# Patient Record
Sex: Female | Born: 1945 | Race: Black or African American | Hispanic: No | Smoking: Never smoker
Health system: Southern US, Community
[De-identification: ages and names within clinical notes are randomized; demographics above are authoritative.]

## PROBLEM LIST (undated history)

## (undated) DIAGNOSIS — E785 Hyperlipidemia, unspecified: Secondary | ICD-10-CM

## (undated) DIAGNOSIS — E039 Hypothyroidism, unspecified: Secondary | ICD-10-CM

## (undated) DIAGNOSIS — M2550 Pain in unspecified joint: Secondary | ICD-10-CM

## (undated) DIAGNOSIS — M1990 Unspecified osteoarthritis, unspecified site: Secondary | ICD-10-CM

## (undated) DIAGNOSIS — K219 Gastro-esophageal reflux disease without esophagitis: Secondary | ICD-10-CM

## (undated) DIAGNOSIS — I10 Essential (primary) hypertension: Secondary | ICD-10-CM

## (undated) DIAGNOSIS — R51 Headache: Secondary | ICD-10-CM

## (undated) DIAGNOSIS — E079 Disorder of thyroid, unspecified: Secondary | ICD-10-CM

## (undated) HISTORY — PX: JOINT REPLACEMENT: SHX530

## (undated) HISTORY — DX: Disorder of thyroid, unspecified: E07.9

## (undated) HISTORY — PX: BREAST BIOPSY: SHX20

## (undated) HISTORY — PX: ABDOMINAL HYSTERECTOMY: SHX81

## (undated) HISTORY — PX: CATARACT EXTRACTION: SUR2

## (undated) HISTORY — PX: EYE SURGERY: SHX253

## (undated) HISTORY — PX: BREAST EXCISIONAL BIOPSY: SUR124

## (undated) NOTE — *Deleted (*Deleted)
[facility]  Name: Teresa Stanley  MRN: [id number] DOB: [birth year]  Chief Complaint/ HPI  Teresa Stanley,  7 y.o. , female presents for their Follow-Up CCM visit with the clinical pharmacist via telephone due to COVID-19 Pandemic.  PCP : Dorothyann Peng, MD  Their chronic conditions include: Hypertension, Diabetes mellitus with CKD  Office Visits: 09/30/19 Telephone call: Pt notified needles from PAP ready for pickup  08/23/19 Nurse visit: BP 148/86, pt states BP was 128/65 at home. Continue current regimen. Hydralazine with breakfast and dinner. Exercise  08/04/19 OV: Started hydralazine 25mg  twice daily for blood pressure. Increase gabapentin to 200mg  nightly. 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   07/18/19 Telephone call: Pt called with weekly BG readings (119,118, 112, 124, 134, 145). Pt's BG has improved and PCP told pt she does not need to call weekly anymore.   07/08/19 Telephone call: Pt called with weekly BG readings (149, 130, 134, 179, 140, 145, 143, 145) on 14 units of [place]. Pt instructed to increase to 16 units daily.   06/16/19 OV: Presented for full physical exam and DM and HTN follow up. Diabetic foot exam performed. Shingles vaccine declined at this time.   03/17/19 AWV and OV: HTN/DM follow up visit. Both conditions are chronic and fairly controlled on current medications. Labs ordered (CMP14+EGFR, lipid panel, HgbA1c). Pt will need to be started on insulin due to increase in A1c.   02/16/19 Televisit: Complains of symptoms concerning for COVID-19 (cough, sinus congestion). COVID test last Saturday was negative. Try loratadine 10mg  daily for postnasal drip.   Consult Visits:  CCM Encounters: 10/05/19 RN: reviewed BP and treatment plan  08/08/19 RN: Reviewed hypertension treatment regimen. Provided patient education  05/04/19 PharmD: Increasing Evaristo Bury to 10 units at bedtime.   04/25/19 RN: Provided patient education regarding chronic disease states  and treatment regimens  04/14/19 PharmD: Jaquita Folds Evaristo Bury to 8 units at bedtime  04/14/19 CPhT: Evaristo Bury approved through [employer] until 02/10/20  03/30/19 CPhT: completed application for Tresiba faxed to [employer]  03/28/19 PharmD: Adding Evaristo Bury 6 units at bedtime due to increase of A1c to 9.6. Add Evaristo Bury to pt assistance and counsel on proper use.   02/23/19 CPhT: Approved for Ozempic through [employer] until 01/10/20  Medications: Outpatient Encounter Medications as of 12/15/2019  Medication Sig  . amLODipine (NORVASC) 10 MG tablet TAKE 1 TABLET BY MOUTH EVERY DAY IN THE EVENING  . aspirin 81 MG tablet Take 81 mg by mouth daily.  [name] atorvastatin (LIPITOR) 20 MG tablet TAKE 1 TABLET BY MOUTH EVERY DAY  . Blood Glucose Monitoring Suppl (ONE TOUCH ULTRA 2) w/Device KIT Use as directed to check blood sugars 2 times per day dx:e11.22  . carboxymethylcellulose (REFRESH PLUS) 0.5 % SOLN Place 1 drop into both eyes daily.  . chlorthalidone (HYGROTON) 25 MG tablet Take 25 mg by mouth daily.  . cholecalciferol (VITAMIN D3) 25 MCG (1000 UNIT) tablet Take 1,000 Units by mouth daily.  . famotidine (PEPCID) 20 MG tablet Take 20 mg by mouth daily.  [name] gabapentin (NEURONTIN) 100 MG capsule TAKE 2 CAPSULES BY MOUTH AT BEDTIME  . hydrALAZINE (APRESOLINE) 25 MG tablet TAKE 1 TABLET BY MOUTH TWICE A DAY  . insulin degludec (TRESIBA FLEXTOUCH) 100 UNIT/ML SOPN FlexTouch Pen Inject 16 Units into the skin at bedtime.   . meclizine (ANTIVERT) 25 MG tablet TAKE 1 TABLET (25 MG TOTAL) BY MOUTH 3 (THREE) TIMES DAILY AS NEEDED FOR DIZZINESS.  [name]  methimazole (TAPAZOLE) 5 MG tablet TAKE 1 TABLET BY MOUTH EVERY DAY  . omeprazole (PRILOSEC) 20 MG capsule TAKE ONE CAPSULE BY MOUTH BEFORE A MEAL (Patient not taking: Reported on 10/12/2019)  . OneTouch Delica Lancets 33G MISC Use as directed to check blood sugars 2 times per day dx: e11.22  . ONETOUCH ULTRA test strip USE AS INSTRUCTED TO CHECK BLOOD SUGARS 2 TIMES PER  DAY DX: E11.22  . propranolol ER (INDERAL LA) 120 MG 24 hr capsule Take 1 capsule (120 mg total) by mouth daily.  . Semaglutide,0.25 or 0.5MG /DOS, (OZEMPIC, 0.25 OR 0.5 MG/DOSE,) 2 MG/1.5ML SOPN Inject 0.5 mg into the skin once a week. (Patient taking differently: Inject 0.5 mg into the skin once a week. Pt states she takes 0.25 on Wednesday and 0.5 on Sunday)  . triamcinolone cream (KENALOG) 0.1 % Apply 1 application topically 2 (two) times daily. Apply (Patient not taking: Reported on 08/29/2019)  . triamterene-hydrochlorothiazide (MAXZIDE-25) 37.5-25 MG tablet TAKE 1 TABLET BY MOUTH EVERY DAY IN THE MORNING (Patient not taking: Reported on 10/12/2019)   No facility-administered encounter medications on file as of 12/15/2019.    Current Diagnosis/Assessment:    Goals Addressed   None     Diabetes   Recent Relevant Labs: Lab Results  Component Value Date/Time   HGBA1C 8.9 (H) 06/16/2019 05:02 PM   HGBA1C 9.7 (H) 03/17/2019 10:41 AM   MICROALBUR 80 06/16/2019 02:39 PM   MICROALBUR 150 03/17/2019 11:53 AM    Checking BG: Daily  Recent FBG Readings:  Recent pre-meal BG readings:  Recent 2hr PP BG readings:   Recent HS BG readings:  Patient has failed these meds in past: Glipizide, Victoza, Metformin, pioglitazone Patient is currently uncontrolled on the following medications:   Ozempic 0.5mg  once weekly  0.25mg  weekly on Wednesdays, 0.5mg  weekly on Sundays  Tresiba 16 units daily  Aspirin 81mg  daily  Last diabetic Foot exam: 12/14/18 Last diabetic Eye exam:  Lab Results  Component Value Date/Time   HMDIABEYEEXA No Retinopathy 04/01/2019 12:00 AM    We discussed:  Discussed patient assistance application resent for Ozempic due to [employer] stating original application was cut off before prescriber signature  Pt states that she has 2 doses left  Advised pt to contact PharmD or office for samples before she runs out  Discussed that Ozempic coming from [employer]  will be for the 1mg  injection to be done once a week  Pt states she picked up [place] patient assistance shipment last week . Diet extensively o Pt states she has cut back on starches, bread, sweets, etc o Pt states she is going to start drinking 4 bottles of water daily (has been drinking water though) . Exercise extensively o Walking and using hands weights o Trying to exercise every morning for about 30 minutes o Recommend pt get 30 minutes of moderate intensity exercise daily 5 times a week (150 minutes total per week)  Plan Continue current medications  Follow up on status of Ozempic 1mg  refill with Novo Nordisk   Hypertension   Office blood pressures are  BP Readings from Last 3 Encounters:  08/23/19 (!) 148/86  08/04/19 (!) 160/92  06/16/19 (!) 172/96   Patient has failed these meds in the past: Chlorthalidone, Clonidine, Lisinopril, Metoprolol tartrate,  Patient is currently uncontrolled on the following medications:   Amlodipine 10mg  daily  Triamterene/ HCTZ 37.5mg /25mg  daily  Propranolol ER 120mg  daily  Hydralazine 25mg  twice daily  Patient checks BP at home daily  Patient home BP readings are ranging: 124/50  We discussed:   Pt states that triamterene/HCTZ was stopped and changed to something else yesterday by Nephrologist  After chart review appears that patient was changed to chlorthalidone 25mg   Pt is going to pick up new medication and start today  Will follow up with Nephrologist in 2 months . Pt states potassium was high (she was also prescribed a medication for that and will have follow up labwork done on Friday) . Discussed triamterene was most likely stopped because it can cause hyperkalemia  Plan Continue current medications   Hyperlipidemia   LDL goal < 70  Lipid Panel     Component Value Date/Time   CHOL 162 03/17/2019 1041   TRIG 124 03/17/2019 1041   HDL 60 03/17/2019 1041   LDLCALC 80 03/17/2019 1041    Hepatic Function Latest  Ref Rng & Units 06/16/2019 03/17/2019 06/02/2018  Total Protein 6.0 - 8.5 g/dL 7.4 7.1 6.7  Albumin 3.7 - 4.7 g/dL - 4.3 -  AST 0 - 40 IU/L - 15 -  ALT 0 - 32 IU/L - 10 -  Alk Phosphatase 39 - 117 IU/L - 175(H) -  Total Bilirubin 0.0 - 1.2 mg/dL - 0.4 -    The 09-FGHW ASCVD risk score Denman George DC Jr., et al., 2013) is: 32%   Values used to calculate the score:     Age: 6 years     Sex: Female     Is Non-Hispanic African American: Yes     Diabetic: Yes     Tobacco smoker: No     Systolic Blood Pressure: 148 mmHg     Is BP treated: Yes     HDL Cholesterol: 60 mg/dL     Total Cholesterol: 162 mg/dL   Patient has failed these meds in past: N/A Patient is currently uncontrolled on the following medications:  . Atorvastatin 20mg  daily  We discussed: Diet and exercise extensively  LDL above goal of 70 and HDL below goal of 50  Plan Continue current medications along with lifestyle modifications  Hyperthyroidism   Lab Results  Component Value Date/Time   TSH 0.005 (L) 08/23/2009 07:54 PM   TSH 0.022 (L) 11/14/2008 08:42 PM   FREET4 1.33 08/23/2009 07:54 PM   FREET4 1.32 11/14/2008 08:42 PM   Patient has failed these meds in past: N/A Patient is currently controlled on the following medications:  [name] Methimazole 5mg  daily  Plan Continue current medications   Diabetic Neuropathy   Patient has failed these meds in past: N/A Patient is currently controlled on the following medications:   Gabapentin 100mg  2 capsules nightly  We discussed:  Gabapentin has helped with nerve pain  Pt has skipped a dose or two  Advised her that if she skips an occasional dose she may not notice any effect, but if she misses several doses she will notice the effect  Plan Continue current medications  Vaccines   Reviewed and discussed patient's vaccination history.    Immunization History  Administered Date(s) Administered  . Influenza, High Dose Seasonal PF 11/12/2017, 11/09/2018,  11/22/2019  . PFIZER SARS-COV-2 Vaccination 04/04/2019, 04/25/2019, 12/07/2019  . Pneumococcal Polysaccharide-23 11/29/2015   Discussed the importance of vaccinations (specifically pneumonia and shingles)  Plan Recommended patient receive Prevnar13 and Shingrix vaccines in pharmacy.   Medication Management   Pt uses [facility] for all medications Uses pill box? Yes Pt endorses 100% compliance  We discussed:   Importance of daily medication adherence  Medication synchronization, adherence packaging, or delivery with [employer]  Not interested right now  Plan Continue current medication management strategy   Follow up:   Willa Frater, PharmD Clinical Pharmacist [facility] [phone]

---

## 1999-05-31 ENCOUNTER — Emergency Department (HOSPITAL_COMMUNITY): Admission: EM | Admit: 1999-05-31 | Discharge: 1999-05-31 | Payer: Self-pay | Admitting: Emergency Medicine

## 2000-03-18 ENCOUNTER — Emergency Department (HOSPITAL_COMMUNITY): Admission: EM | Admit: 2000-03-18 | Discharge: 2000-03-18 | Payer: Self-pay | Admitting: Otolaryngology

## 2001-05-11 ENCOUNTER — Emergency Department (HOSPITAL_COMMUNITY): Admission: EM | Admit: 2001-05-11 | Discharge: 2001-05-11 | Payer: Self-pay

## 2001-05-27 ENCOUNTER — Encounter: Payer: Self-pay | Admitting: Obstetrics & Gynecology

## 2001-05-27 ENCOUNTER — Encounter: Admission: RE | Admit: 2001-05-27 | Discharge: 2001-05-27 | Payer: Self-pay | Admitting: Obstetrics & Gynecology

## 2001-05-27 ENCOUNTER — Other Ambulatory Visit: Admission: RE | Admit: 2001-05-27 | Discharge: 2001-05-27 | Payer: Self-pay | Admitting: Obstetrics & Gynecology

## 2001-06-14 ENCOUNTER — Encounter: Payer: Self-pay | Admitting: General Surgery

## 2001-06-14 ENCOUNTER — Encounter: Admission: RE | Admit: 2001-06-14 | Discharge: 2001-06-14 | Payer: Self-pay | Admitting: General Surgery

## 2001-06-15 ENCOUNTER — Ambulatory Visit (HOSPITAL_BASED_OUTPATIENT_CLINIC_OR_DEPARTMENT_OTHER): Admission: RE | Admit: 2001-06-15 | Discharge: 2001-06-15 | Payer: Self-pay | Admitting: General Surgery

## 2001-07-05 ENCOUNTER — Emergency Department (HOSPITAL_COMMUNITY): Admission: EM | Admit: 2001-07-05 | Discharge: 2001-07-05 | Payer: Self-pay | Admitting: *Deleted

## 2002-05-30 ENCOUNTER — Encounter: Payer: Self-pay | Admitting: Obstetrics & Gynecology

## 2002-05-30 ENCOUNTER — Encounter: Admission: RE | Admit: 2002-05-30 | Discharge: 2002-05-30 | Payer: Self-pay | Admitting: Obstetrics & Gynecology

## 2002-06-07 ENCOUNTER — Encounter: Payer: Self-pay | Admitting: Obstetrics & Gynecology

## 2002-06-07 ENCOUNTER — Encounter: Admission: RE | Admit: 2002-06-07 | Discharge: 2002-06-07 | Payer: Self-pay | Admitting: Obstetrics & Gynecology

## 2003-03-14 ENCOUNTER — Encounter: Admission: RE | Admit: 2003-03-14 | Discharge: 2003-03-14 | Payer: Self-pay | Admitting: Obstetrics & Gynecology

## 2003-05-16 ENCOUNTER — Emergency Department (HOSPITAL_COMMUNITY): Admission: EM | Admit: 2003-05-16 | Discharge: 2003-05-17 | Payer: Self-pay | Admitting: Emergency Medicine

## 2003-05-23 ENCOUNTER — Other Ambulatory Visit: Admission: RE | Admit: 2003-05-23 | Discharge: 2003-05-23 | Payer: Self-pay | Admitting: Obstetrics & Gynecology

## 2003-06-14 ENCOUNTER — Encounter: Admission: RE | Admit: 2003-06-14 | Discharge: 2003-06-14 | Payer: Self-pay | Admitting: Obstetrics & Gynecology

## 2003-06-22 ENCOUNTER — Inpatient Hospital Stay (HOSPITAL_COMMUNITY): Admission: RE | Admit: 2003-06-22 | Discharge: 2003-06-25 | Payer: Self-pay | Admitting: Obstetrics & Gynecology

## 2003-07-25 ENCOUNTER — Ambulatory Visit: Admission: RE | Admit: 2003-07-25 | Discharge: 2003-09-18 | Payer: Self-pay | Admitting: *Deleted

## 2003-12-29 ENCOUNTER — Encounter: Admission: RE | Admit: 2003-12-29 | Discharge: 2003-12-29 | Payer: Self-pay | Admitting: Family Medicine

## 2004-01-09 ENCOUNTER — Encounter: Admission: RE | Admit: 2004-01-09 | Discharge: 2004-01-09 | Payer: Self-pay | Admitting: Family Medicine

## 2004-02-16 ENCOUNTER — Emergency Department (HOSPITAL_COMMUNITY): Admission: EM | Admit: 2004-02-16 | Discharge: 2004-02-16 | Payer: Self-pay | Admitting: Emergency Medicine

## 2004-08-20 ENCOUNTER — Emergency Department (HOSPITAL_COMMUNITY): Admission: EM | Admit: 2004-08-20 | Discharge: 2004-08-20 | Payer: Self-pay | Admitting: Emergency Medicine

## 2004-12-30 ENCOUNTER — Ambulatory Visit: Payer: Self-pay | Admitting: Internal Medicine

## 2005-01-07 ENCOUNTER — Ambulatory Visit: Payer: Self-pay | Admitting: *Deleted

## 2005-01-27 ENCOUNTER — Ambulatory Visit: Payer: Self-pay | Admitting: Internal Medicine

## 2005-01-29 ENCOUNTER — Encounter: Admission: RE | Admit: 2005-01-29 | Discharge: 2005-01-29 | Payer: Self-pay | Admitting: Family Medicine

## 2005-02-06 ENCOUNTER — Encounter: Admission: RE | Admit: 2005-02-06 | Discharge: 2005-02-06 | Payer: Self-pay | Admitting: Internal Medicine

## 2005-03-21 ENCOUNTER — Ambulatory Visit: Payer: Self-pay | Admitting: Internal Medicine

## 2005-04-07 ENCOUNTER — Ambulatory Visit: Payer: Self-pay | Admitting: Internal Medicine

## 2005-05-21 ENCOUNTER — Ambulatory Visit: Payer: Self-pay | Admitting: Internal Medicine

## 2005-07-31 ENCOUNTER — Ambulatory Visit: Payer: Self-pay | Admitting: Internal Medicine

## 2005-09-03 ENCOUNTER — Ambulatory Visit: Payer: Self-pay | Admitting: Internal Medicine

## 2005-09-04 ENCOUNTER — Ambulatory Visit: Payer: Self-pay | Admitting: Internal Medicine

## 2005-10-21 ENCOUNTER — Ambulatory Visit: Payer: Self-pay | Admitting: Internal Medicine

## 2005-11-19 ENCOUNTER — Ambulatory Visit: Payer: Self-pay | Admitting: Nurse Practitioner

## 2006-02-06 ENCOUNTER — Encounter: Admission: RE | Admit: 2006-02-06 | Discharge: 2006-02-06 | Payer: Self-pay | Admitting: Internal Medicine

## 2006-03-03 ENCOUNTER — Ambulatory Visit: Payer: Self-pay | Admitting: Internal Medicine

## 2006-03-31 ENCOUNTER — Ambulatory Visit: Payer: Self-pay | Admitting: Internal Medicine

## 2006-04-30 ENCOUNTER — Encounter: Admission: RE | Admit: 2006-04-30 | Discharge: 2006-04-30 | Payer: Self-pay | Admitting: Internal Medicine

## 2006-07-23 ENCOUNTER — Ambulatory Visit: Payer: Self-pay | Admitting: Internal Medicine

## 2006-10-28 ENCOUNTER — Encounter (INDEPENDENT_AMBULATORY_CARE_PROVIDER_SITE_OTHER): Payer: Self-pay | Admitting: *Deleted

## 2006-12-17 ENCOUNTER — Ambulatory Visit: Payer: Self-pay | Admitting: Internal Medicine

## 2006-12-17 LAB — CONVERTED CEMR LAB
Alkaline Phosphatase: 101 units/L (ref 39–117)
BUN: 19 mg/dL (ref 6–23)
Glucose, Bld: 76 mg/dL (ref 70–99)
HDL: 58 mg/dL (ref >39)
LDL Cholesterol: 69 mg/dL (ref 0–99)
Sodium: 144 meq/L (ref 135–145)
Total Bilirubin: 0.4 mg/dL (ref 0.3–1.2)
Triglycerides: 105 mg/dL (ref <150)
VLDL: 21 mg/dL (ref 0–40)

## 2007-02-08 ENCOUNTER — Encounter: Admission: RE | Admit: 2007-02-08 | Discharge: 2007-02-08 | Payer: Self-pay | Admitting: Family Medicine

## 2007-02-11 HISTORY — PX: CARDIAC CATHETERIZATION: SHX172

## 2007-03-24 ENCOUNTER — Ambulatory Visit: Payer: Self-pay | Admitting: Internal Medicine

## 2007-04-28 ENCOUNTER — Ambulatory Visit: Payer: Self-pay | Admitting: Internal Medicine

## 2007-05-25 ENCOUNTER — Ambulatory Visit: Payer: Self-pay | Admitting: Family Medicine

## 2007-07-05 ENCOUNTER — Emergency Department (HOSPITAL_COMMUNITY): Admission: EM | Admit: 2007-07-05 | Discharge: 2007-07-05 | Payer: Self-pay | Admitting: Emergency Medicine

## 2007-10-08 ENCOUNTER — Ambulatory Visit: Payer: Self-pay | Admitting: Internal Medicine

## 2007-10-14 ENCOUNTER — Ambulatory Visit: Payer: Self-pay | Admitting: Internal Medicine

## 2007-10-14 LAB — CONVERTED CEMR LAB
AST: 12 units/L (ref 0–37)
Albumin: 4.1 g/dL (ref 3.5–5.2)
Alkaline Phosphatase: 93 units/L (ref 39–117)
BUN: 21 mg/dL (ref 6–23)
Cholesterol: 143 mg/dL (ref 0–200)
Creatinine, Ser: 0.9 mg/dL (ref 0.40–1.20)
Total Protein: 7.2 g/dL (ref 6.0–8.3)
Triglycerides: 60 mg/dL (ref <150)
VLDL: 12 mg/dL (ref 0–40)

## 2007-10-27 ENCOUNTER — Ambulatory Visit: Payer: Self-pay | Admitting: Internal Medicine

## 2008-01-27 ENCOUNTER — Inpatient Hospital Stay (HOSPITAL_COMMUNITY): Admission: AD | Admit: 2008-01-27 | Discharge: 2008-01-29 | Payer: Self-pay | Admitting: Cardiovascular Disease

## 2008-01-27 ENCOUNTER — Encounter: Payer: Self-pay | Admitting: Emergency Medicine

## 2008-02-09 ENCOUNTER — Encounter: Admission: RE | Admit: 2008-02-09 | Discharge: 2008-02-09 | Payer: Self-pay | Admitting: Internal Medicine

## 2008-02-18 ENCOUNTER — Encounter: Admission: RE | Admit: 2008-02-18 | Discharge: 2008-02-18 | Payer: Self-pay | Admitting: Internal Medicine

## 2008-02-22 ENCOUNTER — Ambulatory Visit: Payer: Self-pay | Admitting: Internal Medicine

## 2008-03-28 ENCOUNTER — Ambulatory Visit: Payer: Self-pay | Admitting: Internal Medicine

## 2008-06-28 ENCOUNTER — Ambulatory Visit: Payer: Self-pay | Admitting: Internal Medicine

## 2008-08-30 ENCOUNTER — Ambulatory Visit: Payer: Self-pay | Admitting: Internal Medicine

## 2008-08-31 ENCOUNTER — Encounter (INDEPENDENT_AMBULATORY_CARE_PROVIDER_SITE_OTHER): Payer: Self-pay | Admitting: Internal Medicine

## 2008-08-31 LAB — CONVERTED CEMR LAB
ALT: 11 units/L (ref 0–35)
CO2: 23 meq/L (ref 19–32)
Calcium: 9.8 mg/dL (ref 8.4–10.5)
Chloride: 103 meq/L (ref 96–112)
Eosinophils Absolute: 0.1 10*3/uL (ref 0.0–0.7)
Free T4: 0.95 ng/dL (ref 0.80–1.80)
Glucose, Bld: 196 mg/dL — ABNORMAL HIGH (ref 70–99)
Hemoglobin: 12.4 g/dL (ref 12.0–15.0)
Lymphs Abs: 1.8 10*3/uL (ref 0.7–4.0)
MCHC: 31.5 g/dL (ref 30.0–36.0)
MCV: 90.2 fL (ref 78.0–100.0)
Monocytes Absolute: 0.5 10*3/uL (ref 0.1–1.0)
Monocytes Relative: 6 % (ref 3–12)
Neutro Abs: 6.5 10*3/uL (ref 1.7–7.7)
Neutrophils Relative %: 73 % (ref 43–77)
RBC: 4.37 M/uL (ref 3.87–5.11)
Sodium: 141 meq/L (ref 135–145)
TSH: 0.041 microintl units/mL — ABNORMAL LOW (ref 0.350–4.500)
Total Protein: 7.1 g/dL (ref 6.0–8.3)
WBC: 8.9 10*3/uL (ref 4.0–10.5)

## 2008-09-18 ENCOUNTER — Ambulatory Visit (HOSPITAL_COMMUNITY): Admission: RE | Admit: 2008-09-18 | Discharge: 2008-09-18 | Payer: Self-pay | Admitting: Internal Medicine

## 2008-10-10 ENCOUNTER — Encounter (INDEPENDENT_AMBULATORY_CARE_PROVIDER_SITE_OTHER): Payer: Self-pay | Admitting: Interventional Radiology

## 2008-10-10 ENCOUNTER — Encounter: Admission: RE | Admit: 2008-10-10 | Discharge: 2008-10-10 | Payer: Self-pay | Admitting: Internal Medicine

## 2008-10-10 ENCOUNTER — Other Ambulatory Visit: Admission: RE | Admit: 2008-10-10 | Discharge: 2008-10-10 | Payer: Self-pay | Admitting: Interventional Radiology

## 2008-11-14 ENCOUNTER — Ambulatory Visit: Payer: Self-pay | Admitting: Internal Medicine

## 2008-11-14 LAB — CONVERTED CEMR LAB
Alkaline Phosphatase: 99 units/L (ref 39–117)
BUN: 17 mg/dL (ref 6–23)
Creatinine, Ser: 0.75 mg/dL (ref 0.40–1.20)
Free T4: 1.32 ng/dL (ref 0.80–1.80)
Glucose, Bld: 109 mg/dL — ABNORMAL HIGH (ref 70–99)
T3, Total: 151.9 ng/dL (ref 80.0–204.0)
Total Bilirubin: 0.4 mg/dL (ref 0.3–1.2)

## 2009-01-09 ENCOUNTER — Ambulatory Visit: Payer: Self-pay | Admitting: Internal Medicine

## 2009-01-30 ENCOUNTER — Encounter (HOSPITAL_COMMUNITY): Admission: RE | Admit: 2009-01-30 | Discharge: 2009-02-09 | Payer: Self-pay | Admitting: Internal Medicine

## 2009-03-01 ENCOUNTER — Encounter: Admission: RE | Admit: 2009-03-01 | Discharge: 2009-03-01 | Payer: Self-pay | Admitting: Internal Medicine

## 2009-03-30 ENCOUNTER — Ambulatory Visit: Payer: Self-pay | Admitting: Internal Medicine

## 2009-04-20 ENCOUNTER — Encounter: Admission: RE | Admit: 2009-04-20 | Discharge: 2009-04-20 | Payer: Self-pay | Admitting: Surgery

## 2009-08-23 ENCOUNTER — Ambulatory Visit: Payer: Self-pay | Admitting: Internal Medicine

## 2009-08-23 LAB — CONVERTED CEMR LAB
Free T4: 1.33 ng/dL (ref 0.80–1.80)
Hgb A1c MFr Bld: 8.2 % — ABNORMAL HIGH (ref <5.7)
TSH: 0.005 microintl units/mL — ABNORMAL LOW (ref 0.350–4.500)

## 2009-12-01 IMAGING — CR DG CHEST 1V PORT
1 series · 1 of 1 positions shown · non-contrast
Comparison: 06/16/2003

CLINICAL DATA: Chest pain

PORTABLE CHEST - 1 VIEW

[view not recorded]
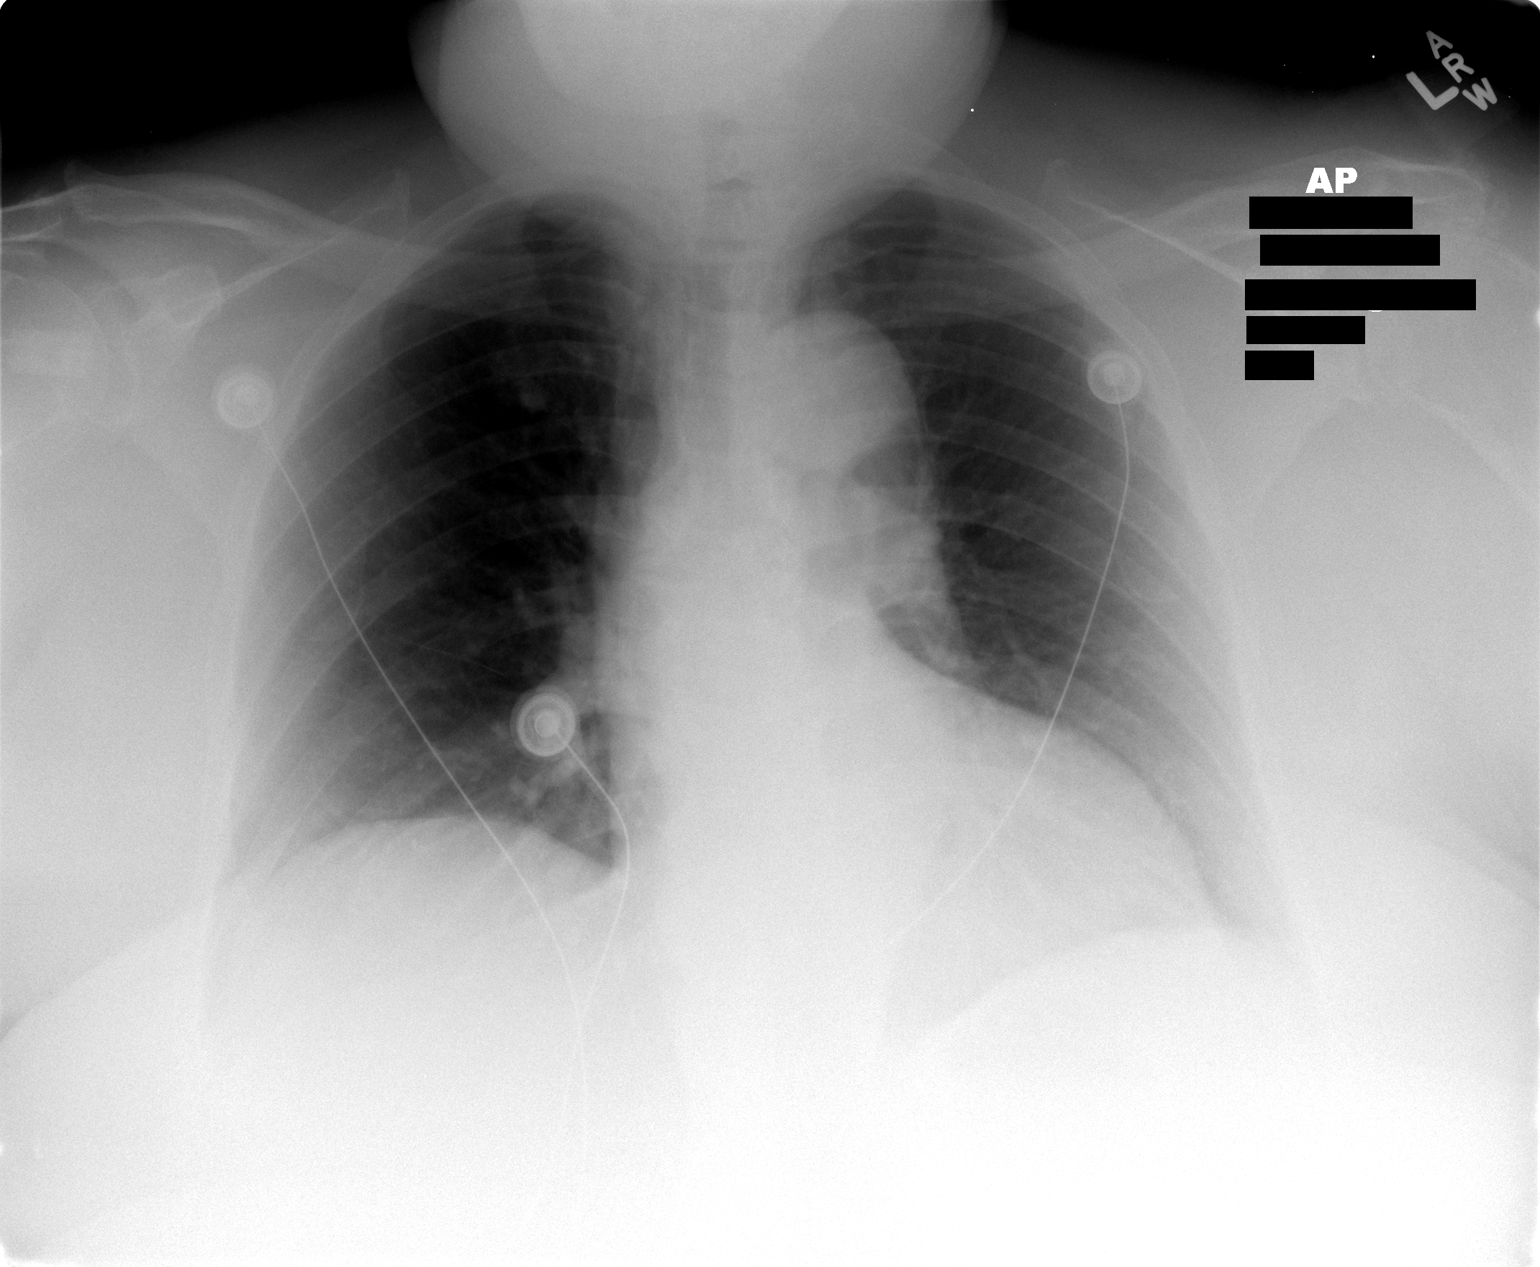

[1 of 1 positions shown; findings below may reference images not displayed]

FINDINGS: Heart size within normal limits.  Prominent left
ventricular contour and tortuous aorta.  No congestive heart
failure or active disease in one-view.
IMPRESSION: Prominent left ventricular contour and tortuous aorta - no active
disease.

## 2010-01-29 ENCOUNTER — Encounter (INDEPENDENT_AMBULATORY_CARE_PROVIDER_SITE_OTHER): Payer: Self-pay | Admitting: Family Medicine

## 2010-01-29 LAB — CONVERTED CEMR LAB
ALT: 16 units/L (ref 0–35)
AST: 18 units/L (ref 0–37)
Alkaline Phosphatase: 97 units/L (ref 39–117)
CO2: 23 meq/L (ref 19–32)
Cholesterol: 131 mg/dL (ref 0–200)
Creatinine, Ser: 0.93 mg/dL (ref 0.40–1.20)
Eosinophils Absolute: 0.1 10*3/uL (ref 0.0–0.7)
Eosinophils Relative: 2 % (ref 0–5)
HCT: 38.7 % (ref 36.0–46.0)
LDL Cholesterol: 62 mg/dL (ref 0–99)
Lymphs Abs: 2 10*3/uL (ref 0.7–4.0)
MCV: 89.6 fL (ref 78.0–100.0)
Monocytes Relative: 6 % (ref 3–12)
Platelets: 286 10*3/uL (ref 150–400)
Total Bilirubin: 0.4 mg/dL (ref 0.3–1.2)
VLDL: 15 mg/dL (ref 0–40)
WBC: 8.2 10*3/uL (ref 4.0–10.5)

## 2010-03-03 ENCOUNTER — Encounter: Payer: Self-pay | Admitting: Internal Medicine

## 2010-03-11 ENCOUNTER — Encounter
Admission: RE | Admit: 2010-03-11 | Discharge: 2010-03-11 | Payer: Self-pay | Source: Home / Self Care | Attending: Family Medicine | Admitting: Family Medicine

## 2010-03-12 ENCOUNTER — Other Ambulatory Visit: Payer: Self-pay | Admitting: Family Medicine

## 2010-03-12 DIAGNOSIS — R928 Other abnormal and inconclusive findings on diagnostic imaging of breast: Secondary | ICD-10-CM

## 2010-03-13 ENCOUNTER — Encounter: Payer: Self-pay | Admitting: Internal Medicine

## 2010-03-15 ENCOUNTER — Ambulatory Visit
Admission: RE | Admit: 2010-03-15 | Discharge: 2010-03-15 | Disposition: A | Discharge status: Home / Self Care | Payer: MEDICARE | Source: Ambulatory Visit | Attending: Diagnostic Radiology | Admitting: Diagnostic Radiology

## 2010-03-15 DIAGNOSIS — R928 Other abnormal and inconclusive findings on diagnostic imaging of breast: Secondary | ICD-10-CM

## 2010-06-25 NOTE — Discharge Summary (Signed)
NAMEMARLENY, FALLER                 ACCOUNT NO.:  [id number]   MEDICAL RECORD NO.:  [id number]          PATIENT TYPE:  INP   LOCATION:  6524                         FACILITY:  [facility]   PHYSICIAN:  Sheliah Mends, MD      DATE OF BIRTH:  Jun 12, 1945   DATE OF ADMISSION:  01/27/2008  DATE OF DISCHARGE:  01/29/2008                               DISCHARGE SUMMARY   ADDENDUM   DISCHARGE MEDICATIONS:  1. Aspirin 81 mg a day.  2. Actos 15 mg a day.  3. Lisinopril/HCTZ 20/25 every day.  4. Norvasc 10 mg every day.  5. Pravastatin 40 mg every day.  6. Metformin 1000 mg 2 times a day, but she will hold this medication      for 2 days because she is post cardiac catheterization.  7. Protonix 40 mg every day.  8. Glucotrol XL 100 mg every day.  9. Lantus insulin 25 units subcu at bedtime daily.  10.Cozaar 100 mg daily.      Lezlie Octave, N.P.      Sheliah Mends, MD  Electronically Signed    BB/MEDQ  D:  01/29/2008  T:  01/29/2008  Job:  [id number]

## 2010-06-25 NOTE — Cardiovascular Report (Signed)
[name], Teresa Stanley                 ACCOUNT NO.:  [id number]   MEDICAL RECORD NO.:  [id number]          PATIENT TYPE:  INP   LOCATION:  6524                         FACILITY:  [facility]   PHYSICIAN:  Sheliah Mends, MD      DATE OF BIRTH:  02/25/1945   DATE OF PROCEDURE:  01/28/2008  DATE OF DISCHARGE:                            CARDIAC CATHETERIZATION   CARDIOLOGISTS:  1. Madaline Savage, MD  2. Sheliah Mends, MD   INDICATIONS:  This is a 65 year old lady who presented with new-onset of  chest pain and multiple risk factors for coronary artery disease  including morbid obesity, type 2 diabetes mellitus, hypertension, and  dyslipidemia.  Because of the severity of her symptoms in context of her  multiple risk factors, a decision was made to schedule her for cardiac  catheterization.  The patient agreed to proceed with cardiac  catheterization.  She understands the nature of the procedure as well as  risk and benefits.   PROCEDURE:  After informed consent was obtained, the patient was started  on conscious sedation using Versed.  Right femoral artery access was  established with a 6-French sheath using the Seldinger technique.  The  left coronary symptom was visualized using a [employer] 6-  French catheter, the right coronary artery system was visualized with a  Judkins 6-French right catheter.   HEMODYNAMICS:  Aortic pressure 144/89 with a mean of 115, and LV  pressure of 147/6 with a mean of 17.   CORONARY ANGIOGRAPHY:  Revealed a large caliber short left main that  trifurcates into LAD, ramus intermedius, and left circumflex artery.  The left anterior descending artery is a large, medium-sized vessel that  wraps around the apex and gives off two-diagonal arteries.  There is no  angiographically significant coronary artery disease noted.  The ramus  intermedius is a large, medium caliber vessel without angiographically  significant coronary artery disease.  The left  circumflex artery gives  off 1 obtuse marginal branch and no angiographically significant disease  is noted.   The right coronary artery is a medium-sized vessel giving off PDA  branch.  There is no angiographically significant disease noted.   The procedure was completed without complications.   RECOMMENDATIONS:  [name] coronary angiogram revealed no evidence  of angiographically significant coronary artery disease.  She has normal  left ventricular function with an estimated ejection fraction of 65%.  Given her multiple risk factors including hypertension, type 2 diabetes  mellitus, and dyslipidemia as well as morbid  obesity, she will benefit from aggressive risk factor management.  In  addition, given her history of morbid obesity, she will benefit from  aggressive weight loss management and possibly a sleep study.   Dr. Chanda Busing was present throughout the procedure.      Sheliah Mends, MD  Electronically Signed     JE/MEDQ  D:  01/28/2008  T:  01/29/2008  Job:  [id number]

## 2010-06-25 NOTE — H&P (Signed)
Teresa Stanley, MARSTON NO.:  [id number]   MEDICAL RECORD NO.:  [id number]          PATIENT TYPE:  EMS   LOCATION:  ED                           FACILITY[facility]  [facility]   PHYSICIAN:  Elliot Cousin, M.D.    DATE OF BIRTH:  1945/02/24   DATE OF ADMISSION:  01/27/2008  DATE OF DISCHARGE:                              HISTORY & PHYSICAL   PRIMARY CARE PHYSICIAN:  Dr. Donia Guiles, [facility].   CHIEF COMPLAINT:  Chest pain.   HISTORY OF PRESENT ILLNESS:  The patient is a 65 year old woman with a  past medical history significant for type 2 diabetes mellitus,  hypertension, and hyperlipidemia.  She presents to the emergency  department with a chief complaint of chest pain.  The chest pain  initially started approximately 4 days ago while sitting down at church.  She describes the pain as a discomfort and throbbing.  She was chest  pain free for several days following the initial discomfort until this  morning.  While she was changing her grandchild, she developed left-  sided chest pain.  She rated the pain as an 8/10 in intensity.  It was  throbbing.  She had no associated radiation, nausea, lightheadedness,  shortness of breath, or diaphoresis.  When she was evaluated in the  emergency department, she was given 1 sublingual nitroglycerin which  decreased her pain to a 5/10 in intensity.  She is virtually chest pain  free currently.   PAST MEDICAL HISTORY:  1. Hypertension.  2. Type 2 diabetes mellitus.  3. Hyperlipidemia.  4. 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  5. Status post breast biopsy, non-malignant pathology.   MEDICATIONS:  1. Lantus 25 units nightly.  2. Aspirin 81 mg daily.  3. Actos 15 mg daily.  4. Lisinopril/hydrochlorothiazide 20/25 mg daily.  5. Pravastatin 40 mg take 2 tablets nightly.  6. Metformin 1000 mg b.i.d.  7. Protonix 40 once daily.  8.  Glucotrol XL 10 mg daily.  9. Norvasc 10 mg daily.  10.Cozaar 100 mg daily.   ALLERGIES:  No known drug allergies.   SOCIAL HISTORY:  The patient is married.  [place]  She has 4 children.  She says that she is disabled from  diabetes mellitus.  She denies tobacco, alcohol and illicit drug use.   FAMILY HISTORY:  Her mother is 50 years of age and has hypertension and  type 2 diabetes mellitus.  Her father died at the age of 33.  Etiology  of his death is unclear.  However, her father did have a history of  asthma and possibly did have a heart attack.  She has 9 siblings and 1  out of the 9 has a history of coronary artery disease.   REVIEW OF SYSTEMS:  The patient's review of systems is positive for  degenerative joint pain in her lower back and legs.  Otherwise review of  systems is negative.   EXAM:  VITAL SIGNS:  Temperature 97.9, blood pressure  155/83, pulse 106,  respiratory rate 18, oxygen saturation 99% on 2L of nasal cannula  oxygen.  GENERAL:  The patient is a pleasant alert obese 65 year old [place]  American woman who is currently lying in bed in no acute distress.  HEENT:  Head is normocephalic nontraumatic.  Pupils equal, round,  reactive to light.  Extraocular muscles are intact.  Conjunctivae are  clear.  Sclerae are white.  Nasal mucosa is mildly dry.  No sinus  tenderness.  Oropharynx reveals a full set of dentures.  Mucous  membranes are moist.  No posterior exudates or erythema.  NECK:  Supple.  No adenopathy, no thyromegaly, no bruit or JVD.  LUNGS:  Clear to auscultation bilaterally.  HEART:  S1 S2 with mild tachycardia.  ABDOMEN:  Obese positive bowel sounds, soft, nontender, nondistended.  No hepatosplenomegaly, no masses palpated.  GU AND RECTAL:  Deferred.  EXTREMITIES:  Pedal pulses are palpable bilaterally.  No pretibial  edema.  No pedal edema.  NEUROLOGIC:  The patient is alert and oriented x3.  Cranial nerves II-   XII are intact.  Strength is 5/5 throughout.  Sensation is intact.   ADMISSION LABORATORIES:  EKG reveals sinus tachycardia with evidence of  PVCs and a heart rate of 125 beats per minute.  No obvious ST or T-wave  abnormalities.  WBC 8.8, hemoglobin 12.9, hematocrit 40.2, platelets  279,000.  CK-MB 2.2, troponin I less than 0.05, myoglobin 71.4.  Sodium  138, potassium 3.7, chloride 101, CO2 27, glucose 251, BUN 19,  creatinine 0.9, calcium 9.5, D-dimer 0.49 (0-0.48 within normal limits).  Urinalysis greater than 1000 glucose, negative nitrites, negative  leukocytes.  Repeat cardiac markers reveal a CK-MB of 1.2, troponin I of  less than 0.05 and myoglobin of 57.9.  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   ASSESSMENT:  1. Chest pain.  The patient's chest pain was somewhat relieved by 1      sublingual nitroglycerin.  She had no associated symptoms and      therefore her chest pain appears to be somewhat atypical.  The      patient does have cardiac risk factors and warrants admission for      further evaluation and management.  2. Sinus tachycardia with premature ventricular contractions per EKG.  3. Marginally elevated D-dimer.  CT angiogram of the chest was      essentially negative for pulmonary embolism.  4. Small pulmonary nodule.  The patient will need a followup CT scan      of the chest in 6 to 12 months.  5. Uncontrolled type 2 diabetes mellitus.  The patient's glucose is      currently 251.  6. Hypertension.  The patient's blood pressure is mildly to moderately      elevated currently.  She is chronically treated with multiple      antihypertensive medications.   PLAN:  1. The patient will be  admitted for further evaluation and management.  2. Will add small dosing of nitropaste.  Additional treatment will be      given with oxygen, p.r.n. morphine, prophylactic Lovenox, and      prophylactic Protonix.  3. Will add beta blockade therapy with Lopressor 12.5 mg b.i.d. and  titrate accordingly.  4. Will hold metformin because of the intravenous dye given with the      CT of the chest.  Will add sliding scale      insulin and continue Actos and Glucotrol.  5. For further evaluation, will check cardiac enzymes, fasting lipid      panel, TSH, and hemoglobin A1c.  6. Will consult cardiology.      Elliot Cousin, M.D.  Electronically Signed     DF/MEDQ  D:  01/27/2008  T:  01/27/2008  Job:  [id number]   cc:   Dineen Kid. Reche Dixon, M.D.  Fax: [phone]

## 2010-06-25 NOTE — Discharge Summary (Signed)
[name], WAHLER                 ACCOUNT NO.:  [id number]   MEDICAL RECORD NO.:  [id number]          PATIENT TYPE:  INP   LOCATION:  6524                         FACILITY:  [facility]   PHYSICIAN:  Sheliah Mends, MD      DATE OF BIRTH:  1945-11-09   DATE OF ADMISSION:  01/27/2008  DATE OF DISCHARGE:                               DISCHARGE SUMMARY   ADMISSION DIAGNOSES:  1. Chest pain.  2. Hypertension.  3. Dyslipidemia.  4. Type 2 diabetes mellitus.   HOSPITAL COURSE:  Ms. Teresa Stanley is a 65 year old lady who presented with  complaints of chest pain and chest pressure for 2 days prior to  admission.  She subsequently presented to the emergency department at  [facility] and was transferred for cardiac care to [facility].  Given the patient's severe symptoms, and her multiple risk  factors for coronary artery disease, she was scheduled for  coronary  angiography and left heart catheterization.  Surprisingly, the coronary  angiogram did not show angiographically significant coronary artery  disease.   The patient had episodes of sinus tachycardia and elevated blood  pressure throughout her hospital stay.  She received intermittently,  beta-blockers with good response.  She was asymptomatic from a cardiac  standpoint on the day of discharge.   DISCHARGE DIAGNOSES:  1. No evidence of angiographically significant coronary artery      disease.  2. Hypertension.  3. Dyslipidemia.  4. Type 2 diabetes mellitus.  5. Morbid obesity.   RECOMMENDATIONS:  I recommend that patient will follow up with Dr.  Allyne Gee or Dr. Renae Gloss for aggressive risk factor management, including  weight loss and management of her diabetes mellitus.  Ms. Theys will be  discharged on her outpatient medications without addition of a beta-  blocker.  However, should she continue to have episodes of sinus  tachycardia or uncontrolled hypertension on an outpatient basis, I feel  it would be  appropriate to add Coreg or Bystolic to her regimen.   In addition, Ms. Killam will be scheduled with Dr. Nanetta Batty or  myself for post cardiac catheterization and care 1 week after her  discharge.   DISCHARGE MEDICATIONS:  1. Aspirin 81 mg p.o. t.i.d.  2. Actos 15 mg p.o. t.i.d.  3. Lisinopril 20 mg p.o. t.i.d.  4. Norvasc 10 mg p.o. t.i.d.  5. Pravastatin 40 mg p.o. t.i.d.  6. Metformin 1000 mg p.o. b.i.d. (on hold for 2 days post cath).  7. Protonix 40 mg nightly.  8. Glucotrol XL 10 mg p.o. daily.   I discussed the findings of our workup with [name] and her family.  They acknowledged understanding and appeared to be quite motivated to  make aggressive lifestyle modifications.      Sheliah Mends, MD  Electronically Signed     JE/MEDQ  D:  01/29/2008  T:  01/29/2008  Job:  [id number]

## 2010-11-15 LAB — CARDIAC PANEL(CRET KIN+CKTOT+MB+TROPI)
CK, MB: 2.5 ng/mL (ref 0.3–4.0)
Relative Index: 2 (ref 0.0–2.5)
Total CK: 126 U/L (ref 7–177)
Total CK: 166 U/L (ref 7–177)
Troponin I: <0.01 ng/mL (ref 0.00–0.06)
Troponin I: <0.01 ng/mL (ref 0.00–0.06)

## 2010-11-15 LAB — URINALYSIS, ROUTINE W REFLEX MICROSCOPIC
Bilirubin Urine: NEGATIVE
Hgb urine dipstick: NEGATIVE
Nitrite: NEGATIVE
Specific Gravity, Urine: 1.018 (ref 1.005–1.030)
pH: 6 (ref 5.0–8.0)

## 2010-11-15 LAB — CBC
HCT: 40.2 % (ref 36.0–46.0)
HCT: 39.1 % (ref 36.0–46.0)
HCT: 39.4 % (ref 36.0–46.0)
Hemoglobin: 12.9 g/dL (ref 12.0–15.0)
Hemoglobin: 12.7 g/dL (ref 12.0–15.0)
Platelets: 279 10*3/uL (ref 150–400)
RBC: 4.48 MIL/uL (ref 3.87–5.11)
WBC: 8.8 10*3/uL (ref 4.0–10.5)
WBC: 7.3 10*3/uL (ref 4.0–10.5)
WBC: 7.9 10*3/uL (ref 4.0–10.5)

## 2010-11-15 LAB — GLUCOSE, CAPILLARY: Glucose-Capillary: 138 mg/dL — ABNORMAL HIGH (ref 70–99)

## 2010-11-15 LAB — LIPID PANEL
Cholesterol: 153 mg/dL (ref 0–200)
LDL Cholesterol: 80 mg/dL (ref 0–99)
Total CHOL/HDL Ratio: 2.4 RATIO
Triglycerides: 52 mg/dL (ref <150)
VLDL: 10 mg/dL (ref 0–40)

## 2010-11-15 LAB — COMPREHENSIVE METABOLIC PANEL
ALT: 17 U/L (ref 0–35)
AST: 17 U/L (ref 0–37)
Albumin: 3.6 g/dL (ref 3.5–5.2)
Alkaline Phosphatase: 83 U/L (ref 39–117)
Chloride: 98 mEq/L (ref 96–112)
GFR calc Af Amer: >60 mL/min (ref >60)
Potassium: 3.4 mEq/L — ABNORMAL LOW (ref 3.5–5.1)
Sodium: 138 mEq/L (ref 135–145)
Total Bilirubin: 0.8 mg/dL (ref 0.3–1.2)
Total Protein: 6.8 g/dL (ref 6.0–8.3)

## 2010-11-15 LAB — POCT CARDIAC MARKERS
Myoglobin, poc: 71.4 ng/mL (ref 12–200)
Troponin i, poc: <0.05 ng/mL (ref 0.00–0.09)

## 2010-11-15 LAB — DIFFERENTIAL
Eosinophils Relative: 1 % (ref 0–5)
Lymphocytes Relative: 20 % (ref 12–46)
Lymphs Abs: 1.7 10*3/uL (ref 0.7–4.0)
Monocytes Absolute: 0.4 10*3/uL (ref 0.1–1.0)

## 2010-11-15 LAB — BASIC METABOLIC PANEL
GFR calc non Af Amer: >60 mL/min (ref >60)
GFR calc non Af Amer: >60 mL/min (ref >60)
Glucose, Bld: 130 mg/dL — ABNORMAL HIGH (ref 70–99)
Potassium: 3.7 mEq/L (ref 3.5–5.1)
Potassium: 3.7 mEq/L (ref 3.5–5.1)
Sodium: 138 mEq/L (ref 135–145)
Sodium: 140 mEq/L (ref 135–145)

## 2010-11-15 LAB — CK TOTAL AND CKMB (NOT AT ARMC)
Relative Index: 2.3 (ref 0.0–2.5)
Total CK: 115 U/L (ref 7–177)

## 2010-11-15 LAB — B-NATRIURETIC PEPTIDE (CONVERTED LAB): Pro B Natriuretic peptide (BNP): <30 pg/mL (ref 0.0–100.0)

## 2010-11-15 LAB — HEMOGLOBIN A1C
Hgb A1c MFr Bld: 8.2 % — ABNORMAL HIGH (ref 4.6–6.1)
Mean Plasma Glucose: 189 mg/dL

## 2011-02-13 ENCOUNTER — Other Ambulatory Visit: Payer: Self-pay | Admitting: Family Medicine

## 2011-02-13 DIAGNOSIS — Z1231 Encounter for screening mammogram for malignant neoplasm of breast: Secondary | ICD-10-CM

## 2011-03-18 ENCOUNTER — Ambulatory Visit
Admission: RE | Admit: 2011-03-18 | Discharge: 2011-03-18 | Disposition: A | Discharge status: Home / Self Care | Payer: Self-pay | Source: Ambulatory Visit | Attending: Family Medicine | Admitting: Family Medicine

## 2011-03-18 DIAGNOSIS — Z1231 Encounter for screening mammogram for malignant neoplasm of breast: Secondary | ICD-10-CM

## 2011-08-17 ENCOUNTER — Ambulatory Visit (INDEPENDENT_AMBULATORY_CARE_PROVIDER_SITE_OTHER): Payer: Medicare Other | Admitting: Emergency Medicine

## 2011-08-17 VITALS — BP 160/96 | HR 106 | Temp 98.2°F | Resp 18 | Ht 64.0 in | Wt 281.0 lb

## 2011-08-17 DIAGNOSIS — M25559 Pain in unspecified hip: Secondary | ICD-10-CM

## 2011-08-17 DIAGNOSIS — I10 Essential (primary) hypertension: Secondary | ICD-10-CM

## 2011-08-17 DIAGNOSIS — E119 Type 2 diabetes mellitus without complications: Secondary | ICD-10-CM

## 2011-08-17 NOTE — Progress Notes (Signed)
  Subjective:    Patient ID: Teresa Stanley, female    DOB: 1945/10/20, 66 y.o.   MRN: [id number]  Fall The accident occurred 3 to 5 days ago. The fall occurred while walking. She fell from a height of 1 to 2 ft. She landed on concrete. There was no blood loss. The point of impact was the left knee and right knee. The pain is present in the left knee and right knee. The pain is mild. Associated symptoms include headaches. Pertinent negatives include no abdominal pain, bowel incontinence, fever, hearing loss, hematuria, loss of consciousness, nausea, numbness, tingling, visual change or vomiting. She has tried nothing for the symptoms.      Review of Systems  Constitutional: Negative.  Negative for fever.  HENT: Negative for trouble swallowing, neck stiffness and voice change.   Eyes: Negative.   Respiratory: Negative.   Cardiovascular: Negative.   Gastrointestinal: Negative for nausea, vomiting, abdominal pain and bowel incontinence.  Genitourinary: Negative.  Negative for hematuria.  Musculoskeletal: Negative.   Neurological: Positive for light-headedness (this morning and it passed after an hour) and headaches. Negative for tingling, seizures, loss of consciousness, syncope, facial asymmetry, speech difficulty, weakness and numbness.       Objective:   Physical Exam  Nursing note and vitals reviewed. Constitutional: She is oriented to person, place, and time. She appears well-developed and well-nourished.  HENT:  Head: Normocephalic and atraumatic.  Right Ear: External ear normal.  Left Ear: External ear normal.  Nose: Nose normal.  Mouth/Throat: Oropharynx is clear and moist.  Eyes: Conjunctivae and EOM are normal. Pupils are equal, round, and reactive to light.  Neck: Normal range of motion. Neck supple. No JVD present. No tracheal deviation present.  Cardiovascular: Normal rate, regular rhythm and normal heart sounds.   Pulmonary/Chest: Effort normal and breath sounds normal.    Abdominal: Soft.  Musculoskeletal: Normal range of motion.  Neurological: She is alert and oriented to person, place, and time. No cranial nerve deficit. She exhibits normal muscle tone. Coordination normal.  Skin: Skin is warm and dry.          Assessment & Plan:  Continue medications. Follow up with FMD on Tuesday To ER for new or worsened symptoms

## 2011-08-28 ENCOUNTER — Encounter (HOSPITAL_COMMUNITY): Payer: Self-pay | Admitting: Emergency Medicine

## 2011-08-28 ENCOUNTER — Emergency Department (HOSPITAL_COMMUNITY)
Admission: EM | Admit: 2011-08-28 | Discharge: 2011-08-28 | Disposition: A | Discharge status: Home / Self Care | Payer: Medicare Other | Attending: Emergency Medicine | Admitting: Emergency Medicine

## 2011-08-28 ENCOUNTER — Other Ambulatory Visit: Payer: Self-pay

## 2011-08-28 DIAGNOSIS — Z7982 Long term (current) use of aspirin: Secondary | ICD-10-CM | POA: Insufficient documentation

## 2011-08-28 DIAGNOSIS — E785 Hyperlipidemia, unspecified: Secondary | ICD-10-CM | POA: Insufficient documentation

## 2011-08-28 DIAGNOSIS — E119 Type 2 diabetes mellitus without complications: Secondary | ICD-10-CM | POA: Insufficient documentation

## 2011-08-28 DIAGNOSIS — R209 Unspecified disturbances of skin sensation: Secondary | ICD-10-CM | POA: Insufficient documentation

## 2011-08-28 DIAGNOSIS — R202 Paresthesia of skin: Secondary | ICD-10-CM

## 2011-08-28 DIAGNOSIS — K219 Gastro-esophageal reflux disease without esophagitis: Secondary | ICD-10-CM | POA: Insufficient documentation

## 2011-08-28 DIAGNOSIS — Z79899 Other long term (current) drug therapy: Secondary | ICD-10-CM | POA: Insufficient documentation

## 2011-08-28 DIAGNOSIS — I10 Essential (primary) hypertension: Secondary | ICD-10-CM | POA: Insufficient documentation

## 2011-08-28 HISTORY — DX: Hyperlipidemia, unspecified: E78.5

## 2011-08-28 HISTORY — DX: Essential (primary) hypertension: I10

## 2011-08-28 NOTE — ED Notes (Signed)
MD at bedside. Dr. Hosmer at bedside.  

## 2011-08-28 NOTE — ED Notes (Signed)
Pt c/o pain from elbow to fingers on R arm. Pt states she has some tingling to finger tips on R hand. Pt states she has hx of bursitis in R arm. Pt states she fell a week ago and landed on her knees.

## 2011-08-28 NOTE — ED Provider Notes (Signed)
History     CSN: [id number]  Arrival date & time 08/28/11  1628   First MD Initiated Contact with Patient 08/28/11 1700      Chief Complaint  Patient presents with  . Arm Pain    (Consider location/radiation/quality/duration/timing/severity/associated sxs/prior treatment) HPI Comments: Patient presents with complaints of right arm numbness and tingling that begins at her elbow and radiates down to her hand.  This has been occurring intermittently since Sunday.  She notes no new injuries or falls.  She states that when the symptoms occur they last only a few minutes.  They occur approximately 3 times per day.  Patient has not noted a specific inciting factor but does note when she first stands up and holds her arm at her side that seems to cause some tingling.  She's noted no weakness.  She is right-hand dominant and is able to perform her normal activities such as eating and writing.  She's had no fevers, nausea, vomiting, chest pain, shortness of breath or neck pain.  No prior neck injuries or arm injuries.  Patient states she presented today because she just wanted to know what it was.  The history is provided by the patient.    Past Medical History  Diagnosis Date  . Hypertension   . Diabetes mellitus   . Hyperlipidemia   . Reflux     Past Surgical History  Procedure Date  . Abdominal hysterectomy     History reviewed. No pertinent family history.  History  Substance Use Topics  . Smoking status: Never Smoker   . Smokeless tobacco: Not on file  . Alcohol Use: No    OB History    Grav Para Term Preterm Abortions TAB SAB Ect Mult Living                  Review of Systems  Constitutional: Negative.  Negative for fever and chills.  HENT: Negative.   Eyes: Negative.  Negative for discharge and redness.  Respiratory: Negative.  Negative for cough and shortness of breath.   Cardiovascular: Negative.  Negative for chest pain.  Gastrointestinal: Negative.  Negative for  nausea, vomiting, abdominal pain and diarrhea.  Genitourinary: Negative.   Musculoskeletal: Negative.  Negative for back pain.  Skin: Negative.  Negative for color change and rash.  Neurological: Positive for numbness. Negative for syncope and headaches.  Hematological: Negative.  Negative for adenopathy.  Psychiatric/Behavioral: Negative.  Negative for confusion.  All other systems reviewed and are negative.    Allergies  Review of patient's allergies indicates no known allergies.  Home Medications   Current Outpatient Rx  Name Route Sig Dispense Refill  . ACETAMINOPHEN 500 MG PO TABS Oral Take 500 mg by mouth every 6 (six) hours as needed. For pain.    [name] AMLODIPINE BESYLATE 10 MG PO TABS Oral Take 10 mg by mouth daily.    . ASPIRIN EC 81 MG PO TBEC Oral Take 81 mg by mouth daily.    . ATORVASTATIN CALCIUM 20 MG PO TABS Oral Take 20 mg by mouth daily.    . CHLORTHALIDONE 25 MG PO TABS Oral Take 25 mg by mouth daily.    [name] CLONIDINE HCL 0.2 MG PO TABS Oral Take 0.2 mg by mouth 2 (two) times daily.    [name] GLIPIZIDE 10 MG PO TABS Oral Take 10 mg by mouth 2 (two) times daily before a meal.    . METFORMIN HCL 1000 MG PO TABS Oral Take 1,000 mg by  mouth 2 (two) times daily with a meal.    . METOPROLOL TARTRATE 100 MG PO TABS Oral Take 100 mg by mouth 2 (two) times daily.    [name] OMEPRAZOLE 20 MG PO CPDR Oral Take 20 mg by mouth daily.    [name] PIOGLITAZONE HCL 15 MG PO TABS Oral Take 15 mg by mouth daily.      BP 168/68  Pulse 101  Temp 99.3 F (37.4 C) (Oral)  Resp 20  SpO2 98%  Physical Exam  Vitals reviewed. Constitutional: She is oriented to person, place, and time. She appears well-developed and well-nourished.  Non-toxic appearance. She does not have a sickly appearance.  HENT:  Head: Normocephalic and atraumatic.  Eyes: Conjunctivae, EOM and lids are normal. Pupils are equal, round, and reactive to light. No scleral icterus.  Neck: Trachea normal and normal range of motion. Neck  supple.  Cardiovascular: Normal rate, regular rhythm and normal heart sounds.   Pulmonary/Chest: Effort normal and breath sounds normal.  Abdominal: Soft. Normal appearance. There is no tenderness. There is no rebound, no guarding and no CVA tenderness.  Musculoskeletal: Normal range of motion.       Patient has a palpable radial pulse on the right wrist and capillary refill less than 2 seconds in all of her fingertips.  There is no erythema, warmth or swelling in her right upper extremity.  Patient has no tenderness to palpation anywhere in her right upper extremity.  She has full passive and active range of motion at her shoulder elbow and wrist.  There is no change in symptomatology with raising her arm overhead.  No C-spine tenderness on examination  Neurological: She is alert and oriented to person, place, and time. She has normal strength.       Patient has full flexion and extension of her elbow with normal strength.  She complex extend her wrist without difficulty.  She can also abduct and adduct her thumb as well as oppose her thumb to each of her fingers.  Sensation is intact.  Skin: Skin is warm, dry and intact. No rash noted.  Psychiatric: She has a normal mood and affect. Her behavior is normal. Judgment and thought content normal.    ED Course  Procedures (including critical care time)  Labs Reviewed - No data to display No results found.   No diagnosis found.   Date: 08/28/2011  Rate: 99  Rhythm: normal sinus rhythm  QRS Axis: left  Intervals: normal  ST/T Wave abnormalities: normal  Conduction Disutrbances:none  Narrative Interpretation:   Old EKG Reviewed: none available    MDM  Patient with intermittent distal right arm paresthesias of unclear etiology.  She has no recent injury to suggest fracture or dislocation.  Patient has full mobility of her shoulder, elbow and wrist and fingers.  She denies any weakness with this at home.  She is right-hand dominant and is  able to perform all of her normal activities such as fine motor movements with eating or writing.  She has no signs of neurovascular deficit on exam she is a palpable radial pulse and capillary refill less than 2 seconds in her fingertips.  She has no swelling or redness to suggest infection or bursitis.  Patient could have some mild inflammation of her ulnar nerve but again her symptoms are intermittent and only last a few minutes at a time so it is unclear.  I have recommended that the patient followup with her primary care physician if her symptoms  continue until next week.        Nat Christen, MD 08/28/11 [phone]

## 2011-08-28 NOTE — ED Notes (Signed)
Right elbow pain, radiates to right hand, started Monday-- thought it was arthritis, but it "feels different"

## 2011-08-28 NOTE — ED Notes (Signed)
Pt presenting to ed with c/o right arm pain x 3 days. Pt states pain radiates down her arm and she is having numbness and tingling. Pt denies chest pain, nausea and vomiting at this time.

## 2011-12-15 ENCOUNTER — Ambulatory Visit (INDEPENDENT_AMBULATORY_CARE_PROVIDER_SITE_OTHER): Payer: Medicare Other | Admitting: Family Medicine

## 2011-12-15 VITALS — BP 152/84 | HR 109 | Temp 97.9°F | Resp 18 | Ht 64.0 in | Wt 280.0 lb

## 2011-12-15 DIAGNOSIS — I10 Essential (primary) hypertension: Secondary | ICD-10-CM | POA: Insufficient documentation

## 2011-12-15 DIAGNOSIS — E1122 Type 2 diabetes mellitus with diabetic chronic kidney disease: Secondary | ICD-10-CM | POA: Insufficient documentation

## 2011-12-15 DIAGNOSIS — Z794 Long term (current) use of insulin: Secondary | ICD-10-CM | POA: Insufficient documentation

## 2011-12-15 DIAGNOSIS — N1832 Chronic kidney disease, stage 3b: Secondary | ICD-10-CM | POA: Insufficient documentation

## 2011-12-15 DIAGNOSIS — E119 Type 2 diabetes mellitus without complications: Secondary | ICD-10-CM

## 2011-12-15 LAB — POCT GLYCOSYLATED HEMOGLOBIN (HGB A1C): Hemoglobin A1C: 8.6

## 2011-12-15 MED ORDER — PIOGLITAZONE HCL 30 MG PO TABS: 30.0000 mg | ORAL_TABLET | Freq: Every day | ORAL | Status: DC

## 2011-12-15 MED ORDER — METOPROLOL TARTRATE 100 MG PO TABS: 100.0000 mg | ORAL_TABLET | Freq: Two times a day (BID) | ORAL | Status: DC

## 2011-12-15 MED ORDER — GLIPIZIDE 10 MG PO TABS: 10.0000 mg | ORAL_TABLET | Freq: Two times a day (BID) | ORAL | Status: DC

## 2011-12-15 MED ORDER — LISINOPRIL 20 MG PO TABS: 20.0000 mg | ORAL_TABLET | Freq: Two times a day (BID) | ORAL | Status: DC

## 2011-12-15 NOTE — Patient Instructions (Signed)
Your blood sugar in the morning should be <120 - around 100 would be perfect.  During the day, make sure all your sugars stay <200 but around 150 would be better.  Your blood pressure should ideally be around 120/80 but 140/85 is to high.  Restart your clonidine 0.2mg  but take 1/2 tab by mouth twice a day so you are taking 0.1mg  twice a day.  Your pulse should be <100.  With your blood pressure medications, it should more likely run 70-80.  If it is high try to drink more water and less caffeine.

## 2011-12-15 NOTE — Progress Notes (Signed)
  Subjective:    Patient ID: Teresa Stanley, female    DOB: 1945/06/11, 66 y.o.   MRN: [id number]  HPI  Was going to [facility]SE and so ran out of bp meds.  Now has a new pt appt at [facility] on 12/3 but going to try to get into Dr. Allyne Gee (her daughter's PCP) alternatively.   Does have some white coat HTN - checks bp at home and it was 140/87 yest - this is nml for her.  She was told to cut back on the clonidine and so she stopped it over a month ago. Does not drink much water and does drink a lot of caffiene.  Sugar is doing ok - checking cbgs qd to bid - fasting 115-130 depending on evening snack before.  During day, cbgs often 180, no 200s, no lows.  Past Medical History  Diagnosis Date  . Hypertension   . Diabetes mellitus   . Hyperlipidemia   . Reflux      Review of Systems  Constitutional: Negative for fever, chills, diaphoresis, activity change, appetite change, fatigue and unexpected weight change.  Eyes: Negative for visual disturbance.  Respiratory: Negative for cough and shortness of breath.   Cardiovascular: Negative for chest pain, palpitations and leg swelling.  Genitourinary: Negative for decreased urine volume.  Musculoskeletal: Negative for joint swelling and gait problem.  Skin: Negative for rash and wound.  Neurological: Negative for syncope and headaches.  Hematological: Does not bruise/bleed easily.      BP 152/84  Pulse 109  Temp 97.9 F (36.6 C) (Oral)  Resp 18  Ht 5\' 4"  (1.626 m)  Wt 280 lb (127.007 kg)  BMI 48.06 kg/m2  SpO2 97%  Objective:   Physical Exam  Constitutional: She is oriented to person, place, and time. She appears well-developed and well-nourished. No distress.  HENT:  Head: Normocephalic and atraumatic.  Right Ear: External ear normal.  Left Ear: External ear normal.  Eyes: Conjunctivae normal are normal. No scleral icterus.  Neck: Normal range of motion. Neck supple. No thyromegaly present.  Cardiovascular: Normal rate,  regular rhythm, normal heart sounds and intact distal pulses.   Pulmonary/Chest: Effort normal and breath sounds normal. No respiratory distress.  Musculoskeletal: She exhibits no edema.  Lymphadenopathy:    She has no cervical adenopathy.  Neurological: She is alert and oriented to person, place, and time.  Skin: Skin is warm and dry. She is not diaphoretic. No erythema.  Psychiatric: She has a normal mood and affect. Her behavior is normal.          Results for orders placed in visit on 12/15/11  POCT GLYCOSYLATED HEMOGLOBIN (HGB A1C)      Component Value Range   Hemoglobin A1C 8.6      Assessment & Plan:  1. HTN - start clonidine 0.1mg  bid (pt has 0.2mg  at home and will cut in half). Refilled other meds. 1. Hypertension  lisinopril (PRINIVIL,ZESTRIL) 20 MG tablet, metoprolol (LOPRESSOR) 100 MG tablet  2. Diabetes mellitus  POCT glycosylated hemoglobin (Hb A1C), pioglitazone (ACTOS) 30 MG tablet, glipiZIDE (GLUCOTROL) 10 MG tablet  Increase actos from 15 to 30 - take 2 tabs of the 15 until she picks up the new bottle. maxed out on metformin on glucotrol. Consider augmenting w/ januvia or other at f/u.

## 2012-02-13 ENCOUNTER — Other Ambulatory Visit: Payer: Self-pay | Admitting: Internal Medicine

## 2012-02-13 DIAGNOSIS — Z1231 Encounter for screening mammogram for malignant neoplasm of breast: Secondary | ICD-10-CM

## 2012-03-18 ENCOUNTER — Ambulatory Visit
Admission: RE | Admit: 2012-03-18 | Discharge: 2012-03-18 | Disposition: A | Discharge status: Home / Self Care | Payer: Medicare Other | Source: Ambulatory Visit | Attending: Internal Medicine | Admitting: Internal Medicine

## 2012-03-18 DIAGNOSIS — Z1231 Encounter for screening mammogram for malignant neoplasm of breast: Secondary | ICD-10-CM

## 2012-05-01 ENCOUNTER — Telehealth: Payer: Self-pay

## 2012-05-01 DIAGNOSIS — E119 Type 2 diabetes mellitus without complications: Secondary | ICD-10-CM

## 2012-05-01 NOTE — Telephone Encounter (Signed)
Ok to RF for 1 months, but needs OV and labs since last seen in Nov 2013

## 2012-05-01 NOTE — Telephone Encounter (Signed)
Pharmacy called and wanted to get a refill on pt's Actos. Does pt need to be seen? Please advise

## 2012-05-02 MED ORDER — PIOGLITAZONE HCL 30 MG PO TABS: 30.0000 mg | ORAL_TABLET | Freq: Every day | ORAL | Status: DC

## 2012-05-02 NOTE — Telephone Encounter (Signed)
Refill sent.

## 2012-06-11 ENCOUNTER — Other Ambulatory Visit: Payer: Self-pay | Admitting: Family Medicine

## 2012-06-11 NOTE — Telephone Encounter (Signed)
Needs appt

## 2012-08-07 ENCOUNTER — Ambulatory Visit (INDEPENDENT_AMBULATORY_CARE_PROVIDER_SITE_OTHER): Payer: Medicare Other | Admitting: Family Medicine

## 2012-08-07 VITALS — BP 154/85 | HR 98 | Temp 98.2°F | Resp 18 | Ht 63.25 in | Wt 289.0 lb

## 2012-08-07 DIAGNOSIS — L03314 Cellulitis of groin: Secondary | ICD-10-CM

## 2012-08-07 DIAGNOSIS — L02219 Cutaneous abscess of trunk, unspecified: Secondary | ICD-10-CM

## 2012-08-07 DIAGNOSIS — E669 Obesity, unspecified: Secondary | ICD-10-CM

## 2012-08-07 DIAGNOSIS — R1030 Lower abdominal pain, unspecified: Secondary | ICD-10-CM

## 2012-08-07 DIAGNOSIS — B372 Candidiasis of skin and nail: Secondary | ICD-10-CM

## 2012-08-07 MED ORDER — LEVOFLOXACIN 500 MG PO TABS: 500.0000 mg | ORAL_TABLET | Freq: Every day | ORAL | Status: DC

## 2012-08-07 MED ORDER — NYSTATIN 100000 UNIT/GM EX POWD: Freq: Three times a day (TID) | CUTANEOUS | Status: DC

## 2012-08-07 MED ORDER — TRAMADOL HCL 50 MG PO TABS: 50.0000 mg | ORAL_TABLET | Freq: Four times a day (QID) | ORAL | Status: DC | PRN

## 2012-08-07 NOTE — Patient Instructions (Signed)
Start powder to affected area, start antibiotic.  Wash area with soap and water twice per day, then allow to dry to air. Recheck in 2 days with Dr. Cleta Alberts.  Return to the clinic or go to the nearest emergency room if any of your symptoms worsen or new symptoms occur. Intertrigo Intertrigo is a skin condition that occurs in between folds of skin in places on the body that rub together a lot and do not get much ventilation. It is caused by heat, moisture, friction, sweat retention, and lack of air circulation, which produces red, irritated patches and, sometimes, scaling or drainage. People who have diabetes, who are obese, or who have treatment with antibiotics are at increased risk for intertrigo. The most common sites for intertrigo to occur include:  The groin.  The breasts.  The armpits.  Folds of abdominal skin.  Webbed spaces between the fingers or toes. Intertrigo may be aggravated by:  Sweat.  Feces.  Yeast or bacteria that are present near skin folds.  Urine.  Vaginal discharge. HOME CARE INSTRUCTIONS  The following steps can be taken to reduce friction and keep the affected area cool and dry:  Expose skin folds to the air.  Keep deep skin folds separated with cotton or linen cloth. Avoid tight fitting clothing that could cause chafing.  Wear open-toed shoes or sandals to help reduce moisture between the toes.  Apply absorbent powders to affected areas as directed by your caregiver.  Apply over-the-counter barrier pastes, such as zinc oxide, as directed by your caregiver.  If you develop a fungal infection in the affected area, your caregiver may have you use antifungal creams. SEEK MEDICAL CARE IF:   The rash is not improving after 1 week of treatment.  The rash is getting worse (more red, more swollen, more painful, or spreading).  You have a fever or chills. MAKE SURE YOU:   Understand these instructions.  Will watch your condition.  Will get help right  away if you are not doing well or get worse. Document Released: 01/27/2005 Document Revised: 04/21/2011 Document Reviewed: 07/12/2009 [facility] Patient Information 2014 [place], [place].   Cellulitis Cellulitis is an infection of the skin and the tissue beneath it. The infected area is usually red and tender. Cellulitis occurs most often in the arms and lower legs.  CAUSES  Cellulitis is caused by bacteria that enter the skin through cracks or cuts in the skin. The most common types of bacteria that cause cellulitis are Staphylococcus and Streptococcus. SYMPTOMS   Redness and warmth.  Swelling.  Tenderness or pain.  Fever. DIAGNOSIS  Your caregiver can usually determine what is wrong based on a physical exam. Blood tests may also be done. TREATMENT  Treatment usually involves taking an antibiotic medicine. HOME CARE INSTRUCTIONS   Take your antibiotics as directed. Finish them even if you start to feel better.  Keep the infected arm or leg elevated to reduce swelling.  Apply a warm cloth to the affected area up to 4 times per day to relieve pain.  Only take over-the-counter or prescription medicines for pain, discomfort, or fever as directed by your caregiver.  Keep all follow-up appointments as directed by your caregiver. SEEK MEDICAL CARE IF:   You notice red streaks coming from the infected area.  Your red area gets larger or turns dark in color.  Your bone or joint underneath the infected area becomes painful after the skin has healed.  Your infection returns in the same area or  another area.  You notice a swollen bump in the infected area.  You develop new symptoms. SEEK IMMEDIATE MEDICAL CARE IF:   You have a fever.  You feel very sleepy.  You develop vomiting or diarrhea.  You have a general ill feeling (malaise) with muscle aches and pains. MAKE SURE YOU:   Understand these instructions.  Will watch your condition.  Will get help right away if you  are not doing well or get worse. Document Released: 11/06/2004 Document Revised: 07/29/2011 Document Reviewed: 04/14/2011 [facility] Patient Information 2014 [place], [place].

## 2012-08-07 NOTE — Addendum Note (Signed)
Addended by: Meredith Staggers R on: 08/07/2012 10:56 AM   Modules accepted: Orders

## 2012-08-07 NOTE — Progress Notes (Addendum)
Subjective:    Patient ID: Teresa Stanley, female    DOB: [birth year], 67 y.o.   MRN: [id number]  HPI Teresa Stanley is a 67 y.o. female  Woke up this am - with L sided back/buttock to L thigh pain.  NKI, no known prior similar problems.  tx alleve this am - min to no relief. No bowel or bladder incontinence, no saddle anesthesia, no lower extremity weakness. No rash, feels inside, no numbness or burning of skin. No pain with back or hip rom. No fever.   Diabetes - last aic around 7, blood sugars ok - in 90's.     Uses cane for Knee arthritis - followed by Dr. Ophelia Charter - next appt 7/15.     Past Medical History  Diagnosis Date  . Hypertension   . Diabetes mellitus   . Hyperlipidemia   . Reflux    Past Surgical History  Procedure Laterality Date  . Abdominal hysterectomy     No Known Allergies Prior to Admission medications   Medication Sig Start Date End Date Taking? Authorizing Provider  acetaminophen (TYLENOL) 500 MG tablet Take 500 mg by mouth every 6 (six) hours as needed. For pain.   Yes Historical Provider, MD  amLODipine (NORVASC) 10 MG tablet Take 10 mg by mouth daily.   Yes Historical Provider, MD  aspirin EC 81 MG tablet Take 81 mg by mouth daily.   Yes Historical Provider, MD  atorvastatin (LIPITOR) 20 MG tablet Take 20 mg by mouth daily.   Yes Historical Provider, MD  chlorthalidone (HYGROTON) 25 MG tablet Take 25 mg by mouth daily.   Yes Historical Provider, MD  glipiZIDE (GLUCOTROL) 10 MG tablet Take 1 tablet (10 mg total) by mouth 2 (two) times daily before a meal. 12/15/11  Yes Sherren Mocha, MD  lisinopril (PRINIVIL,ZESTRIL) 20 MG tablet Take 1 tablet (20 mg total) by mouth 2 (two) times daily. 12/15/11  Yes Sherren Mocha, MD  metFORMIN (GLUCOPHAGE) 1000 MG tablet Take 1,000 mg by mouth 2 (two) times daily with a meal.   Yes Historical Provider, MD  metoprolol (LOPRESSOR) 100 MG tablet TAKE 1 TABLET (100 MG TOTAL) BY MOUTH 2 (TWO) TIMES DAILY. 06/11/12  Yes Ryan M Dunn, PA-C    omeprazole (PRILOSEC) 20 MG capsule Take 20 mg by mouth daily.   Yes Historical Provider, MD  pioglitazone (ACTOS) 30 MG tablet Take 1 tablet (30 mg total) by mouth daily. 05/02/12  Yes Eleanore Delia Chimes, PA-C  cloNIDine (CATAPRES) 0.2 MG tablet Take 0.2 mg by mouth 2 (two) times daily.    Historical Provider, MD   History   Social History  . Marital Status: Married    Spouse Name: N/A    Number of Children: N/A  . Years of Education: N/A   Occupational History  . Not on file.   Social History Main Topics  . Smoking status: Never Smoker   . Smokeless tobacco: Not on file  . Alcohol Use: No  . Drug Use: No  . Sexually Active:    Other Topics Concern  . Not on file   Social History Narrative  . No narrative on file        Review of Systems  Constitutional: Negative for fever and chills.  Genitourinary: Negative for dysuria, frequency, hematuria and difficulty urinating.  Musculoskeletal: Positive for back pain and arthralgias.  Skin: Negative for rash.       Objective:   Physical Exam  Constitutional: She appears  well-developed and well-nourished. No distress.  Obese.   Cardiovascular: Normal rate, regular rhythm, normal heart sounds and intact distal pulses.   Pulmonary/Chest: Effort normal and breath sounds normal.  Abdominal: Soft. There is no tenderness. There is no CVA tenderness.  Musculoskeletal:       Left hip: She exhibits normal range of motion, normal strength and no tenderness.       Lumbar back: Normal. She exhibits normal range of motion, no tenderness and no bony tenderness.  Skin: Skin is warm. Rash noted. Rash is not vesicular. There is erythema.        Results for orders placed in visit on 08/07/12  POCT SKIN KOH      Result Value Range   Skin KOH, POC Negative          Assessment & Plan:  Teresa Stanley is a 67 y.o. female Candidal intertrigo - Plan: POCT Skin KOH, nystatin (MYCOSTATIN) powder  Cellulitis of groin - Plan: Wound  culture, levofloxacin (LEVAQUIN) 500 MG tablet  Obesity, unspecified  Suspected candida intertrigo with secondary cellulitis, and concern for possible psueudomonas with odo and wet environment.  Start levaquin in addition to nystatin, skin care discussed and recheck in 48 hours with Dr. Cleta Alberts. Rtc/er precautions. Ultram given if needed and alleve ineffective.  Sed.   Meds ordered this encounter  Medications  . nystatin (MYCOSTATIN) powder    Sig: Apply topically 3 (three) times daily.    Dispense:  15 g    Refill:  0  . levofloxacin (LEVAQUIN) 500 MG tablet    Sig: Take 1 tablet (500 mg total) by mouth daily.    Dispense:  10 tablet    Refill:  0   Patient Instructions  Start powder to affected area, start antibiotic.  Wash area with soap and water twice per day, then allow to dry to air. Recheck in 2 days with Dr. Cleta Alberts.  Return to the clinic or go to the nearest emergency room if any of your symptoms worsen or new symptoms occur. Intertrigo Intertrigo is a skin condition that occurs in between folds of skin in places on the body that rub together a lot and do not get much ventilation. It is caused by heat, moisture, friction, sweat retention, and lack of air circulation, which produces red, irritated patches and, sometimes, scaling or drainage. People who have diabetes, who are obese, or who have treatment with antibiotics are at increased risk for intertrigo. The most common sites for intertrigo to occur include:  The groin.  The breasts.  The armpits.  Folds of abdominal skin.  Webbed spaces between the fingers or toes. Intertrigo may be aggravated by:  Sweat.  Feces.  Yeast or bacteria that are present near skin folds.  Urine.  Vaginal discharge. HOME CARE INSTRUCTIONS  The following steps can be taken to reduce friction and keep the affected area cool and dry:  Expose skin folds to the air.  Keep deep skin folds separated with cotton or linen cloth. Avoid tight  fitting clothing that could cause chafing.  Wear open-toed shoes or sandals to help reduce moisture between the toes.  Apply absorbent powders to affected areas as directed by your caregiver.  Apply over-the-counter barrier pastes, such as zinc oxide, as directed by your caregiver.  If you develop a fungal infection in the affected area, your caregiver may have you use antifungal creams. SEEK MEDICAL CARE IF:   The rash is not improving after 1 week of treatment.  The rash is getting worse (more red, more swollen, more painful, or spreading).  You have a fever or chills. MAKE SURE YOU:   Understand these instructions.  Will watch your condition.  Will get help right away if you are not doing well or get worse. Document Released: 01/27/2005 Document Revised: 04/21/2011 Document Reviewed: 07/12/2009 [facility] Patient Information 2014 [place], [place].   Cellulitis Cellulitis is an infection of the skin and the tissue beneath it. The infected area is usually red and tender. Cellulitis occurs most often in the arms and lower legs.  CAUSES  Cellulitis is caused by bacteria that enter the skin through cracks or cuts in the skin. The most common types of bacteria that cause cellulitis are Staphylococcus and Streptococcus. SYMPTOMS   Redness and warmth.  Swelling.  Tenderness or pain.  Fever. DIAGNOSIS  Your caregiver can usually determine what is wrong based on a physical exam. Blood tests may also be done. TREATMENT  Treatment usually involves taking an antibiotic medicine. HOME CARE INSTRUCTIONS   Take your antibiotics as directed. Finish them even if you start to feel better.  Keep the infected arm or leg elevated to reduce swelling.  Apply a warm cloth to the affected area up to 4 times per day to relieve pain.  Only take over-the-counter or prescription medicines for pain, discomfort, or fever as directed by your caregiver.  Keep all follow-up appointments as  directed by your caregiver. SEEK MEDICAL CARE IF:   You notice red streaks coming from the infected area.  Your red area gets larger or turns dark in color.  Your bone or joint underneath the infected area becomes painful after the skin has healed.  Your infection returns in the same area or another area.  You notice a swollen bump in the infected area.  You develop new symptoms. SEEK IMMEDIATE MEDICAL CARE IF:   You have a fever.  You feel very sleepy.  You develop vomiting or diarrhea.  You have a general ill feeling (malaise) with muscle aches and pains. MAKE SURE YOU:   Understand these instructions.  Will watch your condition.  Will get help right away if you are not doing well or get worse. Document Released: 11/06/2004 Document Revised: 07/29/2011 Document Reviewed: 04/14/2011 [facility] Patient Information 2014 [place], [place].

## 2012-08-08 ENCOUNTER — Other Ambulatory Visit: Payer: Self-pay | Admitting: Physician Assistant

## 2012-08-09 ENCOUNTER — Ambulatory Visit (INDEPENDENT_AMBULATORY_CARE_PROVIDER_SITE_OTHER): Payer: Medicare Other | Admitting: Emergency Medicine

## 2012-08-09 VITALS — BP 145/83 | HR 81 | Temp 97.9°F | Resp 20 | Ht 63.0 in | Wt 289.2 lb

## 2012-08-09 DIAGNOSIS — B372 Candidiasis of skin and nail: Secondary | ICD-10-CM

## 2012-08-09 LAB — WOUND CULTURE: Gram Stain: NONE SEEN

## 2012-08-09 MED ORDER — FLUCONAZOLE 100 MG PO TABS: 100.0000 mg | ORAL_TABLET | Freq: Every day | ORAL | Status: DC

## 2012-08-09 NOTE — Patient Instructions (Addendum)
Intertrigo Intertrigo is a skin condition that occurs in between folds of skin in places on the body that rub together a lot and do not get much ventilation. It is caused by heat, moisture, friction, sweat retention, and lack of air circulation, which produces red, irritated patches and, sometimes, scaling or drainage. People who have diabetes, who are obese, or who have treatment with antibiotics are at increased risk for intertrigo. The most common sites for intertrigo to occur include:  The groin.  The breasts.  The armpits.  Folds of abdominal skin.  Webbed spaces between the fingers or toes. Intertrigo may be aggravated by:  Sweat.  Feces.  Yeast or bacteria that are present near skin folds.  Urine.  Vaginal discharge. HOME CARE INSTRUCTIONS  The following steps can be taken to reduce friction and keep the affected area cool and dry:  Expose skin folds to the air.  Keep deep skin folds separated with cotton or linen cloth. Avoid tight fitting clothing that could cause chafing.  Wear open-toed shoes or sandals to help reduce moisture between the toes.  Apply absorbent powders to affected areas as directed by your caregiver.  Apply over-the-counter barrier pastes, such as zinc oxide, as directed by your caregiver.  If you develop a fungal infection in the affected area, your caregiver may have you use antifungal creams. SEEK MEDICAL CARE IF:   The rash is not improving after 1 week of treatment.  The rash is getting worse (more red, more swollen, more painful, or spreading).  You have a fever or chills. MAKE SURE YOU:   Understand these instructions.  Will watch your condition.  Will get help right away if you are not doing well or get worse. Document Released: 01/27/2005 Document Revised: 04/21/2011 Document Reviewed: 07/12/2009 [employer] Patient Information 2014 [employer].  

## 2012-08-09 NOTE — Progress Notes (Signed)
[facility] [address], [place] [address] [phone]  Date:  08/09/2012   Name:  Teresa Stanley   DOB:  [birth year]   MRN:  [id number]  PCP:  No primary provider on file.    Chief Complaint: Follow-up   History of Present Illness:  Teresa Stanley is a 67 y.o. very pleasant female patient who presents with the following:  Saturday saw Dr Neva Seat and put on meds for candidal intertrigo and cellulitis left inguinal crease.  Patient says she has improved somewhat but is still tender. She has no fever or chills.  No drainage.  Denies other complaint or health concern today.   Patient Active Problem List   Diagnosis Date Noted  . Candidal intertrigo 08/09/2012  . Hypertension 12/15/2011  . Diabetes mellitus 12/15/2011    Past Medical History  Diagnosis Date  . Hypertension   . Diabetes mellitus   . Hyperlipidemia   . Reflux     Past Surgical History  Procedure Laterality Date  . Abdominal hysterectomy      History  Substance Use Topics  . Smoking status: Never Smoker   . Smokeless tobacco: Not on file  . Alcohol Use: No    Family History  Problem Relation Age of Onset  . Diabetes Mother   . Diabetes Father   . Asthma Father     No Known Allergies  Medication list has been reviewed and updated.  Current Outpatient Prescriptions on File Prior to Visit  Medication Sig Dispense Refill  . acetaminophen (TYLENOL) 500 MG tablet Take 500 mg by mouth every 6 (six) hours as needed. For pain.      [name] amLODipine (NORVASC) 10 MG tablet Take 10 mg by mouth daily.      [name] aspirin EC 81 MG tablet Take 81 mg by mouth daily.      [name] atorvastatin (LIPITOR) 20 MG tablet Take 20 mg by mouth daily.      . cloNIDine (CATAPRES) 0.2 MG tablet Take 0.2 mg by mouth 2 (two) times daily.      [name] glipiZIDE (GLUCOTROL) 10 MG tablet Take 1 tablet (10 mg total) by mouth 2 (two) times daily before a meal.  60 tablet  3  . levofloxacin (LEVAQUIN) 500 MG tablet Take 1  tablet (500 mg total) by mouth daily.  10 tablet  0  . lisinopril (PRINIVIL,ZESTRIL) 20 MG tablet Take 1 tablet (20 mg total) by mouth 2 (two) times daily.  60 tablet  1  . metFORMIN (GLUCOPHAGE) 1000 MG tablet Take 1,000 mg by mouth 2 (two) times daily with a meal.      . metoprolol (LOPRESSOR) 100 MG tablet TAKE 1 TABLET (100 MG TOTAL) BY MOUTH 2 (TWO) TIMES DAILY.  60 tablet  1  . nystatin (MYCOSTATIN) powder Apply topically 3 (three) times daily.  15 g  0  . omeprazole (PRILOSEC) 20 MG capsule Take 20 mg by mouth daily.      . pioglitazone (ACTOS) 30 MG tablet Take 1 tablet (30 mg total) by mouth daily.  30 tablet  0  . traMADol (ULTRAM) 50 MG tablet Take 1 tablet (50 mg total) by mouth every 6 (six) hours as needed for pain.  20 tablet  0  . chlorthalidone (HYGROTON) 25 MG tablet Take 25 mg by mouth daily.       No current facility-administered medications on file prior to visit.    Review of Systems:  As per HPI,  otherwise negative.    Physical Examination: Filed Vitals:   08/09/12 1435  BP: 145/83  Pulse: 81  Temp: 97.9 F (36.6 C)  Resp: 20   Filed Vitals:   08/09/12 1435  Height: 5\' 3"  (1.6 m)  Weight: 289 lb 3.2 oz (131.18 kg)   Body mass index is 51.24 kg/(m^2). Ideal Body Weight: Weight in (lb) to have BMI = 25: 140.8   GEN: WDWN, NAD, Non-toxic, Alert & Oriented x 3 HEENT: Atraumatic, Normocephalic.  Ears and Nose: No external deformity. EXTR: No clubbing/cyanosis/edema NEURO: Normal gait.  PSYCH: Normally interactive. Conversant. Not depressed or anxious appearing.  Calm demeanor.  SKIN:  Left inguinal intertrigo  Assessment and Plan: Add diflucan Follow up in one week  Signed,  Phillips Odor, MD

## 2012-09-12 ENCOUNTER — Other Ambulatory Visit: Payer: Self-pay | Admitting: Physician Assistant

## 2012-09-13 NOTE — Telephone Encounter (Signed)
Med encounter

## 2013-02-15 ENCOUNTER — Other Ambulatory Visit: Payer: Self-pay

## 2013-02-15 DIAGNOSIS — Z1231 Encounter for screening mammogram for malignant neoplasm of breast: Secondary | ICD-10-CM

## 2013-02-21 ENCOUNTER — Other Ambulatory Visit (HOSPITAL_COMMUNITY): Payer: Self-pay | Admitting: Orthopaedic Surgery

## 2013-03-18 ENCOUNTER — Encounter (HOSPITAL_COMMUNITY): Payer: Self-pay | Admitting: Pharmacy Technician

## 2013-03-21 ENCOUNTER — Ambulatory Visit
Admission: RE | Admit: 2013-03-21 | Discharge: 2013-03-21 | Disposition: A | Discharge status: Home / Self Care | Payer: Medicare Other | Source: Ambulatory Visit

## 2013-03-21 DIAGNOSIS — Z1231 Encounter for screening mammogram for malignant neoplasm of breast: Secondary | ICD-10-CM

## 2013-03-24 ENCOUNTER — Ambulatory Visit (HOSPITAL_COMMUNITY)
Admission: RE | Admit: 2013-03-24 | Discharge: 2013-03-24 | Disposition: A | Discharge status: Home / Self Care | Payer: Medicare Other | Source: Ambulatory Visit | Attending: Orthopaedic Surgery | Admitting: Orthopaedic Surgery

## 2013-03-24 ENCOUNTER — Encounter (HOSPITAL_COMMUNITY): Payer: Self-pay

## 2013-03-24 ENCOUNTER — Encounter (HOSPITAL_COMMUNITY)
Admission: RE | Admit: 2013-03-24 | Discharge: 2013-03-24 | Disposition: A | Discharge status: Home / Self Care | Payer: Medicare Other | Source: Ambulatory Visit | Attending: Orthopaedic Surgery | Admitting: Orthopaedic Surgery

## 2013-03-24 DIAGNOSIS — Z01818 Encounter for other preprocedural examination: Secondary | ICD-10-CM | POA: Insufficient documentation

## 2013-03-24 DIAGNOSIS — Z01810 Encounter for preprocedural cardiovascular examination: Secondary | ICD-10-CM | POA: Insufficient documentation

## 2013-03-24 HISTORY — DX: Gastro-esophageal reflux disease without esophagitis: K21.9

## 2013-03-24 HISTORY — DX: Headache: R51

## 2013-03-24 HISTORY — DX: Unspecified osteoarthritis, unspecified site: M19.90

## 2013-03-24 HISTORY — DX: Pain in unspecified joint: M25.50

## 2013-03-24 LAB — COMPREHENSIVE METABOLIC PANEL
ALT: 11 U/L (ref 0–35)
AST: 18 U/L (ref 0–37)
Albumin: 4 g/dL (ref 3.5–5.2)
Alkaline Phosphatase: 113 U/L (ref 39–117)
BUN: 25 mg/dL — ABNORMAL HIGH (ref 6–23)
CO2: 25 mEq/L (ref 19–32)
Calcium: 10.1 mg/dL (ref 8.4–10.5)
Chloride: 100 mEq/L (ref 96–112)
Creatinine, Ser: 0.86 mg/dL (ref 0.50–1.10)
GFR, EST AFRICAN AMERICAN: 80 mL/min — AB (ref >90)
GFR, EST NON AFRICAN AMERICAN: 69 mL/min — AB (ref >90)
Glucose, Bld: 167 mg/dL — ABNORMAL HIGH (ref 70–99)
Potassium: 4.9 mEq/L (ref 3.7–5.3)
SODIUM: 141 meq/L (ref 137–147)
TOTAL PROTEIN: 7.5 g/dL (ref 6.0–8.3)
Total Bilirubin: 0.3 mg/dL (ref 0.3–1.2)

## 2013-03-24 LAB — CBC
HCT: 38.5 % (ref 36.0–46.0)
Hemoglobin: 12.3 g/dL (ref 12.0–15.0)
MCH: 29.4 pg (ref 26.0–34.0)
MCHC: 31.9 g/dL (ref 30.0–36.0)
MCV: 91.9 fL (ref 78.0–100.0)
PLATELETS: 303 10*3/uL (ref 150–400)
RBC: 4.19 MIL/uL (ref 3.87–5.11)
RDW: 15.2 % (ref 11.5–15.5)
WBC: 8.5 10*3/uL (ref 4.0–10.5)

## 2013-03-24 LAB — URINALYSIS, ROUTINE W REFLEX MICROSCOPIC
Bilirubin Urine: NEGATIVE
GLUCOSE, UA: 500 mg/dL — AB
Hgb urine dipstick: NEGATIVE
Ketones, ur: 15 mg/dL — AB
Leukocytes, UA: NEGATIVE
Nitrite: NEGATIVE
Protein, ur: NEGATIVE mg/dL
Specific Gravity, Urine: 1.029 (ref 1.005–1.030)
UROBILINOGEN UA: 0.2 mg/dL (ref 0.0–1.0)
pH: 5.5 (ref 5.0–8.0)

## 2013-03-24 LAB — PROTIME-INR
INR: 1.01 (ref 0.00–1.49)
PROTHROMBIN TIME: 13.1 s (ref 11.6–15.2)

## 2013-03-24 LAB — TYPE AND SCREEN
ABO/RH(D): O POS
ANTIBODY SCREEN: NEGATIVE

## 2013-03-24 LAB — APTT: aPTT: 33 seconds (ref 24–37)

## 2013-03-24 LAB — ABO/RH: ABO/RH(D): O POS

## 2013-03-24 LAB — SURGICAL PCR SCREEN
MRSA, PCR: NEGATIVE
STAPHYLOCOCCUS AUREUS: POSITIVE — AB

## 2013-03-24 MED ORDER — CHLORHEXIDINE GLUCONATE 4 % EX LIQD: 60.0000 mL | Freq: Once | CUTANEOUS | Status: DC

## 2013-03-24 NOTE — Progress Notes (Signed)
Pt doesn't have a cardiologist   Heart cath report in epic from 2009 and per pt it was normal and diagnosed as acid reflux  Denies ever having an echo or stress test  Medical Md with [facility] on [address]  Denies ekg or cxr in past yr

## 2013-03-24 NOTE — Pre-Procedure Instructions (Signed)
Teresa Stanley  03/24/2013   Your procedure is scheduled on:  Mon, Feb 23 @ 12:30 PM  Report to [facility]trance A at 10:30 AM.  Call this number if you have problems the morning of surgery: [phone]   Remember:   Do not eat food or drink liquids after midnight.   Take these medicines the morning of surgery with A SIP OF WATER: Amlodipine(Norvasc),Metoprolol(Lopressor),and Omeprazole(Prilosec)              Stop taking your Aspirin.No Goody's,BC's,Aleve,Ibuprofen,Fish Oil,or any Herbal Medications   Do not wear jewelry, make-up or nail polish.  Do not wear lotions, powders, or perfumes. You may wear deodorant.  Do not shave 48 hours prior to surgery.   Do not bring valuables to the hospital.  [facility] is not responsible                  for any belongings or valuables.               Contacts, dentures or bridgework may not be worn into surgery.  Leave suitcase in the car. After surgery it may be brought to your room.  For patients admitted to the hospital, discharge time is determined by your                treatment team.                 Special Instructions:  [place] - Preparing for Surgery  Before surgery, you can play an important role.  Because skin is not sterile, your skin needs to be as free of germs as possible.  You can reduce the number of germs on you skin by washing with CHG (chlorahexidine gluconate) soap before surgery.  CHG is an antiseptic cleaner which kills germs and bonds with the skin to continue killing germs even after washing.  Please DO NOT use if you have an allergy to CHG or antibacterial soaps.  If your skin becomes reddened/irritated stop using the CHG and inform your nurse when you arrive at Short Stay.  Do not shave (including legs and underarms) for at least 48 hours prior to the first CHG shower.  You may shave your face.  Please follow these instructions carefully:   1.  Shower with CHG Soap the night before surgery and the                                 morning of Surgery.  2.  If you choose to wash your hair, wash your hair first as usual with your       normal shampoo.  3.  After you shampoo, rinse your hair and body thoroughly to remove the                      Shampoo.  4.  Use CHG as you would any other liquid soap.  You can apply chg directly       to the skin and wash gently with scrungie or a clean washcloth.  5.  Apply the CHG Soap to your body ONLY FROM THE NECK DOWN.        Do not use on open wounds or open sores.  Avoid contact with your eyes,       ears, mouth and genitals (private parts).  Wash genitals (private parts)       with  your normal soap.  6.  Wash thoroughly, paying special attention to the area where your surgery        will be performed.  7.  Thoroughly rinse your body with warm water from the neck down.  8.  DO NOT shower/wash with your normal soap after using and rinsing off       the CHG Soap.  9.  Pat yourself dry with a clean towel.            10.  Wear clean pajamas.            11.  Place clean sheets on your bed the night of your first shower and do not        sleep with pets.  Day of Surgery  Do not apply any lotions/deoderants the morning of surgery.  Please wear clean clothes to the hospital/surgery center.     Please read over the following fact sheets that you were given: Pain Booklet, Coughing and Deep Breathing, Blood Transfusion Information, MRSA Information and Surgical Site Infection Prevention

## 2013-03-24 NOTE — Progress Notes (Signed)
Mupirocin prescription called into [facility] on [address]

## 2013-03-24 NOTE — Progress Notes (Signed)
03/24/13 1014  OBSTRUCTIVE SLEEP APNEA  Have you ever been diagnosed with sleep apnea through a sleep study? No  Do you snore loudly (loud enough to be heard through closed doors)?  1  Do you often feel tired, fatigued, or sleepy during the daytime? 0  Has anyone observed you stop breathing during your sleep? 0  Do you have, or are you being treated for high blood pressure? 1  BMI more than 35 kg/m2? 1  Age over 67 years old? 1  Neck circumference greater than 40 cm/18 inches? 0 (16)  Gender: 0  Obstructive Sleep Apnea Score 4  Score 4 or greater  Results sent to PCP

## 2013-04-01 NOTE — H&P (Signed)
TOTAL KNEE ADMISSION H&P  Patient is being admitted for left total knee arthroplasty.  Subjective:  Chief Complaint:left knee pain.  HPI: Teresa Stanley, 68 y.o. female, has a history of pain and functional disability in the left knee due to arthritis and has failed non-surgical conservative treatments for greater than 12 weeks to includeNSAID's and/or analgesics, corticosteriod injections, viscosupplementation injections and use of assistive devices.  Onset of symptoms was gradual, starting 3 years ago with gradually worsening course since that time. The patient noted no past surgery on the left knee(s).  Patient currently rates pain in the left knee(s) at 8 out of 10 with activity. Patient has night pain, worsening of pain with activity and weight bearing and pain that interferes with activities of daily living.  Patient has evidence of subchondral sclerosis, periarticular osteophytes and joint space narrowing by imaging studies. This patient has had progressive valgus deformity measuring 21 degrees on standing radiographs.. There is no active infection.  Patient Active Problem List   Diagnosis Date Noted  . Candidal intertrigo 08/09/2012  . Hypertension 12/15/2011  . Diabetes mellitus 12/15/2011   Past Medical History  Diagnosis Date  . Hyperlipidemia     takes Atorvastatin daily  . Hypertension     takes Lisinopril,Amlodipine,and Metoprolol daily  . GERD (gastroesophageal reflux disease)     takes Omeprazole daily  . Headache(784.0)     occasionally  . Diabetes mellitus     takes Actos and Metformin daily and Victoza  . Arthritis   . Joint pain     Past Surgical History  Procedure Laterality Date  . Abdominal hysterectomy    . Breast biopsy Left   . Cardiac catheterization  2009  . Cataract surgery Right     No prescriptions prior to admission   No Known Allergies  History  Substance Use Topics  . Smoking status: Never Smoker   . Smokeless tobacco: Not on file  .  Alcohol Use: No    Family History  Problem Relation Age of Onset  . Diabetes Mother   . Diabetes Father   . Asthma Father      Review of Systems  Musculoskeletal: Positive for joint pain.  All other systems reviewed and are negative.    Objective:  Physical Exam  Constitutional: She is oriented to person, place, and time. She appears well-developed and well-nourished.  HENT:  Head: Normocephalic and atraumatic.  Eyes: EOM are normal. Pupils are equal, round, and reactive to light.  Neck: Normal range of motion.  Cardiovascular: Normal rate.   Respiratory: Effort normal.  GI: Soft.  Musculoskeletal:  Left knee with 25 degree valgus deformity.  Lateral laxity with medial and lateral palpable osteophytes.    Neurological: She is alert and oriented to person, place, and time.  Skin: Skin is warm and dry.  Psychiatric: She has a normal mood and affect.    Vital signs in last 24 hours:    Labs:   Estimated body mass index is 51.24 kg/(m^2) as calculated from the following:   Height as of 08/09/12: 5\' 3"  (1.6 m).   Weight as of 08/09/12: 131.18 kg (289 lb 3.2 oz).   Imaging Review Plain radiographs demonstrate moderate degenerative joint disease of the left knee(s). The overall alignment issignificant valgus. The bone quality appears to be adequate for age and reported activity level.  Assessment/Plan:  End stage arthritis, left knee   The patient history, physical examination, clinical judgment of the provider and imaging studies are consistent  with end stage degenerative joint disease of the left knee(s) and total knee arthroplasty is deemed medically necessary. The treatment options including medical management, injection therapy arthroscopy and arthroplasty were discussed at length. The risks and benefits of total knee arthroplasty were presented and reviewed. The risks due to aseptic loosening, infection, stiffness, patella tracking problems, thromboembolic  complications and other imponderables were discussed. The patient acknowledged the explanation, agreed to proceed with the plan and consent was signed. Patient is being admitted for inpatient treatment for surgery, pain control, PT, OT, prophylactic antibiotics, VTE prophylaxis, progressive ambulation and ADL's and discharge planning. The patient is planning to be discharged home with home health services

## 2013-04-03 MED ORDER — DEXTROSE 5 % IV SOLN
3.0000 g | INTRAVENOUS | Status: AC
  Administered 2013-04-04: 3 g via INTRAVENOUS
  Filled 2013-04-03: qty 3000

## 2013-04-04 ENCOUNTER — Inpatient Hospital Stay (HOSPITAL_COMMUNITY)
Admission: RE | Admit: 2013-04-04 | Discharge: 2013-04-08 | DRG: 470 | Disposition: A | Discharge status: Skilled Nursing Facility | Payer: Medicare Other | Source: Ambulatory Visit | Attending: Orthopaedic Surgery | Admitting: Orthopaedic Surgery

## 2013-04-04 ENCOUNTER — Encounter (HOSPITAL_COMMUNITY): Payer: Self-pay | Admitting: Certified Registered Nurse Anesthetist

## 2013-04-04 ENCOUNTER — Encounter (HOSPITAL_COMMUNITY)
Admission: RE | Disposition: A | Discharge status: Skilled Nursing Facility | Payer: Medicare Other | Source: Ambulatory Visit | Attending: Orthopaedic Surgery

## 2013-04-04 ENCOUNTER — Inpatient Hospital Stay (HOSPITAL_COMMUNITY): Payer: Medicare Other | Admitting: Anesthesiology

## 2013-04-04 ENCOUNTER — Encounter (HOSPITAL_COMMUNITY): Payer: Medicare Other | Admitting: Anesthesiology

## 2013-04-04 DIAGNOSIS — Z825 Family history of asthma and other chronic lower respiratory diseases: Secondary | ICD-10-CM

## 2013-04-04 DIAGNOSIS — E119 Type 2 diabetes mellitus without complications: Secondary | ICD-10-CM | POA: Diagnosis present

## 2013-04-04 DIAGNOSIS — I10 Essential (primary) hypertension: Secondary | ICD-10-CM | POA: Diagnosis present

## 2013-04-04 DIAGNOSIS — K219 Gastro-esophageal reflux disease without esophagitis: Secondary | ICD-10-CM | POA: Diagnosis present

## 2013-04-04 DIAGNOSIS — M1711 Unilateral primary osteoarthritis, right knee: Secondary | ICD-10-CM | POA: Diagnosis present

## 2013-04-04 DIAGNOSIS — Z833 Family history of diabetes mellitus: Secondary | ICD-10-CM

## 2013-04-04 DIAGNOSIS — M1712 Unilateral primary osteoarthritis, left knee: Secondary | ICD-10-CM

## 2013-04-04 DIAGNOSIS — M1710 Unilateral primary osteoarthritis, unspecified knee: Principal | ICD-10-CM | POA: Diagnosis present

## 2013-04-04 DIAGNOSIS — E785 Hyperlipidemia, unspecified: Secondary | ICD-10-CM | POA: Diagnosis present

## 2013-04-04 HISTORY — PX: KNEE ARTHROPLASTY: SHX992

## 2013-04-04 LAB — GLUCOSE, CAPILLARY
GLUCOSE-CAPILLARY: 112 mg/dL — AB (ref 70–99)
GLUCOSE-CAPILLARY: 169 mg/dL — AB (ref 70–99)
GLUCOSE-CAPILLARY: 250 mg/dL — AB (ref 70–99)

## 2013-04-04 SURGERY — ARTHROPLASTY, KNEE, TOTAL, USING IMAGELESS COMPUTER-ASSISTED NAVIGATION
Anesthesia: Regional | Site: Knee | Laterality: Left

## 2013-04-04 MED ORDER — ROCURONIUM BROMIDE 100 MG/10ML IV SOLN
INTRAVENOUS | Status: DC | PRN
  Administered 2013-04-04: 50 mg via INTRAVENOUS

## 2013-04-04 MED ORDER — METOCLOPRAMIDE HCL 5 MG PO TABS
5.0000 mg | ORAL_TABLET | Freq: Three times a day (TID) | ORAL | Status: DC | PRN
  Filled 2013-04-04: qty 2

## 2013-04-04 MED ORDER — HYDROMORPHONE HCL PF 1 MG/ML IJ SOLN
INTRAMUSCULAR | Status: AC
  Administered 2013-04-04: 0.5 mg via INTRAVENOUS
  Filled 2013-04-04: qty 1

## 2013-04-04 MED ORDER — STERILE WATER FOR INJECTION IJ SOLN
INTRAMUSCULAR | Status: AC
  Filled 2013-04-04: qty 10

## 2013-04-04 MED ORDER — PROMETHAZINE HCL 25 MG/ML IJ SOLN: 6.2500 mg | INTRAMUSCULAR | Status: DC | PRN

## 2013-04-04 MED ORDER — PIOGLITAZONE HCL 30 MG PO TABS
30.0000 mg | ORAL_TABLET | Freq: Every day | ORAL | Status: DC
  Administered 2013-04-05 – 2013-04-08 (×4): 30 mg via ORAL
  Filled 2013-04-04 (×5): qty 1

## 2013-04-04 MED ORDER — PROPOFOL 10 MG/ML IV BOLUS
INTRAVENOUS | Status: AC
  Filled 2013-04-04: qty 20

## 2013-04-04 MED ORDER — ONDANSETRON HCL 4 MG/2ML IJ SOLN
INTRAMUSCULAR | Status: DC | PRN
  Administered 2013-04-04: 4 mg via INTRAVENOUS

## 2013-04-04 MED ORDER — METOCLOPRAMIDE HCL 5 MG/ML IJ SOLN: 5.0000 mg | Freq: Three times a day (TID) | INTRAMUSCULAR | Status: DC | PRN

## 2013-04-04 MED ORDER — PHENYLEPHRINE 40 MCG/ML (10ML) SYRINGE FOR IV PUSH (FOR BLOOD PRESSURE SUPPORT)
PREFILLED_SYRINGE | INTRAVENOUS | Status: AC
  Filled 2013-04-04: qty 10

## 2013-04-04 MED ORDER — METFORMIN HCL 500 MG PO TABS
1000.0000 mg | ORAL_TABLET | Freq: Two times a day (BID) | ORAL | Status: DC
  Administered 2013-04-05 – 2013-04-08 (×7): 1000 mg via ORAL
  Filled 2013-04-04 (×9): qty 2

## 2013-04-04 MED ORDER — METHOCARBAMOL 500 MG PO TABS
500.0000 mg | ORAL_TABLET | Freq: Four times a day (QID) | ORAL | Status: DC | PRN
Start: 2013-04-04 — End: 2013-07-17

## 2013-04-04 MED ORDER — OXYCODONE HCL 5 MG PO TABS: 5.0000 mg | ORAL_TABLET | Freq: Once | ORAL | Status: DC | PRN

## 2013-04-04 MED ORDER — OXYCODONE HCL 5 MG/5ML PO SOLN: 5.0000 mg | Freq: Once | ORAL | Status: DC | PRN

## 2013-04-04 MED ORDER — LACTATED RINGERS IV SOLN: INTRAVENOUS | Status: DC

## 2013-04-04 MED ORDER — MENTHOL 3 MG MT LOZG: 1.0000 | LOZENGE | OROMUCOSAL | Status: DC | PRN

## 2013-04-04 MED ORDER — NEOSTIGMINE METHYLSULFATE 1 MG/ML IJ SOLN
INTRAMUSCULAR | Status: DC | PRN
  Administered 2013-04-04: 5 mg via INTRAVENOUS

## 2013-04-04 MED ORDER — ACETAMINOPHEN 325 MG PO TABS
650.0000 mg | ORAL_TABLET | Freq: Four times a day (QID) | ORAL | Status: DC | PRN
Start: 2013-04-04 — End: 2013-04-08

## 2013-04-04 MED ORDER — INSULIN ASPART 100 UNIT/ML ~~LOC~~ SOLN
0.0000 [IU] | Freq: Three times a day (TID) | SUBCUTANEOUS | Status: DC
  Administered 2013-04-05: 5 [IU] via SUBCUTANEOUS
  Administered 2013-04-05: 3 [IU] via SUBCUTANEOUS
  Administered 2013-04-05: 2 [IU] via SUBCUTANEOUS
  Administered 2013-04-06 (×3): 3 [IU] via SUBCUTANEOUS
  Administered 2013-04-07: 5 [IU] via SUBCUTANEOUS
  Administered 2013-04-07: 3 [IU] via SUBCUTANEOUS
  Administered 2013-04-07: 8 [IU] via SUBCUTANEOUS
  Administered 2013-04-08 (×2): 3 [IU] via SUBCUTANEOUS

## 2013-04-04 MED ORDER — ASPIRIN EC 325 MG PO TBEC: 325.0000 mg | DELAYED_RELEASE_TABLET | Freq: Every day | ORAL | Status: DC

## 2013-04-04 MED ORDER — SODIUM CHLORIDE 0.9 % IR SOLN
Status: DC | PRN
  Administered 2013-04-04: 3000 mL

## 2013-04-04 MED ORDER — GLYCOPYRROLATE 0.2 MG/ML IJ SOLN
INTRAMUSCULAR | Status: DC | PRN
  Administered 2013-04-04: .8 mg via INTRAVENOUS

## 2013-04-04 MED ORDER — PIOGLITAZONE HCL 30 MG PO TABS: 30.0000 mg | ORAL_TABLET | Freq: Every day | ORAL | Status: DC

## 2013-04-04 MED ORDER — LIRAGLUTIDE 18 MG/3ML ~~LOC~~ SOPN: 1.8000 mg | PEN_INJECTOR | Freq: Every morning | SUBCUTANEOUS | Status: DC

## 2013-04-04 MED ORDER — MORPHINE SULFATE 2 MG/ML IJ SOLN
2.0000 mg | INTRAMUSCULAR | Status: DC | PRN
  Administered 2013-04-04 – 2013-04-05 (×3): 2 mg via INTRAVENOUS
  Filled 2013-04-04 (×3): qty 1

## 2013-04-04 MED ORDER — LABETALOL HCL 5 MG/ML IV SOLN
INTRAVENOUS | Status: DC | PRN
  Administered 2013-04-04 (×2): 5 mg via INTRAVENOUS

## 2013-04-04 MED ORDER — LIDOCAINE HCL (CARDIAC) 20 MG/ML IV SOLN
INTRAVENOUS | Status: DC | PRN
  Administered 2013-04-04: 100 mg via INTRAVENOUS

## 2013-04-04 MED ORDER — METHOCARBAMOL 500 MG PO TABS
500.0000 mg | ORAL_TABLET | Freq: Four times a day (QID) | ORAL | Status: DC | PRN
  Administered 2013-04-04 – 2013-04-08 (×7): 500 mg via ORAL
  Filled 2013-04-04 (×7): qty 1

## 2013-04-04 MED ORDER — LISINOPRIL 20 MG PO TABS
20.0000 mg | ORAL_TABLET | Freq: Two times a day (BID) | ORAL | Status: DC
  Administered 2013-04-04 – 2013-04-08 (×8): 20 mg via ORAL
  Filled 2013-04-04 (×9): qty 1

## 2013-04-04 MED ORDER — PHENOL 1.4 % MT LIQD: 1.0000 | OROMUCOSAL | Status: DC | PRN

## 2013-04-04 MED ORDER — FLEET ENEMA 7-19 GM/118ML RE ENEM: 1.0000 | ENEMA | Freq: Once | RECTAL | Status: AC | PRN

## 2013-04-04 MED ORDER — ONDANSETRON HCL 4 MG PO TABS: 4.0000 mg | ORAL_TABLET | Freq: Four times a day (QID) | ORAL | Status: DC | PRN

## 2013-04-04 MED ORDER — PANTOPRAZOLE SODIUM 40 MG PO TBEC
40.0000 mg | DELAYED_RELEASE_TABLET | Freq: Every day | ORAL | Status: DC
  Administered 2013-04-05 – 2013-04-08 (×4): 40 mg via ORAL
  Filled 2013-04-04 (×4): qty 1

## 2013-04-04 MED ORDER — LIDOCAINE HCL (CARDIAC) 20 MG/ML IV SOLN
INTRAVENOUS | Status: AC
  Filled 2013-04-04: qty 5

## 2013-04-04 MED ORDER — FENTANYL CITRATE 0.05 MG/ML IJ SOLN
50.0000 ug | Freq: Once | INTRAMUSCULAR | Status: AC
  Administered 2013-04-04: 50 ug via INTRAVENOUS
  Filled 2013-04-04: qty 2

## 2013-04-04 MED ORDER — BISACODYL 10 MG RE SUPP: 10.0000 mg | Freq: Every day | RECTAL | Status: DC | PRN

## 2013-04-04 MED ORDER — ASPIRIN EC 325 MG PO TBEC
325.0000 mg | DELAYED_RELEASE_TABLET | Freq: Every day | ORAL | Status: DC
  Administered 2013-04-05 – 2013-04-07 (×3): 325 mg via ORAL
  Filled 2013-04-04 (×4): qty 1

## 2013-04-04 MED ORDER — METOPROLOL TARTRATE 100 MG PO TABS
100.0000 mg | ORAL_TABLET | Freq: Two times a day (BID) | ORAL | Status: DC
  Administered 2013-04-04 – 2013-04-08 (×8): 100 mg via ORAL
  Filled 2013-04-04 (×9): qty 1

## 2013-04-04 MED ORDER — ATORVASTATIN CALCIUM 20 MG PO TABS
20.0000 mg | ORAL_TABLET | Freq: Every day | ORAL | Status: DC
  Administered 2013-04-05 – 2013-04-07 (×3): 20 mg via ORAL
  Filled 2013-04-04 (×4): qty 1

## 2013-04-04 MED ORDER — POTASSIUM CHLORIDE IN NACL 20-0.45 MEQ/L-% IV SOLN
INTRAVENOUS | Status: DC
  Administered 2013-04-04: 21:00:00 via INTRAVENOUS
  Filled 2013-04-04 (×5): qty 1000

## 2013-04-04 MED ORDER — GLYCOPYRROLATE 0.2 MG/ML IJ SOLN
INTRAMUSCULAR | Status: AC
  Filled 2013-04-04: qty 4

## 2013-04-04 MED ORDER — LACTATED RINGERS IV SOLN
INTRAVENOUS | Status: DC | PRN
  Administered 2013-04-04 (×2): via INTRAVENOUS

## 2013-04-04 MED ORDER — DEXAMETHASONE SODIUM PHOSPHATE 10 MG/ML IJ SOLN
INTRAMUSCULAR | Status: DC | PRN
  Administered 2013-04-04: 4 mg

## 2013-04-04 MED ORDER — MIDAZOLAM HCL 2 MG/2ML IJ SOLN
INTRAMUSCULAR | Status: AC
  Filled 2013-04-04: qty 2

## 2013-04-04 MED ORDER — ONDANSETRON HCL 4 MG/2ML IJ SOLN
INTRAMUSCULAR | Status: AC
  Filled 2013-04-04: qty 2

## 2013-04-04 MED ORDER — PROPOFOL 10 MG/ML IV BOLUS
INTRAVENOUS | Status: DC | PRN
  Administered 2013-04-04: 150 mg via INTRAVENOUS

## 2013-04-04 MED ORDER — ACETAMINOPHEN 650 MG RE SUPP: 650.0000 mg | Freq: Four times a day (QID) | RECTAL | Status: DC | PRN

## 2013-04-04 MED ORDER — FENTANYL CITRATE 0.05 MG/ML IJ SOLN
INTRAMUSCULAR | Status: AC
  Filled 2013-04-04: qty 5

## 2013-04-04 MED ORDER — DOCUSATE SODIUM 100 MG PO CAPS
100.0000 mg | ORAL_CAPSULE | Freq: Two times a day (BID) | ORAL | Status: DC
  Administered 2013-04-04 – 2013-04-08 (×8): 100 mg via ORAL
  Filled 2013-04-04 (×8): qty 1

## 2013-04-04 MED ORDER — OXYCODONE HCL 5 MG PO TABS
5.0000 mg | ORAL_TABLET | ORAL | Status: DC | PRN
  Administered 2013-04-04 – 2013-04-06 (×8): 10 mg via ORAL
  Administered 2013-04-07 (×2): 5 mg via ORAL
  Administered 2013-04-07 – 2013-04-08 (×5): 10 mg via ORAL
  Filled 2013-04-04 (×2): qty 2
  Filled 2013-04-04: qty 1
  Filled 2013-04-04 (×11): qty 2
  Filled 2013-04-04: qty 1
  Filled 2013-04-04 (×4): qty 2
  Filled 2013-04-04: qty 1

## 2013-04-04 MED ORDER — OXYCODONE-ACETAMINOPHEN 5-325 MG PO TABS: 1.0000 | ORAL_TABLET | ORAL | Status: DC | PRN

## 2013-04-04 MED ORDER — FENTANYL CITRATE 0.05 MG/ML IJ SOLN
INTRAMUSCULAR | Status: DC | PRN
  Administered 2013-04-04 (×2): 25 ug via INTRAVENOUS
  Administered 2013-04-04: 50 ug via INTRAVENOUS
  Administered 2013-04-04: 100 ug via INTRAVENOUS

## 2013-04-04 MED ORDER — BUPIVACAINE-EPINEPHRINE PF 0.5-1:200000 % IJ SOLN
INTRAMUSCULAR | Status: DC | PRN
  Administered 2013-04-04: 25 mL

## 2013-04-04 MED ORDER — PHENYLEPHRINE HCL 10 MG/ML IJ SOLN
INTRAMUSCULAR | Status: DC | PRN
  Administered 2013-04-04: 80 ug via INTRAVENOUS
  Administered 2013-04-04 (×5): 40 ug via INTRAVENOUS

## 2013-04-04 MED ORDER — MIDAZOLAM HCL 2 MG/2ML IJ SOLN
1.0000 mg | INTRAMUSCULAR | Status: DC | PRN
  Administered 2013-04-04: 1 mg via INTRAVENOUS

## 2013-04-04 MED ORDER — METHOCARBAMOL 100 MG/ML IJ SOLN
500.0000 mg | Freq: Four times a day (QID) | INTRAVENOUS | Status: DC | PRN
  Filled 2013-04-04: qty 5

## 2013-04-04 MED ORDER — SENNOSIDES-DOCUSATE SODIUM 8.6-50 MG PO TABS: 1.0000 | ORAL_TABLET | Freq: Every evening | ORAL | Status: DC | PRN

## 2013-04-04 MED ORDER — ONDANSETRON HCL 4 MG/2ML IJ SOLN: 4.0000 mg | Freq: Four times a day (QID) | INTRAMUSCULAR | Status: DC | PRN

## 2013-04-04 MED ORDER — NEOSTIGMINE METHYLSULFATE 1 MG/ML IJ SOLN
INTRAMUSCULAR | Status: AC
  Filled 2013-04-04: qty 10

## 2013-04-04 MED ORDER — HYDROMORPHONE HCL PF 1 MG/ML IJ SOLN
0.2500 mg | INTRAMUSCULAR | Status: DC | PRN
  Administered 2013-04-04 (×2): 0.5 mg via INTRAVENOUS

## 2013-04-04 MED ORDER — AMLODIPINE BESYLATE 10 MG PO TABS
10.0000 mg | ORAL_TABLET | Freq: Every day | ORAL | Status: DC
  Administered 2013-04-05 – 2013-04-08 (×4): 10 mg via ORAL
  Filled 2013-04-04 (×4): qty 1

## 2013-04-04 MED ORDER — ROCURONIUM BROMIDE 50 MG/5ML IV SOLN
INTRAVENOUS | Status: AC
  Filled 2013-04-04: qty 1

## 2013-04-04 MED ORDER — EPHEDRINE SULFATE 50 MG/ML IJ SOLN
INTRAMUSCULAR | Status: AC
  Filled 2013-04-04: qty 1

## 2013-04-04 SURGICAL SUPPLY — 72 items
APL SKNCLS STERI-STRIP NONHPOA (GAUZE/BANDAGES/DRESSINGS) ×1
BANDAGE ELASTIC 4 VELCRO ST LF (GAUZE/BANDAGES/DRESSINGS) ×3 IMPLANT
BANDAGE ELASTIC 6 VELCRO ST LF (GAUZE/BANDAGES/DRESSINGS) ×2 IMPLANT
BANDAGE ESMARK 6X9 LF (GAUZE/BANDAGES/DRESSINGS) ×1 IMPLANT
BENZOIN TINCTURE PRP APPL 2/3 (GAUZE/BANDAGES/DRESSINGS) ×3 IMPLANT
BLADE SAGITTAL 25.0X1.19X90 (BLADE) ×2 IMPLANT
BLADE SAGITTAL 25.0X1.19X90MM (BLADE) ×1
BLADE SAW SGTL 13X75X1.27 (BLADE) ×3 IMPLANT
BNDG CMPR 9X6 STRL LF SNTH (GAUZE/BANDAGES/DRESSINGS) ×1
BNDG CMPR MED 10X6 ELC LF (GAUZE/BANDAGES/DRESSINGS) ×1
BNDG ELASTIC 6X10 VLCR STRL LF (GAUZE/BANDAGES/DRESSINGS) ×3 IMPLANT
BNDG ESMARK 6X9 LF (GAUZE/BANDAGES/DRESSINGS) ×3
BOWL SMART MIX CTS (DISPOSABLE) ×3 IMPLANT
CAPT RP KNEE ×2 IMPLANT
CEMENT HV SMART SET (Cement) ×6 IMPLANT
CLOSURE WOUND 1/2 X4 (GAUZE/BANDAGES/DRESSINGS) ×1
CLOTH BEACON ORANGE TIMEOUT ST (SAFETY) ×3 IMPLANT
COVER BACK TABLE 24X17X13 BIG (DRAPES) IMPLANT
COVER SURGICAL LIGHT HANDLE (MISCELLANEOUS) ×3 IMPLANT
CUFF TOURNIQUET SINGLE 34IN LL (TOURNIQUET CUFF) ×1 IMPLANT
CUFF TOURNIQUET SINGLE 44IN (TOURNIQUET CUFF) ×2 IMPLANT
DRAPE ORTHO SPLIT 77X108 STRL (DRAPES) ×6
DRAPE SURG ORHT 6 SPLT 77X108 (DRAPES) ×2 IMPLANT
DRAPE U-SHAPE 47X51 STRL (DRAPES) ×3 IMPLANT
DRSG PAD ABDOMINAL 8X10 ST (GAUZE/BANDAGES/DRESSINGS) ×6 IMPLANT
DURAPREP 26ML APPLICATOR (WOUND CARE) ×3 IMPLANT
ELECT REM PT RETURN 9FT ADLT (ELECTROSURGICAL) ×3
ELECTRODE REM PT RTRN 9FT ADLT (ELECTROSURGICAL) ×1 IMPLANT
FACESHIELD LNG OPTICON STERILE (SAFETY) ×3 IMPLANT
GAUZE XEROFORM 5X9 LF (GAUZE/BANDAGES/DRESSINGS) ×3 IMPLANT
GLOVE BIOGEL PI IND STRL 7.5 (GLOVE) ×1 IMPLANT
GLOVE BIOGEL PI IND STRL 8 (GLOVE) ×1 IMPLANT
GLOVE BIOGEL PI INDICATOR 7.5 (GLOVE) ×2
GLOVE BIOGEL PI INDICATOR 8 (GLOVE) ×2
GLOVE ECLIPSE 7.0 STRL STRAW (GLOVE) ×3 IMPLANT
GLOVE ORTHO TXT STRL SZ7.5 (GLOVE) ×3 IMPLANT
GOWN PREVENTION PLUS LG XLONG (DISPOSABLE) IMPLANT
GOWN PREVENTION PLUS XLARGE (GOWN DISPOSABLE) ×3 IMPLANT
GOWN STRL NON-REIN LRG LVL3 (GOWN DISPOSABLE) ×9 IMPLANT
HANDPIECE INTERPULSE COAX TIP (DISPOSABLE) ×3
IMMOBILIZER KNEE 22 UNIV (SOFTGOODS) ×3 IMPLANT
KIT BASIN OR (CUSTOM PROCEDURE TRAY) ×3 IMPLANT
KIT ROOM TURNOVER OR (KITS) ×3 IMPLANT
MANIFOLD NEPTUNE II (INSTRUMENTS) ×3 IMPLANT
MARKER SPHERE PSV REFLC THRD 5 (MARKER) ×9 IMPLANT
NDL 1/2 CIR MAYO (NEEDLE) ×1 IMPLANT
NDL HYPO 25GX1X1/2 BEV (NEEDLE) ×1 IMPLANT
NEEDLE 1/2 CIR MAYO (NEEDLE) ×3 IMPLANT
NEEDLE HYPO 25GX1X1/2 BEV (NEEDLE) ×3 IMPLANT
NS IRRIG 1000ML POUR BTL (IV SOLUTION) ×3 IMPLANT
PACK TOTAL JOINT (CUSTOM PROCEDURE TRAY) ×3 IMPLANT
PAD ABD 8X10 STRL (GAUZE/BANDAGES/DRESSINGS) ×2 IMPLANT
PAD ARMBOARD 7.5X6 YLW CONV (MISCELLANEOUS) ×6 IMPLANT
PAD CAST 4YDX4 CTTN HI CHSV (CAST SUPPLIES) ×1 IMPLANT
PADDING CAST COTTON 4X4 STRL (CAST SUPPLIES) ×3
PADDING CAST COTTON 6X4 STRL (CAST SUPPLIES) ×3 IMPLANT
PIN SCHANZ 4MM 130MM (PIN) ×12 IMPLANT
SET HNDPC FAN SPRY TIP SCT (DISPOSABLE) ×1 IMPLANT
SPONGE GAUZE 4X4 12PLY (GAUZE/BANDAGES/DRESSINGS) ×3 IMPLANT
STAPLER VISISTAT 35W (STAPLE) ×3 IMPLANT
STRIP CLOSURE SKIN 1/2X4 (GAUZE/BANDAGES/DRESSINGS) ×3 IMPLANT
SUCTION FRAZIER TIP 10 FR DISP (SUCTIONS) ×3 IMPLANT
SUT ETHIBOND NAB CT1 #1 30IN (SUTURE) ×3 IMPLANT
SUT VIC AB 2-0 CT1 27 (SUTURE) ×9
SUT VIC AB 2-0 CT1 TAPERPNT 27 (SUTURE) ×2 IMPLANT
SUT VICRYL 4-0 PS2 18IN ABS (SUTURE) ×3 IMPLANT
SUT VICRYL AB 2 0 TIES (SUTURE) ×3 IMPLANT
SYR CONTROL 10ML LL (SYRINGE) ×3 IMPLANT
TOWEL OR 17X24 6PK STRL BLUE (TOWEL DISPOSABLE) ×3 IMPLANT
TOWEL OR 17X26 10 PK STRL BLUE (TOWEL DISPOSABLE) ×3 IMPLANT
TRAY FOLEY CATH 16FRSI W/METER (SET/KITS/TRAYS/PACK) ×3 IMPLANT
WATER STERILE IRR 1000ML POUR (IV SOLUTION) ×9 IMPLANT

## 2013-04-04 NOTE — Brief Op Note (Cosign Needed)
04/04/2013  2:54 PM  PATIENT:  [name]  68 y.o. female  PRE-OPERATIVE DIAGNOSIS:  Left Knee Osteoarthritis   POST-OPERATIVE DIAGNOSIS:  Left Knee osteoarthritis  PROCEDURE:  Procedure(s) with comments: COMPUTER ASSISTED TOTAL KNEE ARTHROPLASTY (Left) - Left Total Knee Arthroplasty, Cemented  SURGEON:  Surgeon(s) and Role:    * Marybelle Killings, MD - Primary  PHYSICIAN ASSISTANT: [facility]   ASSISTANTS: none   ANESTHESIA:   general  EBL:  Total I/O In: 1000 [I.V.:1000] Out: 100 [Blood:100]  BLOOD ADMINISTERED:none  DRAINS: none   LOCAL MEDICATIONS USED:  NONE  SPECIMEN:  No Specimen  DISPOSITION OF SPECIMEN:  N/A  COUNTS:  YES  TOURNIQUET:   Total Tourniquet Time Documented: Thigh (Left) - 55 minutes Total: Thigh (Left) - 55 minutes   DICTATION: .Note written in EPIC  PLAN OF CARE: Admit to inpatient   PATIENT DISPOSITION:  PACU - hemodynamically stable.   Delay start of Pharmacological VTE agent (>24hrs) due to surgical blood loss or risk of bleeding: no

## 2013-04-04 NOTE — Progress Notes (Signed)
Orthopedic Tech Progress Note Patient Details:  Teresa Stanley 1945-11-09 [id number]  CPM Left Knee CPM Left Knee: On Left Knee Flexion (Degrees): 60 Left Knee Extension (Degrees): 0 Additional Comments: ohf will be provided when one becomes available   Hildred Priest 04/04/2013, 4:09 PM

## 2013-04-04 NOTE — Anesthesia Procedure Notes (Addendum)
Anesthesia Regional Block:  Femoral nerve block  Pre-Anesthetic Checklist: ,, timeout performed, Correct Patient, Correct Site, Correct Laterality, Correct Procedure, Correct Position, site marked, Risks and benefits discussed,  Surgical consent,  Pre-op evaluation,  At surgeon's request and post-op pain management  Laterality: Left  Prep: chloraprep       Needles:   Needle Type: Echogenic Stimulator Needle     Needle Length: 9cm 9 cm Needle Gauge: 22 and 22 G    Additional Needles:  Procedures: ultrasound guided (picture in chart) Femoral nerve block Narrative:  Start time: 04/04/2013 11:19 AM End time: 04/04/2013 11:32 AM Injection made incrementally with aspirations every 5 mL. Anesthesiologist: Dr Chriss Driver  Additional Notes: 5643-3295 L Fem Nerve Block POP CHG prep, sterile tech Good [place] visualization-PIX in chart Teresa Stanley .5% w/epi 1:200000 25cc+decadron 4mg  infil Multiple neg asp No compl Dr Chriss Driver   Procedure Name: Intubation Date/Time: 04/04/2013 12:44 PM Performed by: Blair Heys E Pre-anesthesia Checklist: Patient identified, Emergency Drugs available, Suction available and Patient being monitored Patient Re-evaluated:Patient Re-evaluated prior to inductionOxygen Delivery Method: Circle system utilized Preoxygenation: Pre-oxygenation with 100% oxygen Intubation Type: IV induction Ventilation: Mask ventilation without difficulty and Oral airway inserted - appropriate to patient size Grade View: Grade I Tube type: Oral Tube size: 7.5 mm Number of attempts: 1 Airway Equipment and Method: Stylet,  Video-laryngoscopy and Oral airway Placement Confirmation: ETT inserted through vocal cords under direct vision,  positive ETCO2 and breath sounds checked- equal and bilateral Secured at: 21 cm Tube secured with: Tape Dental Injury: Teeth and Oropharynx as per pre-operative assessment

## 2013-04-04 NOTE — Anesthesia Postprocedure Evaluation (Signed)
  Anesthesia Post-op Note  Patient: Teresa Stanley  Procedure(s) Performed: Procedure(s) with comments: COMPUTER ASSISTED TOTAL KNEE ARTHROPLASTY (Left) - Left Total Knee Arthroplasty, Cemented  Patient Location: PACU  Anesthesia Type:GA combined with regional for post-op pain  Level of Consciousness: awake and alert   Airway and Oxygen Therapy: Patient Spontanous Breathing  Post-op Pain: mild  Post-op Assessment: Post-op Vital signs reviewed, Patient's Cardiovascular Status Stable, Respiratory Function Stable, Patent Airway, No signs of Nausea or vomiting and Pain level controlled  Post-op Vital Signs: Reviewed and stable  Complications: No apparent anesthesia complications

## 2013-04-04 NOTE — Op Note (Signed)
Test test test  Preop diagnosis: Left knee osteoarthritis with valgus deformity .  Postop diagnosis: Same  Procedure: Left cemented total knee arthroplasty, computer assist  Surgeon: Rodell Perna M.D.  Assistant: Phillips Hay PA-C medically necessary and present for the entire procedure  Anesthesia preoperative femoral nerve block with general oral tracheal glide scope intubation  Tourniquet time 55 minutes x400 mm pressure  Complications: None  Components Depew #3 LCS femur with lugs, 10 mm LCS rotating platform, #3 cemented tibia, cemented 3 peg all poly-patellar button  Procedure: After induction general anesthesia preoperative 3 g Ancef prophylaxis the patient's the short stature and high BMI of the largest size tourniquet available selected applied to the upper thigh had be were applied several times due to slipping down. Was applied over stockinette 10:15 drape was applied and DuraPrep was used. The usual total knee sheets drapes sterile skin marker Betadine Steri-Drape impervious stockinette Caban had all been applied. Timeout procedure was completed.  Leg was wrapped an Esmarch tourniquet and inflated. Midline incision was made. Deep retinaculum incision was split splitting the quad tendon between the medial one third and lateral two thirds. Medial parapatellar deep incision was made patella was everted cut removing 10 mm of articular surface from facet the facet. There was eburnated bone. Pins were inserted inside the incision the femur stab incision mid-tibia and computer Mauser generated. This showed hyperextension of 8 valgus deformity of 14.5. There was severe erosive where and the lateral compartment and 1.5 cm osteophytes. Menisci were resected cruciate was resected. Changes from previous cortisone injections were noted. Publishing rights manager generated and 9 mm were taken off the distal femur taking significantly more of the medial femoral condyle the lateral syndesmosis deformity was  in the femur with erosive where the lateral femoral condyle. Chamfer cuts were made sizing for a #3 large show one half centimeter osteophytes were removed off both sides of the femur. Posterior spurs removed Precor curved osteotome which are primarily on the lateral condyle posteriorly. PCL was completely resected. Verification anterior cut and the intercostal was flush with the anterior cortex of the femur. Tibia was cut removing a off the high 2.5 off the low. He'll preparation #3 size. A little but of additional bone after verification and be remove posterior and lateral. With the knee flexed the cut was exactly parallel to the floor. He'll preparation was made trials inserts showed less than 1 mm difference in flexion-extension with good collateral balance. Popliteus been resected laterally and some the posterior capsule been stripped posteriorly off the femur on the lateral side only. The came out full extension and flexion extension less than 1 mm difference in balance. Lateral side slightly tighter than the medial. Trials removed permanent components open postop lavage preparation for the bone drying cementing of the tibia followed by femur insertion of the 10 mm Depew [employer] LCS rotating platform spacer and 56mm patello-held with the patellar clamp. All excessive cement was removed. Tourniquet was deflated 1 cement was hard hemostasis was obtained. With the exposure post lateral corporeal it was tight due to the patient's short stature the top 1 cm of the patellar tendon it peeled away from the bone. 2 drill holes were placed with #2 FiberWire sutures plus of the tendon for some additional securement. Majority portion distally of the patellar tendon was all completely attached and standard closure with nonabsorbable #1 in the deep fascia 20 subtendinous tissue superficial retinaculum and subcuticular skin closure. 2-0 Vicryl and the portals mid-tibia postoperative  dressing and knee immobilizer.  Instrument count needle count was correct signed Wanda Plump.D.

## 2013-04-04 NOTE — Interval H&P Note (Signed)
History and Physical Interval Note:  04/04/2013 12:13 PM  Teresa Stanley  has presented today for surgery, with the diagnosis of Left Knee Osteoarthritis   The various methods of treatment have been discussed with the patient and family. After consideration of risks, benefits and other options for treatment, the patient has consented to  Procedure(s) with comments: COMPUTER ASSISTED TOTAL KNEE ARTHROPLASTY (Left) - Left Total Knee Arthroplasty, Cemented as a surgical intervention .  The patient's history has been reviewed, patient examined, no change in status, stable for surgery.  I have reviewed the patient's chart and labs.  Questions were answered to the patient's satisfaction.     YATES,MARK C

## 2013-04-04 NOTE — Anesthesia Preprocedure Evaluation (Addendum)
Anesthesia Evaluation  Patient identified by MRN, date of birth, ID band Patient awake    Reviewed: Allergy & Precautions, H&P , NPO status , Patient's Chart, lab work & pertinent test results  Airway Mallampati: II TM Distance: >3 FB Neck ROM: Full    Dental  (+) Dental Advisory Given, Edentulous Upper, Edentulous Lower   Pulmonary  breath sounds clear to auscultation        Cardiovascular hypertension, Pt. on medications Rhythm:Regular Rate:Normal     Neuro/Psych  Headaches,    GI/Hepatic GERD-  Medicated,  Endo/Other  diabetes, Oral Hypoglycemic AgentsMorbid obesity  Renal/GU      Musculoskeletal  (+) Arthritis -,   Abdominal (+) + obese,   Peds  Hematology   Anesthesia Other Findings   Reproductive/Obstetrics                       Anesthesia Physical Anesthesia Plan  ASA: III  Anesthesia Plan: General and Regional   Post-op Pain Management:    Induction: Intravenous  Airway Management Planned: Oral ETT and Video Laryngoscope Planned  Additional Equipment:   Intra-op Plan:   Post-operative Plan: Extubation in OR  Informed Consent: I have reviewed the patients History and Physical, chart, labs and discussed the procedure including the risks, benefits and alternatives for the proposed anesthesia with the patient or authorized representative who has indicated his/her understanding and acceptance.   Dental advisory given  Plan Discussed with: CRNA and Surgeon  Anesthesia Plan Comments:      Anesthesia Quick Evaluation

## 2013-04-04 NOTE — Progress Notes (Signed)
Dr. Chriss Driver notified of pts BP

## 2013-04-04 NOTE — Transfer of Care (Signed)
Immediate Anesthesia Transfer of Care Note  Patient: Teresa Stanley  Procedure(s) Performed: Procedure(s) with comments: COMPUTER ASSISTED TOTAL KNEE ARTHROPLASTY (Left) - Left Total Knee Arthroplasty, Cemented  Patient Location: PACU  Anesthesia Type:General and regional  Level of Consciousness: awake and alert   Airway & Oxygen Therapy: Patient Spontanous Breathing and Patient connected to nasal cannula oxygen  Post-op Assessment: Report given to PACU RN, Post -op Vital signs reviewed and stable and Patient moving all extremities X 4  Post vital signs: Reviewed and stable  Complications: No apparent anesthesia complications

## 2013-04-04 NOTE — Progress Notes (Signed)
Utilization review completed.  

## 2013-04-04 NOTE — Preoperative (Signed)
Beta Blockers   Reason not to administer Beta Blockers:Not Applicable, patient took Metoprolol 04/04/13.

## 2013-04-05 ENCOUNTER — Encounter (HOSPITAL_COMMUNITY): Payer: Self-pay | Admitting: Orthopaedic Surgery

## 2013-04-05 LAB — BASIC METABOLIC PANEL
BUN: 26 mg/dL — ABNORMAL HIGH (ref 6–23)
CHLORIDE: 99 meq/L (ref 96–112)
CO2: 27 meq/L (ref 19–32)
Calcium: 8.6 mg/dL (ref 8.4–10.5)
Creatinine, Ser: 0.97 mg/dL (ref 0.50–1.10)
GFR calc Af Amer: 69 mL/min — ABNORMAL LOW (ref >90)
GFR calc non Af Amer: 60 mL/min — ABNORMAL LOW (ref >90)
Glucose, Bld: 218 mg/dL — ABNORMAL HIGH (ref 70–99)
Potassium: 5.3 mEq/L (ref 3.7–5.3)
Sodium: 138 mEq/L (ref 137–147)

## 2013-04-05 LAB — GLUCOSE, CAPILLARY
GLUCOSE-CAPILLARY: 215 mg/dL — AB (ref 70–99)
GLUCOSE-CAPILLARY: 151 mg/dL — AB (ref 70–99)
GLUCOSE-CAPILLARY: 175 mg/dL — AB (ref 70–99)
Glucose-Capillary: 130 mg/dL — ABNORMAL HIGH (ref 70–99)

## 2013-04-05 LAB — CBC
HEMATOCRIT: 31.3 % — AB (ref 36.0–46.0)
Hemoglobin: 10.4 g/dL — ABNORMAL LOW (ref 12.0–15.0)
MCH: 30.1 pg (ref 26.0–34.0)
MCHC: 33.2 g/dL (ref 30.0–36.0)
MCV: 90.7 fL (ref 78.0–100.0)
PLATELETS: 216 10*3/uL (ref 150–400)
RBC: 3.45 MIL/uL — AB (ref 3.87–5.11)
RDW: 15.1 % (ref 11.5–15.5)
WBC: 11 10*3/uL — AB (ref 4.0–10.5)

## 2013-04-05 NOTE — Evaluation (Signed)
Physical Therapy Evaluation Patient Details Name: Teresa Stanley MRN: [id number] DOB: Mar 06, 1945 Today's Date: 04/05/2013 Time: 7829-5621 PT Time Calculation (min): 35 min  PT Assessment / Plan / Recommendation History of Present Illness  Teresa Stanley, 68 y.o. female, has a history of pain and functional disability in the left knee due to arthritis and has failed non-surgical conservative treatments .  Onset of symptoms was gradual, starting 3 years ago with gradually worsening course since that time. The patient noted no past surgery on the left knee(s).  Patient currently rates pain in the left knee(s) at 8 out of 10 with activity..   This patient has had progressive valgus deformity measuring 21 degrees on standing radiographs.. She has no problems with her other knee.  Clinical Impression  Pt needed +2 assist today.  She has habit of leaning heavily on wlker for support.  Pt wants to discharge home with family assist - will see how she progresses and talk with family to see if they can handle her during her recovery - otherwise may need short term SNF    PT Assessment  Patient needs continued PT services    Follow Up Recommendations  SNF;Home health PT (Depending on progress and if family able to handle her)    Does the patient have the potential to tolerate intense rehabilitation      Barriers to Discharge   Pt wanting to go home with family assist.  Pt not here today due to the icy conditions.  Pt lives iwht husband and grown children nearby.  Pt has ramped entrance to house.  If pt not abel to progress fast enough may need short term SNF.  Will follow up with family and see how pt progressing.    Equipment Recommendations  None recommended by PT    Recommendations for Other Services     Frequency 7X/week    Precautions / Restrictions Precautions Precautions: Knee;Fall Required Braces or Orthoses: Knee Immobilizer - Left Knee Immobilizer - Left: On when out of bed or  walking Restrictions LLE Weight Bearing: Weight bearing as tolerated   Pertinent Vitals/Pain Pt hurting 8/10.  Ice pack applied      Mobility  Bed Mobility Overal bed mobility: Needs Assistance General bed mobility comments: Pt needed mod assist to get  out of bed.  hse needed help with her leg.   Pt needed cues for tehcnque Transfers Overall transfer level: Needs assistance Equipment used: Rolling walker (2 wheeled) General transfer comment: Pt needed +2 assist to get up to her feet from the bed and to transfer with walker to the bed Ambulation/Gait General Gait Details: pt unabel to ambulate on eval.  pt just transfered to her chair    Exercises Total Joint Exercises Ankle Circles/Pumps: AROM;Both;10 reps Quad Sets: AROM;Both;10 reps Gluteal Sets: AROM;Both;10 reps Long Arc Quad: AAROM;Left;5 reps Goniometric ROM: pt with 0 to 50 degrees knee PROM   PT Diagnosis: Difficulty walking;Acute pain  PT Problem List: Decreased strength;Decreased range of motion;Decreased activity tolerance;Decreased mobility;Decreased knowledge of use of DME;Decreased knowledge of precautions;Obesity;Pain PT Treatment Interventions: DME instruction;Gait training;Therapeutic activities;Therapeutic exercise;Patient/family education     PT Goals(Current goals can be found in the care plan section) Acute Rehab PT Goals Patient Stated Goal: To recover at home iwth family help.  get back to walking without hurting PT Goal Formulation: With patient Time For Goal Achievement: 04/19/13 Potential to Achieve Goals: Good  Visit Information  Last PT Received On: 04/05/13 Assistance Needed: +2 History of  Present Illness: Teresa Stanley, 68 y.o. female, has a history of pain and functional disability in the left knee due to arthritis and has failed non-surgical conservative treatments .  Onset of symptoms was gradual, starting 3 years ago with gradually worsening course since that time. The patient noted no past  surgery on the left knee(s).  Patient currently rates pain in the left knee(s) at 8 out of 10 with activity..   This patient has had progressive valgus deformity measuring 21 degrees on standing radiographs.. She has no problems with her other knee.       Prior [place] expects to be discharged to:: Private residence Living Arrangements: Spouse/significant other Available Help at Discharge: Available 24 hours/day;Family Type of Home: House Home Access: [place]: One [place]: [place] - 2 wheels Additional Comments: Family not able to be here today due to the ice.  Pt lives with her husband and has sons that help out Prior Function Level of Independence: Independent with assistive device(s) Comments: Pt reports she was able to do what she wanted before just bent over walker as knee wouldnt hold her.  Pt is retired.  pt was driving etc per pt Communication Communication: No difficulties    Cognition  Cognition Arousal/Alertness: Awake/alert Behavior During Therapy: WFL for tasks assessed/performed Overall Cognitive Status: Within Functional Limits for tasks assessed    Extremity/Trunk Assessment Cervical / Trunk Assessment Cervical / Trunk Assessment:  (Pt is used to flexing forward when walking and putting all weight on walekr to keep weight off her left leg)   Balance    End of Session PT - End of Session Equipment Utilized During Treatment: Gait belt Activity Tolerance: Patient limited by pain Patient left: in chair;with call bell/phone within reach Nurse Communication: Mobility status  GP     Loyal Buba 04/05/2013, 3:34 PM   Rande Lawman, PT

## 2013-04-05 NOTE — Progress Notes (Signed)
Physical Therapy Treatment Patient Details Name: Teresa Stanley MRN: [id number] DOB: 10/01/1945 Today's Date: 04/05/2013 Time: 1440-1500 PT Time Calculation (min): 20 min  PT Assessment / Plan / Recommendation  History of Present Illness Teresa Stanley, 68 y.o. female, has a history of pain and functional disability in the left knee due to arthritis and has failed non-surgical conservative treatments .  Onset of symptoms was gradual, starting 3 years ago with gradually worsening course since that time. The patient noted no past surgery on the left knee(s).  Patient currently rates pain in the left knee(s) at 8 out of 10 with activity..   This patient has had progressive valgus deformity measuring 21 degrees on standing radiographs.. She has no problems with her other knee.   PT Comments   Pt walked 15 feet with RW leaning heavily on walker.  Pt needed +2 to get up from the recliner.  Follow Up Recommendations  SNF;Home health PT (Depending on progress and if family able to handle her)     Does the patient have the potential to tolerate intense rehabilitation     Barriers to Discharge   Pt wanting to go home with family assist.  Pt not here today due to the icy conditions.  Pt lives iwht husband and grown children nearby.  Pt has ramped entrance to house.  If pt not abel to progress fast enough may need short term SNF.  Will follow up with family and see how pt progressing.    Equipment Recommendations  None recommended by PT    Recommendations for Other Services    Frequency 7X/week   Progress towards PT Goals Progress towards PT goals: Progressing toward goals  Plan Current plan remains appropriate    Precautions / Restrictions Precautions Precautions: Knee;Fall Required Braces or Orthoses: Knee Immobilizer - Left Knee Immobilizer - Left: On when out of bed or walking Restrictions LLE Weight Bearing: Weight bearing as tolerated   Pertinent Vitals/Pain Pt reports 8/10 pain at end of  therapy    Mobility  Bed Mobility Overal bed mobility: Needs Assistance Bed Mobility: Sit to Supine Sit to supine: Mod assist General bed mobility comments: Needed help to get her leg back into the bed.  Used bed to raise feet so pt could scoot up in bed Transfers Overall transfer level: Needs assistance Equipment used: Rolling walker (2 wheeled) Transfers: Sit to/from Stand General transfer comment: Pt needed +2 assist to get up to her feet from the recliner. Ambulation/Gait Ambulation/Gait assistance: Mod assist Ambulation Distance (Feet): 15 Feet Assistive device: Rolling walker (2 wheeled) Gait Pattern/deviations: Step-to pattern;Trunk flexed;Decreased weight shift to left;Shuffle General Gait Details: Pt leaned heavily on walker  and needed frequent cues to try to stand taller.  Pt had harder time backing up as she was leaning forwad    Exercises Total Joint Exercises Ankle Circles/Pumps: AROM;Both;10 reps Quad Sets: AROM;Both;10 reps Gluteal Sets: AROM;Both;10 reps Heel Slides: AAROM;Left;10 reps Long Arc Quad: AAROM;Left;5 reps Goniometric ROM: pt with 0 to 50 degrees knee PROM   PT Diagnosis: Difficulty walking;Acute pain  PT Problem List: Decreased strength;Decreased range of motion;Decreased activity tolerance;Decreased mobility;Decreased knowledge of use of DME;Decreased knowledge of precautions;Obesity;Pain PT Treatment Interventions: DME instruction;Gait training;Therapeutic activities;Therapeutic exercise;Patient/family education   PT Goals (current goals can now be found in the care plan section) Acute Rehab PT Goals Patient Stated Goal: To recover at home iwth family help.  get back to walking without hurting PT Goal Formulation: With patient Time For Goal  Achievement: 04/19/13 Potential to Achieve Goals: Good  Visit Information  Last PT Received On: 04/05/13 Assistance Needed: +2 History of Present Illness: Teresa Stanley, 68 y.o. female, has a history of  pain and functional disability in the left knee due to arthritis and has failed non-surgical conservative treatments .  Onset of symptoms was gradual, starting 3 years ago with gradually worsening course since that time. The patient noted no past surgery on the left knee(s).  Patient currently rates pain in the left knee(s) at 8 out of 10 with activity..   This patient has had progressive valgus deformity measuring 21 degrees on standing radiographs.. She has no problems with her other knee.    Subjective Data  Patient Stated Goal: To recover at home iwth family help.  get back to walking without hurting   Cognition  Cognition Arousal/Alertness: Awake/alert Behavior During Therapy: WFL for tasks assessed/performed Overall Cognitive Status: Within Functional Limits for tasks assessed    Balance     End of Session PT - End of Session Equipment Utilized During Treatment: Gait belt;Left knee immobilizer Activity Tolerance: Patient limited by pain Patient left: in bed;with call bell/phone within reach Nurse Communication: Mobility status (Pt wanted to rest before getiing into CPM as leg hurting - nursing aware)   GP     Loyal Buba 04/05/2013, 3:46 PM   Rande Lawman, PT

## 2013-04-05 NOTE — Progress Notes (Signed)
Subjective: 1 Day Post-Op Procedure(s) (LRB): COMPUTER ASSISTED TOTAL KNEE ARTHROPLASTY (Left) Patient reports pain as mild.    Objective: Vital signs in last 24 hours: Temp:  [97.5 F (36.4 C)-98.5 F (36.9 C)] 98.3 F (36.8 C) (02/24 0516) Pulse Rate:  [43-95] 87 (02/24 0516) Resp:  [11-23] 16 (02/24 0516) BP: (140-195)/(64-110) 154/73 mmHg (02/24 0516) SpO2:  [95 %-100 %] 98 % (02/24 0516)  Intake/Output from previous day: 02/23 0701 - 02/24 0700 In: 2340 [P.O.:240; I.V.:2100] Out: 100 [Blood:100] Intake/Output this shift:     Recent Labs  04/05/13 0412  HGB 10.4*    Recent Labs  04/05/13 0412  WBC 11.0*  RBC 3.45*  HCT 31.3*  PLT 216    Recent Labs  04/05/13 0412  NA 138  K 5.3  CL 99  CO2 27  BUN 26*  CREATININE 0.97  GLUCOSE 218*  CALCIUM 8.6   No results found for this basename: LABPT, INR,  in the last 72 hours  Neurologically intact  Assessment/Plan: 1 Day Post-Op Procedure(s) (LRB): COMPUTER ASSISTED TOTAL KNEE ARTHROPLASTY (Left) Up with therapy. Used CPM,  OOB to recliner today and ambulate with PT. Her other knee is good.   Teresa Stanley C 04/05/2013, 7:46 AM

## 2013-04-06 LAB — CBC
HEMATOCRIT: 29.9 % — AB (ref 36.0–46.0)
Hemoglobin: 9.6 g/dL — ABNORMAL LOW (ref 12.0–15.0)
MCH: 29.3 pg (ref 26.0–34.0)
MCHC: 32.1 g/dL (ref 30.0–36.0)
MCV: 91.2 fL (ref 78.0–100.0)
Platelets: 235 10*3/uL (ref 150–400)
RBC: 3.28 MIL/uL — AB (ref 3.87–5.11)
RDW: 15.6 % — ABNORMAL HIGH (ref 11.5–15.5)
WBC: 13.3 10*3/uL — ABNORMAL HIGH (ref 4.0–10.5)

## 2013-04-06 LAB — GLUCOSE, CAPILLARY
Glucose-Capillary: 174 mg/dL — ABNORMAL HIGH (ref 70–99)
Glucose-Capillary: 175 mg/dL — ABNORMAL HIGH (ref 70–99)
Glucose-Capillary: 177 mg/dL — ABNORMAL HIGH (ref 70–99)
Glucose-Capillary: 163 mg/dL — ABNORMAL HIGH (ref 70–99)

## 2013-04-06 NOTE — Progress Notes (Signed)
Clinical Social Work Department BRIEF PSYCHOSOCIAL ASSESSMENT 04/06/2013  Patient:  Teresa Stanley, Teresa Stanley     Account Number:  [id number]     Admit date:  04/04/2013  Clinical Social Worker:  Adair Laundry  Date/Time:  04/06/2013 12:00 N  Referred by:  Physician  Date Referred:  04/06/2013 Referred for  SNF Placement   Other Referral:   Interview type:  Patient Other interview type:    PSYCHOSOCIAL DATA Living Status:  HUSBAND Admitted from facility:   Level of care:   Primary support name:  Kashlynn Kundert [phone] Primary support relationship to patient:  SPOUSE Degree of support available:   Pt has very supportive family    CURRENT CONCERNS Current Concerns  Post-Acute Placement   Other Concerns:    SOCIAL WORK ASSESSMENT / PLAN CSW aware of PT recommendation. CSW discussed recommendation with pt. Pt informed CSW she is really hoping to get to home. CSW understanding and explained that PT is saying is pt progresses with therapy then home is still an option. CSW explained that [facility] is just being looked at as [facility] backup right now. Pt was understanding of this and is agreeable to being faxed out to [facility]. However, pt continued to express hesitation towards this. CSW encouraged pt to do her best with therapy to show them what she is capable of. Pt feels as though she can manage at home with her husband. She stated that she also has children that can come help if needed. CSW said that this was great and CSW would inform RN CM but also start SNF search just incase so pt would have options. Pt was agreeable.   Assessment/plan status:  Psychosocial Support/Ongoing Assessment of Needs Other assessment/ plan:   Information/referral to community resources:   SNF list to be provided with bed offers if needed    PATIENT'S/FAMILY'S RESPONSE TO PLAN OF CARE: Pt is wanting to dc home but is willing to consider ST rehab if medical team thinks it will be best.        [place], [place]

## 2013-04-06 NOTE — Progress Notes (Signed)
Physical Therapy Treatment Patient Details Name: Teresa Stanley MRN: [id number] DOB: 1945-12-05 Today's Date: 04/06/2013 Time: 1425-1450 PT Time Calculation (min): 25 min  PT Assessment / Plan / Recommendation  History of Present Illness Pt wants to go directly home   PT Comments   Improving with continued PT and more practice with RW  Follow Up Recommendations  Home health PT     Does the patient have the potential to tolerate intense rehabilitation     Barriers to Discharge        Equipment Recommendations  Rolling walker with 5" wheels (wide RW)    Recommendations for Other Services    Frequency     Progress towards PT Goals Progress towards PT goals: Progressing toward goals  Plan Current plan remains appropriate (pt wants to go home despite encouragement to consider short term rehab)    Precautions / Restrictions Precautions Precautions: Knee;Fall Required Braces or Orthoses: Knee Immobilizer - Left Knee Immobilizer - Left: On when out of bed or walking Restrictions Weight Bearing Restrictions: Yes LLE Weight Bearing: Weight bearing as tolerated   Pertinent Vitals/Pain Did not rate leg pain    Mobility  Bed Mobility General bed mobility comments: pt did not want to return to bed Transfers Overall transfer level: Needs assistance Equipment used: Rolling walker (2 wheeled) (wide) Transfers: Sit to/from Stand Sit to Stand: +2 physical assistance General transfer comment: at final attempt pt was able to stand up from chair but pushing up with arms Ambulation/Gait Ambulation/Gait assistance: Min assist Ambulation Distance (Feet): 25 Feet (x2) Assistive device: Rolling walker (2 wheeled) (wide) Gait Pattern/deviations: Step-to pattern;Antalgic;Decreased step length - left;Trunk flexed Gait velocity: decreased General Gait Details: pt needs frequent cues to stand up tall and step into RW    Exercises Total Joint Exercises Ankle Circles/Pumps: AROM;Both;10  reps Quad Sets: AROM;Both;10 reps Gluteal Sets: AROM;Both;10 reps Heel Slides: AAROM;Left;10 reps Knee Flexion: AAROM;Left;5 reps;Seated Goniometric ROM: pt with c/o pain with attempts at knee ROM   PT Diagnosis:    PT Problem List:   PT Treatment Interventions:     PT Goals (current goals can now be found in the care plan section)    Visit Information  Last PT Received On: 04/06/13 Assistance Needed: +2 History of Present Illness: Pt wants to go directly home    Subjective Data      Cognition  Cognition Arousal/Alertness: Awake/alert Behavior During Therapy: WFL for tasks assessed/performed Overall Cognitive Status: Within Functional Limits for tasks assessed    Balance  Balance Overall balance assessment: Modified Independent  End of Session PT - End of Session Equipment Utilized During Treatment: Gait belt;Left knee immobilizer Activity Tolerance: Patient limited by pain Patient left: in chair Nurse Communication: Mobility status   GP Donato Heinz. [place], [place] 04/06/2013, 4:34 PM

## 2013-04-06 NOTE — Progress Notes (Addendum)
Clinical Social Work Department CLINICAL SOCIAL WORK PLACEMENT NOTE 04/06/2013  Patient:  Teresa Stanley, Teresa Stanley  Account Number:  [id number] Admit date:  04/04/2013  Clinical Social Worker:  Berton Mount, Latanya Presser  Date/time:  04/06/2013 02:00 PM  Clinical Social Work is seeking post-discharge placement for this patient at the following level of care:   SKILLED NURSING   (*CSW will update this form in Epic as items are completed)   04/06/2013  Patient/family provided with [place] Department of Clinical Social Work's list of facilities offering this level of care within the geographic area requested by the patient (or if unable, by the patient's family).  04/06/2013  Patient/family informed of their freedom to choose among providers that offer the needed level of care, that participate in Medicare, Medicaid or managed care program needed by the patient, have an available bed and are willing to accept the patient.  04/06/2013  Patient/family informed of [facility]' ownership interest in [facility], as well as of the fact that they are under no obligation to receive care at this facility.  PASARR submitted to EDS on 04/06/2013 PASARR number received from EDS on 04/06/2013  FL2 transmitted to all facilities in geographic area requested by pt/family on  04/06/2013 FL2 transmitted to all facilities within larger geographic area on   Patient informed that his/her managed care company has contracts with or will negotiate with  certain facilities, including the following:     Patient/family informed of bed offers received:  04/07/2013 Patient chooses bed at [facility] Physician recommends and patient chooses bed at    Patient to be transferred [facility]  on 04/08/2013  Patient to be transferred to facility by [facility]  The following physician request were entered in Epic:   Additional Comments:   [place], [place]

## 2013-04-06 NOTE — Progress Notes (Signed)
Physical Therapy Treatment Patient Details Name: Teresa Stanley MRN: [id number] DOB: [birth year] Today's Date: 04/06/2013 Time: 7619-5093 PT Time Calculation (min): 25 min  PT Assessment / Plan / Recommendation  History of Present Illness Pt continues to have pain in LLE   PT Comments   Pt appears to be struggling with mobility and has pain with any ROM attempts.  She  Follow Up Recommendations  Home health PT (discussed SNF, Pt adamantly wants to go home)     Does the patient have the potential to tolerate intense rehabilitation     Barriers to Discharge        Equipment Recommendations  Rolling walker with 5" wheels (wide RW)    Recommendations for Other Services    Frequency     Progress towards PT Goals    Plan Current plan remains appropriate    Precautions / Restrictions Precautions Precautions: Knee;Fall Required Braces or Orthoses: Knee Immobilizer - Left Knee Immobilizer - Left: On when out of bed or walking Restrictions Weight Bearing Restrictions: Yes LLE Weight Bearing: Weight bearing as tolerated   Pertinent Vitals/Pain Cries out in pain with attempts to exercise LLE. Pt did not rate pain    Mobility  Bed Mobility General bed mobility comments: Pt up in chair per nursing Transfers Overall transfer level: Needs assistance Equipment used: Rolling walker (2 wheeled) (wide) Transfers: Sit to/from Stand Sit to Stand: +2 physical assistance General transfer comment: pt needs assit for lift off from chair.  Pulled up on pad Ambulation/Gait Ambulation/Gait assistance: Mod assist Ambulation Distance (Feet): 15 Feet Assistive device: Rolling walker (2 wheeled) (wide) Gait Pattern/deviations: Step-to pattern;Antalgic;Decreased weight shift to left;Trunk flexed Gait velocity: decreased General Gait Details: Pt leans heavily forward onto walker, steps either too far forward into walker or pushed walker too far out in front of her    Exercises Total Joint  Exercises Ankle Circles/Pumps: AROM;Both;10 reps Quad Sets: AROM;Both;10 reps Gluteal Sets: AROM;Both;10 reps Heel Slides: AAROM;Left;10 reps Knee Flexion: AAROM;Left;5 reps;Seated Goniometric ROM: pt with c/o pain with attempts at knee ROM   PT Diagnosis:    PT Problem List:   PT Treatment Interventions:     PT Goals (current goals can now be found in the care plan section)    Visit Information  Last PT Received On: 04/06/13 Assistance Needed: +2 History of Present Illness: Pt continues to have pain in LLE    Subjective Data      Cognition  Cognition Arousal/Alertness: Awake/alert Behavior During Therapy: WFL for tasks assessed/performed Overall Cognitive Status: Within Functional Limits for tasks assessed    Balance  Balance Overall balance assessment: Modified Independent  End of Session PT - End of Session Equipment Utilized During Treatment: Gait belt;Left knee immobilizer Activity Tolerance: Patient limited by pain Patient left: in chair   GP    Helene Kelp K. [place], [place] 04/06/2013, 3:57 PM

## 2013-04-06 NOTE — Evaluation (Addendum)
Occupational Therapy Evaluation Patient Details Name: Teresa Stanley MRN: [id number] DOB: [birth year] Today's Date: 04/06/2013 Time: 9767-3419 OT Time Calculation (min): 24 min  OT Assessment / Plan / Recommendation History of present illness Teresa Stanley, 68 y.o. female, has a history of pain and functional disability in the left knee due to arthritis and has failed non-surgical conservative treatments .  Onset of symptoms was gradual, starting 3 years ago with gradually worsening course since that time. The patient noted no past surgery on the left knee(s).  Patient currently rates pain in the left knee(s) at 8 out of 10 with activity..   This patient has had progressive valgus deformity measuring 21 degrees on standing radiographs.. She has no problems with her other knee.   Clinical Impression   Pt demos decline in function with ADLs and ADL mobility safety with decreased strength and endurance. Pt would benefit from acute OT services to address impairments to increase level of function and safety. Pt requires extensive assist with ADLs and 2 person assist for mobility at this time and uncertain if family can handle    OT Assessment  Patient needs continued OT Services    Follow Up Recommendations  SNF;Supervision/Assistance - 24 hour;HH (family may not be able to handle current level of care)    Barriers to Discharge   may need short term SNF if family unable to handle her at home, may need more aggressive OT/PT than HH  Equipment Recommendations   3 in 1, A/E   Recommendations for Other Services    Frequency  Min 2X/week    Precautions / Restrictions Precautions Precautions: Knee;Fall Required Braces or Orthoses: Knee Immobilizer - Left Knee Immobilizer - Left: On when out of bed or walking Restrictions Weight Bearing Restrictions: Yes LLE Weight Bearing: Weight bearing as tolerated   Pertinent Vitals/Pain 6/10 L knee pain    ADL  Grooming: Performed;Wash/dry hands;Wash/dry  face;Supervision/safety;Set up Where Assessed - Grooming: Unsupported sitting Upper Body Bathing: Simulated;Supervision/safety;Set up Where Assessed - Upper Body Bathing: Unsupported sitting Lower Body Bathing: Simulated;Maximal assistance Upper Body Dressing: Performed;Supervision/safety;Set up Where Assessed - Upper Body Dressing: Unsupported sitting Lower Body Dressing: +1 Total assistance Transfers/Ambulation Related to ADLs: attempted sit - stand x 3 with RW, however pt unable to clear hips from bed, per PT notes pt requires 2 person assist ADL Comments: pt requires extensive assist with LB ADLs, would not be able to transfer to BSC/toilet without 2 person assist at this time. Pt provided with education and demo of ADL A/E for home use, educated on tub bench (shown picture)    OT Diagnosis: Generalized weakness;Acute pain  OT Problem List: Decreased strength;Pain;Decreased activity tolerance;Decreased knowledge of use of DME or AE;Impaired balance (sitting and/or standing) OT Treatment Interventions: Self-care/ADL training;Therapeutic activities;Therapeutic exercise;Neuromuscular education;Patient/family education;DME and/or AE instruction;Balance training   OT Goals(Current goals can be found in the care plan section) Acute Rehab OT Goals Patient Stated Goal: to go home OT Goal Formulation: With patient Time For Goal Achievement: 04/06/13 Potential to Achieve Goals: Good ADL Goals Pt Will Perform Lower Body Bathing: with mod assist;with adaptive equipment;sitting/lateral leans Pt Will Perform Lower Body Dressing: with max assist;with mod assist;sitting/lateral leans;sit to/from stand Pt Will Transfer to Toilet: with total assist;with max assist;bedside commode Pt Will Perform Toileting - Clothing Manipulation and hygiene: with max assist;with mod assist;sitting/lateral leans Additional ADL Goal #1: pt will complete bed mobility with min A to sit EOB in prep for ADLs  Visit  Information  Last OT Received On: 04/06/13 History of Present Illness: Teresa Stanley, 68 y.o. female, has a history of pain and functional disability in the left knee due to arthritis and has failed non-surgical conservative treatments .  Onset of symptoms was gradual, starting 3 years ago with gradually worsening course since that time. The patient noted no past surgery on the left knee(s).  Patient currently rates pain in the left knee(s) at 8 out of 10 with activity..   This patient has had progressive valgus deformity measuring 21 degrees on standing radiographs.. She has no problems with her other knee.       Prior [place] expects to be discharged to:: Private residence Living Arrangements: Spouse/significant other Available Help at Discharge: Available 24 hours/day;Family Type of Home: House Home Access: [place]: One [place]: [place] - single point Prior Function Level of Independence: Independent with assistive device(s) Comments: Pt reports she was able to do what she wanted before just bent over walker as knee wouldnt hold her.  Pt is retired.  pt was driving etc per pt Communication Communication: No difficulties Dominant Hand: Right         Vision/Perception Vision - History Baseline Vision: Wears glasses only for reading Patient Visual Report: No change from baseline Perception Perception: Within Functional Limits   Cognition  Cognition Arousal/Alertness: Awake/alert Behavior During Therapy: WFL for tasks assessed/performed Overall Cognitive Status: Within Functional Limits for tasks assessed    Extremity/Trunk Assessment Upper Extremity Assessment Upper Extremity Assessment: Overall WFL for tasks assessed;Generalized weakness Lower Extremity Assessment Lower Extremity Assessment: Defer to PT evaluation     Mobility Bed Mobility Overal bed mobility: Needs Assistance Bed Mobility: Sit to  Supine;Supine to Sit Supine to sit: Mod assist Sit to supine: Independent;Max assist General bed mobility comments: Needed help to get her leg back into the bed.  Used bed to raise feet so pt could scoot up in bed Transfers Overall transfer level: Needs assistance Equipment used: Rolling walker (2 wheeled) Transfers: Sit to/from Stand General transfer comment: attempted sit - stand x 3 with RW, however pt unable to clear hips from bed, per PT notes pt requires 2 person assist          Balance  Sitting balance Good   End of Session OT - End of Session Equipment Utilized During Treatment: Gait belt;Rolling walker Activity Tolerance: Patient limited by fatigue Patient left: in bed CPM Left Knee CPM Left Knee: Off  GO     Emmit [facility] 04/06/2013, 11:38 AM

## 2013-04-06 NOTE — Plan of Care (Signed)
Problem: Consults Goal: Diagnosis- Total Joint Replacement Outcome: Completed/Met Date Met:  04/06/13 Primary Total Knee     

## 2013-04-06 NOTE — Progress Notes (Signed)
Subjective: 2 Days Post-Op Procedure(s) (LRB): COMPUTER ASSISTED TOTAL KNEE ARTHROPLASTY (Left) Patient reports pain as moderate.   Slow progress.  Only has husband at home to help Pain well controlled at present Objective: Vital signs in last 24 hours: Temp:  [98.1 F (36.7 C)-99.1 F (37.3 C)] 99.1 F (37.3 C) (02/25 0649) Pulse Rate:  [88-110] 110 (02/25 0649) Resp:  [16-18] 18 (02/25 0649) BP: (112-154)/(61-77) 154/61 mmHg (02/25 0649) SpO2:  [92 %-97 %] 95 % (02/25 0649)  Intake/Output from previous day: 02/24 0701 - 02/25 0700 In: 600 [P.O.:600] Out: -  Intake/Output this shift: Total I/O In: 240 [P.O.:240] Out: -    Recent Labs  04/05/13 0412 04/06/13 0410  HGB 10.4* 9.6*    Recent Labs  04/05/13 0412 04/06/13 0410  WBC 11.0* 13.3*  RBC 3.45* 3.28*  HCT 31.3* 29.9*  PLT 216 235    Recent Labs  04/05/13 0412  NA 138  K 5.3  CL 99  CO2 27  BUN 26*  CREATININE 0.97  GLUCOSE 218*  CALCIUM 8.6   No results found for this basename: LABPT, INR,  in the last 72 hours  Neurovascular intact Sensation intact distally Intact pulses distally Dorsiflexion/Plantar flexion intact Incision: no drainage  Assessment/Plan: 2 Days Post-Op Procedure(s) (LRB): COMPUTER ASSISTED TOTAL KNEE ARTHROPLASTY (Left) Up with therapy Discharge home with home health if pt progresses well enough for family to care for at home. Continue to monitor progress.  Epimenio Foot 04/06/2013, 12:36 PM

## 2013-04-07 DIAGNOSIS — Z48812 Encounter for surgical aftercare following surgery on the circulatory system: Secondary | ICD-10-CM

## 2013-04-07 LAB — CBC
HCT: 28.3 % — ABNORMAL LOW (ref 36.0–46.0)
HEMOGLOBIN: 9.2 g/dL — AB (ref 12.0–15.0)
MCH: 29.5 pg (ref 26.0–34.0)
MCHC: 32.5 g/dL (ref 30.0–36.0)
MCV: 90.7 fL (ref 78.0–100.0)
Platelets: 213 10*3/uL (ref 150–400)
RBC: 3.12 MIL/uL — ABNORMAL LOW (ref 3.87–5.11)
RDW: 15.5 % (ref 11.5–15.5)
WBC: 11.3 10*3/uL — AB (ref 4.0–10.5)

## 2013-04-07 LAB — BASIC METABOLIC PANEL
BUN: 28 mg/dL — ABNORMAL HIGH (ref 6–23)
CALCIUM: 8.8 mg/dL (ref 8.4–10.5)
CO2: 26 meq/L (ref 19–32)
Chloride: 99 mEq/L (ref 96–112)
Creatinine, Ser: 1.09 mg/dL (ref 0.50–1.10)
GFR calc Af Amer: 60 mL/min — ABNORMAL LOW (ref >90)
GFR calc non Af Amer: 52 mL/min — ABNORMAL LOW (ref >90)
Glucose, Bld: 231 mg/dL — ABNORMAL HIGH (ref 70–99)
Potassium: 5.1 mEq/L (ref 3.7–5.3)
Sodium: 136 mEq/L — ABNORMAL LOW (ref 137–147)

## 2013-04-07 LAB — GLUCOSE, CAPILLARY
Glucose-Capillary: 196 mg/dL — ABNORMAL HIGH (ref 70–99)
Glucose-Capillary: 217 mg/dL — ABNORMAL HIGH (ref 70–99)
Glucose-Capillary: 268 mg/dL — ABNORMAL HIGH (ref 70–99)
Glucose-Capillary: 182 mg/dL — ABNORMAL HIGH (ref 70–99)

## 2013-04-07 MED ORDER — RIVAROXABAN 10 MG PO TABS
10.0000 mg | ORAL_TABLET | Freq: Every day | ORAL | Status: DC
  Filled 2013-04-07: qty 1

## 2013-04-07 MED ORDER — RIVAROXABAN 10 MG PO TABS
10.0000 mg | ORAL_TABLET | ORAL | Status: AC
  Administered 2013-04-07: 10 mg via ORAL
  Filled 2013-04-07: qty 1

## 2013-04-07 MED ORDER — POLYETHYLENE GLYCOL 3350 17 G PO PACK
17.0000 g | PACK | Freq: Every day | ORAL | Status: DC | PRN
  Administered 2013-04-07: 17 g via ORAL
  Filled 2013-04-07 (×2): qty 1

## 2013-04-07 NOTE — Progress Notes (Signed)
Subjective: 3 Days Post-Op Procedure(s) (LRB): COMPUTER ASSISTED TOTAL KNEE ARTHROPLASTY (Left) Patient reports pain as moderate.  States she is having lateral calf in her opposite leg( right leg)  Has been tachy to 100 to 120.   Objective: Vital signs in last 24 hours: Temp:  [98.7 F (37.1 C)-100.1 F (37.8 C)] 98.7 F (37.1 C) (02/26 0820) Pulse Rate:  [99-129] 129 (02/26 0820) Resp:  [18] 18 (02/26 0600) BP: (129-161)/(63-65) 145/64 mmHg (02/26 0820) SpO2:  [90 %-94 %] 91 % (02/26 0820)  Intake/Output from previous day: 02/25 0701 - 02/26 0700 In: 960 [P.O.:960] Out: -  Intake/Output this shift:     Recent Labs  04/05/13 0412 04/06/13 0410 04/07/13 0500  HGB 10.4* 9.6* 9.2*    Recent Labs  04/06/13 0410 04/07/13 0500  WBC 13.3* 11.3*  RBC 3.28* 3.12*  HCT 29.9* 28.3*  PLT 235 213    Recent Labs  04/05/13 0412  NA 138  K 5.3  CL 99  CO2 27  BUN 26*  CREATININE 0.97  GLUCOSE 218*  CALCIUM 8.6   No results found for this basename: LABPT, INR,  in the last 72 hours  Neurologically intact  Assessment/Plan: 3 Days Post-Op Procedure(s) (LRB): COMPUTER ASSISTED TOTAL KNEE ARTHROPLASTY (Left) Up with therapy   FL2 signed , she  Wants to go home but needs to be more mobile. Will check venous doppler , on SCD and ASA. Will low mobility she is demonstrating will use xaralto. And stop ASA Teresa Stanley C 04/07/2013, 9:44 AM

## 2013-04-07 NOTE — Progress Notes (Signed)
*  PRELIMINARY RESULTS* Vascular Ultrasound Lower extremity venous duplex has been completed.  Preliminary findings: No obvious evidence of DVT. Bilateral baker's cyst noted.   Landry Mellow, RDMS, RVT  04/07/2013, 12:30 PM

## 2013-04-07 NOTE — Progress Notes (Signed)
Dr. Lorin Mercy in to see pt. Rept to MD regarding pt heart rate 110-120's. Pt was running a fever this AM and when temperature rechecked-temperature was normal after pt performed IS. Pt also c/o increased pain of RLE today. Pt has + pedal pulses, + cap refill, +movement and sensation bilaterally. Pt pain "tolerable" on prescribed pain regimen. Pt repts pain worse today than yesterday of RLE. Pt denies chest pain, SOB or any other s/sx distress. Other than increased RLE pain, pt shows no s/sx of distress. Other than tachycardia, pt vital signs normal. Pt last potassium was 5.3 on 2/24. Also rept to Dr. Lorin Mercy. Per MD order, pt will be started on Xarelto, bilateral lower extremity dopplers to be obtained today and recheck BMET. Will also administer laxative per MD order today. Pt is already receiving stool softners per order. Will continue to monitor.

## 2013-04-07 NOTE — Care Management Note (Signed)
CARE MANAGEMENT NOTE 04/07/2013  Patient:  Teresa Stanley, Teresa Stanley   Account Number:  [id number]  Date Initiated:  04/07/2013  Documentation initiated by:  Ricki Miller  Subjective/Objective Assessment:   68 yr old female s/p left total knee arthroplasty.     Action/Plan:   Patient preoperatively setup with [facility], no changes. Per therapy patient needs SNF. Patient adamant about going home. CM will follow. Lives home with husband.   Anticipated DC Date:  04/08/2013   Anticipated DC Plan:    In-house referral  Clinical Social Worker      DC Planning Services  CM consult      [facility] Choice  [place]   Choice offered to / List presented to:          [facility] arranged  [place].   Status of service:  In process, will continue to follow

## 2013-04-07 NOTE — Progress Notes (Signed)
Physical Therapy Treatment Patient Details Name: Teresa Stanley MRN: [id number] DOB: 1945-05-02 Today's Date: 04/07/2013 Time: 2585-2778 PT Time Calculation (min): 29 min  PT Assessment / Plan / Recommendation  History of Present Illness S/p Lt TKA; R/o DVT of Rt LE today (2/26)    PT Comments   Pt continues to be greatly limited in mobility due to fatigue. Pt requires 2 person (A) for sit to stand transfers. Pt very unsteady with gt. Had lengthy conversation with pt recommending SNF, pt agreeable at this time, RN notified.   Follow Up Recommendations  SNF;Supervision/Assistance - 24 hour     Does the patient have the potential to tolerate intense rehabilitation     Barriers to Discharge        Equipment Recommendations  Rolling walker with 5" wheels    Recommendations for Other Services    Frequency 7X/week   Progress towards PT Goals Progress towards PT goals: Progressing toward goals  Plan Discharge plan needs to be updated    Precautions / Restrictions Precautions Precautions: Knee;Fall Required Braces or Orthoses: Knee Immobilizer - Left Knee Immobilizer - Left: On when out of bed or walking Restrictions Weight Bearing Restrictions: Yes LLE Weight Bearing: Weight bearing as tolerated   Pertinent Vitals/Pain 7/10; patient repositioned for comfort and pt premedicated by RN     Mobility  Bed Mobility Overal bed mobility: Needs Assistance Bed Mobility: Supine to Sit Supine to sit: Mod assist;HOB elevated General bed mobility comments: pt with incr difficulty achieving sitting position today; required (A) to advance LEs and bring trunk to sitting position; incr time due to pain  Transfers Overall transfer level: Needs assistance Equipment used: Rolling walker (2 wheeled) Transfers: Sit to/from Stand Sit to Stand: +2 physical assistance;Mod assist General transfer comment: pt requires 2 person (A) for sit to stand transfers from bed and from recliner; max cues for hand  placement and sequencing; pt leaning very anteriorly and unsteady  Ambulation/Gait Ambulation/Gait assistance: Min assist Ambulation Distance (Feet): 40 Feet (x2) Assistive device: Rolling walker (2 wheeled) Gait Pattern/deviations: Antalgic;Wide base of support;Trunk flexed;Decreased stance time - left;Decreased step length - right;Step-to pattern (anterior lean ) Gait velocity: decreased Gait velocity interpretation: <1.8 ft/sec, indicative of risk for recurrent falls General Gait Details: pt very unsteady; cues for sequencing and upright posture; fatigues quickly and requires sitting rest break in chair     Exercises Total Joint Exercises Ankle Circles/Pumps: AROM;Both;10 reps Heel Slides: AAROM;Left;10 reps Hip ABduction/ADduction: AAROM;Left;Strengthening;10 reps Long Arc Quad: AROM;10 reps;Seated;Both Goniometric ROM: limited to 30 degrees AAROM    PT Diagnosis:    PT Problem List:   PT Treatment Interventions:     PT Goals (current goals can now be found in the care plan section) Acute Rehab PT Goals Patient Stated Goal: home  PT Goal Formulation: With patient Time For Goal Achievement: 04/19/13 Potential to Achieve Goals: Good  Visit Information  Last PT Received On: 04/07/13 Assistance Needed: +2 History of Present Illness: Pt wants to go directly home    Subjective Data  Subjective: pt lying supine; "im very stiff and sore but i need to get up. i still want to go home" Patient Stated Goal: home    Cognition  Cognition Arousal/Alertness: Awake/alert Behavior During Therapy: WFL for tasks assessed/performed Overall Cognitive Status: Within Functional Limits for tasks assessed    Balance  Balance Overall balance assessment: Needs assistance Sitting-balance support: Feet supported;Bilateral upper extremity supported Sitting balance-Leahy Scale: Good Sitting balance - Comments: sat EOB ~  5 min to let dizziness subside prior to ambulating  Postural control: Other  (comment) (anterior lean ) Standing balance support: During functional activity;Bilateral upper extremity supported Standing balance-Leahy Scale: Poor Standing balance comment: unsteady; bil Ues supported General Comments General comments (skin integrity, edema, etc.): discussed PT recommendations of SNF due to balance deficits and incr (A) needed at this time; pt vebalized understanding   End of Session PT - End of Session Equipment Utilized During Treatment: Gait belt;Left knee immobilizer Activity Tolerance: Patient limited by pain Patient left: in chair;with call bell/phone within reach Nurse Communication: Mobility status;Precautions;Other (comment) (need for SNF)   GP     Gustavus Bryant, [place] 04/07/2013, 5:39 PM

## 2013-04-07 NOTE — Discharge Instructions (Signed)
Keep knee incision dry for 5 days post op then may wet while bathing. Therapy daily and  goal full extension and greater than 90 degrees flexion. Call if fever or chills or increased drainage. Go to ER if acutely short of breath or call for ambulance. Return for follow up in 2 weeks. May full weight bear on the surgical leg unless told otherwise. Use knee immobilizer until able to straight leg raise off bed with knee stable. In house walking for first 2 weeks. Ice packs as needed for pain and swelling     Information on my medicine - XARELTO (Rivaroxaban)  This medication education was reviewed with me or my healthcare representative as part of my discharge preparation.  The pharmacist that spoke with me during my hospital stay was:  Wayland Salinas, [facility]  Why was Xarelto prescribed for you? Xarelto was prescribed for you to reduce the risk of blood clots forming after orthopedic surgery. The medical term for these abnormal blood clots is venous thromboembolism (VTE).  What do you need to know about xarelto ? Take your Xarelto ONCE DAILY at the same time every day. You may take it either with or without food.  If you have difficulty swallowing the tablet whole, you may crush it and mix in applesauce just prior to taking your dose.  Take Xarelto exactly as prescribed by your doctor and DO NOT stop taking Xarelto without talking to the doctor who prescribed the medication.  Stopping without other VTE prevention medication to take the place of Xarelto may increase your risk of developing a clot.  After discharge, you should have regular check-up appointments with your healthcare provider that is prescribing your Xarelto.    What do you do if you miss a dose? If you miss a dose, take it as soon as you remember on the same day then continue your regularly scheduled once daily regimen the next day. Do not take two doses of Xarelto on the same day.   Important Safety  Information A possible side effect of Xarelto is bleeding. You should call your healthcare provider right away if you experience any of the following:   Bleeding from an injury or your nose that does not stop.   Unusual colored urine (red or dark brown) or unusual colored stools (red or black).   Unusual bruising for unknown reasons.   A serious fall or if you hit your head (even if there is no bleeding).  Some medicines may interact with Xarelto and might increase your risk of bleeding while on Xarelto. To help avoid this, consult your healthcare provider or pharmacist prior to using any new prescription or non-prescription medications, including herbals, vitamins, non-steroidal anti-inflammatory drugs (NSAIDs) and supplements.  This website has more information on Xarelto: [email or web address]

## 2013-04-07 NOTE — Progress Notes (Signed)
Rept to Dr. Lorin Mercy the results of pt's BMET today. No new orders at this time. Will continue to monitor.

## 2013-04-07 NOTE — Progress Notes (Signed)
CSW Armed forces technical officer) spoke with pt about bed offers and ST Rehab. Pt is still unsure what the dc plan will be. Pt very much wants to return home. CSW tried to explain to pt concerns for pt safety and ability to move around at home but pt believes she will be fine with assistance from husband. May be helpful for MD to discuss ST rehab with pt as pt is likely going to want to go home otherwise. CSW discussed with RN CM and pt nurse.  [place], [place]

## 2013-04-07 NOTE — Progress Notes (Signed)
ANTICOAGULATION CONSULT NOTE - Initial Consult  Pharmacy Consult for [facility] Indication: VTE prophylaxis  No Known Allergies  Patient Measurements:   Heparin Dosing Weight:    Vital Signs: Temp: 98.7 F (37.1 C) (02/26 0820) Temp src: Oral (02/26 0820) BP: 145/64 mmHg (02/26 0820) Pulse Rate: 129 (02/26 0820)  Labs:  Recent Labs  04/05/13 0412 04/06/13 0410 04/07/13 0500  HGB 10.4* 9.6* 9.2*  HCT 31.3* 29.9* 28.3*  PLT 216 235 213  CREATININE 0.97  --   --     The CrCl is unknown because both a height and weight (above a minimum accepted value) are required for this calculation.   Medical History: Past Medical History  Diagnosis Date  . Hyperlipidemia     takes Atorvastatin daily  . Hypertension     takes Lisinopril,Amlodipine,and Metoprolol daily  . GERD (gastroesophageal reflux disease)     takes Omeprazole daily  . Headache(784.0)     occasionally  . Diabetes mellitus     takes Actos and Metformin daily and Victoza  . Arthritis   . Joint pain   }  Assessment: TKA 2/23  68 y/o F s/p L TKA. MD note states States she is having lateral calf in her opposite leg( right leg) Has been tachy to 99-126 last 24h. Post op anemia with Hgb 9.2. No SQ heparin or LMWH noted since surgery.  Cards: BP slightly elevated 145/64 (HR 99-126) on Norvasc10, Lipitor, lisinopril, metoprolol  DM: CBGs 163-196 (unable to bring in Victoza) now on SSI, metformin, and Actos.   Goal of Therapy:  Therapeutic oral anticoagulation Monitor platelets by anticoagulation protocol: Yes   Plan:  Check doppler to r/o R calf DVT.  Start Xarelto for low mobility 10mg  daily   Teresa Stanley, PharmD, [facility] Clinical Staff Pharmacist Pager [phone]  Teresa Stanley 04/07/2013,10:13 AM

## 2013-04-08 LAB — GLUCOSE, CAPILLARY
Glucose-Capillary: 161 mg/dL — ABNORMAL HIGH (ref 70–99)
Glucose-Capillary: 197 mg/dL — ABNORMAL HIGH (ref 70–99)
Glucose-Capillary: 219 mg/dL — ABNORMAL HIGH (ref 70–99)

## 2013-04-08 MED ORDER — ASPIRIN EC 325 MG PO TBEC: 325.0000 mg | DELAYED_RELEASE_TABLET | Freq: Every day | ORAL | Status: DC

## 2013-04-08 MED ORDER — RIVAROXABAN 10 MG PO TABS: 10.0000 mg | ORAL_TABLET | Freq: Every day | ORAL | Status: DC

## 2013-04-08 MED ORDER — DSS 100 MG PO CAPS: 100.0000 mg | ORAL_CAPSULE | Freq: Two times a day (BID) | ORAL | Status: DC

## 2013-04-08 NOTE — Care Management Note (Signed)
04/08/13 11:00am Ricki Miller, RN BSN Patient has agreed to go to SNF for shortterm rehab. Social worker is aware and has presented bed offers. Case Manager signing off.

## 2013-04-08 NOTE — Progress Notes (Signed)
Physical Therapy Treatment Patient Details Name: Teresa Stanley MRN: [id number] DOB: [birth year] Today's Date: 04/08/2013 Time: 5638-9373 PT Time Calculation (min): 23 min  PT Assessment / Plan / Recommendation  History of Present Illness Pt. s/p L TKA.   PT Comments   Pt with improved ambulation and stability needing decreased assistance.  Continues to need +2 for transfers.  Follow Up Recommendations  SNF;Supervision/Assistance - 24 hour     Does the patient have the potential to tolerate intense rehabilitation     Barriers to Discharge        Equipment Recommendations  Rolling walker with 5" wheels    Recommendations for Other Services    Frequency 7X/week   Progress towards PT Goals Progress towards PT goals: Progressing toward goals  Plan Current plan remains appropriate    Precautions / Restrictions Precautions Precautions: Knee;Fall Required Braces or Orthoses: Knee Immobilizer - Left Knee Immobilizer - Left: On when out of bed or walking Restrictions Weight Bearing Restrictions: Yes LLE Weight Bearing: Weight bearing as tolerated   Pertinent Vitals/Pain No c/o, VSS    Mobility  Bed Mobility General bed mobility comments: pt. sitting in recliner before and after session Transfers Overall transfer level: Needs assistance Equipment used: Rolling walker (2 wheeled) Transfers: Sit to/from Stand Sit to Stand: +2 physical assistance;Mod assist General transfer comment: improved transfer however needs increased assistance in transition of hand placement from recliner to RW Ambulation/Gait Ambulation/Gait assistance: Supervision Ambulation Distance (Feet): 40 Feet Assistive device: Rolling walker (2 wheeled) Gait Pattern/deviations: Step-to pattern;Antalgic Gait velocity: decreased Gait velocity interpretation: <1.8 ft/sec, indicative of risk for recurrent falls General Gait Details: improved stability and posture today    Exercises Total Joint Exercises Ankle  Circles/Pumps: AROM;Both;10 reps Quad Sets: AROM;10 reps;Left Short Arc Quad: AAROM;10 reps;Left Heel Slides: Left;10 reps;AAROM Goniometric ROM: approx 0-40 degrees   PT Diagnosis:    PT Problem List:   PT Treatment Interventions:     PT Goals (current goals can now be found in the care plan section) Acute Rehab PT Goals Patient Stated Goal: to get better and go home PT Goal Formulation: With patient Time For Goal Achievement: 04/19/13 Potential to Achieve Goals: Good  Visit Information  Last PT Received On: 04/08/13 Assistance Needed: +2 (for bed mobility/transfers) History of Present Illness: Pt. s/p L TKA.    Subjective Data  Subjective: I want to walk Patient Stated Goal: to get better and go home   Cognition  Cognition Arousal/Alertness: Awake/alert Behavior During Therapy: WFL for tasks assessed/performed Overall Cognitive Status: Within Functional Limits for tasks assessed    Balance  Balance Overall balance assessment: Needs assistance Sitting-balance support: Feet supported;No upper extremity supported Sitting balance-Leahy Scale: Good Standing balance support: Bilateral upper extremity supported;During functional activity Standing balance-Leahy Scale: Fair General Comments General comments (skin integrity, edema, etc.): improved mobility, continues to need assistance with transfers  End of Session PT - End of Session Equipment Utilized During Treatment: Gait belt;Left knee immobilizer Activity Tolerance: Patient limited by pain Patient left: in chair;with call bell/phone within reach Nurse Communication: Mobility status;Precautions;Other (comment) CPM Left Knee CPM Left Knee: Off   GP     Faustino Congress K 04/08/2013, 2:15 PM  Laureen Abrahams, PT, DPT [phone]

## 2013-04-08 NOTE — Progress Notes (Signed)
Subjective: 4 Days Post-Op Procedure(s) (LRB): COMPUTER ASSISTED TOTAL KNEE ARTHROPLASTY (Left) Patient reports pain as mild.    Objective: Vital signs in last 24 hours: Temp:  [98.4 F (36.9 C)-98.6 F (37 C)] 98.5 F (36.9 C) (02/27 0600) Pulse Rate:  [105-114] 111 (02/27 0600) Resp:  [18-20] 18 (02/27 0600) BP: (151-156)/(67-81) 156/81 mmHg (02/27 0600) SpO2:  [91 %-97 %] 95 % (02/27 0600)  Intake/Output from previous day: 02/26 0701 - 02/27 0700 In: 960 [P.O.:960] Out: -  Intake/Output this shift:     Recent Labs  04/06/13 0410 04/07/13 0500  HGB 9.6* 9.2*    Recent Labs  04/06/13 0410 04/07/13 0500  WBC 13.3* 11.3*  RBC 3.28* 3.12*  HCT 29.9* 28.3*  PLT 235 213    Recent Labs  04/07/13 1330  NA 136*  K 5.1  CL 99  CO2 26  BUN 28*  CREATININE 1.09  GLUCOSE 231*  CALCIUM 8.8   No results found for this basename: LABPT, INR,  in the last 72 hours  Neurologically intact  Assessment/Plan: 4 Days Post-Op Procedure(s) (LRB): COMPUTER ASSISTED TOTAL KNEE ARTHROPLASTY (Left) Up with therapy  WILL BE GOING TO SNF. WILL GET SUMMARY READY IN CASE SHE GOES TODAY OR MONDAY  Filemon Breton C 04/08/2013, 9:19 AM

## 2013-04-08 NOTE — Progress Notes (Addendum)
CSW (Clinical Education officer, museum) spoke with pt to confirm pt was agreeable to SNF now. Pt said this was the plan. CSW asked if pt had chosen facility. Pt said her daughter was supposed to be visiting facilities. CSW called and left message for pt daughter Kenney Houseman [phone]. CSW did inform pt that she is ready for dc and we will need a decision today.  ADDENDUM: CSW received called back from pt daughter. Pt daughter has appointment at 1pm and then will visit facilities after that. CSW did ask for daughter to call CSW with decision as soon as possible to make sure that facility of choice gets dc paperwork in a timely manner.   [place], [place]

## 2013-04-08 NOTE — Discharge Summary (Signed)
Physician Discharge Summary  Patient ID: JILLIANA Stanley MRN: [id number] DOB/AGE: [birth year] 68 y.o.  Admit date: 04/04/2013 Discharge date: 04/08/2013  Admission Diagnoses:  Osteoarthritis of left knee  Discharge Diagnoses:  Principal Problem:   Osteoarthritis of left knee   Past Medical History  Diagnosis Date  . Hyperlipidemia     takes Atorvastatin daily  . Hypertension     takes Lisinopril,Amlodipine,and Metoprolol daily  . GERD (gastroesophageal reflux disease)     takes Omeprazole daily  . Headache(784.0)     occasionally  . Diabetes mellitus     takes Actos and Metformin daily and Victoza  . Arthritis   . Joint pain     Surgeries: Procedure(s): COMPUTER ASSISTED TOTAL KNEE ARTHROPLASTY on 04/04/2013   Consultants (if any):  none   Discharged Condition: Improved  Hospital Course: Teresa Stanley is an 68 y.o. female who was admitted 04/04/2013 with a diagnosis of Osteoarthritis of left knee and went to the operating room on 04/04/2013 and underwent the above named procedures.    She was given perioperative antibiotics:      Anti-infectives   Start     Dose/Rate Route Frequency Ordered Stop   04/04/13 0600  ceFAZolin (ANCEF) 3 g in dextrose 5 % 50 mL IVPB     3 g 160 mL/hr over 30 Minutes Intravenous On call to O.R. 04/03/13 1350 04/04/13 1245    Due to swelling in legs doppler study was performed and negative for DVT. Xarelto started secondary to minimal activity level. Pt slow with progress and felt to require short term SNF.  Arrangements made for rehab at nursing home.  She was given sequential compression devices, early ambulation, and Xarelto for DVT prophylaxis.  She benefited maximally from the hospital stay and there were no complications.    Recent vital signs:  Filed Vitals:   04/08/13 0600  BP: 156/81  Pulse: 111  Temp: 98.5 F (36.9 C)  Resp: 18    Recent laboratory studies:  Lab Results  Component Value Date   HGB 9.2* 04/07/2013   HGB  9.6* 04/06/2013   HGB 10.4* 04/05/2013   Lab Results  Component Value Date   WBC 11.3* 04/07/2013   PLT 213 04/07/2013   Lab Results  Component Value Date   INR 1.01 03/24/2013   Lab Results  Component Value Date   NA 136* 04/07/2013   K 5.1 04/07/2013   CL 99 04/07/2013   CO2 26 04/07/2013   BUN 28* 04/07/2013   CREATININE 1.09 04/07/2013   GLUCOSE 231* 04/07/2013    Discharge Medications:     Medication List         acetaminophen 500 MG tablet  Commonly known as:  TYLENOL  Take 500 mg by mouth every 6 (six) hours as needed. For pain.     amLODipine 10 MG tablet  Commonly known as:  NORVASC  Take 10 mg by mouth daily.     aspirin EC 325 MG tablet  Take 1 tablet (325 mg total) by mouth daily.     atorvastatin 20 MG tablet  Commonly known as:  LIPITOR  Take 20 mg by mouth daily.     DSS 100 MG Caps  Take 100 mg by mouth 2 (two) times daily.     lisinopril 20 MG tablet  Commonly known as:  PRINIVIL,ZESTRIL  Take 1 tablet (20 mg total) by mouth 2 (two) times daily.     metFORMIN 1000 MG tablet  Commonly known as:  GLUCOPHAGE  Take 1,000 mg by mouth 2 (two) times daily with a meal.     methocarbamol 500 MG tablet  Commonly known as:  ROBAXIN  Take 1 tablet (500 mg total) by mouth every 6 (six) hours as needed for muscle spasms (spasm).     metoprolol 100 MG tablet  Commonly known as:  LOPRESSOR  TAKE 1 TABLET (100 MG TOTAL) BY MOUTH 2 (TWO) TIMES DAILY.     omeprazole 20 MG capsule  Commonly known as:  PRILOSEC  Take 20 mg by mouth daily.     oxyCODONE-acetaminophen 5-325 MG per tablet  Commonly known as:  ROXICET  Take 1-2 tablets by mouth every 4 (four) hours as needed.     pioglitazone 30 MG tablet  Commonly known as:  ACTOS  Take 1 tablet (30 mg total) by mouth daily.     rivaroxaban 10 MG Tabs tablet  Commonly known as:  XARELTO  Take 1 tablet (10 mg total) by mouth daily with supper.     VICTOZA 18 MG/3ML Sopn  Generic drug:  Liraglutide  Inject  1.8 mg into the skin every morning.        Diagnostic Studies: Dg Chest 2 View  03/24/2013   CLINICAL DATA:  Pre admit left total knee  EXAM: CHEST  2 VIEW  COMPARISON:  CTA chest dated 01/27/2008  FINDINGS: Lungs are clear. No pleural effusion or pneumothorax.  The heart is normal in size.  Degenerative changes of the visualized thoracolumbar spine.  IMPRESSION: No evidence of acute cardiopulmonary disease.   Electronically Signed   By: Julian Hy M.D.   On: 03/24/2013 12:36   Mm Digital Screening  03/21/2013   CLINICAL DATA:  Screening.  EXAM: DIGITAL SCREENING BILATERAL MAMMOGRAM WITH CAD  COMPARISON:  Previous exam(s).  ACR Breast Density Category b: There are scattered areas of fibroglandular density.  FINDINGS: There are no findings suspicious for malignancy. Images were processed with CAD.  IMPRESSION: No mammographic evidence of malignancy. A result letter of this screening mammogram will be mailed directly to the patient.  RECOMMENDATION: Screening mammogram in one year. (Code:[id number]-B-01Y)  BI-RADS CATEGORY  1: Negative.   Electronically Signed   By: Luberta Robertson M.D.   On: 03/21/2013 14:24    Disposition: 01-Home or Self Care  Discharge Orders   Future Orders Complete By Expires   Full weight bearing  As directed    Questions:     Laterality:  left   Extremity:  Lower    Keep knee incision dry for 5 days post op then may wet while bathing. Therapy daily and  goal full extension and greater than 90 degrees flexion. Call if fever or chills or increased drainage. Go to ER if acutely short of breath or call for ambulance. Return for follow up in 2 weeks. May full weight bear on the surgical leg  Use knee immobilizer until able to straight leg raise off bed with knee stable. In house walking for first 2 weeks. Ice packs as needed for pain and swelling  Follow-up Information   Follow up with YATES,MARK C, MD. Schedule an appointment as soon as possible for a visit in 2 weeks.    Specialty:  Orthopedic Surgery   Contact information:   [place] [address] [phone]        Signed: Epimenio Foot 04/08/2013, 9:46 AM

## 2013-04-08 NOTE — Progress Notes (Signed)
CSW (Clinical Social Worker) prepared pt dc packet and placed with shadow chart. CSW arranged non-emergent ambulance transport. Pt, pt family, pt nurse, and facility informed. CSW signing off.  Cortlin Marano, LCSWA [phone]  

## 2013-04-11 ENCOUNTER — Other Ambulatory Visit: Payer: Self-pay | Admitting: *Deleted

## 2013-04-11 MED ORDER — OXYCODONE-ACETAMINOPHEN 5-325 MG PO TABS: ORAL_TABLET | ORAL | Status: DC

## 2013-04-11 NOTE — Telephone Encounter (Signed)
[facility]  

## 2013-04-12 ENCOUNTER — Non-Acute Institutional Stay (SKILLED_NURSING_FACILITY): Payer: Medicare Other | Admitting: Nurse Practitioner

## 2013-04-12 DIAGNOSIS — K5900 Constipation, unspecified: Secondary | ICD-10-CM

## 2013-04-12 DIAGNOSIS — K219 Gastro-esophageal reflux disease without esophagitis: Secondary | ICD-10-CM

## 2013-04-12 DIAGNOSIS — M1710 Unilateral primary osteoarthritis, unspecified knee: Secondary | ICD-10-CM

## 2013-04-12 DIAGNOSIS — M1712 Unilateral primary osteoarthritis, left knee: Secondary | ICD-10-CM

## 2013-04-12 DIAGNOSIS — IMO0002 Reserved for concepts with insufficient information to code with codable children: Secondary | ICD-10-CM

## 2013-04-12 DIAGNOSIS — E119 Type 2 diabetes mellitus without complications: Secondary | ICD-10-CM

## 2013-04-12 DIAGNOSIS — I10 Essential (primary) hypertension: Secondary | ICD-10-CM

## 2013-04-12 DIAGNOSIS — R110 Nausea: Secondary | ICD-10-CM

## 2013-04-12 NOTE — Progress Notes (Signed)
Patient ID: Teresa Stanley, female   DOB: 1945-04-26, 68 y.o.   MRN: [id number]    Nursing Home Location:  [place] of Service: SNF (31)  PCP: Maximino Greenland, MD  No Known Allergies  Chief Complaint  Patient presents with  . Hospitalization Follow-up    HPI:  Teresa Stanley is an 68 y.o. female who was admitted to the hospital on  04/04/2013 with a diagnosis of Osteoarthritis of left knee and underwent COMPUTER ASSISTED TOTAL KNEE ARTHROPLASTY. Due to swelling in legs doppler study was performed and negative for DVT. Xarelto started secondary to minimal activity level. Pt slow with progress and felt to require short term SNF and now she is at [facility] for rehab. Pt reports she has felt nauseous since since has been at [facility], no vomiting. Has not had regular BMs, no BM in 2 days.  Feels like pain is well controlled with current medication and is tolerating therapy.   Review of Systems:  Review of Systems  Constitutional: Negative for fever, chills and malaise/fatigue.  Respiratory: Negative for cough, sputum production and shortness of breath.   Cardiovascular: Negative for chest pain and palpitations.  Gastrointestinal: Positive for heartburn (controlled on current medications), nausea and constipation. Negative for vomiting, abdominal pain and diarrhea.  Genitourinary: Negative for dysuria.  Musculoskeletal: Positive for joint pain.  Skin: Negative.   Neurological: Negative for dizziness, sensory change, speech change, weakness and headaches.  Psychiatric/Behavioral: The patient does not have insomnia.      Past Medical History  Diagnosis Date  . Hyperlipidemia     takes Atorvastatin daily  . Hypertension     takes Lisinopril,Amlodipine,and Metoprolol daily  . GERD (gastroesophageal reflux disease)     takes Omeprazole daily  . Headache(784.0)     occasionally  . Diabetes mellitus     takes Actos and Metformin daily and Victoza  . Arthritis     . Joint pain    Past Surgical History  Procedure Laterality Date  . Abdominal hysterectomy    . Breast biopsy Left   . Cardiac catheterization  2009  . Cataract surgery Right   . Knee arthroplasty Left 04/04/2013    Procedure: COMPUTER ASSISTED TOTAL KNEE ARTHROPLASTY;  Surgeon: Marybelle Killings, MD;  Location: [place];  Service: Orthopedics;  Laterality: Left;  Left Total Knee Arthroplasty, Cemented   Social History:   reports that she has never smoked. She has never used smokeless tobacco. She reports that she does not drink alcohol or use illicit drugs.  Family History  Problem Relation Age of Onset  . Diabetes Mother   . Diabetes Father   . Asthma Father     Medications: Patient's Medications  New Prescriptions   No medications on file  Previous Medications   ACETAMINOPHEN (TYLENOL) 500 MG TABLET    Take 500 mg by mouth every 6 (six) hours as needed. For pain.   AMLODIPINE (NORVASC) 10 MG TABLET    Take 10 mg by mouth daily.   ASPIRIN EC 325 MG TABLET    Take 1 tablet (325 mg total) by mouth daily.   ATORVASTATIN (LIPITOR) 20 MG TABLET    Take 20 mg by mouth daily.   DOCUSATE SODIUM 100 MG CAPS    Take 100 mg by mouth 2 (two) times daily.   LIRAGLUTIDE (VICTOZA) 18 MG/3ML SOPN    Inject 1.8 mg into the skin every morning.   LISINOPRIL (PRINIVIL,ZESTRIL) 20 MG TABLET  Take 1 tablet (20 mg total) by mouth 2 (two) times daily.   METFORMIN (GLUCOPHAGE) 1000 MG TABLET    Take 1,000 mg by mouth 2 (two) times daily with a meal.   METHOCARBAMOL (ROBAXIN) 500 MG TABLET    Take 1 tablet (500 mg total) by mouth every 6 (six) hours as needed for muscle spasms (spasm).   METOPROLOL (LOPRESSOR) 100 MG TABLET    TAKE 1 TABLET (100 MG TOTAL) BY MOUTH 2 (TWO) TIMES DAILY.   OMEPRAZOLE (PRILOSEC) 20 MG CAPSULE    Take 20 mg by mouth daily.   OXYCODONE-ACETAMINOPHEN (ROXICET) 5-325 MG PER TABLET    Take one tablet by mouth every four hours as needed for pain; Take two tablets by mouth every four  hours as needed   PIOGLITAZONE (ACTOS) 30 MG TABLET    Take 1 tablet (30 mg total) by mouth daily.   RIVAROXABAN (XARELTO) 10 MG TABS TABLET    Take 1 tablet (10 mg total) by mouth daily with supper.  Modified Medications   No medications on file  Discontinued Medications   No medications on file     Physical Exam:  Filed Vitals:   04/12/13 1310  BP: 149/88  Pulse: 90  Temp: 98.3 F (36.8 C)  Resp: 20    Physical Exam  Vitals reviewed. Constitutional: She is well-developed, well-nourished, and in no distress.  HENT:  Mouth/Throat: Oropharynx is clear and moist. No oropharyngeal exudate.  Eyes: Conjunctivae and EOM are normal. Pupils are equal, round, and reactive to light.  Neck: Normal range of motion. Neck supple. No thyromegaly present.  Cardiovascular: Normal rate, regular rhythm and normal heart sounds.   Pulmonary/Chest: Effort normal and breath sounds normal. No respiratory distress.  Abdominal: Soft. Bowel sounds are normal. She exhibits no distension. There is no tenderness.  Musculoskeletal: She exhibits edema. She exhibits no tenderness.  surgeries incision to left knee with steri strips   Lymphadenopathy:    She has no cervical adenopathy.  Neurological: She is alert.  Skin: Skin is warm and dry.  Psychiatric: Affect normal.     Labs reviewed: Basic Metabolic Panel:  Recent Labs  03/24/13 1025 04/05/13 0412 04/07/13 1330  NA 141 138 136*  K 4.9 5.3 5.1  CL 100 99 99  CO2 25 27 26   GLUCOSE 167* 218* 231*  BUN 25* 26* 28*  CREATININE 0.86 0.97 1.09  CALCIUM 10.1 8.6 8.8   Liver Function Tests:  Recent Labs  03/24/13 1025  AST 18  ALT 11  ALKPHOS 113  BILITOT 0.3  PROT 7.5  ALBUMIN 4.0   No results found for this basename: LIPASE, AMYLASE,  in the last 8760 hours No results found for this basename: AMMONIA,  in the last 8760 hours CBC:  Recent Labs  04/05/13 0412 04/06/13 0410 04/07/13 0500  WBC 11.0* 13.3* 11.3*  HGB 10.4* 9.6*  9.2*  HCT 31.3* 29.9* 28.3*  MCV 90.7 91.2 90.7  PLT 216 235 213   Cardiac Enzymes: No results found for this basename: CKTOTAL, CKMB, CKMBINDEX, TROPONINI,  in the last 8760 hours BNP: No components found with this basename: POCBNP,  CBG:  Recent Labs  04/08/13 0635 04/08/13 1115 04/08/13 1638  GLUCAP 161* 197* 219*    Assessment/Plan 1. Osteoarthritis of left knee S/p COMPUTER ASSISTED TOTAL KNEE ARTHROPLASTY on 04/04/2013, pain well controlled -to cont therapy  -on xarelto for prophylactic   2. Diabetes mellitus -conts metformin, actos, victoza  -cbgs q am  3.  Hypertension -conts on lopressor, lisinopril   4. Nausea alone -will start zofran 4 mg q 6 hours PRN  5. Unspecified constipation -will stop colace and start senna s BID   6. GERD -cont prilosec 20 mg, controlled on current medications

## 2013-04-18 ENCOUNTER — Encounter: Payer: Self-pay | Admitting: Internal Medicine

## 2013-04-18 ENCOUNTER — Non-Acute Institutional Stay (SKILLED_NURSING_FACILITY): Payer: Medicare Other | Admitting: Internal Medicine

## 2013-04-18 DIAGNOSIS — Z96659 Presence of unspecified artificial knee joint: Secondary | ICD-10-CM

## 2013-04-18 DIAGNOSIS — I10 Essential (primary) hypertension: Secondary | ICD-10-CM

## 2013-04-18 DIAGNOSIS — E119 Type 2 diabetes mellitus without complications: Secondary | ICD-10-CM

## 2013-04-18 NOTE — Progress Notes (Signed)
MRN: [id number] Name: Teresa Stanley  Sex: female Age: 68 y.o. DOB: May 06, 1945  [place] #: [facility] Facility/Room: 212A Level Of Care: SNF Provider: Inocencio Homes D Emergency Contacts: Extended Emergency Contact Information Primary Emergency Contact: [place] of [place] Phone: [phone] Relation: Spouse Secondary Emergency Contact: Venora Maples [place] Phone: [phone] Mobile Phone: [phone] Relation: Son  Code Status: FULL  Allergies: Review of patient's allergies indicates no known allergies.  Chief Complaint  Patient presents with  . nursing home admission    HPI: Patient is 68 y.o. female who is admitted s/p L knee arthroplasty for OT/PT.  Past Medical History  Diagnosis Date  . Hyperlipidemia     takes Atorvastatin daily  . Hypertension     takes Lisinopril,Amlodipine,and Metoprolol daily  . GERD (gastroesophageal reflux disease)     takes Omeprazole daily  . Headache(784.0)     occasionally  . Diabetes mellitus     takes Actos and Metformin daily and Victoza  . Arthritis   . Joint pain     Past Surgical History  Procedure Laterality Date  . Abdominal hysterectomy    . Breast biopsy Left   . Cardiac catheterization  2009  . Cataract surgery Right   . Knee arthroplasty Left 04/04/2013    Procedure: COMPUTER ASSISTED TOTAL KNEE ARTHROPLASTY;  Surgeon: Marybelle Killings, MD;  Location: [facility];  Service: Orthopedics;  Laterality: Left;  Left Total Knee Arthroplasty, Cemented      Medication List       This list is accurate as of: 04/18/13  2:19 PM.  Always use your most recent med list.               acetaminophen 500 MG tablet  Commonly known as:  TYLENOL  Take 500 mg by mouth every 6 (six) hours as needed. For pain.     amLODipine 10 MG tablet  Commonly known as:  NORVASC  Take 10 mg by mouth daily.     aspirin EC 325 MG tablet  Take 1 tablet (325 mg total) by mouth daily.     atorvastatin  20 MG tablet  Commonly known as:  LIPITOR  Take 20 mg by mouth daily.     DSS 100 MG Caps  Take 100 mg by mouth 2 (two) times daily.     lisinopril 20 MG tablet  Commonly known as:  PRINIVIL,ZESTRIL  Take 1 tablet (20 mg total) by mouth 2 (two) times daily.     metFORMIN 1000 MG tablet  Commonly known as:  GLUCOPHAGE  Take 1,000 mg by mouth 2 (two) times daily with a meal.     methocarbamol 500 MG tablet  Commonly known as:  ROBAXIN  Take 1 tablet (500 mg total) by mouth every 6 (six) hours as needed for muscle spasms (spasm).     metoprolol 100 MG tablet  Commonly known as:  LOPRESSOR  TAKE 1 TABLET (100 MG TOTAL) BY MOUTH 2 (TWO) TIMES DAILY.     omeprazole 20 MG capsule  Commonly known as:  PRILOSEC  Take 20 mg by mouth daily.     oxyCODONE-acetaminophen 5-325 MG per tablet  Commonly known as:  ROXICET  Take one tablet by mouth every four hours as needed for pain; Take two tablets by mouth every four hours as needed     pioglitazone 30 MG tablet  Commonly known as:  ACTOS  Take 1 tablet (30 mg total) by mouth daily.  rivaroxaban 10 MG Tabs tablet  Commonly known as:  XARELTO  Take 1 tablet (10 mg total) by mouth daily with supper.     VICTOZA 18 MG/3ML Sopn  Generic drug:  Liraglutide  Inject 1.8 mg into the skin every morning.        No orders of the defined types were placed in this encounter.     There is no immunization history on file for this patient.  History  Substance Use Topics  . Smoking status: Never Smoker   . Smokeless tobacco: Never Used  . Alcohol Use: No    Family history is noncontributory    Review of Systems  DATA OBTAINED: from patient; pt has no c/o , she is progressing well GENERAL: Feels well no fevers, fatigue, appetite changes SKIN: No itching, rash or wounds EYES: No eye pain, redness, discharge EARS: No earache, tinnitus, change in hearing NOSE: No congestion, drainage or bleeding  MOUTH/THROAT: No mouth or tooth  pain, No sore throat, No difficulty chewing or swallowing  RESPIRATORY: No cough, wheezing, SOB CARDIAC: No chest pain, palpitations, lower extremity edema  GI: No abdominal pain, No N/V/D or constipation, No heartburn or reflux  GU: No dysuria, frequency or urgency, or incontinence  MUSCULOSKELETAL: No unrelieved bone/joint pain NEUROLOGIC: No headache, dizziness or focal weakness PSYCHIATRIC: No overt anxiety or sadness. Sleeps well. No behavior issue.   Filed Vitals:   04/18/13 1404  BP: 145/82  Pulse: 99  Temp: 97.9 F (36.6 C)  Resp: 19    Physical Exam  GENERAL APPEARANCE: Alert, conversant. Appropriately groomed. No acute distress., obese  SKIN: No diaphoresis rash, or wounds HEAD: Normocephalic, atraumatic  EYES: Conjunctiva/lids clear. Pupils round, reactive. EOMs intact.  EARS: External exam WNL, canals clear. Hearing grossly normal.  NOSE: No deformity or discharge.  MOUTH/THROAT: Lips w/o lesions.  RESPIRATORY: Breathing is even, unlabored. Lung sounds are clear   CARDIOVASCULAR: Heart RRR no murmurs, rubs or gallops. 1-2+ peripheral edema. LLE GASTROINTESTINAL: Abdomen is soft, non-tender, not distended w/ normal bowel sounds GENITOURINARY: Bladder non tender, not distended  MUSCULOSKELETAL: L knee arthroplasty NEUROLOGIC: Oriented X3. Cranial nerves 2-12 grossly intact. Moves all extremities no tremor. PSYCHIATRIC: Mood and affect appropriate to situation, no behavioral issues  Patient Active Problem List   Diagnosis Date Noted  . S/P total knee arthroplasty 04/18/2013  . Osteoarthritis of left knee 04/04/2013    Class: Diagnosis of  . [place] intertrigo 08/09/2012  . Hypertension 12/15/2011  . Diabetes mellitus 12/15/2011    CBC    Component Value Date/Time   WBC 11.3* 04/07/2013 0500   RBC 3.12* 04/07/2013 0500   HGB 9.2* 04/07/2013 0500   HCT 28.3* 04/07/2013 0500   PLT 213 04/07/2013 0500   MCV 90.7 04/07/2013 0500   LYMPHSABS 2.0 01/29/2010 2039    MONOABS 0.5 01/29/2010 2039   EOSABS 0.1 01/29/2010 2039   BASOSABS 0.0 01/29/2010 2039    CMP     Component Value Date/Time   NA 136* 04/07/2013 1330   K 5.1 04/07/2013 1330   CL 99 04/07/2013 1330   CO2 26 04/07/2013 1330   GLUCOSE 231* 04/07/2013 1330   BUN 28* 04/07/2013 1330   CREATININE 1.09 04/07/2013 1330   CALCIUM 8.8 04/07/2013 1330   PROT 7.5 03/24/2013 1025   ALBUMIN 4.0 03/24/2013 1025   AST 18 03/24/2013 1025   ALT 11 03/24/2013 1025   ALKPHOS 113 03/24/2013 1025   BILITOT 0.3 03/24/2013 1025   GFRNONAA 52* 04/07/2013  [place] 60* 04/07/2013 1330    Assessment and Plan  S/P total knee arthroplasty Had so much swelling of L leg post op that [place] was done but it was neg for DVT; pt on pain meds, robaxin and xarelto for prophylaxis; OT/PT  Diabetes mellitus Will continue outpt meds of actos, glucophage and victoza; pt is on ans ACE  Hypertension Continue norvasc, lopressor and zestril    Hennie Duos, MD

## 2013-04-18 NOTE — Assessment & Plan Note (Signed)
Continue norvasc, lopressor and zestril

## 2013-04-18 NOTE — Assessment & Plan Note (Signed)
Had so much swelling of L leg post op that [place] was done but it was neg for DVT; pt on pain meds, robaxin and xarelto for prophylaxis; OT/PT

## 2013-04-18 NOTE — Assessment & Plan Note (Addendum)
Will continue outpt meds of actos, glucophage and victoza; pt is on ans ACE

## 2013-04-28 ENCOUNTER — Other Ambulatory Visit: Payer: Self-pay | Admitting: *Deleted

## 2013-04-28 ENCOUNTER — Telehealth: Payer: Self-pay | Admitting: Gastroenterology

## 2013-04-28 MED ORDER — OXYCODONE-ACETAMINOPHEN 5-325 MG PO TABS: ORAL_TABLET | ORAL | Status: DC

## 2013-04-28 NOTE — Telephone Encounter (Signed)
[facility] 

## 2013-04-28 NOTE — Telephone Encounter (Signed)
I spoke with Teresa Stanley.  Patient has not been seen in GI.  She is trying to set up follow up with her primary care.  I have given her the phone number to [facility].  Primary care MD of record

## 2013-04-29 ENCOUNTER — Non-Acute Institutional Stay (SKILLED_NURSING_FACILITY): Payer: Medicare Other | Admitting: Nurse Practitioner

## 2013-04-29 DIAGNOSIS — E119 Type 2 diabetes mellitus without complications: Secondary | ICD-10-CM

## 2013-04-29 DIAGNOSIS — Z96659 Presence of unspecified artificial knee joint: Secondary | ICD-10-CM

## 2013-04-29 DIAGNOSIS — M1710 Unilateral primary osteoarthritis, unspecified knee: Secondary | ICD-10-CM

## 2013-04-29 DIAGNOSIS — K5900 Constipation, unspecified: Secondary | ICD-10-CM

## 2013-04-29 DIAGNOSIS — K219 Gastro-esophageal reflux disease without esophagitis: Secondary | ICD-10-CM

## 2013-04-29 DIAGNOSIS — IMO0002 Reserved for concepts with insufficient information to code with codable children: Secondary | ICD-10-CM

## 2013-04-29 DIAGNOSIS — I10 Essential (primary) hypertension: Secondary | ICD-10-CM

## 2013-04-29 DIAGNOSIS — M1712 Unilateral primary osteoarthritis, left knee: Secondary | ICD-10-CM

## 2013-04-29 NOTE — Progress Notes (Signed)
Patient ID: Teresa Stanley, female   DOB: 1945/07/27, 68 y.o.   MRN: [id number]    Nursing Home Location:  [place] of Service: SNF (31)  PCP: Maximino Greenland, MD  No Known Allergies  Chief Complaint  Patient presents with  . Discharge Note    HPI:  Teresa Stanley is an 68 y.o. female who was admitted to the hospital on 04/04/2013 with a diagnosis of Osteoarthritis of left knee and underwent COMPUTER ASSISTED TOTAL KNEE ARTHROPLASTY. Due to swelling in legs doppler study was performed and negative for DVT. Pt currently on Xarelto due to minimal activity level; Patient currently doing well with therapy, now stable to discharge home with home health.   Review of Systems:  Review of Systems  Constitutional: Negative for fever, chills and malaise/fatigue.  Respiratory: Negative for cough, sputum production and shortness of breath.   Cardiovascular: Negative for chest pain and palpitations.  Gastrointestinal: Positive for heartburn (controlled on current medications). Negative for nausea, vomiting, abdominal pain, diarrhea and constipation.  Genitourinary: Negative for dysuria.  Musculoskeletal: Positive for joint pain (well controlled with min medication).  Skin: Negative.   Neurological: Negative for dizziness, sensory change, speech change, weakness and headaches.  Psychiatric/Behavioral: The patient does not have insomnia.      Past Medical History  Diagnosis Date  . Hyperlipidemia     takes Atorvastatin daily  . Hypertension     takes Lisinopril,Amlodipine,and Metoprolol daily  . GERD (gastroesophageal reflux disease)     takes Omeprazole daily  . Headache(784.0)     occasionally  . Diabetes mellitus     takes Actos and Metformin daily and Victoza  . Arthritis   . Joint pain    Past Surgical History  Procedure Laterality Date  . Abdominal hysterectomy    . Breast biopsy Left   . Cardiac catheterization  2009  . Cataract surgery Right   .  Knee arthroplasty Left 04/04/2013    Procedure: COMPUTER ASSISTED TOTAL KNEE ARTHROPLASTY;  Surgeon: Marybelle Killings, MD;  Location: [facility];  Service: Orthopedics;  Laterality: Left;  Left Total Knee Arthroplasty, Cemented   Social History:   reports that she has never smoked. She has never used smokeless tobacco. She reports that she does not drink alcohol or use illicit drugs.  Family History  Problem Relation Age of Onset  . Diabetes Mother   . Diabetes Father   . Asthma Father     Medications: Patient's Medications  New Prescriptions   No medications on file  Previous Medications   ACETAMINOPHEN (TYLENOL) 500 MG TABLET    Take 500 mg by mouth every 6 (six) hours as needed. For pain.   AMLODIPINE (NORVASC) 10 MG TABLET    Take 10 mg by mouth daily.   ASPIRIN EC 325 MG TABLET    Take 1 tablet (325 mg total) by mouth daily.   ATORVASTATIN (LIPITOR) 20 MG TABLET    Take 20 mg by mouth daily.   DOCUSATE SODIUM 100 MG CAPS    Take 100 mg by mouth 2 (two) times daily.   LIRAGLUTIDE (VICTOZA) 18 MG/3ML SOPN    Inject 1.8 mg into the skin every morning.   LISINOPRIL (PRINIVIL,ZESTRIL) 20 MG TABLET    Take 1 tablet (20 mg total) by mouth 2 (two) times daily.   METFORMIN (GLUCOPHAGE) 1000 MG TABLET    Take 1,000 mg by mouth 2 (two) times daily with a meal.   METHOCARBAMOL (ROBAXIN)  500 MG TABLET    Take 1 tablet (500 mg total) by mouth every 6 (six) hours as needed for muscle spasms (spasm).   METOPROLOL (LOPRESSOR) 100 MG TABLET    TAKE 1 TABLET (100 MG TOTAL) BY MOUTH 2 (TWO) TIMES DAILY.   OMEPRAZOLE (PRILOSEC) 20 MG CAPSULE    Take 20 mg by mouth daily.   OXYCODONE-ACETAMINOPHEN (ROXICET) 5-325 MG PER TABLET    Take one tablet by mouth every four hours as needed for pain; Take two tablets by mouth every four hours as needed   PIOGLITAZONE (ACTOS) 30 MG TABLET    Take 1 tablet (30 mg total) by mouth daily.   RIVAROXABAN (XARELTO) 10 MG TABS TABLET    Take 1 tablet (10 mg total) by mouth daily  with supper.  Modified Medications   No medications on file  Discontinued Medications   No medications on file     Physical Exam:  Filed Vitals:   04/29/13 2024  BP: 119/63  Pulse: 98  Temp: 98.2 F (36.8 C)  Resp: 20    Physical Exam  Vitals reviewed. Constitutional: She is well-developed, well-nourished, and in no distress.  HENT:  Mouth/Throat: Oropharynx is clear and moist. No oropharyngeal exudate.  Eyes: Conjunctivae and EOM are normal. Pupils are equal, round, and reactive to light.  Neck: Normal range of motion. Neck supple. No thyromegaly present.  Cardiovascular: Normal rate, regular rhythm and normal heart sounds.   Pulmonary/Chest: Effort normal and breath sounds normal. No respiratory distress.  Abdominal: Soft. Bowel sounds are normal. She exhibits no distension. There is no tenderness.  Musculoskeletal: She exhibits edema (has improved). She exhibits no tenderness.  surgeries incision to left knee with steri strips   Lymphadenopathy:    She has no cervical adenopathy.  Neurological: She is alert.  Skin: Skin is warm and dry.  Psychiatric: Affect normal.     Labs reviewed: Basic Metabolic Panel:  Recent Labs  03/24/13 1025 04/05/13 0412 04/07/13 1330  NA 141 138 136*  K 4.9 5.3 5.1  CL 100 99 99  CO2 25 27 26   GLUCOSE 167* 218* 231*  BUN 25* 26* 28*  CREATININE 0.86 0.97 1.09  CALCIUM 10.1 8.6 8.8   Liver Function Tests:  Recent Labs  03/24/13 1025  AST 18  ALT 11  ALKPHOS 113  BILITOT 0.3  PROT 7.5  ALBUMIN 4.0   No results found for this basename: LIPASE, AMYLASE,  in the last 8760 hours No results found for this basename: AMMONIA,  in the last 8760 hours CBC:  Recent Labs  04/05/13 0412 04/06/13 0410 04/07/13 0500  WBC 11.0* 13.3* 11.3*  HGB 10.4* 9.6* 9.2*  HCT 31.3* 29.9* 28.3*  MCV 90.7 91.2 90.7  PLT 216 235 213   Cardiac Enzymes: No results found for this basename: CKTOTAL, CKMB, CKMBINDEX, TROPONINI,  in the  last 8760 hours BNP: No components found with this basename: POCBNP,  CBG:  Recent Labs  04/08/13 0635 04/08/13 1115 04/08/13 1638  GLUCAP 161* 197* 219*   Assessment/Plan 1. S/P total knee arthroplasty pt is stable for discharge-will need PT/OT per home health. DME needed includes bariatric walker and tub bench. Rx written.  will need to follow up with PCP within 2 weeks.  -pain controlled  -conts on xarelto; to be further managed by ortho 2. Osteoarthritis of left knee -now s/p total knee  3. Diabetes mellitus - Rx given for metformin, actos and victoza- ongoing management by PCP  4.  Hypertension -was well controlled at [facility] on lopressor and lisinopril   5. Unspecified constipation -improved with senna s BID; to cont on discharge  6. GERD (gastroesophageal reflux disease) -Patient GERD is stable; continue current regimen.

## 2013-07-17 ENCOUNTER — Ambulatory Visit (INDEPENDENT_AMBULATORY_CARE_PROVIDER_SITE_OTHER): Payer: Medicare Other | Admitting: Family Medicine

## 2013-07-17 VITALS — BP 142/80 | HR 91 | Temp 97.6°F | Resp 20 | Ht 63.0 in | Wt 274.8 lb

## 2013-07-17 DIAGNOSIS — I776 Arteritis, unspecified: Secondary | ICD-10-CM

## 2013-07-17 DIAGNOSIS — R21 Rash and other nonspecific skin eruption: Secondary | ICD-10-CM

## 2013-07-17 DIAGNOSIS — E119 Type 2 diabetes mellitus without complications: Secondary | ICD-10-CM

## 2013-07-17 LAB — COMPLETE METABOLIC PANEL WITH GFR
ALK PHOS: 112 U/L (ref 39–117)
ALT: 11 U/L (ref 0–35)
AST: 15 U/L (ref 0–37)
Albumin: 4.2 g/dL (ref 3.5–5.2)
BILIRUBIN TOTAL: 0.5 mg/dL (ref 0.2–1.2)
BUN: 23 mg/dL (ref 6–23)
CO2: 28 mEq/L (ref 19–32)
CREATININE: 1 mg/dL (ref 0.50–1.10)
Calcium: 9.9 mg/dL (ref 8.4–10.5)
Chloride: 102 mEq/L (ref 96–112)
GFR, Est African American: 68 mL/min
GFR, Est Non African American: 59 mL/min — ABNORMAL LOW
Glucose, Bld: 89 mg/dL (ref 70–99)
POTASSIUM: 5.1 meq/L (ref 3.5–5.3)
Sodium: 138 mEq/L (ref 135–145)
Total Protein: 7.1 g/dL (ref 6.0–8.3)

## 2013-07-17 LAB — POCT CBC
Granulocyte percent: 77.4 %G (ref 37–80)
HCT, POC: 38.4 % (ref 37.7–47.9)
Hemoglobin: 11.5 g/dL — AB (ref 12.2–16.2)
Lymph, poc: 1.5 (ref 0.6–3.4)
MCH: 27.4 pg (ref 27–31.2)
MCHC: 29.9 g/dL — AB (ref 31.8–35.4)
MCV: 91.5 fL (ref 80–97)
MID (CBC): 0.4 (ref 0–0.9)
MPV: 9.4 fL (ref 0–99.8)
PLATELET COUNT, POC: 328 10*3/uL (ref 142–424)
POC Granulocyte: 6.5 (ref 2–6.9)
POC LYMPH PERCENT: 17.8 %L (ref 10–50)
POC MID %: 4.8 %M (ref 0–12)
RBC: 4.2 M/uL (ref 4.04–5.48)
RDW, POC: 16 %
WBC: 8.4 10*3/uL (ref 4.6–10.2)

## 2013-07-17 LAB — GLUCOSE, POCT (MANUAL RESULT ENTRY): POC Glucose: 101 mg/dl — AB (ref 70–99)

## 2013-07-17 LAB — POCT SEDIMENTATION RATE: POCT SED RATE: 40 mm/h — AB (ref 0–22)

## 2013-07-17 NOTE — Progress Notes (Addendum)
Subjective:    Patient ID: Teresa Stanley, female    DOB: 1946-01-13, 68 y.o.   MRN: [id number]  Rash Pertinent negatives include no fever.   Chief Complaint  Patient presents with  . Rash    red spots on right lower leg x 3 days.      This chart was scribed for Merri Ray, MD by Thea Alken, ED Scribe. This patient was seen in room 3 and the patient's care was started at 12:51 PM.  HPI Comments: Teresa Stanley is a 68 y.o. female who presents to the [facility] complaining of erythematous rash located  RLE and left arm x 3 days. Pt states rash initially started at ankle but has spread up the left just below the knee. Pt denies itching or burning to rash but describes the rash as being sore. Pt has tried cortisone cream 3 times a day for the past 2 days. Pt denies being outside and exposure to poison ivy. Pt denies similar rash in the past. Pt denies fever. Pt denies similar rash in the past. Pt has h/o of DM and reports sugars running 122 this morning. She denies skin issues with DM. No recent new medications - newest Victoza 9 months ago. followed by Dr. Baird [facility]' office.   No cough, no lung disease known, no recent illness, no dyspnea.   Patient Active Problem List   Diagnosis Date Noted  . S/P total knee arthroplasty 04/18/2013  . Osteoarthritis of left knee 04/04/2013    Class: Diagnosis of  . Candidal intertrigo 08/09/2012  . Hypertension 12/15/2011  . Diabetes mellitus 12/15/2011   Past Medical History  Diagnosis Date  . Hyperlipidemia     takes Atorvastatin daily  . Hypertension     takes Lisinopril,Amlodipine,and Metoprolol daily  . GERD (gastroesophageal reflux disease)     takes Omeprazole daily  . Headache(784.0)     occasionally  . Diabetes mellitus     takes Actos and Metformin daily and Victoza  . Arthritis   . Joint pain    Past Surgical History  Procedure Laterality Date  . Abdominal hysterectomy    . Breast biopsy Left   .  Cardiac catheterization  2009  . Cataract surgery Right   . Knee arthroplasty Left 04/04/2013    Procedure: COMPUTER ASSISTED TOTAL KNEE ARTHROPLASTY;  Surgeon: Marybelle Killings, MD;  Location: [facility];  Service: Orthopedics;  Laterality: Left;  Left Total Knee Arthroplasty, Cemented   No Known Allergies Prior to Admission medications   Medication Sig Start Date End Date Taking? Authorizing Provider  acetaminophen (TYLENOL) 500 MG tablet Take 500 mg by mouth every 6 (six) hours as needed. For pain.   Yes Historical Provider, MD  amLODipine (NORVASC) 10 MG tablet Take 10 mg by mouth daily.   Yes Historical Provider, MD  aspirin EC 325 MG tablet Take 1 tablet (325 mg total) by mouth daily. 04/08/13  Yes Epimenio Foot, PA-C  atorvastatin (LIPITOR) 20 MG tablet Take 20 mg by mouth daily.   Yes Historical Provider, MD  lisinopril (PRINIVIL,ZESTRIL) 20 MG tablet Take 1 tablet (20 mg total) by mouth 2 (two) times daily. 12/15/11  Yes Shawnee Knapp, MD  metFORMIN (GLUCOPHAGE) 1000 MG tablet Take 1,000 mg by mouth 2 (two) times daily with a meal.   Yes Historical Provider, MD  metoprolol (LOPRESSOR) 100 MG tablet TAKE 1 TABLET (100 MG TOTAL) BY MOUTH 2 (TWO) TIMES DAILY. 09/12/12  Yes Heather  M Marte, PA-C  omeprazole (PRILOSEC) 20 MG capsule Take 20 mg by mouth daily.   Yes Historical Provider, MD  pioglitazone (ACTOS) 30 MG tablet Take 1 tablet (30 mg total) by mouth daily. 05/02/12  Yes Theda Sers, PA-C   History   Social History  . Marital Status: Married    Spouse Name: N/A    Number of Children: N/A  . Years of Education: N/A   Occupational History  . Not on file.   Social History Main Topics  . Smoking status: Never Smoker   . Smokeless tobacco: Never Used  . Alcohol Use: No  . Drug Use: No  . Sexual Activity: Yes    Birth Control/ Protection: Surgical   Other Topics Concern  . Not on file   Social History Narrative  . No narrative on file   Review of Systems  Constitutional:  Negative for fever and chills.  Skin: Positive for rash.      Objective:   Physical Exam  Nursing note and vitals reviewed. Constitutional: She is oriented to person, place, and time. She appears well-developed and well-nourished. No distress.  HENT:  Head: Normocephalic and atraumatic.  Eyes: Conjunctivae and EOM are normal.  Neck: Normal range of motion. No tracheal deviation present.  Cardiovascular: Normal rate.   Pulmonary/Chest: Effort normal.  Musculoskeletal: Normal range of motion.  Neurological: She is alert and oriented to person, place, and time.  Skin: Skin is warm and dry. Rash noted.  2-2.5cm erythematous patch just distal to knee Few erythematous scattered papular areas on anterior lower leg 1cm erythematous patches non blanching on lateral right lower leg  Psychiatric: She has a normal mood and affect. Her behavior is normal.   Results for orders placed in visit on 07/17/13  POCT CBC      Result Value Ref Range   WBC 8.4  4.6 - 10.2 K/uL   Lymph, poc 1.5  0.6 - 3.4   POC LYMPH PERCENT 17.8  10 - 50 %L   MID (cbc) 0.4  0 - 0.9   POC MID % 4.8  0 - 12 %M   POC Granulocyte 6.5  2 - 6.9   Granulocyte percent 77.4  37 - 80 %G   RBC 4.20  4.04 - 5.48 M/uL   Hemoglobin 11.5 (*) 12.2 - 16.2 g/dL   HCT, POC 38.4  37.7 - 47.9 %   MCV 91.5  80 - 97 fL   MCH, POC 27.4  27 - 31.2 pg   MCHC 29.9 (*) 31.8 - 35.4 g/dL   RDW, POC 16.0     Platelet Count, POC 328  142 - 424 K/uL   MPV 9.4  0 - 99.8 fL  GLUCOSE, POCT (MANUAL RESULT ENTRY)      Result Value Ref Range   POC Glucose 101 (*) 70 - 99 mg/dl  POCT SEDIMENTATION RATE      Result Value Ref Range   POCT SED RATE 40 (*) 0 - 22 mm/hr        Assessment & Plan:  Teresa Stanley is a 68 y.o. female Vasculitis - Plan: POCT CBC, POCT glucose (manual entry), COMPLETE METABOLIC PANEL WITH GFR, POCT SEDIMENTATION RATE  Rash and nonspecific skin eruption - Plan: POCT CBC, POCT glucose (manual entry), COMPLETE METABOLIC  PANEL WITH GFR, POCT SEDIMENTATION RATE  DM2 (diabetes mellitus, type 2) - Plan: POCT CBC, POCT glucose (manual entry), COMPLETE METABOLIC PANEL WITH GFR, POCT SEDIMENTATION RATE  Rash,  RLL, for past few days - possible vasculitis, early.  No acute illness otherwise.  Somewhat elevated sed rate.  CMP pending. Sx care with nsaid/alleve, recheck here or PCP in next few days and to determine further follow up. rtc sooner if spreading, or new sx's.     No orders of the defined types were placed in this encounter.   Patient Instructions  We will check a few tests for the rash on your leg, but it could be due to a condition called vasculitis. I recommend you be evaluated by your primary care provider or here later this week, but sooner if rash spreading to other areas. Ok to apply the steroid cream to areas if itching.   You should receive a call or letter about your lab results within the next week to 10 days.   Return to the clinic or go to the nearest emergency room if any of your symptoms worsen or new symptoms occur.    I personally performed the services described in this documentation, which was scribed in my presence. The recorded information has been reviewed and considered, and addended by me as needed.

## 2013-07-17 NOTE — Patient Instructions (Addendum)
We will check a few tests for the rash on your leg, but it could be due to a condition called vasculitis. I recommend you be evaluated by your primary care provider or here later this week, but sooner if rash spreading to other areas. Ok to apply the steroid cream to areas if itching.   You should receive a call or letter about your lab results within the next week to 10 days.   Return to the clinic or go to the nearest emergency room if any of your symptoms worsen or new symptoms occur.

## 2013-09-06 ENCOUNTER — Other Ambulatory Visit: Payer: Self-pay | Admitting: Nurse Practitioner

## 2013-09-08 ENCOUNTER — Other Ambulatory Visit: Payer: Self-pay | Admitting: Nurse Practitioner

## 2013-09-10 ENCOUNTER — Other Ambulatory Visit: Payer: Self-pay | Admitting: Nurse Practitioner

## 2014-01-22 ENCOUNTER — Encounter (HOSPITAL_COMMUNITY): Payer: Self-pay | Admitting: Emergency Medicine

## 2014-01-22 ENCOUNTER — Emergency Department (HOSPITAL_COMMUNITY)
Admission: EM | Admit: 2014-01-22 | Discharge: 2014-01-22 | Disposition: A | Discharge status: Nursing Home (Includes ICF) | Payer: Medicare Other | Source: Home / Self Care | Attending: Family Medicine | Admitting: Family Medicine

## 2014-01-22 ENCOUNTER — Encounter (HOSPITAL_COMMUNITY): Payer: Self-pay | Admitting: Family Medicine

## 2014-01-22 ENCOUNTER — Emergency Department (HOSPITAL_COMMUNITY)
Admission: EM | Admit: 2014-01-22 | Discharge: 2014-01-22 | Disposition: A | Discharge status: Home / Self Care | Payer: Medicare Other | Attending: Emergency Medicine | Admitting: Emergency Medicine

## 2014-01-22 ENCOUNTER — Emergency Department (HOSPITAL_COMMUNITY): Payer: Medicare Other

## 2014-01-22 DIAGNOSIS — Z79899 Other long term (current) drug therapy: Secondary | ICD-10-CM | POA: Diagnosis not present

## 2014-01-22 DIAGNOSIS — E119 Type 2 diabetes mellitus without complications: Secondary | ICD-10-CM | POA: Diagnosis not present

## 2014-01-22 DIAGNOSIS — I10 Essential (primary) hypertension: Secondary | ICD-10-CM | POA: Insufficient documentation

## 2014-01-22 DIAGNOSIS — R1032 Left lower quadrant pain: Secondary | ICD-10-CM

## 2014-01-22 DIAGNOSIS — Z7982 Long term (current) use of aspirin: Secondary | ICD-10-CM | POA: Insufficient documentation

## 2014-01-22 DIAGNOSIS — E785 Hyperlipidemia, unspecified: Secondary | ICD-10-CM | POA: Diagnosis not present

## 2014-01-22 DIAGNOSIS — K219 Gastro-esophageal reflux disease without esophagitis: Secondary | ICD-10-CM | POA: Diagnosis not present

## 2014-01-22 LAB — URINALYSIS, ROUTINE W REFLEX MICROSCOPIC
Bilirubin Urine: NEGATIVE
Glucose, UA: NEGATIVE mg/dL
Hgb urine dipstick: NEGATIVE
Ketones, ur: NEGATIVE mg/dL
Nitrite: NEGATIVE
Protein, ur: 30 mg/dL — AB
Specific Gravity, Urine: 1.022 (ref 1.005–1.030)
Urobilinogen, UA: 0.2 mg/dL (ref 0.0–1.0)
pH: 5 (ref 5.0–8.0)

## 2014-01-22 LAB — CBC WITH DIFFERENTIAL/PLATELET
Basophils Absolute: 0 10*3/uL (ref 0.0–0.1)
Basophils Relative: 0 % (ref 0–1)
Eosinophils Absolute: 0.1 10*3/uL (ref 0.0–0.7)
Eosinophils Relative: 1 % (ref 0–5)
HCT: 36.5 % (ref 36.0–46.0)
Hemoglobin: 11.4 g/dL — ABNORMAL LOW (ref 12.0–15.0)
Lymphocytes Relative: 24 % (ref 12–46)
Lymphs Abs: 1.6 10*3/uL (ref 0.7–4.0)
MCH: 27.9 pg (ref 26.0–34.0)
MCHC: 31.2 g/dL (ref 30.0–36.0)
MCV: 89.2 fL (ref 78.0–100.0)
Monocytes Absolute: 0.4 10*3/uL (ref 0.1–1.0)
Monocytes Relative: 6 % (ref 3–12)
Neutro Abs: 4.7 10*3/uL (ref 1.7–7.7)
Neutrophils Relative %: 69 % (ref 43–77)
Platelets: 294 10*3/uL (ref 150–400)
RBC: 4.09 MIL/uL (ref 3.87–5.11)
RDW: 14.8 % (ref 11.5–15.5)
WBC: 6.8 10*3/uL (ref 4.0–10.5)

## 2014-01-22 LAB — COMPREHENSIVE METABOLIC PANEL
ALT: 13 U/L (ref 0–35)
AST: 15 U/L (ref 0–37)
Albumin: 3.8 g/dL (ref 3.5–5.2)
Alkaline Phosphatase: 131 U/L — ABNORMAL HIGH (ref 39–117)
Anion gap: 13 (ref 5–15)
BUN: 17 mg/dL (ref 6–23)
CO2: 26 mEq/L (ref 19–32)
Calcium: 9.8 mg/dL (ref 8.4–10.5)
Chloride: 98 mEq/L (ref 96–112)
Creatinine, Ser: 0.78 mg/dL (ref 0.50–1.10)
GFR calc Af Amer: >90 mL/min (ref >90)
GFR calc non Af Amer: 85 mL/min — ABNORMAL LOW (ref >90)
Glucose, Bld: 110 mg/dL — ABNORMAL HIGH (ref 70–99)
Potassium: 4.6 mEq/L (ref 3.7–5.3)
Sodium: 137 mEq/L (ref 137–147)
Total Bilirubin: 0.4 mg/dL (ref 0.3–1.2)
Total Protein: 7.3 g/dL (ref 6.0–8.3)

## 2014-01-22 LAB — URINE MICROSCOPIC-ADD ON

## 2014-01-22 MED ORDER — IOHEXOL 300 MG/ML  SOLN
100.0000 mL | Freq: Once | INTRAMUSCULAR | Status: AC | PRN
  Administered 2014-01-22: 100 mL via INTRAVENOUS

## 2014-01-22 MED ORDER — ONDANSETRON 4 MG PO TBDP: 4.0000 mg | ORAL_TABLET | Freq: Three times a day (TID) | ORAL | Status: DC | PRN

## 2014-01-22 MED ORDER — IOHEXOL 300 MG/ML  SOLN
20.0000 mL | INTRAMUSCULAR | Status: AC
  Administered 2014-01-22: 25 mL via ORAL

## 2014-01-22 MED ORDER — MORPHINE SULFATE 4 MG/ML IJ SOLN
4.0000 mg | Freq: Once | INTRAMUSCULAR | Status: AC
  Administered 2014-01-22: 4 mg via INTRAVENOUS
  Filled 2014-01-22: qty 1

## 2014-01-22 MED ORDER — OXYCODONE-ACETAMINOPHEN 5-325 MG PO TABS: 1.0000 | ORAL_TABLET | ORAL | Status: DC | PRN

## 2014-01-22 NOTE — ED Notes (Signed)
Pt here for LLQ pain /flank pain. Denies any N,V,D. sts some slight constipation. denies any urinary complaints.

## 2014-01-22 NOTE — Discharge Instructions (Signed)
Your CT scan today was negative for surgical issues including diverticulitis. Take the prescribed medication as directed.  Percocet may cause constipation-- recommend stool softener such as colace, miralax, dulcolax, etc if needed to help prevent constipation. Follow-up with your primary care physician. Return to the ED for new or worsening symptoms-- severely worsening abdominal pain, uncontrollable nausea/vomiting, high fever, etc.

## 2014-01-22 NOTE — ED Notes (Signed)
Patient returned from CT

## 2014-01-22 NOTE — ED Provider Notes (Signed)
CSN: [id number]     Arrival date & time 01/22/14  1025 History   First MD Initiated Contact with Patient 01/22/14 1117     Chief Complaint  Patient presents with  . Abdominal Pain     (Consider location/radiation/quality/duration/timing/severity/associated sxs/prior Treatment) Patient is a 68 y.o. female presenting with abdominal pain. The history is provided by the patient and medical records.  Abdominal Pain   This is a 68 year old female with past medical history significant for hypertension, hyperlipidemia, GERD, headaches, diabetes, arthritis, presenting to the ED for abdominal pain. Patient states of the past 2 days she has had left lower quadrant abdominal pain, which has been progressively worsening. States pain is a cramping sensation, with occasional sharp stabbing pains. She denies any associated nausea, vomiting, or diarrhea. States her bowel movements have been somewhat smaller than usual, no melena or hematochezia.  No fever, chills, or sweats. No urinary symptoms. No recent travel or dietary changes. Prior abdominal surgeries include hysterectomy. She has had a colonoscopy which she states was several years ago by Dr. Deatra Ina-- reports it was normal.  BP elevated on arrival.  Past Medical History  Diagnosis Date  . Hyperlipidemia     takes Atorvastatin daily  . Hypertension     takes Lisinopril,Amlodipine,and Metoprolol daily  . GERD (gastroesophageal reflux disease)     takes Omeprazole daily  . Headache(784.0)     occasionally  . Diabetes mellitus     takes Actos and Metformin daily and Victoza  . Arthritis   . Joint pain    Past Surgical History  Procedure Laterality Date  . Abdominal hysterectomy    . Breast biopsy Left   . Cardiac catheterization  2009  . Cataract surgery Right   . Knee arthroplasty Left 04/04/2013    Procedure: COMPUTER ASSISTED TOTAL KNEE ARTHROPLASTY;  Surgeon: Marybelle Killings, MD;  Location: [facility];  Service: Orthopedics;  Laterality: Left;   Left Total Knee Arthroplasty, Cemented   Family History  Problem Relation Age of Onset  . Diabetes Mother   . Diabetes Father   . Asthma Father    History  Substance Use Topics  . Smoking status: Never Smoker   . Smokeless tobacco: Never Used  . Alcohol Use: No   OB History    No data available     Review of Systems  Gastrointestinal: Positive for abdominal pain.  All other systems reviewed and are negative.     Allergies  Review of patient's allergies indicates no known allergies.  Home Medications   Prior to Admission medications   Medication Sig Start Date End Date Taking? Authorizing Provider  acetaminophen (TYLENOL) 500 MG tablet Take 500 mg by mouth every 6 (six) hours as needed. For pain.    Historical Provider, MD  amLODipine (NORVASC) 10 MG tablet Take 10 mg by mouth daily.    Historical Provider, MD  aspirin EC 325 MG tablet Take 1 tablet (325 mg total) by mouth daily. 04/08/13   Epimenio Foot, PA-C  atorvastatin (LIPITOR) 20 MG tablet Take 20 mg by mouth daily.    Historical Provider, MD  lisinopril (PRINIVIL,ZESTRIL) 20 MG tablet Take 1 tablet (20 mg total) by mouth 2 (two) times daily. 12/15/11   Shawnee Knapp, MD  metFORMIN (GLUCOPHAGE) 1000 MG tablet Take 1,000 mg by mouth 2 (two) times daily with a meal.    Historical Provider, MD  metoprolol (LOPRESSOR) 100 MG tablet TAKE 1 TABLET (100 MG TOTAL) BY MOUTH 2 (TWO) TIMES  DAILY. 09/12/12   Heather Elnora Morrison, PA-C  omeprazole (PRILOSEC) 20 MG capsule Take 20 mg by mouth daily.    Historical Provider, MD  pioglitazone (ACTOS) 30 MG tablet Take 1 tablet (30 mg total) by mouth daily. 05/02/12   Eleanore E Egan, PA-C   BP 163/71 mmHg  Pulse 95  Temp(Src) 97.9 F (36.6 C)  Resp 18  SpO2 100%   Physical Exam  Constitutional: She is oriented to person, place, and time. She appears well-developed and well-nourished.  HENT:  Head: Normocephalic and atraumatic.  Mouth/Throat: Oropharynx is clear and moist.  Eyes:  Conjunctivae and EOM are normal. Pupils are equal, round, and reactive to light.  Neck: Normal range of motion.  Cardiovascular: Normal rate, regular rhythm and normal heart sounds.   Pulmonary/Chest: Effort normal and breath sounds normal. No respiratory distress. She has no wheezes.  Abdominal: Soft. Bowel sounds are normal. There is tenderness in the left lower quadrant. There is no guarding, no CVA tenderness, no tenderness at McBurney's point and negative Murphy's sign.  Abdomen soft, nondistended, mild tenderness in left lower quadrant and left lateral abdomen; no CVA tenderness  Musculoskeletal: Normal range of motion.  Neurological: She is alert and oriented to person, place, and time.  Skin: Skin is warm and dry.  Psychiatric: She has a normal mood and affect.  Nursing note and vitals reviewed.   ED Course  Procedures (including critical care time) Labs Review Labs Reviewed  URINALYSIS, ROUTINE W REFLEX MICROSCOPIC - Abnormal; Notable for the following:    APPearance CLOUDY (*)    Protein, ur 30 (*)    Leukocytes, UA TRACE (*)    All other components within normal limits  CBC WITH DIFFERENTIAL - Abnormal; Notable for the following:    Hemoglobin 11.4 (*)    All other components within normal limits  COMPREHENSIVE METABOLIC PANEL - Abnormal; Notable for the following:    Glucose, Bld 110 (*)    Alkaline Phosphatase 131 (*)    GFR calc non Af Amer 85 (*)    All other components within normal limits  URINE MICROSCOPIC-ADD ON - Abnormal; Notable for the following:    Squamous Epithelial / LPF MANY (*)    Bacteria, UA FEW (*)    All other components within normal limits    Imaging Review Ct Abdomen Pelvis W Contrast  01/22/2014   CLINICAL DATA:  Left-sided abdominal pain for 3 days. No nausea, vomiting.  EXAM: CT ABDOMEN AND PELVIS WITH CONTRAST  TECHNIQUE: Multidetector CT imaging of the abdomen and pelvis was performed using the standard protocol following bolus  administration of intravenous contrast.  CONTRAST:  165mL OMNIPAQUE IOHEXOL 300 MG/ML  SOLN  COMPARISON:  CT of the abdomen and pelvis 04/20/2009  FINDINGS: Lower chest: Lung bases are unremarkable in appearance. Heart is mildly enlarged.  Upper abdomen: No focal abnormality identified within the visualized portions of the liver, spleen, pancreas, or adrenal glands. Numerous bilateral renal cysts are present. No hydronephrosis. The courses of the ureters are unremarkable. Gallbladder is present.  Gastrointestinal tract: The stomach and small bowel loops are normal in appearance. The appendix is well seen and has a normal appearance. There are numerous descending and sigmoid colonic diverticula but no acute diverticulitis.  Pelvis: Uterus is absent.  No adnexal mass.  No free pelvic fluid.  Retroperitoneum: Within the left retroperitoneum, adjacent to the left ovarian vein, there is a small lobulated mass best seen on image 44 of series 201 and measuring  2.3 x 1.3 cm. This may represent small lymph lymph node. This appears discrete from the ureter and is probably stable since 2011. No other significant retroperitoneal adenopathy identified.  Abdominal wall: There are numerous small para umbilical and supraumbilical fat containing hernias. Anterior abdominal wall surgical change.  Osseous structures: Significant degenerative changes throughout the lower thoracic and lumbar spine. No suspicious lytic or blastic lesions are identified.  IMPRESSION: 1. Diverticulosis without acute diverticulitis. 2. Status post hysterectomy.  No adnexal mass. 3. Small retroperitoneal lymph node appears stable since prior studies and may represent a reactive lymph node from previous pelvic abscess. Given the stability, no further evaluation is felt to be necessary. 4. Numerous small fat containing paraumbilical and supraumbilical hernias. 5. Numerous bilateral renal cysts.   Electronically Signed   By: Shon Hale M.D.   On: 01/22/2014  13:50     EKG Interpretation None      MDM   Final diagnoses:  Abdominal pain, LLQ  Abdominal pain, LLQ   68 year old female with left lower quadrant abdominal pain. Seen and evaluated at urgent care and sent to the ED for further evaluation. On exam, patient afebrile and nontoxic in appearance. She does have some mild tenderness of her left lower quadrant and left lateral abdomen. No reported nausea or vomiting. Lab work pending at this time. Will obtain CT abdomen pelvis with contrast for further evaluation.  Labwork overall reassuring. UA appears contaminated. CT abdomen and pelvis revealing diverticulosis without diverticulitis.  After IV fluids and morphine, patient states she is feeling better. She has tolerated PO food and fluids without difficulty.  Patient will be discharged home.  Instructed to FU with PCP.  Rx percocet and zofran.  Instructed that Percocet may cause issues with constipation, may wish to take MiraLAX, colace, or other stool softener with this to prevent constipation.  Discussed plan with patient, he/she acknowledged understanding and agreed with plan of care.  Return precautions given for new or worsening symptoms.  Larene Pickett, PA-C 01/22/14 [place], MD 01/23/14 [phone]

## 2014-01-22 NOTE — ED Notes (Signed)
Pt states that her left side of her abdomen has been hurting since 01/21/2014 and pain has increased this morning.

## 2014-01-22 NOTE — ED Notes (Signed)
Pt will be transferred over to the [facility] ED via shuttle. Spoke with 1st nurse Ellouise Newer.

## 2014-01-22 NOTE — ED Notes (Signed)
Patient transported to CT 

## 2014-01-22 NOTE — ED Notes (Signed)
Pt finished drinking contrast. CT team notified

## 2014-01-22 NOTE — ED Provider Notes (Signed)
CSN: [id number]     Arrival date & time 01/22/14  0045 History   First MD Initiated Contact with Patient 01/22/14 [phone]     Chief Complaint  Patient presents with  . Abdominal Pain   (Consider location/radiation/quality/duration/timing/severity/associated sxs/prior Treatment) Patient is a 68 y.o. female presenting with abdominal pain. The history is provided by the patient.  Abdominal Pain Pain location:  LLQ Pain quality: cramping   Pain radiates to:  Does not radiate Pain severity:  Moderate Onset quality:  Gradual Duration:  2 days Progression:  Worsening Chronicity:  New Context comment:  Change in bowel activity Associated symptoms: constipation   Associated symptoms: no chills, no diarrhea, no fever, no hematemesis, no hematochezia, no melena, no nausea and no vomiting     Past Medical History  Diagnosis Date  . Hyperlipidemia     takes Atorvastatin daily  . Hypertension     takes Lisinopril,Amlodipine,and Metoprolol daily  . GERD (gastroesophageal reflux disease)     takes Omeprazole daily  . Headache(784.0)     occasionally  . Diabetes mellitus     takes Actos and Metformin daily and Victoza  . Arthritis   . Joint pain    Past Surgical History  Procedure Laterality Date  . Abdominal hysterectomy    . Breast biopsy Left   . Cardiac catheterization  2009  . Cataract surgery Right   . Knee arthroplasty Left 04/04/2013    Procedure: COMPUTER ASSISTED TOTAL KNEE ARTHROPLASTY;  Surgeon: Marybelle Killings, MD;  Location: [facility];  Service: Orthopedics;  Laterality: Left;  Left Total Knee Arthroplasty, Cemented   Family History  Problem Relation Age of Onset  . Diabetes Mother   . Diabetes Father   . Asthma Father    History  Substance Use Topics  . Smoking status: Never Smoker   . Smokeless tobacco: Never Used  . Alcohol Use: No   OB History    No data available     Review of Systems  Constitutional: Negative.  Negative for fever and chills.    Gastrointestinal: Positive for abdominal pain and constipation. Negative for nausea, vomiting, diarrhea, blood in stool, melena, hematochezia and hematemesis.  Genitourinary: Negative.     Allergies  Review of patient's allergies indicates no known allergies.  Home Medications   Prior to Admission medications   Medication Sig Start Date End Date Taking? Authorizing Provider  acetaminophen (TYLENOL) 500 MG tablet Take 500 mg by mouth every 6 (six) hours as needed. For pain.    Historical Provider, MD  amLODipine (NORVASC) 10 MG tablet Take 10 mg by mouth daily.    Historical Provider, MD  aspirin EC 325 MG tablet Take 1 tablet (325 mg total) by mouth daily. 04/08/13   Epimenio Foot, PA-C  atorvastatin (LIPITOR) 20 MG tablet Take 20 mg by mouth daily.    Historical Provider, MD  lisinopril (PRINIVIL,ZESTRIL) 20 MG tablet Take 1 tablet (20 mg total) by mouth 2 (two) times daily. 12/15/11   Shawnee Knapp, MD  metFORMIN (GLUCOPHAGE) 1000 MG tablet Take 1,000 mg by mouth 2 (two) times daily with a meal.    Historical Provider, MD  metoprolol (LOPRESSOR) 100 MG tablet TAKE 1 TABLET (100 MG TOTAL) BY MOUTH 2 (TWO) TIMES DAILY. 09/12/12   Heather Elnora Morrison, PA-C  omeprazole (PRILOSEC) 20 MG capsule Take 20 mg by mouth daily.    Historical Provider, MD  pioglitazone (ACTOS) 30 MG tablet Take 1 tablet (30 mg total) by mouth daily.  05/02/12   Eleanore E Egan, PA-C   BP 201/99 mmHg  Pulse 90  Temp(Src) 98.6 F (37 C) (Oral)  Resp 18  SpO2 97% Physical Exam  Constitutional: She is oriented to person, place, and time. She appears well-developed and well-nourished. No distress.  Abdominal: Soft. Normal appearance. She exhibits no distension and no mass. Bowel sounds are increased. There is no hepatosplenomegaly. There is tenderness in the left lower quadrant. There is no rigidity, no rebound, no guarding, no CVA tenderness, no tenderness at McBurney's point and negative Murphy's sign.    Neurological: She  is alert and oriented to person, place, and time.  Skin: Skin is warm and dry.  Nursing note and vitals reviewed.   ED Course  Procedures (including critical care time) Labs Review Labs Reviewed - No data to display  Imaging Review No results found.   MDM   1. Abdominal pain, acute, left lower quadrant    Sent for eval of llq pain--likely diverticulitis.    Billy Fischer, MD 01/22/14 1000

## 2014-02-14 ENCOUNTER — Other Ambulatory Visit: Payer: Self-pay

## 2014-02-14 DIAGNOSIS — Z1231 Encounter for screening mammogram for malignant neoplasm of breast: Secondary | ICD-10-CM

## 2014-03-07 ENCOUNTER — Encounter: Payer: Self-pay | Admitting: Gastroenterology

## 2014-03-22 ENCOUNTER — Encounter (INDEPENDENT_AMBULATORY_CARE_PROVIDER_SITE_OTHER): Payer: Self-pay

## 2014-03-22 ENCOUNTER — Ambulatory Visit
Admission: RE | Admit: 2014-03-22 | Discharge: 2014-03-22 | Disposition: A | Discharge status: Home / Self Care | Payer: Medicare Other | Source: Ambulatory Visit

## 2014-03-22 DIAGNOSIS — Z1231 Encounter for screening mammogram for malignant neoplasm of breast: Secondary | ICD-10-CM

## 2014-04-25 ENCOUNTER — Encounter: Payer: Self-pay | Admitting: Gastroenterology

## 2014-04-25 ENCOUNTER — Ambulatory Visit (INDEPENDENT_AMBULATORY_CARE_PROVIDER_SITE_OTHER): Payer: Medicare Other | Admitting: Gastroenterology

## 2014-04-25 VITALS — BP 176/100 | HR 76 | Ht 62.0 in | Wt 269.4 lb

## 2014-04-25 DIAGNOSIS — M545 Low back pain, unspecified: Secondary | ICD-10-CM

## 2014-04-25 DIAGNOSIS — K5730 Diverticulosis of large intestine without perforation or abscess without bleeding: Secondary | ICD-10-CM

## 2014-04-25 DIAGNOSIS — Z1211 Encounter for screening for malignant neoplasm of colon: Secondary | ICD-10-CM

## 2014-04-25 DIAGNOSIS — R109 Unspecified abdominal pain: Secondary | ICD-10-CM

## 2014-04-25 MED ORDER — PEG-KCL-NACL-NASULF-NA ASC-C 100 G PO SOLR: 1.0000 | Freq: Once | ORAL | Status: DC

## 2014-04-25 NOTE — Progress Notes (Signed)
History of Present Illness: This is a 69 year old patient who was referred by [name], MD for the evaluation of left flank pain, left back pain and diverticulosis. Occasionally her pain radiates to the left lower quadrant. She was evaluated in the emergency department in December 2015 and a CT scan result as shown below. She relates pain in her left hip, left flank and left back with standing and movement. The symptoms have no relation to any digestive function. Her bowel movements are generally normal consistency however she states occasionally she'll have a slightly loose stool. She states she had a colonoscopy performed somewhere in [place] over 10 years ago but she cannot recall where it was performed or who performed the procedure. We cannot locate any records of colonoscopy at this time. Denies weight loss, constipation, change in stool caliber, melena, hematochezia, nausea, vomiting, dysphagia, reflux symptoms, chest pain.  IMPRESSION: 1. Diverticulosis without acute diverticulitis. 2. Status post hysterectomy. No adnexal mass. 3. Small retroperitoneal lymph node appears stable since prior studies and may represent a reactive lymph node from previous pelvic abscess. Given the stability, no further evaluation is felt to be necessary. 4. Numerous small fat containing paraumbilical and supraumbilical hernias. 5. Numerous bilateral renal cysts.   No Known Allergies Outpatient Prescriptions Prior to Visit  Medication Sig Dispense Refill  . acetaminophen (TYLENOL) 500 MG tablet Take 500 mg by mouth every 6 (six) hours as needed. For pain.    [name] amLODipine (NORVASC) 10 MG tablet Take 10 mg by mouth daily.    [name] aspirin 81 MG tablet Take 81 mg by mouth daily.    [name] atorvastatin (LIPITOR) 20 MG tablet Take 20 mg by mouth daily.    . carboxymethylcellulose (REFRESH PLUS) 0.5 % SOLN Place 1 drop into both eyes daily.    [name] lisinopril (PRINIVIL,ZESTRIL) 20 MG tablet Take 1 tablet (20 mg  total) by mouth 2 (two) times daily. 60 tablet 1  . metFORMIN (GLUCOPHAGE) 1000 MG tablet Take 1,000 mg by mouth 2 (two) times daily with a meal.    . metoprolol (LOPRESSOR) 100 MG tablet TAKE 1 TABLET (100 MG TOTAL) BY MOUTH 2 (TWO) TIMES DAILY. 60 tablet 0  . Multiple Vitamins-Minerals (MULTIVITAMIN WITH MINERALS) tablet Take 1 tablet by mouth daily.    [name] omeprazole (PRILOSEC) 20 MG capsule Take 20 mg by mouth daily.    . ondansetron (ZOFRAN ODT) 4 MG disintegrating tablet Take 1 tablet (4 mg total) by mouth every 8 (eight) hours as needed for nausea. 10 tablet 0  . oxyCODONE-acetaminophen (PERCOCET/ROXICET) 5-325 MG per tablet Take 1 tablet by mouth every 4 (four) hours as needed. 15 tablet 0  . pioglitazone (ACTOS) 30 MG tablet Take 1 tablet (30 mg total) by mouth daily. 30 tablet 0  . aspirin EC 325 MG tablet Take 1 tablet (325 mg total) by mouth daily. (Patient not taking: Reported on 01/22/2014) 30 tablet 0   No facility-administered medications prior to visit.   Past Medical History  Diagnosis Date  . Hyperlipidemia     takes Atorvastatin daily  . Hypertension     takes Lisinopril,Amlodipine,and Metoprolol daily  . GERD (gastroesophageal reflux disease)     takes Omeprazole daily  . Headache(784.0)     occasionally  . Diabetes mellitus     takes Actos and Metformin daily and Victoza  . Arthritis   . Joint pain   . Thyroid disease     ?   Past Surgical History  Procedure  Laterality Date  . Abdominal hysterectomy    . Breast biopsy Left   . Cardiac catheterization  2009  . Cataract extraction Right   . Knee arthroplasty Left 04/04/2013    Procedure: COMPUTER ASSISTED TOTAL KNEE ARTHROPLASTY;  Surgeon: Marybelle Killings, MD;  Location: [facility];  Service: Orthopedics;  Laterality: Left;  Left Total Knee Arthroplasty, Cemented   History   Social History  . Marital Status: Married    Spouse Name: N/A  . Number of Children: 4  . Years of Education: N/A   Social History Main  Topics  . Smoking status: Never Smoker   . Smokeless tobacco: Never Used  . Alcohol Use: No  . Drug Use: No  . Sexual Activity: Yes    Birth Control/ Protection: Surgical   Other Topics Concern  . None   Social History Narrative   Family History  Problem Relation Age of Onset  . Diabetes Mother   . Diabetes Father   . Asthma Father   . Hypertension Daughter   . Hypertension Mother   . Hypertension Father   . Hypertension Sister     2  . Diabetes Sister   . Hypertension Brother     5  . Kidney disease Brother   . Diabetes Mother      Review of Systems: Pertinent positive and negative review of systems were noted in the above HPI section. All other review of systems were otherwise negative.   Physical Exam: General: Well developed, well nourished, obese, no acute distress Head: Normocephalic and atraumatic Eyes:  sclerae anicteric, EOMI Ears: Normal auditory acuity Mouth: No deformity or lesions Neck: Supple, no masses or thyromegaly Lungs: Clear throughout to auscultation Heart: Regular rate and rhythm; no murmurs, rubs or bruits Abdomen: Soft, non tender and non distended. No masses, hepatosplenomegaly or hernias noted. Normal Bowel sounds.  Back: Left lower back tenderness. Left flank/left hip tenderness Rectal: deferred to colonoscopy Musculoskeletal: Symmetrical with no gross deformities  Skin: No lesions on visible extremities Pulses:  Normal pulses noted Extremities: No clubbing, cyanosis, edema or deformities noted Neurological: Alert oriented x 4, grossly nonfocal Cervical Nodes:  No significant cervical adenopathy Inguinal Nodes: No significant inguinal adenopathy Psychological:  Alert and cooperative. Normal mood and affect  Assessment and Recommendations:  1. Left flank pain, left back pain. Occasional radiation to left lower quadrant. Symptoms have all typical features of a musculoskeletal etiology. I have advised her to return to Dr. Baird Cancer for  further evaluation of possible back, hip or other musculoskeletal disorder.  2. Diverticulosis. Long-term high fiber diet with adequate daily water intake.  3. Colorectal cancer screening, average risk. The risks (including bleeding, perforation, infection, missed lesions, medication reactions and possible hospitalization or surgery if complications occur), benefits, and alternatives to colonoscopy with possible biopsy and possible polypectomy were discussed with the patient and they consent to proceed.    cc: [name], MD [address] [place], [place] [address]

## 2014-04-25 NOTE — Patient Instructions (Signed)
You have been scheduled for a colonoscopy. Please follow written instructions given to you at your visit today.  Please pick up your prep supplies at the pharmacy within the next 1-3 days. If you use inhalers (even only as needed), please bring them with you on the day of your procedure. Your physician has requested that you go to www.startemmi.com and enter the access code given to you at your visit today. This web site gives a general overview about your procedure. However, you should still follow specific instructions given to you by our office regarding your preparation for the procedure.  Thank you for choosing me and [facility].  Pricilla Riffle. Dagoberto Ligas., MD., Marval Regal  cc: [name], MD

## 2014-04-26 ENCOUNTER — Telehealth: Payer: Self-pay | Admitting: Gastroenterology

## 2014-04-26 NOTE — Telephone Encounter (Signed)
Patient states Movi prep is 87 dollars and is too expensive. Informed patient that we can change her prep to a Miralax prep which is purchased OTC. Told patient I will send new instructions in the mail today. Told her to call if she does not receive them by Monday. Pt verbalized understanding.

## 2014-05-03 ENCOUNTER — Ambulatory Visit (AMBULATORY_SURGERY_CENTER): Payer: Medicare Other | Admitting: Gastroenterology

## 2014-05-03 ENCOUNTER — Encounter: Payer: Self-pay | Admitting: Gastroenterology

## 2014-05-03 VITALS — BP 154/80 | HR 70 | Temp 98.7°F | Resp 16 | Ht 62.0 in | Wt 269.0 lb

## 2014-05-03 DIAGNOSIS — K635 Polyp of colon: Secondary | ICD-10-CM | POA: Diagnosis not present

## 2014-05-03 DIAGNOSIS — Z1211 Encounter for screening for malignant neoplasm of colon: Secondary | ICD-10-CM

## 2014-05-03 DIAGNOSIS — D125 Benign neoplasm of sigmoid colon: Secondary | ICD-10-CM

## 2014-05-03 LAB — GLUCOSE, CAPILLARY
Glucose-Capillary: 180 mg/dL — ABNORMAL HIGH (ref 70–99)
Glucose-Capillary: 150 mg/dL — ABNORMAL HIGH (ref 70–99)

## 2014-05-03 MED ORDER — SODIUM CHLORIDE 0.9 % IV SOLN: 500.0000 mL | INTRAVENOUS | Status: DC

## 2014-05-03 NOTE — Patient Instructions (Addendum)
No Aspirin, Aspirin products or NSAIDS for two weeks, May 17, 2014.   YOU HAD AN ENDOSCOPIC PROCEDURE TODAY AT [facility]:   Refer to the procedure report that was given to you for any specific questions about what was found during the examination.  If the procedure report does not answer your questions, please call your gastroenterologist to clarify.  If you requested that your care partner not be given the details of your procedure findings, then the procedure report has been included in a sealed envelope for you to review at your convenience later.  YOU SHOULD EXPECT: Some feelings of bloating in the abdomen. Passage of more gas than usual.  Walking can help get rid of the air that was put into your GI tract during the procedure and reduce the bloating. If you had a lower endoscopy (such as a colonoscopy or flexible sigmoidoscopy) you may notice spotting of blood in your stool or on the toilet paper. If you underwent a bowel prep for your procedure, you may not have a normal bowel movement for a few days.  Please Note:  You might notice some irritation and congestion in your nose or some drainage.  This is from the oxygen used during your procedure.  There is no need for concern and it should clear up in a day or so.  SYMPTOMS TO REPORT IMMEDIATELY:   Following lower endoscopy (colonoscopy or flexible sigmoidoscopy):  Excessive amounts of blood in the stool  Significant tenderness or worsening of abdominal pains  Swelling of the abdomen that is new, acute  Fever of 100F or higher  For urgent or emergent issues, a gastroenterologist can be reached at any hour by calling [phone].   DIET: Your first meal following the procedure should be a small meal and then it is ok to progress to your normal diet. Heavy or fried foods are harder to digest and may make you feel nauseous or bloated.  Likewise, meals heavy in dairy and vegetables can increase bloating.  Drink plenty of  fluids but you should avoid alcoholic beverages for 24 hours.  ACTIVITY:  You should plan to take it easy for the rest of today and you should NOT DRIVE or use heavy machinery until tomorrow (because of the sedation medicines used during the test).    FOLLOW UP: Our staff will call the number listed on your records the next business day following your procedure to check on you and address any questions or concerns that you may have regarding the information given to you following your procedure. If we do not reach you, we will leave a message.  However, if you are feeling well and you are not experiencing any problems, there is no need to return our call.  We will assume that you have returned to your regular daily activities without incident.  If any biopsies were taken you will be contacted by phone or by letter within the next 1-3 weeks.  Please call us at [phone] if you have not heard about the biopsies in 3 weeks.    SIGNATURES/CONFIDENTIALITY: You and/or your care partner have signed paperwork which will be entered into your electronic medical record.  These signatures attest to the fact that that the information above on your After Visit Summary has been reviewed and is understood.  Full responsibility of the confidentiality of this discharge information lies with you and/or your care-partner.

## 2014-05-03 NOTE — Progress Notes (Signed)
Called to room to assist during endoscopic procedure.  Patient ID and intended procedure confirmed with present staff. Received instructions for my participation in the procedure from the performing physician.  

## 2014-05-03 NOTE — Progress Notes (Signed)
Report to PACU, RN, vss, BBS= Clear.  

## 2014-05-03 NOTE — Op Note (Addendum)
[facility]  [address] [place], [address]   COLONOSCOPY PROCEDURE REPORT  PATIENT: Teresa Stanley, Teresa Stanley  MR#: [id number] BIRTHDATE: 13-May-1945 , 5  yrs. old GENDER: female ENDOSCOPIST: Ladene Artist, MD, [facility] REFERRED JO:ACZYS Baird Cancer, M.D. PROCEDURE DATE:  05/03/2014 PROCEDURE:   Colonoscopy, screening and Colonoscopy with snare polypectomy First Screening Colonoscopy - Avg.  risk and is 50 yrs.  old or older - No.  Prior Negative Screening - Now for repeat screening. 10 or more years since last screening  History of Adenoma - Now for follow-up colonoscopy & has been > or = to 3 yrs.  N/A ASA CLASS:   Class III INDICATIONS:Screening for colonic neoplasia and Colorectal Neoplasm Risk Assessment for this procedure is average risk. MEDICATIONS: Monitored anesthesia care, Propofol 300 mg IV, and lidocaine 200 mg IV DESCRIPTION OF PROCEDURE:   After the risks benefits and alternatives of the procedure were thoroughly explained, informed consent was obtained.  The digital rectal exam revealed no abnormalities of the rectum.   The LB [id number]  endoscope was introduced through the anus and advanced to the cecum, which was identified by both the appendix and ileocecal valve. No adverse events experienced.   The quality of the prep was good.  (MoviPrep was used)  The instrument was then slowly withdrawn as the colon was fully examined.    COLON FINDINGS: There was moderate diverticulosis noted in the sigmoid colon and descending colon.   A pedunculated polyp measuring 8 mm in size was found in the sigmoid colon.  A polypectomy was performed using snare cautery.  The resection was complete but the polyp tissue was retrieved.   The examination was otherwise normal.  Retroflexed views revealed no abnormalities. The time to cecum = 2.0 Withdrawal time = 16.1   The scope was withdrawn and the procedure completed. COMPLICATIONS: There were no immediate  complications.  ENDOSCOPIC IMPRESSION: 1.   Moderate diverticulosis in the sigmoid colon and descending colon 2.   Pedunculated polyp in the sigmoid colon; polypectomy performed using snare cautery  RECOMMENDATIONS: 1.  Hold Aspirin and all other NSAIDS for 2 weeks. 2.  High fiber diet with liberal fluid intake. 3.  Await pathology 4.  Repeat Colonoscopy in 5 years if polyp adenomatous, otherwise 10 years.  eSigned:  Ladene Artist, MD, [facility] 05/03/2014 2:46 PM Revised: 05/03/2014 2:46 PM

## 2014-05-04 ENCOUNTER — Telehealth: Payer: Self-pay | Admitting: *Deleted

## 2014-05-04 NOTE — Telephone Encounter (Signed)
  Follow up Call-  Call back number 05/03/2014  Post procedure Call Back phone  # [phone]  Permission to leave phone message Yes     Patient questions:  Do you have a fever, pain , or abdominal swelling? No. Pain Score  0 *  Have you tolerated food without any problems? Yes.    Have you been able to return to your normal activities? Yes.    Do you have any questions about your discharge instructions: Diet   No. Medications  No. Follow up visit  No.  Do you have questions or concerns about your Care? No.  Actions: * If pain score is 4 or above: No action needed, pain <4.

## 2014-05-11 ENCOUNTER — Encounter: Payer: Self-pay | Admitting: Gastroenterology

## 2014-11-08 ENCOUNTER — Telehealth: Payer: Self-pay

## 2014-11-08 NOTE — Telephone Encounter (Signed)
Charting error.

## 2015-02-27 ENCOUNTER — Other Ambulatory Visit: Payer: Self-pay

## 2015-02-27 DIAGNOSIS — Z1231 Encounter for screening mammogram for malignant neoplasm of breast: Secondary | ICD-10-CM

## 2015-03-26 ENCOUNTER — Ambulatory Visit: Payer: Medicare Other

## 2015-03-29 ENCOUNTER — Ambulatory Visit
Admission: RE | Admit: 2015-03-29 | Discharge: 2015-03-29 | Disposition: A | Discharge status: Home / Self Care | Payer: Medicare Other | Source: Ambulatory Visit

## 2015-03-29 DIAGNOSIS — Z1231 Encounter for screening mammogram for malignant neoplasm of breast: Secondary | ICD-10-CM

## 2015-07-02 ENCOUNTER — Ambulatory Visit (INDEPENDENT_AMBULATORY_CARE_PROVIDER_SITE_OTHER): Payer: Medicare Other | Admitting: Physician Assistant

## 2015-07-02 VITALS — BP 153/79 | HR 86 | Temp 98.1°F | Resp 16 | Ht 62.0 in | Wt 279.0 lb

## 2015-07-02 DIAGNOSIS — N3000 Acute cystitis without hematuria: Secondary | ICD-10-CM

## 2015-07-02 DIAGNOSIS — K5900 Constipation, unspecified: Secondary | ICD-10-CM

## 2015-07-02 DIAGNOSIS — R109 Unspecified abdominal pain: Secondary | ICD-10-CM

## 2015-07-02 LAB — POCT CBC
GRANULOCYTE PERCENT: 67.3 % (ref 37–80)
HEMATOCRIT: 33.3 % — AB (ref 37.7–47.9)
Hemoglobin: 11 g/dL — AB (ref 12.2–16.2)
Lymph, poc: 1.8 (ref 0.6–3.4)
MCH, POC: 29.2 pg (ref 27–31.2)
MCHC: 33 g/dL (ref 31.8–35.4)
MCV: 88.4 fL (ref 80–97)
MID (CBC): 0.4 (ref 0–0.9)
MPV: 7.9 fL (ref 0–99.8)
PLATELET COUNT, POC: 242 10*3/uL (ref 142–424)
POC GRANULOCYTE: 4.6 (ref 2–6.9)
POC LYMPH %: 26.6 % (ref 10–50)
POC MID %: 6.1 %M (ref 0–12)
RBC: 3.77 M/uL — AB (ref 4.04–5.48)
RDW, POC: 15.4 %
WBC: 6.8 10*3/uL (ref 4.6–10.2)

## 2015-07-02 LAB — POCT URINALYSIS DIP (MANUAL ENTRY)
BILIRUBIN UA: NEGATIVE
Blood, UA: NEGATIVE
GLUCOSE UA: NEGATIVE
Nitrite, UA: NEGATIVE
Protein Ur, POC: 30 — AB
SPEC GRAV UA: 1.02
Urobilinogen, UA: 0.2
pH, UA: 5

## 2015-07-02 LAB — POC MICROSCOPIC URINALYSIS (UMFC): MUCUS RE: ABSENT

## 2015-07-02 MED ORDER — TRAMADOL HCL 50 MG PO TABS: 50.0000 mg | ORAL_TABLET | Freq: Three times a day (TID) | ORAL | Status: DC | PRN

## 2015-07-02 MED ORDER — NITROFURANTOIN MONOHYD MACRO 100 MG PO CAPS: 100.0000 mg | ORAL_CAPSULE | Freq: Two times a day (BID) | ORAL | Status: AC

## 2015-07-02 NOTE — Progress Notes (Signed)
[facility] [address], [place] [address] [phone]  Date:  07/02/2015   Name:  Teresa Stanley   DOB:  08-Jul-1945   MRN:  [id number]  PCP:  Maximino Greenland, MD  Chief Complaint  Patient presents with  . Flank Pain    left side/ x2 days     History of Present Illness:  Teresa Stanley is a 70 y.o. female patient who presents to [facility] for cc of left sided abdominal pain. This started 2 days ago, while she was sitting at church.  It was rated an 8/10 of pain.  She states it is intermittent.  She has a hx of constipation.  She generally takes miralax as needed but has not done any in regimen at this time.  She has bm daily, but not much over the last 3 days.  Hydration is minimum.  She has no dysuria, hematuria, or frequency.  No nausea.  Her bowel movements are without blood or melena.  She has no diarrhea.  No fever or dizziness.     Patient Active Problem List   Diagnosis Date Noted  . S/P total knee arthroplasty 04/18/2013  . Osteoarthritis of left knee 04/04/2013    Class: Diagnosis of  . Candidal intertrigo 08/09/2012  . Hypertension 12/15/2011  . Diabetes mellitus ([place]) 12/15/2011    Past Medical History  Diagnosis Date  . Hyperlipidemia     takes Atorvastatin daily  . Hypertension     takes Lisinopril,Amlodipine,and Metoprolol daily  . GERD (gastroesophageal reflux disease)     takes Omeprazole daily  . Headache(784.0)     occasionally  . Diabetes mellitus     takes Actos and Metformin daily and Victoza  . Arthritis   . Joint pain   . Thyroid disease     ?    Past Surgical History  Procedure Laterality Date  . Abdominal hysterectomy    . Breast biopsy Left   . Cardiac catheterization  2009  . Cataract extraction Right   . Knee arthroplasty Left 04/04/2013    Procedure: COMPUTER ASSISTED TOTAL KNEE ARTHROPLASTY;  Surgeon: Marybelle Killings, MD;  Location: [facility];  Service: Orthopedics;  Laterality: Left;  Left Total Knee Arthroplasty,  Cemented    Social History  Substance Use Topics  . Smoking status: Never Smoker   . Smokeless tobacco: Never Used  . Alcohol Use: No    Family History  Problem Relation Age of Onset  . Diabetes Mother   . Diabetes Father   . Asthma Father   . Hypertension Daughter   . Hypertension Mother   . Hypertension Father   . Hypertension Sister     2  . Diabetes Sister   . Hypertension Brother     5  . Kidney disease Brother   . Diabetes Mother     No Known Allergies  Medication list has been reviewed and updated.  Current Outpatient Prescriptions on File Prior to Visit  Medication Sig Dispense Refill  . acetaminophen (TYLENOL) 500 MG tablet Take 500 mg by mouth every 6 (six) hours as needed. For pain.    [name] amLODipine (NORVASC) 10 MG tablet Take 10 mg by mouth daily.    [name] aspirin 81 MG tablet Take 81 mg by mouth daily.    [name] atorvastatin (LIPITOR) 20 MG tablet Take 20 mg by mouth daily.    . carboxymethylcellulose (REFRESH PLUS) 0.5 % SOLN Place 1 drop into both eyes daily.    [name]  lisinopril (PRINIVIL,ZESTRIL) 20 MG tablet Take 1 tablet (20 mg total) by mouth 2 (two) times daily. 60 tablet 1  . metFORMIN (GLUCOPHAGE) 1000 MG tablet Take 1,000 mg by mouth 2 (two) times daily with a meal.    . metoprolol (LOPRESSOR) 100 MG tablet TAKE 1 TABLET (100 MG TOTAL) BY MOUTH 2 (TWO) TIMES DAILY. 60 tablet 0  . Multiple Vitamins-Minerals (MULTIVITAMIN WITH MINERALS) tablet Take 1 tablet by mouth daily.    [name] omeprazole (PRILOSEC) 20 MG capsule Take 20 mg by mouth daily.    . ondansetron (ZOFRAN ODT) 4 MG disintegrating tablet Take 1 tablet (4 mg total) by mouth every 8 (eight) hours as needed for nausea. (Patient not taking: Reported on 05/03/2014) 10 tablet 0  . oxyCODONE-acetaminophen (PERCOCET/ROXICET) 5-325 MG per tablet Take 1 tablet by mouth every 4 (four) hours as needed. (Patient not taking: Reported on 07/02/2015) 15 tablet 0  . pioglitazone (ACTOS) 30 MG tablet Take 1 tablet (30 mg  total) by mouth daily. 30 tablet 0   No current facility-administered medications on file prior to visit.    ROS ROS otherwise unremarkable unless listed above.   Physical Examination: BP 153/79 mmHg  Pulse 86  Temp(Src) 98.1 F (36.7 C) (Oral)  Resp 16  Ht 5\' 2"  (1.575 m)  Wt 279 lb (126.554 kg)  BMI 51.02 kg/m2  SpO2 98% Ideal Body Weight: Weight in (lb) to have BMI = 25: 136.4  Physical Exam  Constitutional: She is oriented to person, place, and time. She appears well-developed and well-nourished. No distress.  HENT:  Head: Normocephalic and atraumatic.  Right Ear: External ear normal.  Left Ear: External ear normal.  Eyes: Conjunctivae and EOM are normal. Pupils are equal, round, and reactive to light.  Cardiovascular: Normal rate.   Pulmonary/Chest: Effort normal. No respiratory distress.  Abdominal: Soft. Normal appearance and bowel sounds are normal. There is no hepatosplenomegaly. There is tenderness in the left lower quadrant. There is no CVA tenderness, no tenderness at McBurney's point and negative Murphy's sign.  Neurological: She is alert and oriented to person, place, and time.  Skin: She is not diaphoretic.  Psychiatric: She has a normal mood and affect. Her behavior is normal.     Results for orders placed or performed in visit on 07/02/15  Urine culture  Result Value Ref Range   Colony Count 75,000 COLONIES/ML    Organism ID, Bacteria Multiple bacterial morphotypes present, none    Organism ID, Bacteria predominant. Suggest appropriate recollection if     Organism ID, Bacteria clinically indicated.   COMPLETE METABOLIC PANEL WITH GFR  Result Value Ref Range   Sodium 137 135 - 146 mmol/L   Potassium 4.6 3.5 - 5.3 mmol/L   Chloride 103 98 - 110 mmol/L   CO2 23 20 - 31 mmol/L   Glucose, Bld 124 (H) 65 - 99 mg/dL   BUN 26 (H) 7 - 25 mg/dL   Creat 1.00 (H) 0.50 - 0.99 mg/dL   Total Bilirubin 0.4 0.2 - 1.2 mg/dL   Alkaline Phosphatase 112 33 - 130 U/L    AST 14 10 - 35 U/L   ALT 10 6 - 29 U/L   Total Protein 6.6 6.1 - 8.1 g/dL   Albumin 4.0 3.6 - 5.1 g/dL   Calcium 9.4 8.6 - 10.4 mg/dL   GFR, Est African American 67 >=60 mL/min   GFR, Est Non African American 58 (L) >=60 mL/min  POCT urinalysis dipstick  Result Value Ref  Range   Color, UA yellow yellow   Clarity, UA clear clear   Glucose, UA negative negative   Bilirubin, UA negative negative   Ketones, POC UA trace (5) (A) negative   Spec Grav, UA 1.020    Blood, UA negative negative   pH, UA 5.0    Protein Ur, POC =30 (A) negative   Urobilinogen, UA 0.2    Nitrite, UA Negative Negative   Leukocytes, UA large (3+) (A) Negative  POCT Microscopic Urinalysis ([facility])  Result Value Ref Range   WBC,UR,HPF,POC Many (A) None WBC/hpf   RBC,UR,HPF,POC None None RBC/hpf   Bacteria Many (A) None, Too numerous to count   Mucus Absent Absent   Epithelial Cells, UR Per Microscopy Many (A) None, Too numerous to count cells/hpf  POCT CBC  Result Value Ref Range   WBC 6.8 4.6 - 10.2 K/uL   Lymph, poc 1.8 0.6 - 3.4   POC LYMPH PERCENT 26.6 10 - 50 %L   MID (cbc) 0.4 0 - 0.9   POC MID % 6.1 0 - 12 %M   POC Granulocyte 4.6 2 - 6.9   Granulocyte percent 67.3 37 - 80 %G   RBC 3.77 (A) 4.04 - 5.48 M/uL   Hemoglobin 11.0 (A) 12.2 - 16.2 g/dL   HCT, POC 33.3 (A) 37.7 - 47.9 %   MCV 88.4 80 - 97 fL   MCH, POC 29.2 27 - 31.2 pg   MCHC 33.0 31.8 - 35.4 g/dL   RDW, POC 15.4 %   Platelet Count, POC 242 142 - 424 K/uL   MPV 7.9 0 - 99.8 fL     Assessment and Plan: Teresa Stanley is a 70 y.o. female who is here today  This appears to be a urinary tract infection. We are giving her Macrobid at this time. I have advised her of alarming symptoms to warrant an immediate return. I am also advising her to restart her MiraLAX at twice per day Acute cystitis without hematuria - Plan: nitrofurantoin, macrocrystal-monohydrate, (MACROBID) 100 MG capsule, traMADol (ULTRAM) 50 MG tablet  Flank pain - Plan:  POCT urinalysis dipstick, POCT Microscopic Urinalysis ([facility]), POCT CBC, COMPLETE METABOLIC PANEL WITH GFR, Urine culture  Ivar Drape, PA-C [facility] 07/02/2015 7:19 PM

## 2015-07-02 NOTE — Patient Instructions (Addendum)
IF you received an x-ray today, you will receive an invoice from [facility]. Please contact [facility] at [phone] with questions or concerns regarding your invoice.   IF you received labwork today, you will receive an invoice from [employer]. Please contact [facility] at [phone] with questions or concerns regarding your invoice.   Our billing staff will not be able to assist you with questions regarding bills from these companies.  You will be contacted with the lab results as soon as they are available. The fastest way to get your results is to activate your My Chart account. Instructions are located on the last page of this paperwork. If you have not heard from [place] regarding the results in 2 weeks, please contact this office.    Please hydrate well with water.   I would like you to do the miralax twice per day for no more than 7 days. Please take the antibiotic as prescribed.  We are treating your for a urinary tract infection.  i am placing a culture.  If this is normal, I will contact you.    Urinary Tract Infection Urinary tract infections (UTIs) can develop anywhere along your urinary tract. Your urinary tract is your body's drainage system for removing wastes and extra water. Your urinary tract includes two kidneys, two ureters, a bladder, and a urethra. Your kidneys are a pair of bean-shaped organs. Each kidney is about the size of your fist. They are located below your ribs, one on each side of your spine. CAUSES Infections are caused by microbes, which are microscopic organisms, including fungi, viruses, and bacteria. These organisms are so small that they can only be seen through a microscope. Bacteria are the microbes that most commonly cause UTIs. SYMPTOMS  Symptoms of UTIs may vary by age and gender of the patient and by the location of the infection. Symptoms in young women typically include a frequent and intense urge to  urinate and a painful, burning feeling in the bladder or urethra during urination. Older women and men are more likely to be tired, shaky, and weak and have muscle aches and abdominal pain. A fever may mean the infection is in your kidneys. Other symptoms of a kidney infection include pain in your back or sides below the ribs, nausea, and vomiting. DIAGNOSIS To diagnose a UTI, your caregiver will ask you about your symptoms. Your caregiver will also ask you to provide a urine sample. The urine sample will be tested for bacteria and white blood cells. White blood cells are made by your body to help fight infection. TREATMENT  Typically, UTIs can be treated with medication. Because most UTIs are caused by a bacterial infection, they usually can be treated with the use of antibiotics. The choice of antibiotic and length of treatment depend on your symptoms and the type of bacteria causing your infection. HOME CARE INSTRUCTIONS  If you were prescribed antibiotics, take them exactly as your caregiver instructs you. Finish the medication even if you feel better after you have only taken some of the medication.  Drink enough water and fluids to keep your urine clear or pale yellow.  Avoid caffeine, tea, and carbonated beverages. They tend to irritate your bladder.  Empty your bladder often. Avoid holding urine for long periods of time.  Empty your bladder before and after sexual intercourse.  After a bowel movement, women should cleanse from front to back. Use each tissue only once. SEEK MEDICAL CARE IF:  You have back pain.  You develop a fever.  Your symptoms do not begin to resolve within 3 days. SEEK IMMEDIATE MEDICAL CARE IF:   You have severe back pain or lower abdominal pain.  You develop chills.  You have nausea or vomiting.  You have continued burning or discomfort with urination. MAKE SURE YOU:   Understand these instructions.  Will watch your condition.  Will get help  right away if you are not doing well or get worse.   This information is not intended to replace advice given to you by your health care provider. Make sure you discuss any questions you have with your health care provider.   Document Released: 11/06/2004 Document Revised: 10/18/2014 Document Reviewed: 03/07/2011 Elsevier Interactive Patient Education [employer].

## 2015-07-03 LAB — COMPLETE METABOLIC PANEL WITH GFR
ALT: 10 U/L (ref 6–29)
AST: 14 U/L (ref 10–35)
Albumin: 4 g/dL (ref 3.6–5.1)
Alkaline Phosphatase: 112 U/L (ref 33–130)
BUN: 26 mg/dL — AB (ref 7–25)
CHLORIDE: 103 mmol/L (ref 98–110)
CO2: 23 mmol/L (ref 20–31)
Calcium: 9.4 mg/dL (ref 8.6–10.4)
Creat: 1 mg/dL — ABNORMAL HIGH (ref 0.50–0.99)
GFR, Est African American: 67 mL/min (ref >=60)
GFR, Est Non African American: 58 mL/min — ABNORMAL LOW (ref >=60)
GLUCOSE: 124 mg/dL — AB (ref 65–99)
POTASSIUM: 4.6 mmol/L (ref 3.5–5.3)
SODIUM: 137 mmol/L (ref 135–146)
Total Bilirubin: 0.4 mg/dL (ref 0.2–1.2)
Total Protein: 6.6 g/dL (ref 6.1–8.1)

## 2015-07-04 LAB — URINE CULTURE

## 2015-11-15 NOTE — Pre-Procedure Instructions (Signed)
Teresa Stanley  11/15/2015      [id number] [address], [place] [address] Phone: [phone] Fax: [phone]    Your procedure is scheduled on Mon, Oct 16 @ 12:30 PM  Report to [facility] Admitting at 10:30 AM  Call this number if you have problems the morning of surgery:  [phone]   Remember:  Do not eat food or drink liquids after midnight.  Take these medicines the morning of surgery with A SIP OF WATER Amlodipine(Norvasc),Eye Drops,Omeprazole(Prilosec), and Propranolol(Inderal)              Stop taking your Aspirin a week prior to surgery. No Goody's,BC's,Aleve,Advil,Motrin,Fish Oil,or any Herbal Medications.      How to Manage Your Diabetes Before and After Surgery  Why is it important to control my blood sugar before and after surgery? . Improving blood sugar levels before and after surgery helps healing and can limit problems. . A way of improving blood sugar control is eating a healthy diet by: o  Eating less sugar and carbohydrates o  Increasing activity/exercise o  Talking with your doctor about reaching your blood sugar goals . High blood sugars (greater than 180 mg/dL) can raise your risk of infections and slow your recovery, so you will need to focus on controlling your diabetes during the weeks before surgery. . Make sure that the doctor who takes care of your diabetes knows about your planned surgery including the date and location.  How do I manage my blood sugar before surgery? . Check your blood sugar at least 4 times a day, starting 2 days before surgery, to make sure that the level is not too high or low. o Check your blood sugar the morning of your surgery when you wake up and every 2 hours until you get to the Short Stay unit. . If your blood sugar is less than 70 mg/dL, you will need to treat for low blood sugar: o Do not take insulin. o Treat a low blood sugar (less  than 70 mg/dL) with  cup of clear juice (cranberry or apple), 4 glucose tablets, OR glucose gel. o Recheck blood sugar in 15 minutes after treatment (to make sure it is greater than 70 mg/dL). If your blood sugar is not greater than 70 mg/dL on recheck, call [phone] for further instructions. . Report your blood sugar to the short stay nurse when you get to Short Stay.  . If you are admitted to the hospital after surgery: o Your blood sugar will be checked by the staff and you will probably be given insulin after surgery (instead of oral diabetes medicines) to make sure you have good blood sugar levels. o The goal for blood sugar control after surgery is 80-180 mg/dL.              WHAT DO I DO ABOUT MY DIABETES MEDICATION?       [name] The day of surgery, do not take other diabetes injectables, including Byetta (exenatide), Bydureon (exenatide ER), Victoza (liraglutide), or Trulicity (dulaglutide).  . If your CBG is greater than 220 mg/dL, you may take  of your sliding scale (correction) dose of insulin.  Other Instructions:          Patient Signature:  Date:   Nurse Signature:  Date:   Reviewed and Endorsed by [facility] Patient Education Committee, August 2015   Do not wear jewelry, make-up or  nail polish.  Do not wear lotions, powders, perfumes, or deoderant.  Do not shave 48 hours prior to surgery.    Do not bring valuables to the hospital.  [facility] is not responsible for any belongings or valuables.  Contacts, dentures or bridgework may not be worn into surgery.  Leave your suitcase in the car.  After surgery it may be brought to your room.  For patients admitted to the hospital, discharge time will be determined by your treatment team.  Patients discharged the day of surgery will not be allowed to drive home.    [facility]  Before surgery, you can play an important role.  Because skin is not sterile, your skin  needs to be as free of germs as possible.  You can reduce the number of germs on you skin by washing with CHG (chlorahexidine gluconate) soap before surgery.  CHG is an antiseptic cleaner which kills germs and bonds with the skin to continue killing germs even after washing.  Please DO NOT use if you have an allergy to CHG or antibacterial soaps.  If your skin becomes reddened/irritated stop using the CHG and inform your nurse when you arrive at Short Stay.  Do not shave (including legs and underarms) for at least 48 hours prior to the first CHG shower.  You may shave your face.  Please follow these instructions carefully:   1.  Shower with CHG Soap the night before surgery and the                                morning of Surgery.  2.  If you choose to wash your hair, wash your hair first as usual with your       normal shampoo.  3.  After you shampoo, rinse your hair and body thoroughly to remove the                      Shampoo.  4.  Use CHG as you would any other liquid soap.  You can apply chg directly       to the skin and wash gently with scrungie or a clean washcloth.  5.  Apply the CHG Soap to your body ONLY FROM THE NECK DOWN.        Do not use on open wounds or open sores.  Avoid contact with your eyes,       ears, mouth and genitals (private parts).  Wash genitals (private parts)       with your normal soap.  6.  Wash thoroughly, paying special attention to the area where your surgery        will be performed.  7.  Thoroughly rinse your body with warm water from the neck down.  8.  DO NOT shower/wash with your normal soap after using and rinsing off       the CHG Soap.  9.  Pat yourself dry with a clean towel.            10.  Wear clean pajamas.            11.  Place clean sheets on your bed the night of your first shower and do not        sleep with pets.  Day of Surgery  Do not apply any lotions/deoderants the morning of surgery.  Please wear clean clothes to  the hospital/surgery  center.    Please read over the following fact sheets that you were given. Pain Booklet, Coughing and Deep Breathing, MRSA Information and Surgical Site Infection Prevention

## 2015-11-16 ENCOUNTER — Ambulatory Visit (HOSPITAL_COMMUNITY)
Admission: RE | Admit: 2015-11-16 | Discharge: 2015-11-16 | Disposition: A | Discharge status: Home / Self Care | Payer: Medicare Other | Source: Ambulatory Visit | Attending: Surgery | Admitting: Surgery

## 2015-11-16 ENCOUNTER — Encounter (HOSPITAL_COMMUNITY)
Admission: RE | Admit: 2015-11-16 | Discharge: 2015-11-16 | Disposition: A | Discharge status: Home / Self Care | Payer: Medicare Other | Source: Ambulatory Visit | Attending: Orthopaedic Surgery | Admitting: Orthopaedic Surgery

## 2015-11-16 ENCOUNTER — Encounter (HOSPITAL_COMMUNITY): Payer: Self-pay

## 2015-11-16 DIAGNOSIS — Z01810 Encounter for preprocedural cardiovascular examination: Secondary | ICD-10-CM | POA: Insufficient documentation

## 2015-11-16 DIAGNOSIS — Z01818 Encounter for other preprocedural examination: Secondary | ICD-10-CM

## 2015-11-16 DIAGNOSIS — Z01812 Encounter for preprocedural laboratory examination: Secondary | ICD-10-CM | POA: Insufficient documentation

## 2015-11-16 DIAGNOSIS — M1711 Unilateral primary osteoarthritis, right knee: Secondary | ICD-10-CM | POA: Insufficient documentation

## 2015-11-16 HISTORY — DX: Hypothyroidism, unspecified: E03.9

## 2015-11-16 LAB — TYPE AND SCREEN
ABO/RH(D): O POS
Antibody Screen: NEGATIVE

## 2015-11-16 LAB — COMPREHENSIVE METABOLIC PANEL
ALBUMIN: 4 g/dL (ref 3.5–5.0)
ALK PHOS: 94 U/L (ref 38–126)
ALT: 13 U/L — AB (ref 14–54)
AST: 17 U/L (ref 15–41)
Anion gap: 13 (ref 5–15)
BILIRUBIN TOTAL: 0.8 mg/dL (ref 0.3–1.2)
BUN: 23 mg/dL — AB (ref 6–20)
CALCIUM: 9.5 mg/dL (ref 8.9–10.3)
CO2: 24 mmol/L (ref 22–32)
CREATININE: 1.24 mg/dL — AB (ref 0.44–1.00)
Chloride: 103 mmol/L (ref 101–111)
GFR calc Af Amer: 50 mL/min — ABNORMAL LOW (ref >60)
GFR calc non Af Amer: 43 mL/min — ABNORMAL LOW (ref >60)
GLUCOSE: 145 mg/dL — AB (ref 65–99)
Potassium: 5 mmol/L (ref 3.5–5.1)
Sodium: 140 mmol/L (ref 135–145)
TOTAL PROTEIN: 7.2 g/dL (ref 6.5–8.1)

## 2015-11-16 LAB — SURGICAL PCR SCREEN
MRSA, PCR: NEGATIVE
Staphylococcus aureus: NEGATIVE

## 2015-11-16 LAB — CBC
HEMATOCRIT: 39.4 % (ref 36.0–46.0)
Hemoglobin: 12.1 g/dL (ref 12.0–15.0)
MCH: 29.4 pg (ref 26.0–34.0)
MCHC: 30.7 g/dL (ref 30.0–36.0)
MCV: 95.6 fL (ref 78.0–100.0)
Platelets: 302 10*3/uL (ref 150–400)
RBC: 4.12 MIL/uL (ref 3.87–5.11)
RDW: 14.6 % (ref 11.5–15.5)
WBC: 7.5 10*3/uL (ref 4.0–10.5)

## 2015-11-16 LAB — URINE MICROSCOPIC-ADD ON
BACTERIA UA: NONE SEEN
RBC / HPF: NONE SEEN RBC/hpf (ref 0–5)
WBC UA: NONE SEEN WBC/hpf (ref 0–5)

## 2015-11-16 LAB — URINALYSIS, ROUTINE W REFLEX MICROSCOPIC
Bilirubin Urine: NEGATIVE
Glucose, UA: NEGATIVE mg/dL
HGB URINE DIPSTICK: NEGATIVE
Ketones, ur: NEGATIVE mg/dL
NITRITE: NEGATIVE
PROTEIN: NEGATIVE mg/dL
SPECIFIC GRAVITY, URINE: 1.018 (ref 1.005–1.030)
pH: 6 (ref 5.0–8.0)

## 2015-11-16 LAB — APTT: APTT: 32 s (ref 24–36)

## 2015-11-16 LAB — PROTIME-INR
INR: 0.93
Prothrombin Time: 12.4 seconds (ref 11.4–15.2)

## 2015-11-16 LAB — GLUCOSE, CAPILLARY: GLUCOSE-CAPILLARY: 140 mg/dL — AB (ref 65–99)

## 2015-11-25 MED ORDER — DEXTROSE 5 % IV SOLN
3.0000 g | INTRAVENOUS | Status: AC
  Administered 2015-11-26: 3 g via INTRAVENOUS
  Filled 2015-11-25 (×3): qty 3000

## 2015-11-26 ENCOUNTER — Inpatient Hospital Stay (HOSPITAL_COMMUNITY): Payer: Medicare Other | Admitting: Anesthesiology

## 2015-11-26 ENCOUNTER — Encounter (HOSPITAL_COMMUNITY)
Admission: RE | Disposition: A | Discharge status: Skilled Nursing Facility | Payer: Self-pay | Source: Ambulatory Visit | Attending: Orthopaedic Surgery

## 2015-11-26 ENCOUNTER — Encounter (HOSPITAL_COMMUNITY): Payer: Self-pay | Admitting: *Deleted

## 2015-11-26 ENCOUNTER — Inpatient Hospital Stay (HOSPITAL_COMMUNITY)
Admission: RE | Admit: 2015-11-26 | Discharge: 2015-11-29 | DRG: 470 | Disposition: A | Discharge status: Skilled Nursing Facility | Payer: Medicare Other | Source: Ambulatory Visit | Attending: Orthopaedic Surgery | Admitting: Orthopaedic Surgery

## 2015-11-26 DIAGNOSIS — Z8249 Family history of ischemic heart disease and other diseases of the circulatory system: Secondary | ICD-10-CM | POA: Diagnosis not present

## 2015-11-26 DIAGNOSIS — I10 Essential (primary) hypertension: Secondary | ICD-10-CM | POA: Diagnosis present

## 2015-11-26 DIAGNOSIS — Z1889 Other specified retained foreign body fragments: Secondary | ICD-10-CM

## 2015-11-26 DIAGNOSIS — M1711 Unilateral primary osteoarthritis, right knee: Secondary | ICD-10-CM | POA: Diagnosis present

## 2015-11-26 DIAGNOSIS — Z833 Family history of diabetes mellitus: Secondary | ICD-10-CM | POA: Diagnosis not present

## 2015-11-26 DIAGNOSIS — Z7984 Long term (current) use of oral hypoglycemic drugs: Secondary | ICD-10-CM

## 2015-11-26 DIAGNOSIS — E119 Type 2 diabetes mellitus without complications: Secondary | ICD-10-CM | POA: Diagnosis present

## 2015-11-26 DIAGNOSIS — E039 Hypothyroidism, unspecified: Secondary | ICD-10-CM | POA: Diagnosis present

## 2015-11-26 DIAGNOSIS — Z6841 Body Mass Index (BMI) 40.0 and over, adult: Secondary | ICD-10-CM | POA: Diagnosis not present

## 2015-11-26 DIAGNOSIS — Z23 Encounter for immunization: Secondary | ICD-10-CM | POA: Diagnosis not present

## 2015-11-26 DIAGNOSIS — K219 Gastro-esophageal reflux disease without esophagitis: Secondary | ICD-10-CM | POA: Diagnosis present

## 2015-11-26 DIAGNOSIS — E785 Hyperlipidemia, unspecified: Secondary | ICD-10-CM | POA: Diagnosis present

## 2015-11-26 DIAGNOSIS — Z7982 Long term (current) use of aspirin: Secondary | ICD-10-CM | POA: Diagnosis not present

## 2015-11-26 DIAGNOSIS — Z825 Family history of asthma and other chronic lower respiratory diseases: Secondary | ICD-10-CM | POA: Diagnosis not present

## 2015-11-26 DIAGNOSIS — M25561 Pain in right knee: Secondary | ICD-10-CM | POA: Diagnosis present

## 2015-11-26 HISTORY — PX: TOTAL KNEE ARTHROPLASTY: SHX125

## 2015-11-26 LAB — GLUCOSE, CAPILLARY
GLUCOSE-CAPILLARY: 134 mg/dL — AB (ref 65–99)
GLUCOSE-CAPILLARY: 158 mg/dL — AB (ref 65–99)
GLUCOSE-CAPILLARY: 175 mg/dL — AB (ref 65–99)
Glucose-Capillary: 144 mg/dL — ABNORMAL HIGH (ref 65–99)

## 2015-11-26 SURGERY — ARTHROPLASTY, KNEE, TOTAL
Anesthesia: Regional | Laterality: Right

## 2015-11-26 MED ORDER — METFORMIN HCL 500 MG PO TABS
1000.0000 mg | ORAL_TABLET | Freq: Two times a day (BID) | ORAL | Status: DC
  Administered 2015-11-27 – 2015-11-29 (×5): 1000 mg via ORAL
  Filled 2015-11-26 (×5): qty 2

## 2015-11-26 MED ORDER — ROPIVACAINE HCL 7.5 MG/ML IJ SOLN
INTRAMUSCULAR | Status: DC | PRN
  Administered 2015-11-26: 20 mL via PERINEURAL

## 2015-11-26 MED ORDER — CHLORHEXIDINE GLUCONATE 4 % EX LIQD: 60.0000 mL | Freq: Once | CUTANEOUS | Status: DC

## 2015-11-26 MED ORDER — ALBUMIN HUMAN 5 % IV SOLN
INTRAVENOUS | Status: DC | PRN
  Administered 2015-11-26: 13:00:00 via INTRAVENOUS

## 2015-11-26 MED ORDER — SODIUM CHLORIDE 0.9 % IR SOLN
Status: DC | PRN
  Administered 2015-11-26: 3000 mL

## 2015-11-26 MED ORDER — ONDANSETRON HCL 4 MG/2ML IJ SOLN: 4.0000 mg | Freq: Once | INTRAMUSCULAR | Status: DC | PRN

## 2015-11-26 MED ORDER — HYDROMORPHONE HCL 1 MG/ML IJ SOLN: 0.5000 mg | INTRAMUSCULAR | Status: DC | PRN

## 2015-11-26 MED ORDER — SUCCINYLCHOLINE CHLORIDE 200 MG/10ML IV SOSY
PREFILLED_SYRINGE | INTRAVENOUS | Status: AC
  Filled 2015-11-26: qty 10

## 2015-11-26 MED ORDER — MIDAZOLAM HCL 2 MG/2ML IJ SOLN
INTRAMUSCULAR | Status: AC
  Filled 2015-11-26: qty 2

## 2015-11-26 MED ORDER — METOCLOPRAMIDE HCL 5 MG/ML IJ SOLN: 5.0000 mg | Freq: Three times a day (TID) | INTRAMUSCULAR | Status: DC | PRN

## 2015-11-26 MED ORDER — ACETAMINOPHEN 325 MG PO TABS: 650.0000 mg | ORAL_TABLET | Freq: Four times a day (QID) | ORAL | Status: DC | PRN

## 2015-11-26 MED ORDER — VITAMIN D (ERGOCALCIFEROL) 1.25 MG (50000 UNIT) PO CAPS
50000.0000 [IU] | ORAL_CAPSULE | ORAL | Status: DC
  Administered 2015-11-27: 50000 [IU] via ORAL
  Filled 2015-11-26: qty 1

## 2015-11-26 MED ORDER — PROPOFOL 10 MG/ML IV BOLUS
INTRAVENOUS | Status: AC
  Filled 2015-11-26: qty 40

## 2015-11-26 MED ORDER — MENTHOL 3 MG MT LOZG: 1.0000 | LOZENGE | OROMUCOSAL | Status: DC | PRN

## 2015-11-26 MED ORDER — CEFAZOLIN IN D5W 1 GM/50ML IV SOLN
1.0000 g | Freq: Three times a day (TID) | INTRAVENOUS | Status: AC
  Administered 2015-11-26 – 2015-11-27 (×2): 1 g via INTRAVENOUS
  Filled 2015-11-26 (×2): qty 50

## 2015-11-26 MED ORDER — METOCLOPRAMIDE HCL 5 MG PO TABS: 5.0000 mg | ORAL_TABLET | Freq: Three times a day (TID) | ORAL | Status: DC | PRN

## 2015-11-26 MED ORDER — FENTANYL CITRATE (PF) 100 MCG/2ML IJ SOLN
100.0000 ug | Freq: Once | INTRAMUSCULAR | Status: AC
  Administered 2015-11-26: 50 ug via INTRAVENOUS

## 2015-11-26 MED ORDER — PROPOFOL 10 MG/ML IV BOLUS
INTRAVENOUS | Status: DC | PRN
  Administered 2015-11-26: 100 mg via INTRAVENOUS

## 2015-11-26 MED ORDER — AMLODIPINE BESYLATE 10 MG PO TABS
10.0000 mg | ORAL_TABLET | Freq: Every day | ORAL | Status: DC
  Administered 2015-11-27 – 2015-11-29 (×3): 10 mg via ORAL
  Filled 2015-11-26 (×3): qty 1

## 2015-11-26 MED ORDER — PHENYLEPHRINE 40 MCG/ML (10ML) SYRINGE FOR IV PUSH (FOR BLOOD PRESSURE SUPPORT)
PREFILLED_SYRINGE | INTRAVENOUS | Status: AC
  Filled 2015-11-26: qty 10

## 2015-11-26 MED ORDER — ONDANSETRON HCL 4 MG/2ML IJ SOLN
INTRAMUSCULAR | Status: DC | PRN
  Administered 2015-11-26: 4 mg via INTRAVENOUS

## 2015-11-26 MED ORDER — SUCCINYLCHOLINE CHLORIDE 20 MG/ML IJ SOLN
INTRAMUSCULAR | Status: DC | PRN
  Administered 2015-11-26: 120 mg via INTRAVENOUS

## 2015-11-26 MED ORDER — FENTANYL CITRATE (PF) 100 MCG/2ML IJ SOLN
INTRAMUSCULAR | Status: AC
  Filled 2015-11-26: qty 2

## 2015-11-26 MED ORDER — CARBOXYMETHYLCELLULOSE SODIUM 0.5 % OP SOLN: 1.0000 [drp] | Freq: Every day | OPHTHALMIC | Status: DC

## 2015-11-26 MED ORDER — DEXTROSE 5 % IV SOLN
500.0000 mg | Freq: Four times a day (QID) | INTRAVENOUS | Status: DC | PRN
  Filled 2015-11-26: qty 5

## 2015-11-26 MED ORDER — LACTATED RINGERS IV SOLN
INTRAVENOUS | Status: DC
  Administered 2015-11-26 (×3): via INTRAVENOUS

## 2015-11-26 MED ORDER — FENTANYL CITRATE (PF) 100 MCG/2ML IJ SOLN
25.0000 ug | INTRAMUSCULAR | Status: DC | PRN
  Administered 2015-11-26 (×2): 50 ug via INTRAVENOUS

## 2015-11-26 MED ORDER — ROCURONIUM BROMIDE 100 MG/10ML IV SOLN
INTRAVENOUS | Status: DC | PRN
  Administered 2015-11-26: 50 mg via INTRAVENOUS

## 2015-11-26 MED ORDER — ONDANSETRON HCL 4 MG/2ML IJ SOLN
INTRAMUSCULAR | Status: AC
  Filled 2015-11-26: qty 2

## 2015-11-26 MED ORDER — PHENYLEPHRINE HCL 10 MG/ML IJ SOLN: INTRAVENOUS | Status: DC | PRN

## 2015-11-26 MED ORDER — SODIUM CHLORIDE 0.9 % IR SOLN
Status: DC | PRN
  Administered 2015-11-26: 1000 mL

## 2015-11-26 MED ORDER — POLYVINYL ALCOHOL 1.4 % OP SOLN
1.0000 [drp] | Freq: Every day | OPHTHALMIC | Status: DC
  Administered 2015-11-27 – 2015-11-29 (×3): 1 [drp] via OPHTHALMIC
  Filled 2015-11-26: qty 15

## 2015-11-26 MED ORDER — PHENOL 1.4 % MT LIQD: 1.0000 | OROMUCOSAL | Status: DC | PRN

## 2015-11-26 MED ORDER — PROPRANOLOL HCL ER 120 MG PO CP24
120.0000 mg | ORAL_CAPSULE | Freq: Every day | ORAL | Status: DC
  Administered 2015-11-27 – 2015-11-29 (×3): 120 mg via ORAL
  Filled 2015-11-26 (×3): qty 1

## 2015-11-26 MED ORDER — ONDANSETRON HCL 4 MG PO TABS: 4.0000 mg | ORAL_TABLET | Freq: Four times a day (QID) | ORAL | Status: DC | PRN

## 2015-11-26 MED ORDER — EPHEDRINE SULFATE 50 MG/ML IJ SOLN
INTRAMUSCULAR | Status: DC | PRN
  Administered 2015-11-26: 10 mg via INTRAVENOUS

## 2015-11-26 MED ORDER — PHENYLEPHRINE HCL 10 MG/ML IJ SOLN
INTRAMUSCULAR | Status: AC
  Filled 2015-11-26: qty 1

## 2015-11-26 MED ORDER — FENTANYL CITRATE (PF) 100 MCG/2ML IJ SOLN
INTRAMUSCULAR | Status: DC | PRN
  Administered 2015-11-26 (×5): 50 ug via INTRAVENOUS

## 2015-11-26 MED ORDER — INSULIN ASPART 100 UNIT/ML ~~LOC~~ SOLN
0.0000 [IU] | Freq: Three times a day (TID) | SUBCUTANEOUS | Status: DC
  Administered 2015-11-26: 4 [IU] via SUBCUTANEOUS
  Administered 2015-11-27: 7 [IU] via SUBCUTANEOUS
  Administered 2015-11-27: 4 [IU] via SUBCUTANEOUS
  Administered 2015-11-27: 7 [IU] via SUBCUTANEOUS
  Administered 2015-11-28 – 2015-11-29 (×2): 3 [IU] via SUBCUTANEOUS
  Administered 2015-11-29: 4 [IU] via SUBCUTANEOUS
  Filled 2015-11-26 (×25): qty 0.2

## 2015-11-26 MED ORDER — PHENYLEPHRINE HCL 10 MG/ML IJ SOLN
INTRAMUSCULAR | Status: DC | PRN
  Administered 2015-11-26 (×2): 80 ug via INTRAVENOUS

## 2015-11-26 MED ORDER — ATORVASTATIN CALCIUM 20 MG PO TABS
20.0000 mg | ORAL_TABLET | Freq: Every day | ORAL | Status: DC
  Administered 2015-11-26 – 2015-11-29 (×4): 20 mg via ORAL
  Filled 2015-11-26 (×4): qty 1

## 2015-11-26 MED ORDER — SUGAMMADEX SODIUM 200 MG/2ML IV SOLN
INTRAVENOUS | Status: DC | PRN
  Administered 2015-11-26: 300 mg via INTRAVENOUS

## 2015-11-26 MED ORDER — EPHEDRINE 5 MG/ML INJ
INTRAVENOUS | Status: AC
  Filled 2015-11-26: qty 10

## 2015-11-26 MED ORDER — LIDOCAINE 2% (20 MG/ML) 5 ML SYRINGE
INTRAMUSCULAR | Status: AC
  Filled 2015-11-26: qty 5

## 2015-11-26 MED ORDER — PHENYLEPHRINE HCL 10 MG/ML IJ SOLN
INTRAVENOUS | Status: DC | PRN
  Administered 2015-11-26: 35 ug/min via INTRAVENOUS

## 2015-11-26 MED ORDER — DOCUSATE SODIUM 100 MG PO CAPS
100.0000 mg | ORAL_CAPSULE | Freq: Two times a day (BID) | ORAL | Status: DC
  Administered 2015-11-26 – 2015-11-29 (×6): 100 mg via ORAL
  Filled 2015-11-26 (×6): qty 1

## 2015-11-26 MED ORDER — SUGAMMADEX SODIUM 500 MG/5ML IV SOLN
INTRAVENOUS | Status: AC
  Filled 2015-11-26: qty 5

## 2015-11-26 MED ORDER — ASPIRIN EC 325 MG PO TBEC
325.0000 mg | DELAYED_RELEASE_TABLET | Freq: Every day | ORAL | Status: DC
  Administered 2015-11-27 – 2015-11-29 (×3): 325 mg via ORAL
  Filled 2015-11-26 (×4): qty 1

## 2015-11-26 MED ORDER — SODIUM CHLORIDE 0.9 % IV SOLN
INTRAVENOUS | Status: DC
  Administered 2015-11-26: 19:00:00 via INTRAVENOUS

## 2015-11-26 MED ORDER — LISINOPRIL 20 MG PO TABS
20.0000 mg | ORAL_TABLET | Freq: Two times a day (BID) | ORAL | Status: DC
  Administered 2015-11-26 – 2015-11-29 (×6): 20 mg via ORAL
  Filled 2015-11-26 (×6): qty 1

## 2015-11-26 MED ORDER — POLYETHYLENE GLYCOL 3350 17 G PO PACK
17.0000 g | PACK | Freq: Every day | ORAL | Status: DC | PRN
Start: 2015-11-26 — End: 2015-11-29

## 2015-11-26 MED ORDER — METHOCARBAMOL 500 MG PO TABS
500.0000 mg | ORAL_TABLET | Freq: Four times a day (QID) | ORAL | Status: DC | PRN
  Administered 2015-11-26 – 2015-11-29 (×5): 500 mg via ORAL
  Filled 2015-11-26 (×6): qty 1

## 2015-11-26 MED ORDER — ONDANSETRON HCL 4 MG/2ML IJ SOLN: 4.0000 mg | Freq: Four times a day (QID) | INTRAMUSCULAR | Status: DC | PRN

## 2015-11-26 MED ORDER — ROCURONIUM BROMIDE 10 MG/ML (PF) SYRINGE
PREFILLED_SYRINGE | INTRAVENOUS | Status: AC
  Filled 2015-11-26: qty 10

## 2015-11-26 MED ORDER — PANTOPRAZOLE SODIUM 40 MG PO TBEC
40.0000 mg | DELAYED_RELEASE_TABLET | Freq: Every day | ORAL | Status: DC
  Administered 2015-11-27 – 2015-11-29 (×3): 40 mg via ORAL
  Filled 2015-11-26 (×3): qty 1

## 2015-11-26 MED ORDER — TRIAMTERENE-HCTZ 37.5-25 MG PO TABS
1.0000 | ORAL_TABLET | Freq: Every day | ORAL | Status: DC
  Administered 2015-11-27 – 2015-11-29 (×3): 1 via ORAL
  Filled 2015-11-26 (×3): qty 1

## 2015-11-26 MED ORDER — PIOGLITAZONE HCL 45 MG PO TABS
45.0000 mg | ORAL_TABLET | Freq: Every day | ORAL | Status: DC
  Administered 2015-11-27 – 2015-11-29 (×3): 45 mg via ORAL
  Filled 2015-11-26 (×3): qty 1

## 2015-11-26 MED ORDER — ACETAMINOPHEN 650 MG RE SUPP: 650.0000 mg | Freq: Four times a day (QID) | RECTAL | Status: DC | PRN

## 2015-11-26 MED ORDER — OXYCODONE HCL 5 MG PO TABS
5.0000 mg | ORAL_TABLET | ORAL | Status: DC | PRN
  Administered 2015-11-26 – 2015-11-27 (×5): 10 mg via ORAL
  Administered 2015-11-27: 5 mg via ORAL
  Administered 2015-11-27 – 2015-11-29 (×5): 10 mg via ORAL
  Filled 2015-11-26 (×11): qty 2

## 2015-11-26 MED ORDER — LIDOCAINE HCL (CARDIAC) 20 MG/ML IV SOLN
INTRAVENOUS | Status: DC | PRN
  Administered 2015-11-26: 100 mg via INTRAVENOUS

## 2015-11-26 SURGICAL SUPPLY — 73 items
APL SKNCLS STERI-STRIP NONHPOA (GAUZE/BANDAGES/DRESSINGS)
BANDAGE ACE 4X5 VEL STRL LF (GAUZE/BANDAGES/DRESSINGS) ×3 IMPLANT
BANDAGE ELASTIC 6 VELCRO ST LF (GAUZE/BANDAGES/DRESSINGS) ×2 IMPLANT
BANDAGE ESMARK 6X9 LF (GAUZE/BANDAGES/DRESSINGS) ×1 IMPLANT
BENZOIN TINCTURE PRP APPL 2/3 (GAUZE/BANDAGES/DRESSINGS) ×1 IMPLANT
BLADE SAGITTAL 25.0X1.19X90 (BLADE) ×2 IMPLANT
BLADE SAGITTAL 25.0X1.19X90MM (BLADE) ×1
BLADE SAW SGTL 13X75X1.27 (BLADE) ×3 IMPLANT
BNDG CMPR 9X6 STRL LF SNTH (GAUZE/BANDAGES/DRESSINGS) ×1
BNDG CMPR MED 10X6 ELC LF (GAUZE/BANDAGES/DRESSINGS) ×1
BNDG ELASTIC 6X10 VLCR STRL LF (GAUZE/BANDAGES/DRESSINGS) ×3 IMPLANT
BNDG ESMARK 6X9 LF (GAUZE/BANDAGES/DRESSINGS) ×3
BOWL SMART MIX CTS (DISPOSABLE) ×3 IMPLANT
CAPT KNEE TOTAL 3 ATTUNE ×2 IMPLANT
CEMENT HV SMART SET (Cement) ×6 IMPLANT
CLOSURE WOUND 1/2 X4 (GAUZE/BANDAGES/DRESSINGS) ×2
COVER SURGICAL LIGHT HANDLE (MISCELLANEOUS) ×3 IMPLANT
CUFF TOURNIQUET SINGLE 34IN LL (TOURNIQUET CUFF) ×3 IMPLANT
CUFF TOURNIQUET SINGLE 44IN (TOURNIQUET CUFF) IMPLANT
DRAPE ORTHO SPLIT 77X108 STRL (DRAPES) ×6
DRAPE SURG ORHT 6 SPLT 77X108 (DRAPES) ×2 IMPLANT
DRAPE U-SHAPE 47X51 STRL (DRAPES) ×3 IMPLANT
DRSG PAD ABDOMINAL 8X10 ST (GAUZE/BANDAGES/DRESSINGS) ×3 IMPLANT
DURAPREP 26ML APPLICATOR (WOUND CARE) ×5 IMPLANT
ELECT REM PT RETURN 9FT ADLT (ELECTROSURGICAL) ×3
ELECTRODE REM PT RTRN 9FT ADLT (ELECTROSURGICAL) ×1 IMPLANT
EVACUATOR 1/8 PVC DRAIN (DRAIN) IMPLANT
FACESHIELD WRAPAROUND (MASK) ×3 IMPLANT
FACESHIELD WRAPAROUND OR TEAM (MASK) ×1 IMPLANT
GAUZE SPONGE 4X4 12PLY STRL (GAUZE/BANDAGES/DRESSINGS) ×3 IMPLANT
GAUZE XEROFORM 5X9 LF (GAUZE/BANDAGES/DRESSINGS) ×3 IMPLANT
GLOVE BIOGEL PI IND STRL 8 (GLOVE) ×2 IMPLANT
GLOVE BIOGEL PI INDICATOR 8 (GLOVE) ×4
GLOVE ORTHO TXT STRL SZ7.5 (GLOVE) ×6 IMPLANT
GOWN STRL REUS W/ TWL LRG LVL3 (GOWN DISPOSABLE) ×1 IMPLANT
GOWN STRL REUS W/ TWL XL LVL3 (GOWN DISPOSABLE) ×1 IMPLANT
GOWN STRL REUS W/TWL 2XL LVL3 (GOWN DISPOSABLE) ×3 IMPLANT
GOWN STRL REUS W/TWL LRG LVL3 (GOWN DISPOSABLE) ×3
GOWN STRL REUS W/TWL XL LVL3 (GOWN DISPOSABLE) ×3
HANDPIECE INTERPULSE COAX TIP (DISPOSABLE) ×3
IMMOBILIZER KNEE 22 (SOFTGOODS) ×2 IMPLANT
IMMOBILIZER KNEE 22 UNIV (SOFTGOODS) ×3 IMPLANT
KIT BASIN OR (CUSTOM PROCEDURE TRAY) ×3 IMPLANT
KIT ROOM TURNOVER OR (KITS) ×3 IMPLANT
MANIFOLD NEPTUNE II (INSTRUMENTS) ×3 IMPLANT
MARKER SKIN DUAL TIP RULER LAB (MISCELLANEOUS) ×3 IMPLANT
NDL HYPO 25GX1X1/2 BEV (NEEDLE) ×1 IMPLANT
NEEDLE HYPO 25GX1X1/2 BEV (NEEDLE) ×3 IMPLANT
NS IRRIG 1000ML POUR BTL (IV SOLUTION) ×3 IMPLANT
PACK TOTAL JOINT (CUSTOM PROCEDURE TRAY) ×3 IMPLANT
PACK UNIVERSAL I (CUSTOM PROCEDURE TRAY) ×3 IMPLANT
PAD ABD 8X10 STRL (GAUZE/BANDAGES/DRESSINGS) ×2 IMPLANT
PAD ARMBOARD 7.5X6 YLW CONV (MISCELLANEOUS) ×6 IMPLANT
PAD CAST 4YDX4 CTTN HI CHSV (CAST SUPPLIES) ×1 IMPLANT
PADDING CAST ABS 6INX4YD NS (CAST SUPPLIES) ×2
PADDING CAST ABS COTTON 6X4 NS (CAST SUPPLIES) IMPLANT
PADDING CAST COTTON 4X4 STRL (CAST SUPPLIES) ×3
PADDING CAST COTTON 6X4 STRL (CAST SUPPLIES) ×3 IMPLANT
SET HNDPC FAN SPRY TIP SCT (DISPOSABLE) ×1 IMPLANT
SPONGE GAUZE 4X4 12PLY STER LF (GAUZE/BANDAGES/DRESSINGS) ×2 IMPLANT
STAPLER VISISTAT 35W (STAPLE) ×3 IMPLANT
STRIP CLOSURE SKIN 1/2X4 (GAUZE/BANDAGES/DRESSINGS) ×4 IMPLANT
SUCTION FRAZIER HANDLE 10FR (MISCELLANEOUS) ×2
SUCTION TUBE FRAZIER 10FR DISP (MISCELLANEOUS) ×1 IMPLANT
SUT VIC AB 1 CTX 18 (SUTURE) ×6 IMPLANT
SUT VIC AB 2-0 CT1 27 (SUTURE) ×6
SUT VIC AB 2-0 CT1 TAPERPNT 27 (SUTURE) ×2 IMPLANT
SUT VIC AB 3-0 X1 27 (SUTURE) ×3 IMPLANT
SYR CONTROL 10ML LL (SYRINGE) ×3 IMPLANT
TOWEL OR 17X24 6PK STRL BLUE (TOWEL DISPOSABLE) ×3 IMPLANT
TOWEL OR 17X26 10 PK STRL BLUE (TOWEL DISPOSABLE) ×3 IMPLANT
TRAY FOLEY CATH 16FRSI W/METER (SET/KITS/TRAYS/PACK) ×3 IMPLANT
WATER STERILE IRR 1000ML POUR (IV SOLUTION) ×6 IMPLANT

## 2015-11-26 NOTE — Anesthesia Procedure Notes (Signed)
Anesthesia Regional Block:  Adductor canal block  Pre-Anesthetic Checklist: ,, timeout performed, Correct Patient, Correct Site, Correct Laterality, Correct Procedure, Correct Position, site marked, Risks and benefits discussed,  Surgical consent,  Pre-op evaluation,  At surgeon's request and post-op pain management  Laterality: Right  Prep: chloraprep       Needles:  Injection technique: Single-shot  Needle Type: Echogenic Needle     Needle Length: 9cm 9 cm Needle Gauge: 21 and 21 G    Additional Needles:  Procedures: ultrasound guided (picture in chart) Adductor canal block Narrative:  Injection made incrementally with aspirations every 5 mL.  Performed by: Personally  Anesthesiologist: Graciemae Delisle EDWARD  Additional Notes: No pain on injection. No increased resistance to injection. Injection made in 5cc increments.  Good needle visualization.  Patient tolerated procedure well.      

## 2015-11-26 NOTE — Interval H&P Note (Signed)
History and Physical Interval Note:  11/26/2015 12:31 PM  Teresa Stanley  has presented today for surgery, with the diagnosis of Right Knee Osteoarthritis  The various methods of treatment have been discussed with the patient and family. After consideration of risks, benefits and other options for treatment, the patient has consented to  Procedure(s): TOTAL KNEE ARTHROPLASTY (Right) as a surgical intervention .  The patient's history has been reviewed, patient examined, no change in status, stable for surgery.  I have reviewed the patient's chart and labs.  Questions were answered to the patient's satisfaction.     Marybelle Killings

## 2015-11-26 NOTE — Transfer of Care (Signed)
Immediate Anesthesia Transfer of Care Note  Patient: Teresa Stanley  Procedure(s) Performed: Procedure(s): TOTAL KNEE ARTHROPLASTY (Right)  Patient Location: PACU  Anesthesia Type:General  Level of Consciousness: awake, alert , oriented and patient cooperative  Airway & Oxygen Therapy: Patient Spontanous Breathing and Patient connected to face mask oxygen  Post-op Assessment: Report given to RN and Post -op Vital signs reviewed and stable  Post vital signs: Reviewed and stable  Last Vitals:  Vitals:   11/26/15 1027 11/26/15 1512  BP: (!) 192/86   Pulse: 85   Resp: 20   Temp: 36.4 C 36.5 C    Last Pain:  Vitals:   11/26/15 1512  TempSrc:   PainSc: 0-No pain      Patients Stated Pain Goal: 3 ([id number])  Complications: No apparent anesthesia complications

## 2015-11-26 NOTE — Anesthesia Preprocedure Evaluation (Addendum)
Anesthesia Evaluation  Patient identified by MRN, date of birth, ID band Patient awake    Reviewed: Allergy & Precautions, NPO status , Patient's Chart, lab work & pertinent test results, reviewed documented beta blocker date and time   Airway Mallampati: II  TM Distance: >3 FB Neck ROM: Full    Dental  (+) Dental Advisory Given, Lower Dentures, Upper Dentures   Pulmonary neg pulmonary ROS,    Pulmonary exam normal breath sounds clear to auscultation       Cardiovascular hypertension, Pt. on medications and Pt. on home beta blockers Normal cardiovascular exam Rhythm:Regular Rate:Normal     Neuro/Psych  Headaches,    GI/Hepatic Neg liver ROS, GERD  Medicated,  Endo/Other  diabetes, Type 2, Oral Hypoglycemic AgentsHypothyroidism Morbid obesity  Renal/GU Renal InsufficiencyRenal disease     Musculoskeletal  (+) Arthritis , Osteoarthritis,    Abdominal   Peds  Hematology negative hematology ROS (+) Plt 302k   Anesthesia Other Findings Day of surgery medications reviewed with the patient.  Reproductive/Obstetrics                            Anesthesia Physical Anesthesia Plan  ASA: III  Anesthesia Plan: General and Regional   Post-op Pain Management:  Regional for Post-op pain   Induction: Intravenous  Airway Management Planned: Oral ETT and Video Laryngoscope Planned  Additional Equipment:   Intra-op Plan:   Post-operative Plan: Extubation in OR  Informed Consent: I have reviewed the patients History and Physical, chart, labs and discussed the procedure including the risks, benefits and alternatives for the proposed anesthesia with the patient or authorized representative who has indicated his/her understanding and acceptance.   Dental advisory given  Plan Discussed with: CRNA  Anesthesia Plan Comments: (Risks/benefits of general anesthesia discussed with patient including risk of  damage to teeth, lips, gum, and tongue, nausea/vomiting, allergic reactions to medications, and the possibility of heart attack, stroke and death.  All patient questions answered.  Patient wishes to proceed.  Discussed risks and benefits of adductor canal block including failure, bleeding, infection, nerve damage, weakness. Discussed that the block may not prevent all of the pain in the knee. Questions answered. Patient consents to block. )        Anesthesia Quick Evaluation

## 2015-11-26 NOTE — H&P (Signed)
TOTAL KNEE ADMISSION H&P  Patient is being admitted for right total knee arthroplasty.  Subjective:  Chief Complaint:right knee pain.  HPI: Teresa Stanley, 70 y.o. female, has a history of pain and functional disability in the right knee due to arthritis and has failed non-surgical conservative treatments for greater than 12 weeks to includeNSAID's and/or analgesics, corticosteriod injections, use of assistive devices and activity modification.  Onset of symptoms was gradual, starting 10 years ago with gradually worsening course since that time.  Patient currently rates pain in the right knee(s) at 10 out of 10 with activity. Patient has night pain, worsening of pain with activity and weight bearing, pain that interferes with activities of daily living, crepitus and joint swelling.  Patient has evidence of . [name] There is no active infection.  Patient Active Problem List   Diagnosis Date Noted  . S/P total knee arthroplasty 04/18/2013  . Osteoarthritis of left knee 04/04/2013    Class: Diagnosis of  . [place] intertrigo 08/09/2012  . Hypertension 12/15/2011  . Diabetes mellitus ([place]) 12/15/2011   Past Medical History:  Diagnosis Date  . Arthritis   . Diabetes mellitus    takes Actos and Metformin daily and Victoza  . GERD (gastroesophageal reflux disease)    takes Omeprazole daily  . Headache(784.0)    occasionally  . Hyperlipidemia    takes Atorvastatin daily  . Hypertension    takes Lisinopril,Amlodipine,and Metoprolol daily  . Hypothyroidism   . Joint pain   . Thyroid disease    ?    Past Surgical History:  Procedure Laterality Date  . ABDOMINAL HYSTERECTOMY    . BREAST BIOPSY Left   . CARDIAC CATHETERIZATION  2009  . CATARACT EXTRACTION Right   . EYE SURGERY    . JOINT REPLACEMENT    . KNEE ARTHROPLASTY Left 04/04/2013   Procedure: COMPUTER ASSISTED TOTAL KNEE ARTHROPLASTY;  Surgeon: Marybelle Killings, MD;  Location: [facility];  Service: Orthopedics;  Laterality: Left;  Left  Total Knee Arthroplasty, Cemented    Prescriptions Prior to Admission  Medication Sig Dispense Refill Last Dose  . acetaminophen (TYLENOL) 500 MG tablet Take 500 mg by mouth every 6 (six) hours as needed. For pain.   >1 wk ago at Unknown time  . amLODipine (NORVASC) 10 MG tablet Take 10 mg by mouth daily.   11/26/2015 at 0800  . aspirin 81 MG tablet Take 81 mg by mouth every morning.    > 1 wk ago  . atorvastatin (LIPITOR) 20 MG tablet Take 20 mg by mouth daily.   11/25/2015 at Unknown time  . carboxymethylcellulose (REFRESH PLUS) 0.5 % SOLN Place 1 drop into both eyes daily.   11/26/2015 at 0800  . lisinopril (PRINIVIL,ZESTRIL) 20 MG tablet Take 1 tablet (20 mg total) by mouth 2 (two) times daily. 60 tablet 1 11/25/2015 at Unknown time  . metFORMIN (GLUCOPHAGE) 1000 MG tablet Take 1,000 mg by mouth 2 (two) times daily with a meal.   11/25/2015 at Unknown time  . omeprazole (PRILOSEC) 20 MG capsule Take 20 mg by mouth daily.   11/26/2015 at 0800  . pioglitazone (ACTOS) 45 MG tablet Take 45 mg by mouth every morning.   11/25/2015 at Unknown time  . propranolol ER (INDERAL LA) 120 MG 24 hr capsule Take 120 mg by mouth every morning.   11/26/2015 at 0800  . triamterene-hydrochlorothiazide (MAXZIDE-25) 37.5-25 MG tablet Take 1 tablet by mouth every morning.   11/25/2015 at Unknown time  . Vitamin  D, Ergocalciferol, (DRISDOL) 50000 units CAPS capsule Take 50,000 Units by mouth 2 (two) times a week. Tuesday and Friday  2 Past Week at Unknown time  . triamcinolone cream (KENALOG) 0.1 % 1 application daily as needed.   Unknown at Unknown time   No Known Allergies  Social History  Substance Use Topics  . Smoking status: Never Smoker  . Smokeless tobacco: Never Used  . Alcohol use No    Family History  Problem Relation Age of Onset  . Diabetes Mother   . Hypertension Mother   . Diabetes Father   . Asthma Father   . Hypertension Father   . Hypertension Daughter   . Hypertension Sister     2   . Diabetes Sister   . Hypertension Brother     5  . Kidney disease Brother      Review of Systems  Constitutional: Negative.   HENT: Negative.   Respiratory: Negative.   Cardiovascular: Negative.   Gastrointestinal: Negative.   Genitourinary: Negative.   Musculoskeletal: Positive for joint pain.  Skin: Negative.   Neurological: Negative.   Endo/Heme/Allergies: Negative.   Psychiatric/Behavioral: Negative.     Objective:  Physical Exam  Constitutional: She is oriented to person, place, and time. She appears well-developed.  HENT:  Head: Normocephalic and atraumatic.  Eyes: EOM are normal. Pupils are equal, round, and reactive to light.  Neck: Normal range of motion.  Respiratory: She is in respiratory distress.  Musculoskeletal: She exhibits tenderness.  Neurological: She is alert and oriented to person, place, and time.  Skin: Skin is warm.  Psychiatric: She has a normal mood and affect.    Vital signs in last 24 hours: Temp:  [97.5 F (36.4 C)] 97.5 F (36.4 C) (10/16 1027) Pulse Rate:  [85] 85 (10/16 1027) Resp:  [20] 20 (10/16 1027) BP: (192)/(86) 192/86 (10/16 1027) SpO2:  [100 %] 100 % (10/16 1027) Weight:  [123.4 kg (272 lb)] 123.4 kg (272 lb) (10/16 1027)  Labs:   Estimated body mass index is 49.75 kg/m as calculated from the following:   Height as of 11/16/15: 5\' 2"  (1.575 m).   Weight as of this encounter: 123.4 kg (272 lb).   Imaging Review Plain radiographs demonstrate moderate degenerative joint disease of the right knee(s). The overall alignment ismild varus. The bone quality appears to be good for age and reported activity level.  Assessment/Plan:  End stage arthritis, right knee   The patient history, physical examination, clinical judgment of the provider and imaging studies are consistent with end stage degenerative joint disease of the right knee(s) and total knee arthroplasty is deemed medically necessary. The treatment options  including medical management, injection therapy arthroscopy and arthroplasty were discussed at length. The risks and benefits of total knee arthroplasty were presented and reviewed. The risks due to aseptic loosening, infection, stiffness, patella tracking problems, thromboembolic complications and other imponderables were discussed. The patient acknowledged the explanation, agreed to proceed with the plan and consent was signed. Patient is being admitted for inpatient treatment for surgery, pain control, PT, OT, prophylactic antibiotics, VTE prophylaxis, progressive ambulation and ADL's and discharge planning. The patient is planning to be discharged home with home health services

## 2015-11-26 NOTE — Anesthesia Procedure Notes (Signed)
Procedure Name: Intubation Date/Time: 11/26/2015 12:52 PM Performed by: Scheryl Darter Pre-anesthesia Checklist: Patient identified, Emergency Drugs available, Suction available and Patient being monitored Patient Re-evaluated:Patient Re-evaluated prior to inductionOxygen Delivery Method: Circle System Utilized Preoxygenation: Pre-oxygenation with 100% oxygen Intubation Type: IV induction Ventilation: Mask ventilation without difficulty Laryngoscope Size: Glidescope Grade View: Grade I Tube type: Oral Tube size: 7.5 mm Number of attempts: 1 Airway Equipment and Method: Oral airway,  Rigid stylet and Video-laryngoscopy Placement Confirmation: ETT inserted through vocal cords under direct vision,  positive ETCO2 and breath sounds checked- equal and bilateral Secured at: 23 cm Tube secured with: Tape Dental Injury: Teeth and Oropharynx as per pre-operative assessment

## 2015-11-26 NOTE — Brief Op Note (Signed)
11/26/2015  3:12 PM  PATIENT:  [name]  70 y.o. female  PRE-OPERATIVE DIAGNOSIS:  Right Knee Osteoarthritis  POST-OPERATIVE DIAGNOSIS:  Right Knee Osteoarthritis  PROCEDURE:  Procedure(s): TOTAL KNEE ARTHROPLASTY (Right)  SURGEON:  Surgeon(s) and Role:    Marybelle Killings, MD - Primary  PHYSICIAN ASSISTANT: Abigaile Rossie m. Karmon Andis pa-c    ANESTHESIA:   general  EBL:  Total I/O In: 1250 [I.V.:1000; IV Piggyback:250] Out: 350 [Urine:300; Blood:50]  BLOOD ADMINISTERED:none  DRAINS: none   LOCAL MEDICATIONS USED:  NONE  SPECIMEN:  No Specimen  DISPOSITION OF SPECIMEN:  N/A  COUNTS:  YES  TOURNIQUET:   Total Tourniquet Time Documented: Thigh (Right) - 58 minutes Total: Thigh (Right) - 58 minutes   DICTATION: .Viviann Spare Dictation  PLAN OF CARE: Admit to inpatient   PATIENT DISPOSITION:  PACU - hemodynamically stable.

## 2015-11-26 NOTE — Progress Notes (Signed)
Orthopedic Tech Progress Note Patient Details:  Teresa Stanley April 07, 1945 OK:[id number]  CPM Right Knee CPM Right Knee: On Right Knee Flexion (Degrees): 90 Right Knee Extension (Degrees): 0 Additional Comments: Trapeze bar and foot roll   Teresa Stanley 11/26/2015, 4:02 PM

## 2015-11-27 ENCOUNTER — Encounter (HOSPITAL_COMMUNITY): Payer: Self-pay | Admitting: Orthopaedic Surgery

## 2015-11-27 LAB — BASIC METABOLIC PANEL
Anion gap: 9 (ref 5–15)
BUN: 24 mg/dL — AB (ref 6–20)
CALCIUM: 8.2 mg/dL — AB (ref 8.9–10.3)
CO2: 26 mmol/L (ref 22–32)
CREATININE: 1.62 mg/dL — AB (ref 0.44–1.00)
Chloride: 103 mmol/L (ref 101–111)
GFR calc non Af Amer: 31 mL/min — ABNORMAL LOW (ref >60)
GFR, EST AFRICAN AMERICAN: 36 mL/min — AB (ref >60)
GLUCOSE: 189 mg/dL — AB (ref 65–99)
Potassium: 4.5 mmol/L (ref 3.5–5.1)
Sodium: 138 mmol/L (ref 135–145)

## 2015-11-27 LAB — CBC
HEMATOCRIT: 31.6 % — AB (ref 36.0–46.0)
Hemoglobin: 10 g/dL — ABNORMAL LOW (ref 12.0–15.0)
MCH: 29.9 pg (ref 26.0–34.0)
MCHC: 31.6 g/dL (ref 30.0–36.0)
MCV: 94.3 fL (ref 78.0–100.0)
Platelets: 234 10*3/uL (ref 150–400)
RBC: 3.35 MIL/uL — ABNORMAL LOW (ref 3.87–5.11)
RDW: 15.1 % (ref 11.5–15.5)
WBC: 10.5 10*3/uL (ref 4.0–10.5)

## 2015-11-27 LAB — GLUCOSE, CAPILLARY
GLUCOSE-CAPILLARY: 240 mg/dL — AB (ref 65–99)
GLUCOSE-CAPILLARY: 209 mg/dL — AB (ref 65–99)
Glucose-Capillary: 176 mg/dL — ABNORMAL HIGH (ref 65–99)
Glucose-Capillary: 127 mg/dL — ABNORMAL HIGH (ref 65–99)

## 2015-11-27 IMAGING — CT CT ABD-PELV W/ CM
2 of 5 series · 11 of 46 positions shown, 12 images · IV contrast (Iodine)
Comparison: CT of the abdomen and pelvis 04/20/2009

CLINICAL DATA: Left-sided abdominal pain for 3 days. No nausea,
vomiting.

EXAM:
CT ABDOMEN AND PELVIS WITH CONTRAST
TECHNIQUE: Multidetector CT imaging of the abdomen and pelvis was performed
using the standard protocol following bolus administration of
intravenous contrast.
CONTRAST:  100mL OMNIPAQUE IOHEXOL 300 MG/ML  SOLN

[Series 201: routine, idose (2) · axial · 0.87mm/px · z∈[+557,+962]mm · 8 of 93 slices shown, 9 images]
[im 6/93  soft-tissue]
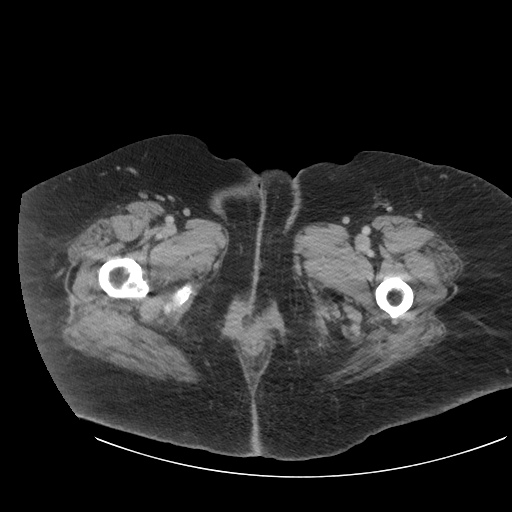
[im 6/93  bone]
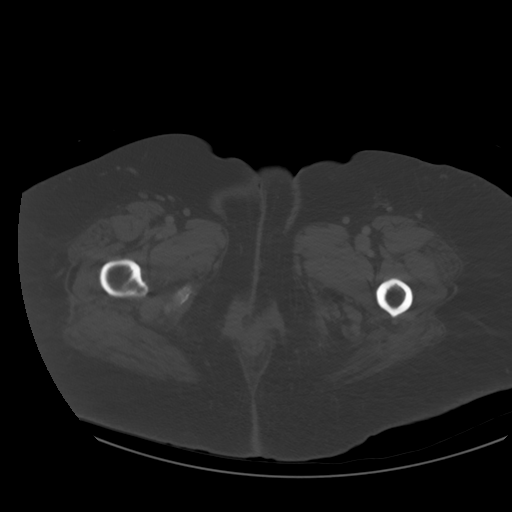
[im 18/93  soft-tissue]
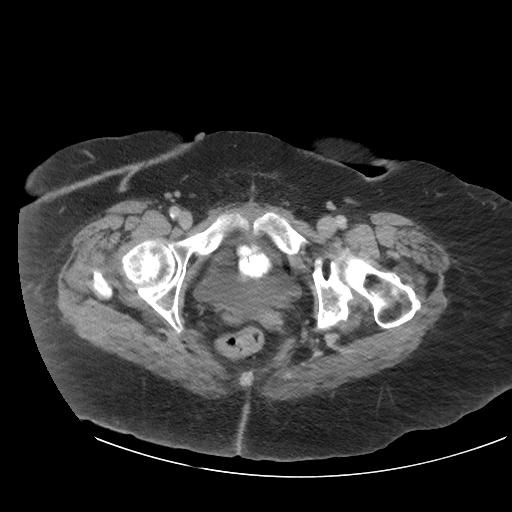
[im 29/93  soft-tissue]
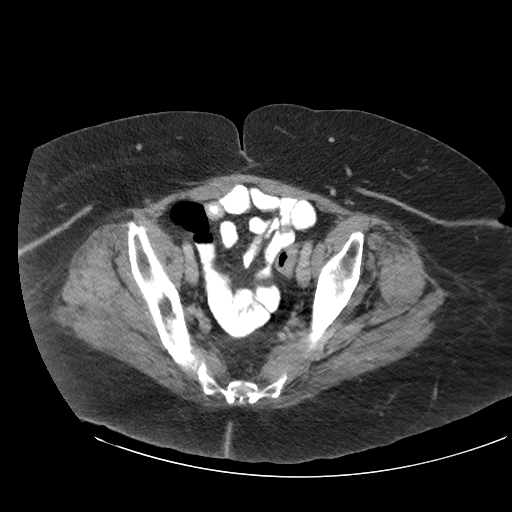
[im 41/93  soft-tissue]
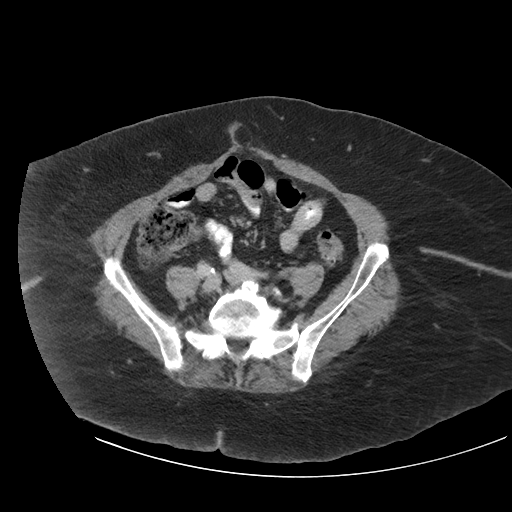
[im 52/93  soft-tissue]
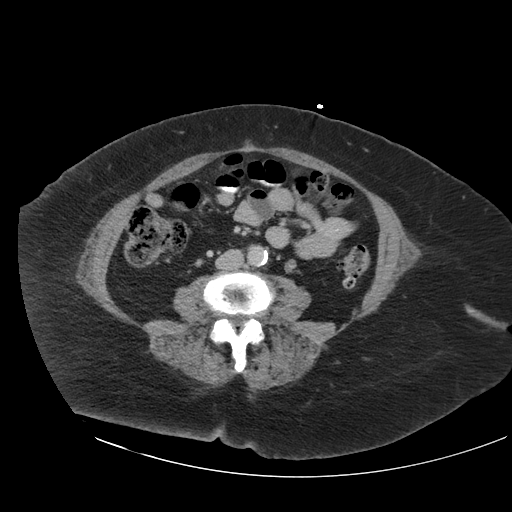
[im 64/93  soft-tissue]
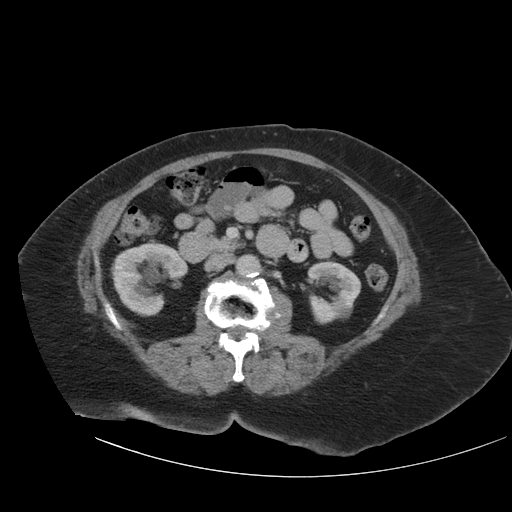
[im 75/93  soft-tissue]
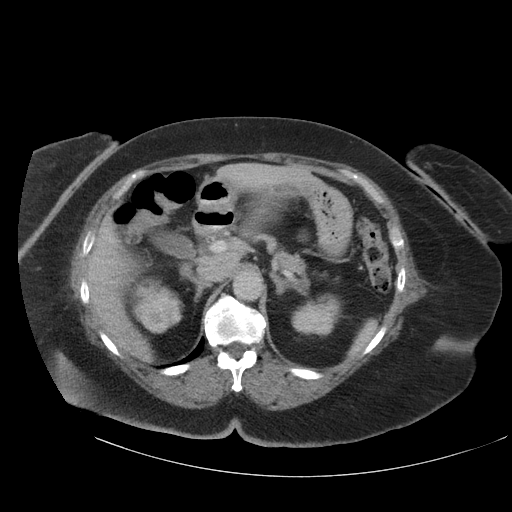
[im 87/93  soft-tissue]
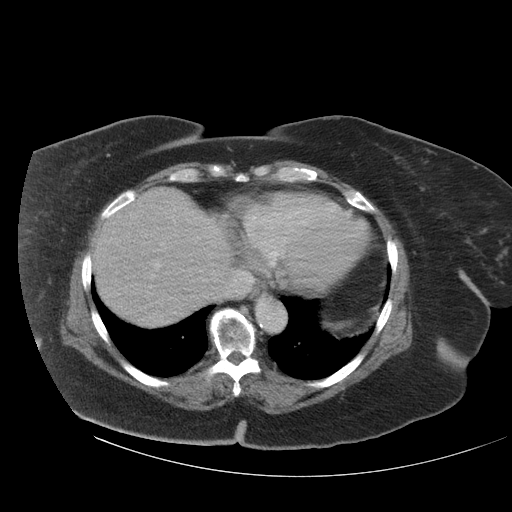

[Series 202: coronals, idose (2) · coronal · 0.50mm/px · 3 of 155 slices shown]
[im 52/155  soft-tissue]
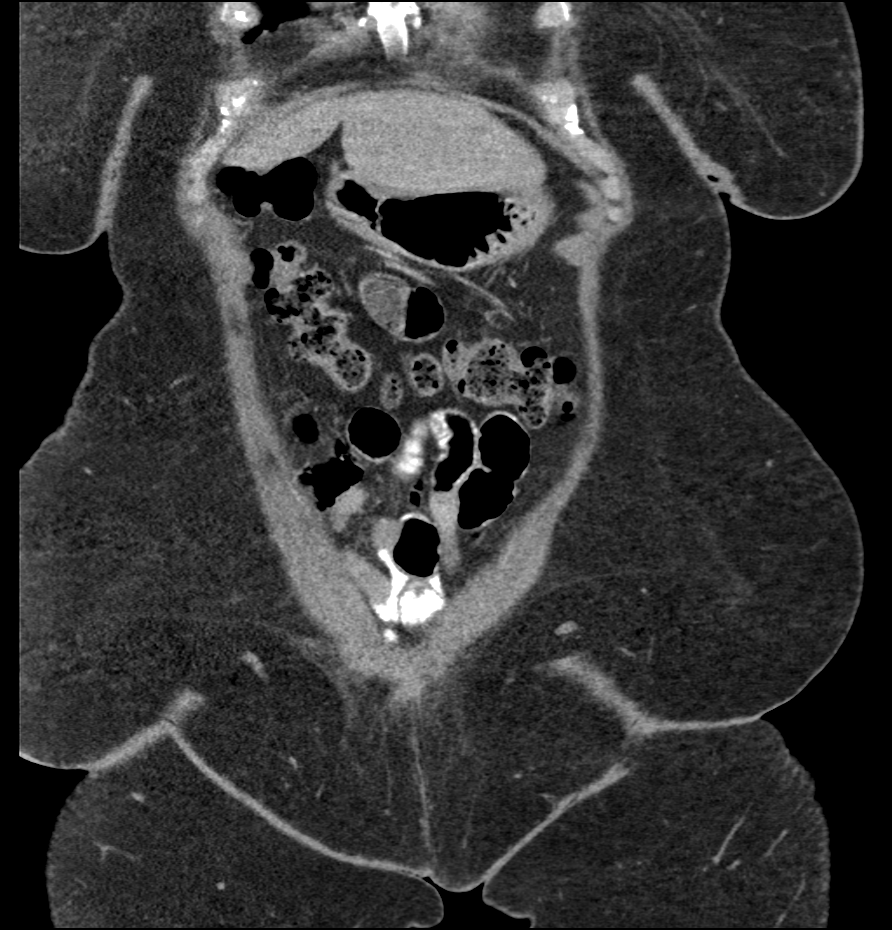
[im 69/155  soft-tissue]
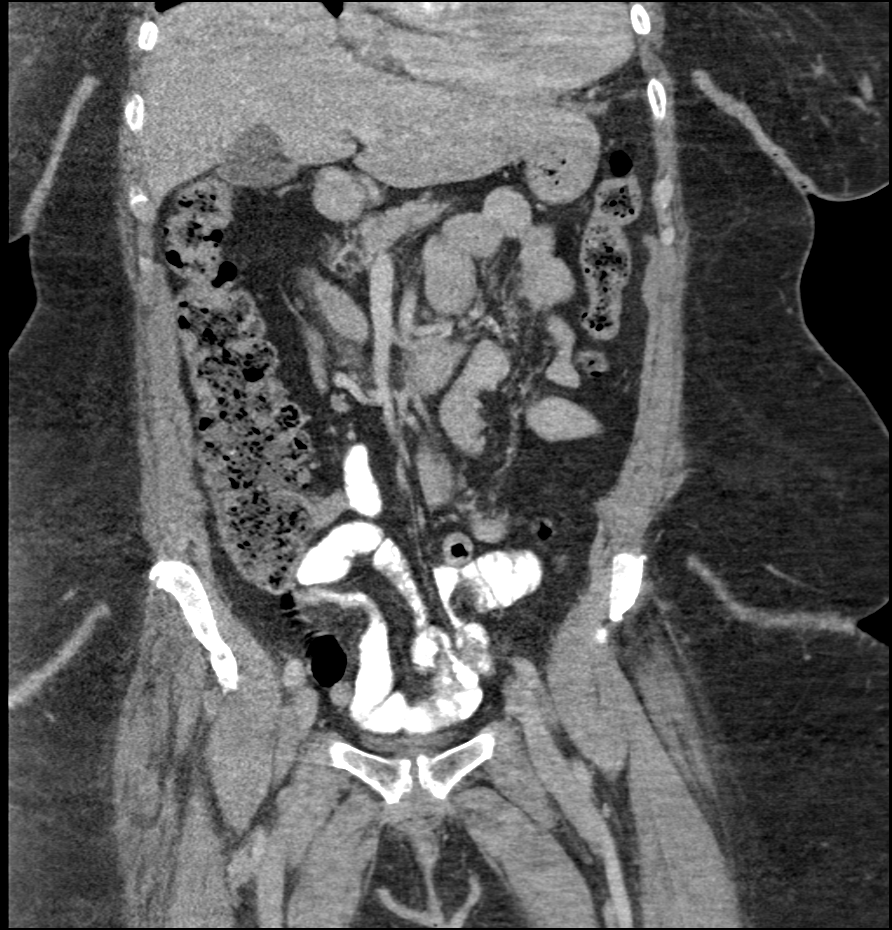
[im 86/155  soft-tissue]
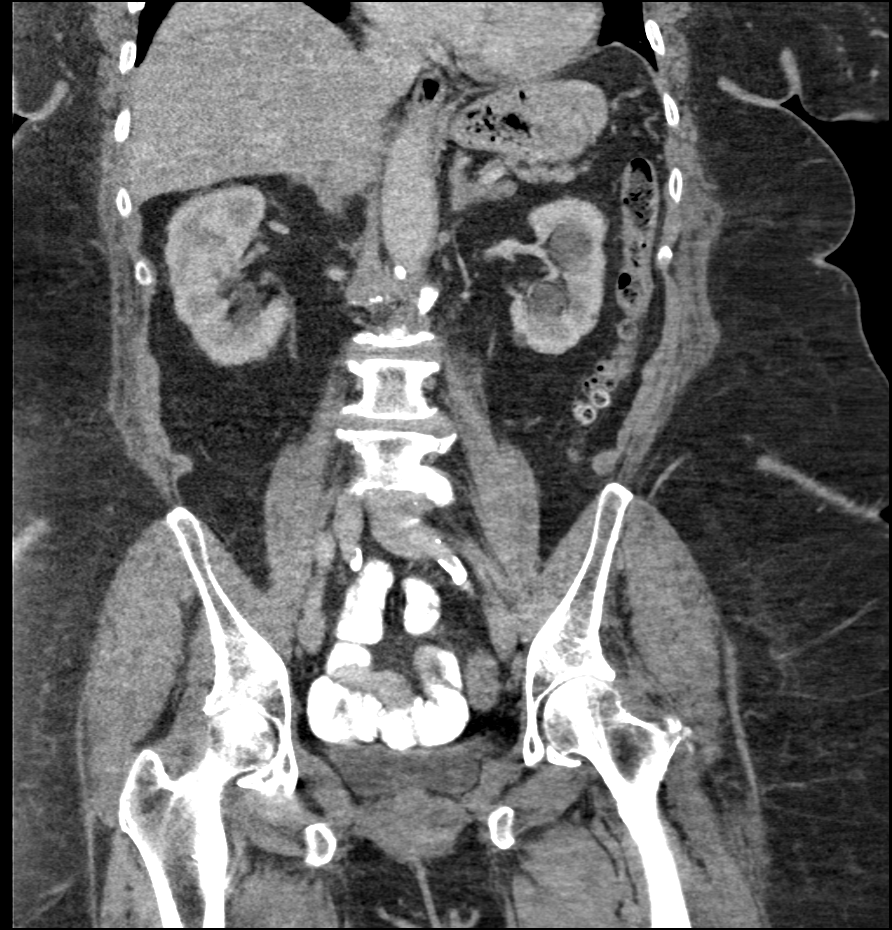

[11 of 46 positions shown; findings below may reference images not displayed]

FINDINGS: Lower chest: Lung bases are unremarkable in appearance. Heart is
mildly enlarged.

Upper abdomen: No focal abnormality identified within the visualized
portions of the liver, spleen, pancreas, or adrenal glands. Numerous
bilateral renal cysts are present. No hydronephrosis. The courses of
the ureters are unremarkable. Gallbladder is present.

Gastrointestinal tract: The stomach and small bowel loops are normal
in appearance. The appendix is well seen and has a normal
appearance. There are numerous descending and sigmoid colonic
diverticula but no acute diverticulitis.

Pelvis: Uterus is absent.  No adnexal mass.  No free pelvic fluid.

Retroperitoneum: Within the left retroperitoneum, adjacent to the
left ovarian vein, there is a small lobulated mass best seen on
image 44 of series 201 and measuring 2.3 x 1.3 cm. This may
represent small lymph lymph node. This appears discrete from the
ureter and is probably stable since 4544. No other significant
retroperitoneal adenopathy identified.

Abdominal wall: There are numerous small para umbilical and
supraumbilical fat containing hernias. Anterior abdominal wall
surgical change.

Osseous structures: Significant degenerative changes throughout the
lower thoracic and lumbar spine. No suspicious lytic or blastic
lesions are identified.
IMPRESSION: 1. Diverticulosis without acute diverticulitis.
2. Status post hysterectomy.  No adnexal mass.
3. Small retroperitoneal lymph node appears stable since prior
studies and may represent a reactive lymph node from previous pelvic
abscess. Given the stability, no further evaluation is felt to be
necessary.
4. Numerous small fat containing paraumbilical and supraumbilical
hernias.
5. Numerous bilateral renal cysts.

## 2015-11-27 MED ORDER — PNEUMOCOCCAL VAC POLYVALENT 25 MCG/0.5ML IJ INJ
0.5000 mL | INJECTION | INTRAMUSCULAR | Status: AC
  Administered 2015-11-29: 0.5 mL via INTRAMUSCULAR
  Filled 2015-11-27: qty 0.5

## 2015-11-27 NOTE — Progress Notes (Signed)
Orthopedic Tech Progress Note Patient Details:  DELAYNA UPDYKE 1945/05/27 [id number]  Patient ID: [name], female   DOB: [birth year], 70 y.o.   MRN: [id number] Pt refused cpm  Karolee Stamps 11/27/2015, 5:30 AM

## 2015-11-27 NOTE — Progress Notes (Signed)
Subjective: 1 Day Post-Op Procedure(s) (LRB): TOTAL KNEE ARTHROPLASTY (Right) Patient reports pain as moderate.    Objective: Vital signs in last 24 hours: Temp:  [97.2 F (36.2 C)-98.5 F (36.9 C)] 98.5 F (36.9 C) (10/17 0620) Pulse Rate:  [75-108] 108 (10/17 0620) Resp:  [12-23] 17 (10/17 0620) BP: (129-192)/(57-90) 134/75 (10/17 0620) SpO2:  [95 %-100 %] 100 % (10/17 0620) Weight:  [123.4 kg (272 lb)] 123.4 kg (272 lb) (10/16 1027)  Intake/Output from previous day: 10/16 0701 - 10/17 0700 In: [id number] [P.O.:240; I.V.:1000; IV Piggyback:250] Out: [id number] [Urine:1600; Blood:50] Intake/Output this shift: No intake/output data recorded.  No results for input(s): HGB in the last 72 hours. No results for input(s): WBC, RBC, HCT, PLT in the last 72 hours. No results for input(s): NA, K, CL, CO2, BUN, CREATININE, GLUCOSE, CALCIUM in the last 72 hours. No results for input(s): LABPT, INR in the last 72 hours.  Neurologically intact  Assessment/Plan: 1 Day Post-Op Procedure(s) (LRB): TOTAL KNEE ARTHROPLASTY (Right) Up with therapy   Possible home vs SNF   Teresa Stanley 11/27/2015, 8:00 AM

## 2015-11-27 NOTE — Progress Notes (Signed)
Physical Therapy Treatment Patient Details Name: Teresa Stanley MRN: [id number] DOB: 1945/11/12 Today's Date: 11/27/2015    History of Present Illness Patient is a 70 y/o female s/p Rt TKA. PMH of left TKA, HTN, HLD, DM.    PT Comments    Patient progressing slowly with mobility. Tolerated ambulating 3 feet to get back to bed with Mod A for weight shifting and to stabilize RLE due to knee instability. Pt fearful and anxious of falling. Increased time to perform all mobility. Pain continues to limit mobility. Concerned about pt going home. Continues to recommend SNF. Will follow.   Follow Up Recommendations  SNF     Equipment Recommendations  None recommended by PT    Recommendations for Other Services       Precautions / Restrictions Precautions Precautions: Knee Precaution Booklet Issued: No Precaution Comments: Reviewed no pillow under knee and precautions. Required Braces or Orthoses: Knee Immobilizer - Right Knee Immobilizer - Right: On at all times (except with PT/CPM) Restrictions Weight Bearing Restrictions: Yes RLE Weight Bearing: Weight bearing as tolerated    Mobility  Bed Mobility Overal bed mobility: Needs Assistance Bed Mobility: Sit to Supine     Supine to sit: Mod assist     General bed mobility comments: Assist to bring BLEs into bed. Increased time to position hips. DOE.  Transfers Overall transfer level: Needs assistance Equipment used: Rolling walker (2 wheeled) Transfers: Sit to/from Stand Sit to Stand: Min assist         General transfer comment: Assist to boost to standing with increased time and cues. Pt anxious about falling.   Ambulation/Gait Ambulation/Gait assistance: Min assist Ambulation Distance (Feet): 3 Feet Assistive device: Rolling walker (2 wheeled) Gait Pattern/deviations: Step-to pattern;Decreased stance time - right;Decreased step length - left;Trunk flexed Gait velocity: decreased   General Gait Details: Pt requires  assist with weighshifting and to stabilize RLE during progression of LLE due to instability. Increased time to take a step. Pt leaning on forearms for support despite cues. HR up to 140 bpm. Sp02 drops to 80s on RA during exertion.    Stairs            Wheelchair Mobility    Modified Rankin (Stroke Patients Only)       Balance Overall balance assessment: Needs assistance Sitting-balance support: Feet supported;No upper extremity supported Sitting balance-Leahy Scale: Fair     Standing balance support: During functional activity Standing balance-Leahy Scale: Poor Standing balance comment: Reliant on BUEs for support.                     Cognition Arousal/Alertness: Awake/alert Behavior During Therapy: WFL for tasks assessed/performed Overall Cognitive Status: Within Functional Limits for tasks assessed                      Exercises Total Joint Exercises Heel Slides: Right;Seated;5 reps Knee Flexion: PROM;Right (Slowly lowering leg rest on chair to increase knee flexion AROM with 20 sec holds at each increment.) Goniometric ROM: 40 degrees knee flexion AROM    General Comments        Pertinent Vitals/Pain Pain Assessment: Faces Faces Pain Scale: Hurts whole lot Pain Location: right knee Pain Descriptors / Indicators: Sore;Operative site guarding;Guarding;Grimacing Pain Intervention(s): Monitored during session;Repositioned;Premedicated before session;Limited activity within patient's tolerance    Home Living                      Prior Function  PT Goals (current goals can now be found in the care plan section) Progress towards PT goals: Progressing toward goals (slowly)    Frequency    7X/week      PT Plan Current plan remains appropriate    Co-evaluation             End of Session Equipment Utilized During Treatment: Gait belt Activity Tolerance: Patient limited by pain Patient left: in bed;with call  bell/phone within reach;with bed alarm set     Time: 1340-1412 PT Time Calculation (min) (ACUTE ONLY): 32 min  Charges:  $Gait Training: 8-22 mins $Therapeutic Activity: 8-22 mins                    G Codes:      Isahi Godwin A Gabriella Woodhead 11/27/2015, 2:54 PM Wray Kearns, [place], DPT [phone]

## 2015-11-27 NOTE — Evaluation (Signed)
Physical Therapy Evaluation Patient Details Name: Teresa Stanley MRN: [id number] DOB: [birth year] Today's Date: 11/27/2015   History of Present Illness  Patient is a 70 y/o female s/p Rt TKA. PMH of left TKA, HTN, HLD, DM.  Clinical Impression  Patient presents with pain and post surgical deficits s/p above surgery. Education re: knee precautions, exercises and WB status. Increased time to perform all mobility and difficulty standing from low surfaces. Tolerated SPT x2 with Min A for balance and to boost up. Pt really wants to return home if able but may need short term SNF to maximize independence and mobility prior to return home. Will follow acutely.     Follow Up Recommendations SNF    Equipment Recommendations  None recommended by PT    Recommendations for Other Services OT consult     Precautions / Restrictions Precautions Precautions: Knee Precaution Booklet Issued: No Precaution Comments: Reviewed no pillow under knee and precautions. Required Braces or Orthoses: Knee Immobilizer - Right Knee Immobilizer - Right: On at all times Restrictions Weight Bearing Restrictions: Yes RLE Weight Bearing: Weight bearing as tolerated      Mobility  Bed Mobility Overal bed mobility: Needs Assistance Bed Mobility: Supine to Sit     Supine to sit: Min assist;HOB elevated     General bed mobility comments: Assist to bring RLE to EOB, with increased difficulty and time. Use of rails for support. Cues for technique.   Transfers Overall transfer level: Needs assistance Equipment used: Rolling walker (2 wheeled) Transfers: Sit to/from [facility] Sit to Stand: Min assist;From elevated surface Stand pivot transfers: Min assist       General transfer comment: Unable to stand from standard height, able to stand with cues for momentum, technique and assist. Able to take a few steps to get to [facility] and then to the chair.  Ambulation/Gait Ambulation/Gait assistance:  Min assist Ambulation Distance (Feet): 2 Feet Assistive device: Rolling walker (2 wheeled) Gait Pattern/deviations: Step-to pattern;Trunk flexed;Wide base of support     General Gait Details: Able to take a few steps to [facility] and to chair with Min A for balance.   Stairs            Wheelchair Mobility    Modified Rankin (Stroke Patients Only)       Balance Overall balance assessment: Needs assistance Sitting-balance support: Feet supported;No upper extremity supported Sitting balance-Leahy Scale: Fair     Standing balance support: During functional activity;Bilateral upper extremity supported Standing balance-Leahy Scale: Poor Standing balance comment: Reliant on BUEs for support in standing.                              Pertinent Vitals/Pain Pain Assessment: 0-10 Pain Score: 8  Pain Location: right knee Pain Descriptors / Indicators: Sore;Operative site guarding;Grimacing;Guarding Pain Intervention(s): Monitored during session;Repositioned;Limited activity within patient's tolerance;Patient requesting pain meds-RN notified    Home Living Family/patient expects to be discharged to:: Private residence Living Arrangements: Spouse/significant other;Children Available Help at Discharge: Available 24 hours/day;Family Type of Home: House Home Access: [place]: [place]: Environmental consultant - 2 wheels;Cane - single point;Bedside commode Additional Comments: Lives with spouse and has younger son that can help as needed (has to check with him).    Prior Function Level of Independence: Independent         Comments: Does cooking, cleaning, drives.      Hand  Dominance   Dominant Hand: Right    Extremity/Trunk Assessment   Upper Extremity Assessment: Defer to OT evaluation           Lower Extremity Assessment: RLE deficits/detail RLE Deficits / Details: Limited AROM/strength secondary to pain and surgery.        Communication   Communication: No difficulties  Cognition Arousal/Alertness: Awake/alert Behavior During Therapy: WFL for tasks assessed/performed Overall Cognitive Status: Within Functional Limits for tasks assessed                      General Comments      Exercises Total Joint Exercises Ankle Circles/Pumps: Both;10 reps;Supine Quad Sets: Both;10 reps;Supine Gluteal Sets: Both;10 reps;Supine   Assessment/Plan    PT Assessment Patient needs continued PT services  PT Problem List Decreased strength;Decreased mobility;Decreased range of motion;Decreased activity tolerance;Cardiopulmonary status limiting activity;Pain;Decreased balance          PT Treatment Interventions DME instruction;Therapeutic activities;Gait training;Patient/family education;Therapeutic exercise;Balance training;Neuromuscular re-education;Functional mobility training    PT Goals (Current goals can be found in the Care Plan section)  Acute Rehab PT Goals Patient Stated Goal: to go home if able and get back to life. PT Goal Formulation: With patient Time For Goal Achievement: 12/11/15 Potential to Achieve Goals: Fair    Frequency 7X/week   Barriers to discharge        Co-evaluation               End of Session Equipment Utilized During Treatment: Gait belt Activity Tolerance: Patient limited by pain Patient left: in chair;with call bell/phone within reach;with nursing/sitter in room Nurse Communication: Mobility status         Time: 0944 ([phone] PT Time Calculation (min) (ACUTE ONLY): 23 min   Charges:   PT Evaluation $PT Eval Low Complexity: 1 Procedure PT Treatments $Therapeutic Activity: 8-22 mins   PT G Codes:        Reshma Hoey A Selim Durden 11/27/2015, 10:39 AM Wray Kearns, PT, DPT [phone]

## 2015-11-27 NOTE — Progress Notes (Signed)
Orthopedic Tech Progress Note Patient Details:  Teresa Stanley [birth year] [id number] Patient ID: [name], female   DOB: October 02, 1945, 70 y.o.   MRN: [id number] Ortho visit put on cpm at 1845  Braulio Bosch 11/27/2015, 6:43 PM

## 2015-11-27 NOTE — Evaluation (Addendum)
Occupational Therapy Evaluation Patient Details Name: Teresa Stanley MRN: [id number] DOB: 07-Jul-1945 Today's Date: 11/27/2015    History of Present Illness Patient is a 70 y/o female s/p Rt TKA. PMH of left TKA, HTN, HLD, DM.   Clinical Impression   PTA, pt was independent with ADL and IADL. Pt lives with husband and son who is able to provide intermittent assistance. Pt currently requires mod assist for ADL transfers and LB ADL. Upon OT entry for evaluation, pt with SpO2 84% on room air. Reapplied 1.5 L O2 and SpO2 returned to 100% before activity. SpO2 92% on room air during ADL and 85% after returning to bed. Reapplied 1.5 L O2 with SpO2 returning to 96% post-activity.  Mod assist required for functional ambulation to [facility] approximately 3 feet away from bed. Pt would benefit from further OT services to improve independence with ADL and functional mobility prior to D/C. Recommend short term SNF placement for continued rehabilitation prior to going home as pt will not have 24 hour assistance available at home. OT will continue to follow acutely.    Follow Up Recommendations  SNF;Supervision/Assistance - 24 hour    Equipment Recommendations  Other (comment) (Defer to next venue of care.)       Precautions / Restrictions Precautions Precautions: Knee Precaution Booklet Issued: No Precaution Comments: Reviewed no pillow under knee and precautions during ADL. Required Braces or Orthoses: Knee Immobilizer - Right Knee Immobilizer - Right: On at all times;Other (comment) (Except with PT/CPM) Restrictions Weight Bearing Restrictions: Yes RLE Weight Bearing: Weight bearing as tolerated      Mobility Bed Mobility Overal bed mobility: Needs Assistance Bed Mobility: Supine to Sit;Sit to Supine     Supine to sit: Mod assist;HOB elevated Sit to supine: Mod assist;HOB elevated   General bed mobility comments: Assist to bring R LE into bed and scoot up in bed. VC's for  technique.  Transfers Overall transfer level: Needs assistance Equipment used: Rolling walker (2 wheeled) Transfers: Sit to/from Stand Sit to Stand: From elevated surface;Mod assist Stand pivot transfers: Mod assist;From elevated surface       General transfer comment: VC's to maintain upright standing position with RW.    Balance Overall balance assessment: Needs assistance Sitting-balance support: Feet supported;No upper extremity supported Sitting balance-Leahy Scale: Fair     Standing balance support: During functional activity;Bilateral upper extremity supported Standing balance-Leahy Scale: Poor Standing balance comment: Reliant on BUEs for support.                             ADL Overall ADL's : Needs assistance/impaired     Grooming: Set up;Sitting   Upper Body Bathing: Minimal assitance;Sitting Upper Body Bathing Details (indicate cue type and reason): Min assist to maintain dynamic sitting balance. Lower Body Bathing: Moderate assistance;Sit to/from stand   Upper Body Dressing : Minimal assistance;Sitting Upper Body Dressing Details (indicate cue type and reason): Min assist to maintain balance. Lower Body Dressing: Moderate assistance;Sit to/from stand   Toilet Transfer: Moderate assistance;Stand-pivot;Ambulation;BSC   Toileting- Clothing Manipulation and Hygiene: Supervision/safety;Sitting/lateral lean       Functional mobility during ADLs: Moderate assistance;Rolling walker General ADL Comments: Educated pt on knee precautions during ADL, no pillow under knee, use of ice for pain management, safe toilet transfers. Discussed potential for short-term SNF placement for rehabilitation and pt agreeable although would prefer to go home.     Vision Vision Assessment?: No apparent visual deficits  Pertinent Vitals/Pain Pain Assessment: 0-10 Pain Score: 7  Faces Pain Scale: Hurts whole lot Pain Location: R knee Pain Descriptors /  Indicators: Sore;Operative site guarding;Grimacing;Guarding Pain Intervention(s): Limited activity within patient's tolerance;Monitored during session;Ice applied;Repositioned     Hand Dominance Right   Extremity/Trunk Assessment Upper Extremity Assessment Upper Extremity Assessment: Generalized weakness   Lower Extremity Assessment Lower Extremity Assessment: RLE deficits/detail RLE Deficits / Details: Decreased ROM and strength post-operatively.       Communication Communication Communication: No difficulties   Cognition Arousal/Alertness: Awake/alert Behavior During Therapy: WFL for tasks assessed/performed Overall Cognitive Status: Within Functional Limits for tasks assessed                                Home Living Family/patient expects to be discharged to:: Private residence Living Arrangements: Spouse/significant other;Children Available Help at Discharge: Available 24 hours/day;Family Type of Home: House Home Access: Ramped entrance     [place]: One level     Bathroom Shower/Tub: [place] unit Shower/tub characteristics: Architectural technologist: Standard Bathroom Accessibility: Yes How Accessible: Accessible via walker Home Equipment: [place] - 2 wheels;Kasandra Knudsen - single point   Additional Comments: Lives with spouse and has younger son that can help as needed but not available 24 hours/day due to work schedule.      Prior Functioning/Environment Level of Independence: Independent                 OT Problem List: Decreased strength;Decreased range of motion;Decreased activity tolerance;Impaired balance (sitting and/or standing);Impaired UE functional use;Decreased knowledge of use of DME or AE;Decreased safety awareness;Decreased knowledge of precautions;Pain   OT Treatment/Interventions: Self-care/ADL training;Therapeutic exercise;Neuromuscular education;DME and/or AE instruction;Energy conservation;Patient/family education;Balance  training    OT Goals(Current goals can be found in the care plan section) Acute Rehab OT Goals Patient Stated Goal: to go home if able and get back to life. OT Goal Formulation: With patient Time For Goal Achievement: 12/11/15 Potential to Achieve Goals: Good ADL Goals Pt Will Perform Lower Body Bathing: sit to/from stand;with supervision Pt Will Perform Lower Body Dressing: with supervision;sit to/from stand Pt Will Transfer to Toilet: with min guard assist;ambulating;bedside commode Pt Will Perform Toileting - Clothing Manipulation and hygiene: with supervision;sit to/from stand Pt Will Perform Tub/Shower Transfer: with min assist;rolling walker;ambulating;3 in 1 Pt/caregiver will Perform Home Exercise Program: Increased strength;Both right and left upper extremity;With theraband;With written HEP provided;Independently  OT Frequency: Min 1X/week    End of Session Equipment Utilized During Treatment: Gait belt;Rolling walker;Oxygen CPM Right Knee CPM Right Knee: Off Additional Comments: Trapeze bar and foot roll Nurse Communication: Other (comment) (SpO2 levels)  Activity Tolerance: Patient tolerated treatment well Patient left: in bed;with call bell/phone within reach;with family/visitor present   Time: 1440-1525 OT Time Calculation (min): 45 min Charges:  OT General Charges $OT Visit: 1 Procedure OT Evaluation $OT Eval Moderate Complexity: 1 Procedure OT Treatments $Self Care/Home Management : 23-37 mins  Norman Herrlich, OTR/L [id number] 11/27/2015, 3:45 PM

## 2015-11-27 NOTE — Progress Notes (Signed)
Subjective: Doing well.  Pain controlled. States that she had previously went to skilled rehab after left TKR.     Objective: Vital signs in last 24 hours: Temp:  [97.2 F (36.2 C)-98.3 F (36.8 C)] 98.3 F (36.8 C) (10/17 0218) Pulse Rate:  [75-90] 90 (10/17 0218) Resp:  [12-23] 16 (10/17 0218) BP: (129-192)/(57-90) 129/57 (10/17 0218) SpO2:  [95 %-100 %] 95 % (10/17 0218) Weight:  [123.4 kg (272 lb)] 123.4 kg (272 lb) (10/16 1027)  Intake/Output from previous day: 10/16 0701 - 10/17 0700 In: [id number] [P.O.:240; I.V.:1000; IV Piggyback:250] Out: [id number] [Urine:1600; Blood:50] Intake/Output this shift: No intake/output data recorded.  No results for input(s): HGB in the last 72 hours. No results for input(s): WBC, RBC, HCT, PLT in the last 72 hours. No results for input(s): NA, K, CL, CO2, BUN, CREATININE, GLUCOSE, CALCIUM in the last 72 hours. No results for input(s): LABPT, INR in the last 72 hours.  Exam:  Alert and oriented.  Dressing C/D/I. bilat calves nontender.    Assessment/Plan: Start PT today. Will see how she does.  May need short SNF placement for rehab.  D/c dilaudid.     Dominick Zertuche M 11/27/2015, 7:30 AM

## 2015-11-27 NOTE — Anesthesia Postprocedure Evaluation (Signed)
Anesthesia Post Note  Patient: BELLAANN RODER  Procedure(s) Performed: Procedure(s) (LRB): TOTAL KNEE ARTHROPLASTY (Right)  Patient location during evaluation: PACU Anesthesia Type: General and Regional Level of consciousness: awake and alert Pain management: pain level controlled Vital Signs Assessment: post-procedure vital signs reviewed and stable Respiratory status: spontaneous breathing, nonlabored ventilation, respiratory function stable and patient connected to nasal cannula oxygen Cardiovascular status: blood pressure returned to baseline and stable Postop Assessment: no signs of nausea or vomiting Anesthetic complications: no     Last Vitals:  Vitals:   11/26/15 2231 11/27/15 0218  BP: 139/65 (!) 129/57  Pulse:  90  Resp: 16 16  Temp: 36.3 C 36.8 C    Last Pain:  Vitals:   11/27/15 0450  TempSrc:   PainSc: Asleep   Pain Goal: Patients Stated Pain Goal: 3 (11/26/15 1053)               Catalina Gravel

## 2015-11-27 NOTE — Clinical Social Work Note (Signed)
Clinical Social Work Assessment  Patient Details  Name: Teresa Stanley MRN: [id number] Date of Birth: 05/15/1945  Date of referral:  11/27/15               Reason for consult:  Facility Placement                Permission sought to share information with:   (Facilities.) Permission granted to share information::   (None. Patient states that she would like to work with PT before she decides is she would like a facility. )  Name::        Agency::     Relationship::     Contact Information:     Housing/Transportation Living arrangements for the past 2 months:  Single Family Home (Patient states that she lives at home in [place] with her husband. ) Source of Information:  Patient Patient Interpreter Needed:  None Criminal Activity/Legal Involvement Pertinent to Current Situation/Hospitalization:  No - Comment as needed Significant Relationships:  Adult Children, Spouse (Patient states that she has 4 children who are supportive. She states her son Dorthey Sawyer is her POA [phone]) Lives with:  Spouse Do you feel safe going back to the place where you live?  Yes Need for family participation in patient care:  Yes (Comment)  Care giving concerns:  Patient states that prior to having her R knee replaced she was able to complete her ADLs independently. However, she states that now due to surgery she will need assistance with ADLs.   Social Worker assessment / plan:  SW met with patient at bedside. Patient was alert and oriented. There was no family present. Patient states that she has a R knee surgery done yesterday. Patient says she has had a L knee replacement in the past. Patient states that she has a good support system which consist of her husband and children.  Employment status:  Retired Forensic scientist:   Production designer, theatre/television/film) PT Recommendations:  Not assessed at this time Information / Referral to community resources:   (Patient states that she is not sure if she  wants SNF at this time. Patient states that she is interested in home health and wants to work with PT.)  Patient/Family's Response to care:  Patient was cooperative and friendly. She states that she wants to work with PT at this time before she decides if she would like to be referred to a SNF.   Patient/Family's Understanding of and Emotional Response to Diagnosis, Current Treatment, and Prognosis:  Patient states that she has no questions for SW.   Emotional Assessment Appearance:  Appears stated age Attitude/Demeanor/Rapport:    Affect (typically observed):  Accepting, Appropriate Orientation:  Oriented to Self, Oriented to Place, Oriented to  Time, Oriented to Situation Alcohol / Substance use:  Not Applicable Psych involvement (Current and /or in the community):  No (Comment)  Discharge Needs  Concerns to be addressed:  No discharge needs identified Readmission within the last 30 days:  No Current discharge risk:  None Barriers to Discharge:  No Barriers Identified   Venetia Maxon, Curley Fayette R 11/27/2015, 1:17 PM
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
## 2015-11-28 ENCOUNTER — Encounter (HOSPITAL_COMMUNITY): Payer: Self-pay

## 2015-11-28 LAB — CBC
HEMATOCRIT: 30.7 % — AB (ref 36.0–46.0)
HEMOGLOBIN: 9.5 g/dL — AB (ref 12.0–15.0)
MCH: 29 pg (ref 26.0–34.0)
MCHC: 30.9 g/dL (ref 30.0–36.0)
MCV: 93.6 fL (ref 78.0–100.0)
Platelets: 214 10*3/uL (ref 150–400)
RBC: 3.28 MIL/uL — ABNORMAL LOW (ref 3.87–5.11)
RDW: 15 % (ref 11.5–15.5)
WBC: 13.3 10*3/uL — AB (ref 4.0–10.5)

## 2015-11-28 LAB — GLUCOSE, CAPILLARY
GLUCOSE-CAPILLARY: 81 mg/dL (ref 65–99)
GLUCOSE-CAPILLARY: 119 mg/dL — AB (ref 65–99)
GLUCOSE-CAPILLARY: 158 mg/dL — AB (ref 65–99)
Glucose-Capillary: 128 mg/dL — ABNORMAL HIGH (ref 65–99)

## 2015-11-28 NOTE — Progress Notes (Signed)
Physical Therapy Treatment Patient Details Name: STEPHONIE SAMADI MRN: [id number] DOB: [birth year] Today's Date: 11/28/2015    History of Present Illness Patient is a 70 y/o female s/p Rt TKA. PMH of left TKA, HTN, HLD, DM.    PT Comments    Patient progressing very slowly towards PT goals. Requires Mod A for bed mobility and standing transfers. Increased effort and difficulty with standing from elevated surfaces. Continues to be fearful of falling. Instructed pt in exercises. Home is not a safe option due to lack of progress with mobility and safety concerns. Continue to recommend SNF. Will follow.   Follow Up Recommendations  SNF     Equipment Recommendations  None recommended by PT    Recommendations for Other Services       Precautions / Restrictions Precautions Precautions: Knee Precaution Booklet Issued: No Precaution Comments: Reviewed no pillow under knee and precautions. Required Braces or Orthoses: Knee Immobilizer - Right Knee Immobilizer - Right: On at all times;Other (comment) Restrictions Weight Bearing Restrictions: Yes RLE Weight Bearing: Weight bearing as tolerated    Mobility  Bed Mobility Overal bed mobility: Needs Assistance Bed Mobility: Supine to Sit     Supine to sit: Mod assist;HOB elevated     General bed mobility comments: Assist with RLE to EOB and to scoot bottom. Cues for technique and increased time. Use of rail.  Transfers Overall transfer level: Needs assistance Equipment used: Rolling walker (2 wheeled) Transfers: Sit to/from Stand Sit to Stand: Mod assist;From elevated surface         General transfer comment: Cues for technique, multiple attempts to stand from standard bed height but pt unable despite cues for momentum and anterior weight shift. Able to stand with mod A and increased time/effort.  Ambulation/Gait Ambulation/Gait assistance: Min assist Ambulation Distance (Feet): 3 Feet Assistive device: Rolling walker (2  wheeled) Gait Pattern/deviations: Step-to pattern;Trunk flexed Gait velocity: decreased   General Gait Details: Able to take a few steps to get to chair, trying to sit prematurely. Difficulty advancing LLE due to pain. Leaning on walker for support, increased effort.    Stairs            Wheelchair Mobility    Modified Rankin (Stroke Patients Only)       Balance Overall balance assessment: Needs assistance Sitting-balance support: Feet supported;Single extremity supported Sitting balance-Leahy Scale: Fair     Standing balance support: During functional activity;Bilateral upper extremity supported Standing balance-Leahy Scale: Poor Standing balance comment: Reliant on BUEs for support.                     Cognition Arousal/Alertness: Awake/alert Behavior During Therapy: WFL for tasks assessed/performed Overall Cognitive Status: Within Functional Limits for tasks assessed                      Exercises Total Joint Exercises Ankle Circles/Pumps: Both;10 reps;Supine Quad Sets: Both;10 reps;Supine Heel Slides: Right;5 reps;Seated Goniometric ROM: 5-55 degrees knee AROM    General Comments        Pertinent Vitals/Pain Pain Assessment: Faces Faces Pain Scale: Hurts even more Pain Location: Right knee Pain Descriptors / Indicators: Sore;Operative site guarding;Guarding;Grimacing Pain Intervention(s): Monitored during session;Repositioned;Limited activity within patient's tolerance;Premedicated before session    Home Living                      Prior Function            PT Goals (  current goals can now be found in the care plan section) Progress towards PT goals: Progressing toward goals (very slowly)    Frequency    7X/week      PT Plan Current plan remains appropriate    Co-evaluation             End of Session Equipment Utilized During Treatment: Gait belt Activity Tolerance: Patient limited by pain Patient left: in  chair;with call bell/phone within reach     Time: 1017-1040 PT Time Calculation (min) (ACUTE ONLY): 23 min  Charges:  $Therapeutic Activity: 23-37 mins                    G Codes:      Clyde Zarrella A Kaliah Haddaway 11/28/2015, 10:49 AM  Wray Kearns, PT, DPT [phone]

## 2015-11-28 NOTE — NC FL2 (Signed)
[place] LEVEL OF CARE SCREENING TOOL     IDENTIFICATION  Patient Name: Teresa Stanley Birthdate: 1945/03/10 Sex: female Admission Date (Current Location): 11/26/2015  [facility] [place] Number:  Herbalist and Address:  [facility], [place] [address] [place], [place] [address]      Provider Number: [id number]  Attending Physician Name and Address:  Marybelle Killings, MD  Relative Name and Phone Number:       Current Level of Care: Hospital Recommended Level of Care: [facility] Prior Approval Number:    Date Approved/Denied:   PASRR Number:    Discharge Plan: SNF    Current Diagnoses: Patient Active Problem List   Diagnosis Date Noted  . Right knee DJD 11/26/2015  . S/P total knee arthroplasty 04/18/2013  . Osteoarthritis of left knee 04/04/2013    Class: Diagnosis of  . Candidal intertrigo 08/09/2012  . Hypertension 12/15/2011  . Diabetes mellitus ([place]) 12/15/2011    Orientation RESPIRATION BLADDER Height & Weight     Self, Time, Situation, Place  O2 Continent Weight: 272 lb (123.4 kg) Height:     BEHAVIORAL SYMPTOMS/MOOD NEUROLOGICAL BOWEL NUTRITION STATUS      Continent    AMBULATORY STATUS COMMUNICATION OF NEEDS Skin   Extensive Assist Verbally Surgical wounds                       Personal Care Assistance Level of Assistance  Bathing, Feeding, Dressing Bathing Assistance: Limited assistance Feeding assistance: Limited assistance Dressing Assistance: Limited assistance     Functional Limitations Info             SPECIAL CARE FACTORS FREQUENCY  PT (By licensed PT), OT (By licensed OT)                    Contractures Contractures Info: Not present    Additional Factors Info  Code Status Code Status Info: FULL CODE             Current Medications (11/28/2015):  This is the current hospital active medication list Current Facility-Administered Medications  Medication  Dose Route Frequency Provider Last Rate Last Dose  . 0.9 %  sodium chloride infusion   Intravenous Continuous Lanae Crumbly, PA-C 90 mL/hr at 11/26/15 1853    . acetaminophen (TYLENOL) tablet 650 mg  650 mg Oral Q6H PRN Lanae Crumbly, PA-C       Or  . acetaminophen (TYLENOL) suppository 650 mg  650 mg Rectal Q6H PRN Lanae Crumbly, PA-C      . amLODipine (NORVASC) tablet 10 mg  10 mg Oral Daily Lanae Crumbly, PA-C   10 mg at 11/28/15 [id number]  . aspirin EC tablet 325 mg  325 mg Oral Q breakfast Lanae Crumbly, PA-C   325 mg at 11/28/15 [id number]  . atorvastatin (LIPITOR) tablet 20 mg  20 mg Oral Daily Lanae Crumbly, PA-C   20 mg at 11/28/15 [id number]  . docusate sodium (COLACE) capsule 100 mg  100 mg Oral BID Lanae Crumbly, PA-C   100 mg at 11/28/15 [id number]  . insulin aspart (novoLOG) injection 0-20 Units  0-20 Units Subcutaneous TID WC Lanae Crumbly, PA-C   3 Units at 11/28/15 [phone]  . lactated ringers infusion   Intravenous Continuous Catalina Gravel, MD 50 mL/hr at 11/26/15 1117    . lisinopril (PRINIVIL,ZESTRIL) tablet 20 mg  20 mg Oral BID  Lanae Crumbly, PA-C   20 mg at 11/28/15 [id number]  . menthol-cetylpyridinium (CEPACOL) lozenge 3 mg  1 lozenge Oral PRN Lanae Crumbly, PA-C       Or  . phenol (CHLORASEPTIC) mouth spray 1 spray  1 spray Mouth/Throat PRN Lanae Crumbly, PA-C      . metFORMIN (GLUCOPHAGE) tablet 1,000 mg  1,000 mg Oral BID WC Lanae Crumbly, PA-C   1,000 mg at 11/27/15 1715  . methocarbamol (ROBAXIN) tablet 500 mg  500 mg Oral Q6H PRN Lanae Crumbly, PA-C   500 mg at 11/28/15 [id number]   Or  . methocarbamol (ROBAXIN) 500 mg in dextrose 5 % 50 mL IVPB  500 mg Intravenous Q6H PRN Lanae Crumbly, PA-C      . metoCLOPramide (REGLAN) tablet 5-10 mg  5-10 mg Oral Q8H PRN Lanae Crumbly, PA-C       Or  . metoCLOPramide (REGLAN) injection 5-10 mg  5-10 mg Intravenous Q8H PRN Lanae Crumbly, PA-C      . ondansetron [facility]) tablet 4 mg  4 mg Oral Q6H PRN Lanae Crumbly, PA-C       Or  . ondansetron [facility])  injection 4 mg  4 mg Intravenous Q6H PRN Lanae Crumbly, PA-C      . oxyCODONE (Oxy IR/ROXICODONE) immediate release tablet 5-10 mg  5-10 mg Oral Q4H PRN Lanae Crumbly, PA-C   10 mg at 11/28/15 0600  . pantoprazole (PROTONIX) EC tablet 40 mg  40 mg Oral QAC breakfast Lanae Crumbly, PA-C   40 mg at 11/28/15 [id number]  . pioglitazone (ACTOS) tablet 45 mg  45 mg Oral Daily Lanae Crumbly, PA-C   45 mg at 11/27/15 [id number]  . pneumococcal 23 valent vaccine (PNU-IMMUNE) injection 0.5 mL  0.5 mL Intramuscular Tomorrow-1000 Marybelle Killings, MD      . polyethylene glycol (MIRALAX / GLYCOLAX) packet 17 g  17 g Oral Daily PRN Lanae Crumbly, PA-C      . polyvinyl alcohol (LIQUIFILM TEARS) 1.4 % ophthalmic solution 1 drop  1 drop Both Eyes Daily Marybelle Killings, MD   1 drop at 11/27/15 0924  . propranolol ER (INDERAL LA) 24 hr capsule 120 mg  120 mg Oral QAC breakfast Lanae Crumbly, PA-C   120 mg at 11/27/15 [id number]  . triamterene-hydrochlorothiazide (MAXZIDE-25) 37.5-25 MG per tablet 1 tablet  1 tablet Oral QAC breakfast Lanae Crumbly, PA-C   1 tablet at 11/27/15 [id number]  . Vitamin D (Ergocalciferol) (DRISDOL) capsule 50,000 Units  50,000 Units Oral Once per day on Tue Fri Lanae Crumbly, PA-C   50,000 Units at 11/27/15 [id number]     Discharge Medications: Please see discharge summary for a list of discharge medications.  Relevant Imaging Results:  Relevant Lab Results:   Additional Information SS#: [id number]  Teresa Stanley, [place]

## 2015-11-28 NOTE — Progress Notes (Signed)
CSW met with pt re PT recommendation for SNF. Pt reports agreeable to SNF in order to increase strength and independence with mobility prior to return home. Pt with history at [facility] but prefers a different facility for this SNF stay. Will f/u with offers once available.   Wandra Feinstein, MSW, LCSW [phone] (coverage)
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
## 2015-11-28 NOTE — Progress Notes (Signed)
CSW provided current SNF offers to pt. Pt reports she plans to review offers with her son today and will notify CSW of choice. Anticipate d/c tomorrow.   Wandra Feinstein, MSW, LCSW [phone] (coverage)

## 2015-11-28 NOTE — Progress Notes (Signed)
   Subjective: 2 Days Post-Op Procedure(s) (LRB): TOTAL KNEE ARTHROPLASTY (Right) Patient reports pain as mild and moderate.    Objective: Vital signs in last 24 hours: Temp:  [98.2 F (36.8 C)-99.5 F (37.5 C)] 98.5 F (36.9 C) (10/18 0527) Pulse Rate:  [92-104] 104 (10/18 0527) Resp:  [16] 16 (10/18 0527) BP: (140-150)/(70-81) 144/74 (10/18 0527) SpO2:  [92 %-98 %] 98 % (10/18 0527)  Intake/Output from previous day: 10/17 0701 - 10/18 0700 In: 1980 [P.O.:1440; I.V.:540] Out: -  Intake/Output this shift: Total I/O In: 440 [P.O.:440] Out: 201 [Urine:201]   Recent Labs  11/27/15 0937 11/28/15 0642  HGB 10.0* 9.5*    Recent Labs  11/27/15 0937 11/28/15 0642  WBC 10.5 13.3*  RBC 3.35* 3.28*  HCT 31.6* 30.7*  PLT 234 214    Recent Labs  11/27/15 0937  NA 138  K 4.5  CL 103  CO2 26  BUN 24*  CREATININE 1.62*  GLUCOSE 189*  CALCIUM 8.2*   No results for input(s): LABPT, INR in the last 72 hours.  Neurologically intact  Assessment/Plan: 2 Days Post-Op Procedure(s) (LRB): TOTAL KNEE ARTHROPLASTY (Right) Up with therapy Discharge to SNF when bed available  Marybelle Killings 11/28/2015, 12:04 PM

## 2015-11-29 LAB — BASIC METABOLIC PANEL
ANION GAP: 9 (ref 5–15)
BUN: 26 mg/dL — ABNORMAL HIGH (ref 6–20)
CALCIUM: 8.3 mg/dL — AB (ref 8.9–10.3)
CO2: 23 mmol/L (ref 22–32)
CREATININE: 1.38 mg/dL — AB (ref 0.44–1.00)
Chloride: 104 mmol/L (ref 101–111)
GFR, EST AFRICAN AMERICAN: 44 mL/min — AB (ref >60)
GFR, EST NON AFRICAN AMERICAN: 38 mL/min — AB (ref >60)
Glucose, Bld: 155 mg/dL — ABNORMAL HIGH (ref 65–99)
Potassium: 4.6 mmol/L (ref 3.5–5.1)
SODIUM: 136 mmol/L (ref 135–145)

## 2015-11-29 LAB — CBC
HCT: 28.8 % — ABNORMAL LOW (ref 36.0–46.0)
Hemoglobin: 9 g/dL — ABNORMAL LOW (ref 12.0–15.0)
MCH: 29 pg (ref 26.0–34.0)
MCHC: 31.3 g/dL (ref 30.0–36.0)
MCV: 92.9 fL (ref 78.0–100.0)
PLATELETS: 199 10*3/uL (ref 150–400)
RBC: 3.1 MIL/uL — AB (ref 3.87–5.11)
RDW: 15.1 % (ref 11.5–15.5)
WBC: 12.7 10*3/uL — AB (ref 4.0–10.5)

## 2015-11-29 LAB — GLUCOSE, CAPILLARY
GLUCOSE-CAPILLARY: 141 mg/dL — AB (ref 65–99)
GLUCOSE-CAPILLARY: 186 mg/dL — AB (ref 65–99)

## 2015-11-29 MED ORDER — OXYCODONE-ACETAMINOPHEN 7.5-325 MG PO TABS: 1.0000 | ORAL_TABLET | ORAL | 0 refills | Status: DC | PRN

## 2015-11-29 MED ORDER — ASPIRIN EC 325 MG PO TBEC: 325.0000 mg | DELAYED_RELEASE_TABLET | Freq: Every day | ORAL | 0 refills | Status: DC

## 2015-11-29 MED ORDER — METHOCARBAMOL 500 MG PO TABS: 500.0000 mg | ORAL_TABLET | Freq: Four times a day (QID) | ORAL | 0 refills | Status: DC | PRN

## 2015-11-29 NOTE — Progress Notes (Signed)
Subjective: Doing well.  Knee pain controlled.  Patient will talk to son this morning about rehab facility options.     Objective: Vital signs in last 24 hours: Temp:  [98.6 F (37 C)-99.3 F (37.4 C)] 99.1 F (37.3 C) (10/19 0440) Pulse Rate:  [98-106] 98 (10/19 0440) Resp:  [16] 16 (10/19 0440) BP: (145-170)/(62-107) 145/62 (10/19 0440) SpO2:  [93 %-95 %] 95 % (10/19 0440)  Intake/Output from previous day: 10/18 0701 - 10/19 0700 In: 1160 [P.O.:1160] Out: 201 [Urine:201] Intake/Output this shift: No intake/output data recorded.   Recent Labs  11/27/15 0937 11/28/15 0642 11/29/15 0346  HGB 10.0* 9.5* 9.0*    Recent Labs  11/28/15 0642 11/29/15 0346  WBC 13.3* 12.7*  RBC 3.28* 3.10*  HCT 30.7* 28.8*  PLT 214 199    Recent Labs  11/27/15 0937  NA 138  K 4.5  CL 103  CO2 26  BUN 24*  CREATININE 1.62*  GLUCOSE 189*  CALCIUM 8.2*   No results for input(s): LABPT, INR in the last 72 hours.  Exam:  Pleasant BF alert and oriented.  NAD.  Wound looks good.  Staples intact.  No drainage or signs of infection.  Calf nontender.  NVI.   Assessment/Plan: Transfer to snf today if bed available.  I did discuss Dustin Flock with patient and son over the phone.  Will let me know this morning what they decide.  Patient also mentioned [facility].     Raeleigh Guinn M 11/29/2015, 8:56 AM

## 2015-11-29 NOTE — Clinical Social Work Note (Signed)
Patient will discharge today per MD order. Patient will discharge to: [facility] RN to call report prior to transportation to: [phone] Transportation: [facility]- to be arranged after SNF paperwork has been completed by family member.    CSW sent discharge summary to SNF for review.    Nonnie Done, MSW, LCSW  [phone]) [phone]  Licensed Clinical Social Worker

## 2015-11-29 NOTE — Discharge Instructions (Signed)

## 2015-11-29 NOTE — Progress Notes (Signed)
Pt IV removed, [facility] here for pick up. Called several times to give report at [facility], was on hold for over 30 minutes in all, called back and left number for them to call me back to get report Karmel Patricelli A Archivist, RN

## 2015-11-29 NOTE — Progress Notes (Signed)
Patient ID: Teresa Stanley, female   DOB: [birth year], 70 y.o.   MRN: [id number] Creatinine bump on 10/17. Recheck ordered this AM.

## 2015-11-29 NOTE — Clinical Social Work Placement (Signed)
   CLINICAL SOCIAL WORK PLACEMENT  NOTE  Date:  11/29/2015  Patient Details  Name: Teresa Stanley MRN: [id number] Date of Birth: 01-09-1946  Clinical Social Work is seeking post-discharge placement for this patient at the [place] level of care (*CSW will initial, date and re-position this form in  chart as items are completed):  Yes   Patient/family provided with [facility] Work Department's list of facilities offering this level of care within the geographic area requested by the patient (or if unable, by the patient's family).  Yes   Patient/family informed of their freedom to choose among providers that offer the needed level of care, that participate in Medicare, Medicaid or managed care program needed by the patient, have an available bed and are willing to accept the patient.  Yes   Patient/family informed of [facility]'s ownership interest in [facility] and [facility], as well as of the fact that they are under no obligation to receive care at these facilities.  PASRR submitted to EDS on 11/28/15     PASRR number received on 11/28/15     Existing PASRR number confirmed on       FL2 transmitted to all facilities in geographic area requested by pt/family on 11/28/15     FL2 transmitted to all facilities within larger geographic area on       Patient informed that his/her managed care company has contracts with or will negotiate with certain facilities, including the following:        Yes   Patient/family informed of bed offers received.  Patient chooses bed at [facility]     Physician recommends and patient chooses bed at      Patient to be transferred to [facility] on 11/29/15.  Patient to be transferred to facility by [facility]     Patient family notified on 11/29/15 of transfer.  Name of family member notified:  patient was updated at bedside and will update family as she necessary      PHYSICIAN Please prepare priority discharge summary, including medications     Additional Comment:    _______________________________________________ Dulcy Fanny, LCSW 11/29/2015, 10:12 AM

## 2015-11-29 NOTE — Discharge Summary (Signed)
Patient ID: REMINGTYN Stanley MRN: [id number] DOB/AGE: [birth year] 70 y.o.  Admit date: 11/26/2015 Discharge date: 11/29/2015  Admission Diagnoses:  Active Problems:   Right knee DJD   Discharge Diagnoses:  Active Problems:   Right knee DJD  status post Procedure(s): TOTAL KNEE ARTHROPLASTY  Renal insufficiency post op with elevated  1.62 and GFR 36.  At discharge creatinine 1.38 and GFR 44.  Past Medical History:  Diagnosis Date  . Arthritis   . Diabetes mellitus    takes Actos and Metformin daily and Victoza  . GERD (gastroesophageal reflux disease)    takes Omeprazole daily  . Headache(784.0)    occasionally  . Hyperlipidemia    takes Atorvastatin daily  . Hypertension    takes Lisinopril,Amlodipine,and Metoprolol daily  . Hypothyroidism   . Joint pain   . Thyroid disease    ?    Surgeries: Procedure(s): TOTAL KNEE ARTHROPLASTY on 11/26/2015   Consultants:   Discharged Condition: Improved  Hospital Course: [name] is an 70 y.o. female who was admitted 11/26/2015 for operative treatment of end stage DJD right knee. Patient failed conservative treatments (please see the history and physical for the specifics) and had severe unremitting pain that affects sleep, daily activities and work/hobbies. After pre-op clearance, the patient was taken to the operating room on 11/26/2015 and underwent  Procedure(s): TOTAL KNEE ARTHROPLASTY.    Patient was given perioperative antibiotics: Anti-infectives    Start     Dose/Rate Route Frequency Ordered Stop   11/26/15 2000  ceFAZolin (ANCEF) IVPB 1 g/50 mL premix     1 g 100 mL/hr over 30 Minutes Intravenous Every 8 hours 11/26/15 1716 11/27/15 0654   11/26/15 0600  ceFAZolin (ANCEF) 3 g in dextrose 5 % 50 mL IVPB     3 g 130 mL/hr over 30 Minutes Intravenous On call to O.R. 11/25/15 1550 11/26/15 1257       Patient was given sequential compression devices and early ambulation to prevent DVT.   Patient benefited  maximally from hospital stay and there were no complications. At the time of discharge, the patient was urinating/moving their bowels without difficulty, tolerating a regular diet, pain is controlled with oral pain medications and they have been cleared by PT/OT.   Recent vital signs: Patient Vitals for the past 24 hrs:  BP Temp Temp src Pulse Resp SpO2  11/29/15 0440 (!) 145/62 99.1 F (37.3 C) Oral 98 16 95 %  11/28/15 2043 (!) 170/107 99.3 F (37.4 C) Oral (!) 106 16 93 %  11/28/15 1500 (!) 145/72 98.6 F (37 C) Oral (!) 101 16 93 %     Recent laboratory studies:  Recent Labs  11/27/15 0937 11/28/15 0642 11/29/15 0346  WBC 10.5 13.3* 12.7*  HGB 10.0* 9.5* 9.0*  HCT 31.6* 30.7* 28.8*  PLT 234 214 199  NA 138  --   --   K 4.5  --   --   CL 103  --   --   CO2 26  --   --   BUN 24*  --   --   CREATININE 1.62*  --   --   GLUCOSE 189*  --   --   CALCIUM 8.2*  --   --      Discharge Medications:     Medication List    STOP taking these medications   acetaminophen 500 MG tablet Commonly known as:  TYLENOL   aspirin 81 MG tablet Replaced by:  aspirin EC 325 MG tablet     TAKE these medications   amLODipine 10 MG tablet Commonly known as:  NORVASC Take 10 mg by mouth daily.   aspirin EC 325 MG tablet Take 1 tablet (325 mg total) by mouth daily. Replaces:  aspirin 81 MG tablet   atorvastatin 20 MG tablet Commonly known as:  LIPITOR Take 20 mg by mouth daily.   carboxymethylcellulose 0.5 % Soln Commonly known as:  REFRESH PLUS Place 1 drop into both eyes daily.   lisinopril 20 MG tablet Commonly known as:  PRINIVIL,ZESTRIL Take 1 tablet (20 mg total) by mouth 2 (two) times daily.   metFORMIN 1000 MG tablet Commonly known as:  GLUCOPHAGE Take 1,000 mg by mouth 2 (two) times daily with a meal.   methocarbamol 500 MG tablet Commonly known as:  ROBAXIN Take 1 tablet (500 mg total) by mouth every 6 (six) hours as needed for muscle spasms.   omeprazole 20 MG  capsule Commonly known as:  PRILOSEC Take 20 mg by mouth daily.   oxyCODONE-acetaminophen 7.5-325 MG tablet Commonly known as:  PERCOCET Take 1 tablet by mouth every 4 (four) hours as needed for severe pain.   pioglitazone 45 MG tablet Commonly known as:  ACTOS Take 45 mg by mouth every morning.   propranolol ER 120 MG 24 hr capsule Commonly known as:  INDERAL LA Take 120 mg by mouth every morning.   triamcinolone cream 0.1 % Commonly known as:  KENALOG 1 application daily as needed.   triamterene-hydrochlorothiazide 37.5-25 MG tablet Commonly known as:  MAXZIDE-25 Take 1 tablet by mouth every morning.   Vitamin D (Ergocalciferol) 50000 units Caps capsule Commonly known as:  DRISDOL Take 50,000 Units by mouth 2 (two) times a week. Tuesday and Friday       Diagnostic Studies: Dg Chest 2 View  Result Date: 11/16/2015 CLINICAL DATA:  Preoperative chest examination prior to right knee replacement. EXAM: CHEST  2 VIEW COMPARISON:  03/24/2013 and prior radiographs FINDINGS: Cardiomediastinal silhouette is unchanged. Mild elevation of the right hemidiaphragm again noted. There is no evidence of focal airspace disease, pulmonary edema, suspicious pulmonary nodule/mass, pleural effusion, or pneumothorax. No acute bony abnormalities are identified. IMPRESSION: No active cardiopulmonary disease. Electronically Signed   By: Margarette Canada M.D.   On: 11/16/2015 10:50      Follow-up Information    Marybelle Killings, MD. Schedule an appointment as soon as possible for a visit today.   Specialty:  Orthopedic Surgery Why:  need return office visit 2 weeks postop.  Contact information: [place] [address] [phone]        Marybelle Killings, MD .   Specialty:  Orthopedic Surgery Contact information: [place] [address] [phone]           Discharge Plan:  discharge to snf  Disposition:     Signed: Lanae Crumbly for Rodell Perna  MD [facility] orthopedics [phone] 11/29/2015, 9:15 AM

## 2015-11-29 NOTE — Progress Notes (Signed)
Physical Therapy Treatment Patient Details Name: Teresa Stanley MRN: [id number] DOB: [birth year] Today's Date: 11/29/2015    History of Present Illness Patient is a 70 y/o female s/p Rt TKA. PMH of left TKA, HTN, HLD, DM.    PT Comments    Pt making slow progress with mobility during PT sessions. Based upon the patient's current mobility, recommending SNF for further rehabilitation following acute stay.   Follow Up Recommendations  SNF;Supervision for mobility/OOB     Equipment Recommendations  None recommended by PT    Recommendations for Other Services       Precautions / Restrictions Precautions Precautions: Knee;Fall Required Braces or Orthoses: Knee Immobilizer - Right Knee Immobilizer - Right: On at all times Restrictions Weight Bearing Restrictions: Yes RLE Weight Bearing: Weight bearing as tolerated    Mobility  Bed Mobility               General bed mobility comments: in chair upon arrival  Transfers Overall transfer level: Needs assistance Equipment used: Rolling walker (2 wheeled) Transfers: Sit to/from Stand Sit to Stand: Mod assist         General transfer comment: cues for use of hands, mod assist from chair.   Ambulation/Gait Ambulation/Gait assistance: Min guard Ambulation Distance (Feet): 10 Feet Assistive device: Rolling walker (2 wheeled) Gait Pattern/deviations: Step-to pattern;Decreased weight shift to right;Decreased stance time - right;Shuffle Gait velocity: very slow pattern   General Gait Details: Pt shuffling feet forward with attempts to minimize weight through Rt LE. Encouraging pt to place weight through Rt LE as tolerated.    Stairs            Wheelchair Mobility    Modified Rankin (Stroke Patients Only)       Balance Overall balance assessment: Needs assistance Sitting-balance support: No upper extremity supported Sitting balance-Leahy Scale: Fair     Standing balance support: Bilateral upper extremity  supported Standing balance-Leahy Scale: Poor Standing balance comment: using rw                    Cognition Arousal/Alertness: Awake/alert Behavior During Therapy: WFL for tasks assessed/performed Overall Cognitive Status: Within Functional Limits for tasks assessed                      Exercises Total Joint Exercises Ankle Circles/Pumps: Both;10 reps;Supine Quad Sets: Strengthening;Right;10 reps Heel Slides: AAROM;Right;10 reps Hip ABduction/ADduction: Strengthening;Right;10 reps Straight Leg Raises: Strengthening;Right;10 reps (mod assist) Goniometric ROM: knee flexion 60 degrees    General Comments        Pertinent Vitals/Pain Pain Assessment: Faces Faces Pain Scale: Hurts little more Pain Location: Rt knee Pain Descriptors / Indicators: Grimacing;Guarding Pain Intervention(s): Limited activity within patient's tolerance;Monitored during session    Home Living                      Prior Function            PT Goals (current goals can now be found in the care plan section) Acute Rehab PT Goals Patient Stated Goal: not expressed PT Goal Formulation: With patient Time For Goal Achievement: 12/11/15 Potential to Achieve Goals: Fair Progress towards PT goals: Progressing toward goals    Frequency    7X/week      PT Plan Current plan remains appropriate    Co-evaluation             End of Session Equipment Utilized During Treatment: Gait belt;Right knee immobilizer  Activity Tolerance: Patient limited by fatigue Patient left: in chair;with call bell/phone within reach     Time: 1032-1055 PT Time Calculation (min) (ACUTE ONLY): 23 min  Charges:  $Gait Training: 8-22 mins $Therapeutic Exercise: 8-22 mins                    G Codes:      Cassell Clement, PT, CSCS Pager [phone] Office [phone]  11/29/2015, 11:01 AM

## 2015-11-29 NOTE — Progress Notes (Signed)
Orthopedic Tech Progress Note Patient Details:  Teresa Stanley 12-09-1945 [id number]  Patient ID: Teresa Stanley, female   DOB: Apr 02, 1945, 70 y.o.   MRN: [id number] Applied cpm 0-40  Karolee Stamps 11/29/2015, 5:46 AM

## 2015-12-11 ENCOUNTER — Ambulatory Visit (INDEPENDENT_AMBULATORY_CARE_PROVIDER_SITE_OTHER): Payer: Medicare Other | Admitting: Orthopaedic Surgery

## 2015-12-11 ENCOUNTER — Encounter (INDEPENDENT_AMBULATORY_CARE_PROVIDER_SITE_OTHER): Payer: Self-pay | Admitting: Orthopaedic Surgery

## 2015-12-11 ENCOUNTER — Ambulatory Visit (INDEPENDENT_AMBULATORY_CARE_PROVIDER_SITE_OTHER): Payer: Medicare Other

## 2015-12-11 VITALS — Ht 62.0 in | Wt 270.0 lb

## 2015-12-11 DIAGNOSIS — M25561 Pain in right knee: Secondary | ICD-10-CM

## 2015-12-11 DIAGNOSIS — M1711 Unilateral primary osteoarthritis, right knee: Secondary | ICD-10-CM

## 2015-12-11 NOTE — Progress Notes (Signed)
Office Visit Note   Patient: Teresa Stanley           Date of Birth: 04-22-1945           MRN: [id number] Visit Date: 12/11/2015              Requested by: [name], MD [address] [place], [place] [address] PCP: Maximino Greenland, MD   Assessment & Plan: Visit Diagnoses:  1. Right knee pain, unspecified chronicity   2. Unilateral primary osteoarthritis, right knee     Plan: When she leaves skilled nursing facility she will need home health PT. She'll call me if she needs additional outpatient therapy after home health works with her for couple weeks.  Follow-Up Instructions: Return in about 1 month (around 01/10/2016).   Orders:  Orders Placed This Encounter  Procedures  . XR Knee 1-2 Views Right   No orders of the defined types were placed in this encounter.     Procedures: No procedures performed   Clinical Data: No additional findings.   Subjective: Chief Complaint  Patient presents with  . Right Knee - Routine Post Op    Patient returns status post Right Total Knee Arthroplasty on 11/26/2015. She is 2 weeks 1 day post op. Knee is doing well. She is not having a lot of pain. Therapy is going well. Patient is staying at [facility] right now, but hopes to be going home soon. She is taking Oxycodone 7.5/325 for pain with relief. Incision looks good, no redness or drainage. Staples removed and steri strips applied.    Review of Systems   Objective: Vital Signs: Ht 5\' 2"  (1.575 m)   Wt 270 lb (122.5 kg)   BMI 49.38 kg/m   Physical Exam  Constitutional: She is oriented to person, place, and time. She appears well-developed.  HENT:  Head: Normocephalic.  Right Ear: External ear normal.  Left Ear: External ear normal.  Eyes: Pupils are equal, round, and reactive to light.  Neck: No tracheal deviation present. No thyromegaly present.  Cardiovascular: Normal rate.   Pulmonary/Chest: Effort normal.  Abdominal: Soft.  Neurological: She is  alert and oriented to person, place, and time.  Skin: Skin is warm and dry.  Psychiatric: She has a normal mood and affect. Her behavior is normal.    Ortho Exam anterior right total knee arthroplasty incision looks good. Steri-Strips changed she can do a straight leg raise. She is a mature with a walker. She is leaving skilled nursing facility on Friday and will need home health therapy.  Specialty Comments:  No specialty comments available.  Imaging: Xr Knee 1-2 Views Right  Result Date: 12/11/2015 Standing AP both knees and lateral right knee reviewed. Good position alignment of both total knee arthroplasties no postoperative palpitations Impression: Postop right total knee arthroplasty good position and alignment    PMFS History: Patient Active Problem List   Diagnosis Date Noted  . Right knee DJD 11/26/2015  . S/P total knee arthroplasty 04/18/2013  . Unilateral primary osteoarthritis, right knee 04/04/2013    Class: Diagnosis of  . Candidal intertrigo 08/09/2012  . Hypertension 12/15/2011  . Diabetes mellitus ([place]) 12/15/2011   Past Medical History:  Diagnosis Date  . Arthritis   . Diabetes mellitus    takes Actos and Metformin daily and Victoza  . GERD (gastroesophageal reflux disease)    takes Omeprazole daily  . Headache(784.0)    occasionally  . Hyperlipidemia    takes Atorvastatin daily  .  Hypertension    takes Lisinopril,Amlodipine,and Metoprolol daily  . Hypothyroidism   . Joint pain   . Thyroid disease    ?    Family History  Problem Relation Age of Onset  . Diabetes Mother   . Hypertension Mother   . Diabetes Father   . Asthma Father   . Hypertension Father   . Hypertension Daughter   . Hypertension Sister     2  . Diabetes Sister   . Hypertension Brother     5  . Kidney disease Brother     Past Surgical History:  Procedure Laterality Date  . ABDOMINAL HYSTERECTOMY    . BREAST BIOPSY Left   . CARDIAC CATHETERIZATION  2009  . CATARACT  EXTRACTION Right   . EYE SURGERY    . JOINT REPLACEMENT    . KNEE ARTHROPLASTY Left 04/04/2013   Procedure: COMPUTER ASSISTED TOTAL KNEE ARTHROPLASTY;  Surgeon: Marybelle Killings, MD;  Location: [place];  Service: Orthopedics;  Laterality: Left;  Left Total Knee Arthroplasty, Cemented  . TOTAL KNEE ARTHROPLASTY Right 11/26/2015   Procedure: TOTAL KNEE ARTHROPLASTY;  Surgeon: Marybelle Killings, MD;  Location: [place];  Service: Orthopedics;  Laterality: Right;   Social History   Occupational History  . Not on file.   Social History Main Topics  . Smoking status: Never Smoker  . Smokeless tobacco: Never Used  . Alcohol use No  . Drug use: No  . Sexual activity: Yes    Birth control/ protection: Surgical

## 2015-12-17 ENCOUNTER — Telehealth (INDEPENDENT_AMBULATORY_CARE_PROVIDER_SITE_OTHER): Payer: Self-pay | Admitting: Orthopaedic Surgery

## 2015-12-17 NOTE — Telephone Encounter (Signed)
Skeet Simmer (PT) from [facility] called needing verbal orders for (PT) 1 wk 1, 3 wk 1 and 2 wk 1 for 6 visits. He advised he saw patient on Saturday.  The number to contact him is [phone]

## 2015-12-18 NOTE — Telephone Encounter (Signed)
IC and advised orders ok 

## 2015-12-20 ENCOUNTER — Telehealth (INDEPENDENT_AMBULATORY_CARE_PROVIDER_SITE_OTHER): Payer: Self-pay | Admitting: Orthopaedic Surgery

## 2015-12-20 DIAGNOSIS — M25561 Pain in right knee: Secondary | ICD-10-CM

## 2015-12-20 NOTE — Telephone Encounter (Signed)
Patient requesting a RX refill oxycodone.  Contact Info: [phone]

## 2015-12-20 NOTE — Telephone Encounter (Signed)
Please advise 

## 2015-12-20 NOTE — Telephone Encounter (Signed)
Ok to switch  norco 5/325    # 30   Take one daily prn pain.

## 2015-12-21 MED ORDER — HYDROCODONE-ACETAMINOPHEN 5-325 MG PO TABS: 1.0000 | ORAL_TABLET | Freq: Every day | ORAL | 0 refills | Status: DC

## 2015-12-21 NOTE — Addendum Note (Signed)
Addended by: Meyer Cory on: 12/21/2015 08:46 AM   Modules accepted: Orders

## 2015-12-21 NOTE — Telephone Encounter (Signed)
Script entered.  Waiting for Dr. Lorin Mercy to sign this afternoon and will then be put up front for pick up. Patient has been advised.

## 2016-01-08 ENCOUNTER — Encounter (INDEPENDENT_AMBULATORY_CARE_PROVIDER_SITE_OTHER): Payer: Self-pay | Admitting: Orthopaedic Surgery

## 2016-01-08 ENCOUNTER — Ambulatory Visit (INDEPENDENT_AMBULATORY_CARE_PROVIDER_SITE_OTHER): Payer: Medicare Other | Admitting: Orthopaedic Surgery

## 2016-01-08 VITALS — BP 144/82 | HR 85 | Ht 62.0 in | Wt 270.0 lb

## 2016-01-08 DIAGNOSIS — Z96651 Presence of right artificial knee joint: Secondary | ICD-10-CM

## 2016-01-08 NOTE — Progress Notes (Signed)
   Post-Op Visit Note   Patient: Teresa Stanley           Date of Birth: 1945/06/29           MRN: [id number] Visit Date: 01/08/2016 PCP: Maximino Greenland, MD   Assessment & Plan:  Chief Complaint:  Chief Complaint  Patient presents with  . Right Knee - Routine Post Op   Visit Diagnoses:  1. Status post total right knee replacement     Plan:  Transition to outpatient physical therapy she is off her pain medication and home health physical therapy stops after tomorrow.  Follow-Up Instructions: No Follow-up on file.   Orders:  No orders of the defined types were placed in this encounter.  No orders of the defined types were placed in this encounter.  Patient returns for one month follow up. She is status post right total knee arthroplasty on 11/26/2015. She is 6 weeks 1 day out.  She ambulates with cane today. She states that tomorrow is her last day of HHPT.  She states the knee is coming along.  She is not really having to use anything for pain. Patient would like to know when she can discontinue Aspirin 325mg  and go back to her Aspirin 81mg .   PMFS History: Patient Active Problem List   Diagnosis Date Noted  . Right knee DJD 11/26/2015  . S/P total knee arthroplasty 04/18/2013  . Unilateral primary osteoarthritis, right knee 04/04/2013    Class: Diagnosis of  . Candidal intertrigo 08/09/2012  . Hypertension 12/15/2011  . Diabetes mellitus ([place]) 12/15/2011   Past Medical History:  Diagnosis Date  . Arthritis   . Diabetes mellitus    takes Actos and Metformin daily and Victoza  . GERD (gastroesophageal reflux disease)    takes Omeprazole daily  . Headache(784.0)    occasionally  . Hyperlipidemia    takes Atorvastatin daily  . Hypertension    takes Lisinopril,Amlodipine,and Metoprolol daily  . Hypothyroidism   . Joint pain   . Thyroid disease    ?    Family History  Problem Relation Age of Onset  . Diabetes Mother   . Hypertension Mother   . Diabetes Father    . Asthma Father   . Hypertension Father   . Hypertension Daughter   . Hypertension Sister     2  . Diabetes Sister   . Hypertension Brother     5  . Kidney disease Brother     Past Surgical History:  Procedure Laterality Date  . ABDOMINAL HYSTERECTOMY    . BREAST BIOPSY Left   . CARDIAC CATHETERIZATION  2009  . CATARACT EXTRACTION Right   . EYE SURGERY    . JOINT REPLACEMENT    . KNEE ARTHROPLASTY Left 04/04/2013   Procedure: COMPUTER ASSISTED TOTAL KNEE ARTHROPLASTY;  Surgeon: Marybelle Killings, MD;  Location: [facility];  Service: Orthopedics;  Laterality: Left;  Left Total Knee Arthroplasty, Cemented  . TOTAL KNEE ARTHROPLASTY Right 11/26/2015   Procedure: TOTAL KNEE ARTHROPLASTY;  Surgeon: Marybelle Killings, MD;  Location: [place];  Service: Orthopedics;  Laterality: Right;   Social History   Occupational History  . Not on file.   Social History Main Topics  . Smoking status: Never Smoker  . Smokeless tobacco: Never Used  . Alcohol use No  . Drug use: No  . Sexual activity: Yes    Birth control/ protection: Surgical

## 2016-01-16 ENCOUNTER — Ambulatory Visit: Payer: Medicare Other | Attending: Internal Medicine | Admitting: Physical Therapy

## 2016-01-16 DIAGNOSIS — M25661 Stiffness of right knee, not elsewhere classified: Secondary | ICD-10-CM | POA: Diagnosis present

## 2016-01-16 DIAGNOSIS — M25561 Pain in right knee: Secondary | ICD-10-CM | POA: Insufficient documentation

## 2016-01-16 DIAGNOSIS — R262 Difficulty in walking, not elsewhere classified: Secondary | ICD-10-CM | POA: Insufficient documentation

## 2016-01-16 NOTE — Therapy (Signed)
[place], [place], [address] Phone: [phone]   Fax:  [phone]  Physical Therapy Evaluation  Patient Details  Name: Teresa Stanley MRN: [id number] Date of Birth: [birth year] Referring Provider: Rodell Perna, MD  Encounter Date: 01/16/2016      PT End of Session - 01/16/16 1412    Visit Number 1   Number of Visits 13   Date for PT Re-Evaluation 02/29/16   Authorization Type UHC MCR- KX at visit 15   PT Start Time 1330   PT Stop Time 1411   PT Time Calculation (min) 41 min   Activity Tolerance Patient tolerated treatment well   Behavior During Therapy [facility] for tasks assessed/performed      Past Medical History:  Diagnosis Date  . Arthritis   . Diabetes mellitus    takes Actos and Metformin daily and Victoza  . GERD (gastroesophageal reflux disease)    takes Omeprazole daily  . Headache(784.0)    occasionally  . Hyperlipidemia    takes Atorvastatin daily  . Hypertension    takes Lisinopril,Amlodipine,and Metoprolol daily  . Hypothyroidism   . Joint pain   . Thyroid disease    ?    Past Surgical History:  Procedure Laterality Date  . ABDOMINAL HYSTERECTOMY    . BREAST BIOPSY Left   . CARDIAC CATHETERIZATION  2009  . CATARACT EXTRACTION Right   . EYE SURGERY    . JOINT REPLACEMENT    . KNEE ARTHROPLASTY Left 04/04/2013   Procedure: COMPUTER ASSISTED TOTAL KNEE ARTHROPLASTY;  Surgeon: Marybelle Killings, MD;  Location: [place];  Service: Orthopedics;  Laterality: Left;  Left Total Knee Arthroplasty, Cemented  . TOTAL KNEE ARTHROPLASTY Right 11/26/2015   Procedure: TOTAL KNEE ARTHROPLASTY;  Surgeon: Marybelle Killings, MD;  Location: [place];  Service: Orthopedics;  Laterality: Right;    There were no vitals filed for this visit.       Subjective Assessment - 01/16/16 1338    Subjective Pt reports knee is feeling better, off pain medicine. Some soreness on anterior knee. Uses ice packs throughout the  day. Uses a single point cane when leaving home. Was allowed to drive beginning last week.    How long can you walk comfortably? able to walk grocery store with use of cart   Patient Stated Goals shopping, return to church, go to beach/travel, spend time with grand children   Currently in Pain? Yes   Pain Score 5    Pain Location Knee   Pain Orientation Right   Pain Descriptors / Indicators Aching   Pain Type Surgical pain   Aggravating Factors  vaccuuming    Pain Relieving Factors walking, ice            [facility] PT Assessment - 01/16/16 0001      Assessment   Medical Diagnosis s/p R TKA   Referring Provider Rodell Perna, MD   Onset Date/Surgical Date 11/26/15   Hand Dominance Right   Next MD Visit 12/29   Prior Therapy inpatient rehab 2 weeks, home health PT 3 weeks     Precautions   Precaution Comments Diabetes Type 2     Restrictions   Weight Bearing Restrictions No     Balance Screen   Has the patient fallen in the past 6 months No     [place] residence   Living Arrangements Spouse/significant other     Prior Function  Level of Independence Independent     Cognition   Overall Cognitive Status Within Functional Limits for tasks assessed     Observation/Other Assessments   Focus on Therapeutic Outcomes (FOTO)  45% ability     Sensation   Additional Comments WFL     ROM / Strength   AROM / PROM / Strength AROM;PROM;Strength     AROM   AROM Assessment Site Knee   Right/Left Knee Right   Right Knee Extension -2   Right Knee Flexion 100     PROM   PROM Assessment Site Knee   Right/Left Knee Right   Right Knee Extension 0   Right Knee Flexion 110     Strength   Strength Assessment Site Knee;Hip   Right/Left Hip Right;Left   Right Hip Flexion 3/5   Right Hip Extension 3-/5  meas in supine, knee flx to compensate   Right Hip ABduction 3+/5  meas in supine   Left Hip Flexion 3/5   Left Hip Extension 3+/5  meas  in supine   Left Hip ABduction 3+/5  meas in supine   Right/Left Knee Right;Left   Right Knee Flexion 4/5   Right Knee Extension 4+/5   Left Knee Flexion 4+/5   Left Knee Extension 5/5     Palpation   Patella mobility WFL   Palpation comment limited scar mobility     Ambulation/Gait   Assistive device Straight cane   Gait Comments antalgic decreased step length, flexed at hip, flat foot; pt out of breath after 100 ft walk in clinic     High Level Balance   High Level Balance Comments SLS and tandem unable                   [facility] Adult PT Treatment/Exercise - 01/16/16 0001      Exercises   Exercises Knee/Hip     Knee/Hip Exercises: Standing   Gait Training heel toe, upright posture   Other Standing Knee Exercises standing glut set with quad sets     Knee/Hip Exercises: Seated   Long Arc Quad Both;10 reps                PT Education - 01/16/16 1504    Education provided Yes   Education Details anatomy of condition, POC, HEP, exercise form/rationale   Person(s) Educated Patient   Methods Explanation;Demonstration;Tactile cues;Verbal cues   Comprehension Verbalized understanding;Returned demonstration;Verbal cues required;Tactile cues required;Need further instruction          PT Short Term Goals - 01/16/16 1511      PT SHORT TERM GOAL #1   Title 0-120 deg ROM in knee by 12/29   Baseline see flowsheet   Time 3   Period Weeks   Status New     PT SHORT TERM GOAL #2   Title Pt will demo heel-toe gait without cuing   Baseline required cuing at eval   Time 3   Period Weeks   Status New     PT SHORT TERM GOAL #3   Title Pt will be able to return to church and utilize exercises while seated and standing   Baseline has not returned at this time   Time 6   Period Weeks   Status New           PT Long Term Goals - 01/16/16 1512      PT LONG TERM GOAL #1   Title FOTO to 56% ability to indicate significant functional  improvement by  02/29/16   Baseline 45% ability at eval   Time 6   Period Weeks   Status New     PT LONG TERM GOAL #2   Title Hip strength to at least 4/5 grossly to provide proximal support to knee   Baseline see flowsheet   Time 6   Period Weeks   Status New     PT LONG TERM GOAL #3   Title Pt will demo SLS for at least 3 sec without UE support to indicate necessary balance ability during stance phase of gait   Baseline unable at eval   Time 6   Period Weeks   Status New     PT LONG TERM GOAL #4   Title Pt will be able to ambulate around the shopping center with minimal rest breaks required to return to PLOF.    Baseline unable at eval   Time 6   Period Weeks   Status New               Plan - 01/16/16 1505    Clinical Impression Statement Pt presents to PT with complaints of R knee pain following TKA in October. Pt demo ROM and strength that will both benefit from further training in order to improve functional strength and mobility. Incision is well healed and has no sign of infection, minimal swelling present. Concerns of fall risk due to inability to perform tandem or single leg stance.    Rehab Potential Good   PT Frequency 2x / week   PT Duration 6 weeks   PT Treatment/Interventions ADLs/Self Care Home Management;Cryotherapy;Electrical Stimulation;Functional mobility training;Stair training;Gait training;Moist Heat;Therapeutic activities;Balance training;Therapeutic exercise;Neuromuscular re-education;Patient/family education;Passive range of motion;Scar mobilization;Manual techniques;Taping   PT Next Visit Plan nu step, ROM, glut strength    PT Home Exercise Plan continue HEP from home health, heel-toe gait, standing glut+quad set   Consulted and Agree with Plan of Care Patient      Patient will benefit from skilled therapeutic intervention in order to improve the following deficits and impairments:  Abnormal gait, Decreased range of motion, Difficulty walking, Increased  fascial restricitons, Decreased activity tolerance, Pain, Improper body mechanics, Decreased balance, Decreased scar mobility, Decreased strength  Visit Diagnosis: Acute pain of right knee - Plan: PT plan of care cert/re-cert  Stiffness of right knee, not elsewhere classified - Plan: PT plan of care cert/re-cert  Difficulty in walking, not elsewhere classified - Plan: PT plan of care cert/re-cert      G-Codes - [id number]    Functional Assessment Tool Used FOTO 45% ability (goal 56%), clinical judgement   Functional Limitation Mobility: Walking and moving around   Mobility: Walking and Moving Around Current Status [phone]) At least 40 percent but less than 60 percent impaired, limited or restricted   Mobility: Walking and Moving Around Goal Status [phone]) At least 20 percent but less than 40 percent impaired, limited or restricted       Problem List Patient Active Problem List   Diagnosis Date Noted  . Right knee DJD 11/26/2015  . S/P total knee arthroplasty 04/18/2013  . Unilateral primary osteoarthritis, right knee 04/04/2013    Class: Diagnosis of  . Candidal intertrigo 08/09/2012  . Hypertension 12/15/2011  . Diabetes mellitus ([place]) 12/15/2011   Teresa Stanley PT, DPT 01/16/16 3:20 PM   [facility] [address] [place], [place], [address] Phone: [phone]   Fax:  [phone]  Name: Teresa Stanley MRN:  [id number] Date of Birth: [birth year]

## 2016-01-29 ENCOUNTER — Encounter: Payer: Self-pay | Admitting: Physical Therapy

## 2016-01-29 ENCOUNTER — Ambulatory Visit: Payer: Medicare Other | Admitting: Physical Therapy

## 2016-01-29 DIAGNOSIS — M25561 Pain in right knee: Secondary | ICD-10-CM

## 2016-01-29 DIAGNOSIS — M25661 Stiffness of right knee, not elsewhere classified: Secondary | ICD-10-CM

## 2016-01-29 DIAGNOSIS — R262 Difficulty in walking, not elsewhere classified: Secondary | ICD-10-CM

## 2016-01-29 NOTE — Therapy (Signed)
[place], [place], [address] Phone: [phone]   Fax:  [phone]  Physical Therapy Treatment  Patient Details  Name: CICILY MACINA MRN: [id number] Date of Birth: 1945-04-17 Referring Provider: Rodell Perna, MD  Encounter Date: 01/29/2016      PT End of Session - 01/29/16 1330    Visit Number 2   Number of Visits 13   Date for PT Re-Evaluation 02/29/16   Authorization Type UHC MCR- KX at visit 15   PT Start Time 1330   PT Stop Time 1412   PT Time Calculation (min) 42 min   Activity Tolerance Patient tolerated treatment well   Behavior During Therapy [facility] for tasks assessed/performed      Past Medical History:  Diagnosis Date  . Arthritis   . Diabetes mellitus    takes Actos and Metformin daily and Victoza  . GERD (gastroesophageal reflux disease)    takes Omeprazole daily  . Headache(784.0)    occasionally  . Hyperlipidemia    takes Atorvastatin daily  . Hypertension    takes Lisinopril,Amlodipine,and Metoprolol daily  . Hypothyroidism   . Joint pain   . Thyroid disease    ?    Past Surgical History:  Procedure Laterality Date  . ABDOMINAL HYSTERECTOMY    . BREAST BIOPSY Left   . CARDIAC CATHETERIZATION  2009  . CATARACT EXTRACTION Right   . EYE SURGERY    . JOINT REPLACEMENT    . KNEE ARTHROPLASTY Left 04/04/2013   Procedure: COMPUTER ASSISTED TOTAL KNEE ARTHROPLASTY;  Surgeon: Marybelle Killings, MD;  Location: [place];  Service: Orthopedics;  Laterality: Left;  Left Total Knee Arthroplasty, Cemented  . TOTAL KNEE ARTHROPLASTY Right 11/26/2015   Procedure: TOTAL KNEE ARTHROPLASTY;  Surgeon: Marybelle Killings, MD;  Location: [facility] [place];  Service: Orthopedics;  Laterality: Right;    There were no vitals filed for this visit.      Subjective Assessment - 01/29/16 1334    Subjective Pt reports knee is doing okay, did exercises at home. Went to a funeral and grocery store on Saturday, both required  a good amount of walking.    Patient Stated Goals shopping, return to church, go to beach/travel, spend time with grand children   Currently in Pain? Yes   Pain Score 5    Pain Location Knee   Pain Orientation Right   Pain Descriptors / Indicators Tightness   Pain Type Surgical pain                         [facility] Adult PT Treatment/Exercise - 01/29/16 0001      Knee/Hip Exercises: Stretches   Other Knee/Hip Stretches supine knee extension 2 min     Knee/Hip Exercises: Aerobic   Stationary Bike L3 5 min     Knee/Hip Exercises: Standing   Heel Raises Limitations heel/toe raises   Functional Squat Limitations using sink for support   SLS bilateral to tolerance   Gait Training heel toe, posture with cane     Knee/Hip Exercises: Seated   Long Arc Quad Limitations alt LE 2 min 3s holds   Other Seated Knee/Hip Exercises alt/opp heel/toe raises   Abd/Adduction Limitations ball and band, knees/ankles x20 each     Knee/Hip Exercises: Supine   Heel Slides 20 reps   Bridges with Ball Squeeze 15 reps  3s holds, mini bridge   Straight Leg Raises 15 reps   Other  Supine Knee/Hip Exercises abduction heel slides     Knee/Hip Exercises: Sidelying   Clams 30                PT Education - 01/29/16 1336    Education provided Yes   Education Details exercise form/rationale, frequent use of ice   Person(s) Educated Patient   Methods Explanation;Demonstration;Verbal cues;Tactile cues   Comprehension Verbalized understanding;Returned demonstration;Verbal cues required;Tactile cues required;Need further instruction          PT Short Term Goals - 01/16/16 1511      PT SHORT TERM GOAL #1   Title 0-120 deg ROM in knee by 12/29   Baseline see flowsheet   Time 3   Period Weeks   Status New     PT SHORT TERM GOAL #2   Title Pt will demo heel-toe gait without cuing   Baseline required cuing at eval   Time 3   Period Weeks   Status New     PT SHORT TERM GOAL  #3   Title Pt will be able to return to church and utilize exercises while seated and standing   Baseline has not returned at this time   Time 6   Period Weeks   Status New           PT Long Term Goals - 01/16/16 1512      PT LONG TERM GOAL #1   Title FOTO to 56% ability to indicate significant functional improvement by 02/29/16   Baseline 45% ability at eval   Time 6   Period Weeks   Status New     PT LONG TERM GOAL #2   Title Hip strength to at least 4/5 grossly to provide proximal support to knee   Baseline see flowsheet   Time 6   Period Weeks   Status New     PT LONG TERM GOAL #3   Title Pt will demo SLS for at least 3 sec without UE support to indicate necessary balance ability during stance phase of gait   Baseline unable at eval   Time 6   Period Weeks   Status New     PT LONG TERM GOAL #4   Title Pt will be able to ambulate around the shopping center with minimal rest breaks required to return to PLOF.    Baseline unable at eval   Time 6   Period Weeks   Status New               Plan - 01/29/16 1412    Clinical Impression Statement Pt did well with exercises, verbalized fatigue and required rest breaks. Unable to hold SLS wihtout UE support.    PT Next Visit Plan nu step, ROM, glut strength    PT Home Exercise Plan continue HEP from home health, heel-toe gait, standing glut+quad set   Consulted and Agree with Plan of Care Patient      Patient will benefit from skilled therapeutic intervention in order to improve the following deficits and impairments:     Visit Diagnosis: Acute pain of right knee  Stiffness of right knee, not elsewhere classified  Difficulty in walking, not elsewhere classified     Problem List Patient Active Problem List   Diagnosis Date Noted  . Right knee DJD 11/26/2015  . S/P total knee arthroplasty 04/18/2013  . Unilateral primary osteoarthritis, right knee 04/04/2013    Class: Diagnosis of  . Candidal  intertrigo 08/09/2012  . Hypertension 12/15/2011  .  Diabetes mellitus ([place]) 12/15/2011    Prezley Qadir C. Hector Venne PT, DPT 01/29/16 2:13 PM   [place] [facility] [address] [place], [place], [address] Phone: [phone]   Fax:  [phone]  Name: GENIYAH LOERCH MRN: [id number] Date of Birth: [birth year]

## 2016-01-30 ENCOUNTER — Ambulatory Visit: Payer: Medicare Other | Admitting: Physical Therapy

## 2016-01-30 DIAGNOSIS — M25561 Pain in right knee: Secondary | ICD-10-CM

## 2016-01-30 DIAGNOSIS — R262 Difficulty in walking, not elsewhere classified: Secondary | ICD-10-CM

## 2016-01-30 DIAGNOSIS — M25661 Stiffness of right knee, not elsewhere classified: Secondary | ICD-10-CM

## 2016-01-30 NOTE — Therapy (Signed)
[place], [place], [address] Phone: [phone]   Fax:  [phone]  Physical Therapy Treatment  Patient Details  Name: Teresa Stanley MRN: [id number] Date of Birth: [birth year] Referring Provider: Rodell Perna, MD  Encounter Date: 01/30/2016      PT End of Session - 01/30/16 1337    Visit Number 3   Number of Visits 13   Date for PT Re-Evaluation 02/29/16   Authorization Type UHC MCR- KX at visit 15   PT Start Time 1332   PT Stop Time 1410   PT Time Calculation (min) 38 min   Activity Tolerance Patient tolerated treatment well   Behavior During Therapy [facility] for tasks assessed/performed      Past Medical History:  Diagnosis Date  . Arthritis   . Diabetes mellitus    takes Actos and Metformin daily and Victoza  . GERD (gastroesophageal reflux disease)    takes Omeprazole daily  . Headache(784.0)    occasionally  . Hyperlipidemia    takes Atorvastatin daily  . Hypertension    takes Lisinopril,Amlodipine,and Metoprolol daily  . Hypothyroidism   . Joint pain   . Thyroid disease    ?    Past Surgical History:  Procedure Laterality Date  . ABDOMINAL HYSTERECTOMY    . BREAST BIOPSY Left   . CARDIAC CATHETERIZATION  2009  . CATARACT EXTRACTION Right   . EYE SURGERY    . JOINT REPLACEMENT    . KNEE ARTHROPLASTY Left 04/04/2013   Procedure: COMPUTER ASSISTED TOTAL KNEE ARTHROPLASTY;  Surgeon: Marybelle Killings, MD;  Location: [place];  Service: Orthopedics;  Laterality: Left;  Left Total Knee Arthroplasty, Cemented  . TOTAL KNEE ARTHROPLASTY Right 11/26/2015   Procedure: TOTAL KNEE ARTHROPLASTY;  Surgeon: Marybelle Killings, MD;  Location: [place];  Service: Orthopedics;  Laterality: Right;    There were no vitals filed for this visit.      Subjective Assessment - 01/30/16 1335    Subjective a little pain, not much    Currently in Pain? Yes   Pain Score 4    Pain Location Knee   Pain Orientation Right    Aggravating Factors  always just a little pain, bending hurts a little            [facility] PT Assessment - 01/30/16 0001      AROM   Right Knee Extension 0   Right Knee Flexion 117                     [facility] Adult PT Treatment/Exercise - 01/30/16 0001      Knee/Hip Exercises: Stretches   Other Knee/Hip Stretches 2lb extension prop 3 min     Knee/Hip Exercises: Aerobic   Stationary Bike 5 min L3     Knee/Hip Exercises: Standing   Heel Raises 20 reps   Hip Abduction Both;2 sets;10 reps   Hip Extension Both;2 sets;10 reps   Extension Limitations elbows on counter     Knee/Hip Exercises: Seated   Long Arc Quad 15 reps   Long Arc Quad Limitations x15     Knee/Hip Exercises: Supine   Heel Slides 20 reps   Bridges Limitations x15   Straight Leg Raises 15 reps     Knee/Hip Exercises: Sidelying   Hip ABduction 20 reps;10 reps;Right                PT Education - 01/30/16 1336    Education  provided Yes   Education Details exercise form/rationale   Person(s) Educated Patient   Methods Explanation;Demonstration;Tactile cues;Verbal cues   Comprehension Verbalized understanding;Returned demonstration;Verbal cues required;Tactile cues required;Need further instruction          PT Short Term Goals - 01/16/16 1511      PT SHORT TERM GOAL #1   Title 0-120 deg ROM in knee by 12/29   Baseline see flowsheet   Time 3   Period Weeks   Status New     PT SHORT TERM GOAL #2   Title Pt will demo heel-toe gait without cuing   Baseline required cuing at eval   Time 3   Period Weeks   Status New     PT SHORT TERM GOAL #3   Title Pt will be able to return to church and utilize exercises while seated and standing   Baseline has not returned at this time   Time 6   Period Weeks   Status New           PT Long Term Goals - 01/16/16 1512      PT LONG TERM GOAL #1   Title FOTO to 56% ability to indicate significant functional improvement by 02/29/16    Baseline 45% ability at eval   Time 6   Period Weeks   Status New     PT LONG TERM GOAL #2   Title Hip strength to at least 4/5 grossly to provide proximal support to knee   Baseline see flowsheet   Time 6   Period Weeks   Status New     PT LONG TERM GOAL #3   Title Pt will demo SLS for at least 3 sec without UE support to indicate necessary balance ability during stance phase of gait   Baseline unable at eval   Time 6   Period Weeks   Status New     PT LONG TERM GOAL #4   Title Pt will be able to ambulate around the shopping center with minimal rest breaks required to return to PLOF.    Baseline unable at eval   Time 6   Period Weeks   Status New               Plan - 01/30/16 1338    Clinical Impression Statement Significant fatigue in exercises today. Pt required some assistance in quiding movement in sidelying hip abduction due to weakness.    PT Next Visit Plan check goals, ROM, glut strength, nu step   PT Home Exercise Plan continue HEP from home health, heel-toe gait, standing glut+quad set   Consulted and Agree with Plan of Care Patient      Patient will benefit from skilled therapeutic intervention in order to improve the following deficits and impairments:     Visit Diagnosis: Acute pain of right knee  Stiffness of right knee, not elsewhere classified  Difficulty in walking, not elsewhere classified     Problem List Patient Active Problem List   Diagnosis Date Noted  . Right knee DJD 11/26/2015  . S/P total knee arthroplasty 04/18/2013  . Unilateral primary osteoarthritis, right knee 04/04/2013    Class: Diagnosis of  . Candidal intertrigo 08/09/2012  . Hypertension 12/15/2011  . Diabetes mellitus ([place]) 12/15/2011    Keanon Bevins C. Urban Naval PT, DPT 01/30/16 2:12 PM   [facility] [place] [facility] [address] [place], [place], [address] Phone: [phone]   Fax:  [phone]  Name: Teresa Stanley  MRN:  [id number] Date of Birth: 03-23-1945

## 2016-02-06 ENCOUNTER — Ambulatory Visit: Payer: Medicare Other | Admitting: Physical Therapy

## 2016-02-06 ENCOUNTER — Encounter: Payer: Self-pay | Admitting: Physical Therapy

## 2016-02-06 DIAGNOSIS — R262 Difficulty in walking, not elsewhere classified: Secondary | ICD-10-CM

## 2016-02-06 DIAGNOSIS — M25661 Stiffness of right knee, not elsewhere classified: Secondary | ICD-10-CM

## 2016-02-06 DIAGNOSIS — M25561 Pain in right knee: Secondary | ICD-10-CM | POA: Diagnosis not present

## 2016-02-06 NOTE — Therapy (Signed)
[place], [place], [address] Phone: [phone]   Fax:  [phone]  Physical Therapy Treatment  Patient Details  Name: Teresa Stanley MRN: [id number] Date of Birth: 10-Feb-1946 Referring Provider: Rodell Perna, MD  Encounter Date: 02/06/2016      PT End of Session - 02/06/16 1320    Visit Number 4   Number of Visits 13   Date for PT Re-Evaluation 02/29/16   Authorization Type [employer] MCR- KX at visit 15   PT Start Time 1330   PT Stop Time 1408   PT Time Calculation (min) 38 min   Activity Tolerance Patient tolerated treatment well   Behavior During Therapy [facility] for tasks assessed/performed      Past Medical History:  Diagnosis Date  . Arthritis   . Diabetes mellitus    takes Actos and Metformin daily and Victoza  . GERD (gastroesophageal reflux disease)    takes Omeprazole daily  . Headache(784.0)    occasionally  . Hyperlipidemia    takes Atorvastatin daily  . Hypertension    takes Lisinopril,Amlodipine,and Metoprolol daily  . Hypothyroidism   . Joint pain   . Thyroid disease    ?    Past Surgical History:  Procedure Laterality Date  . ABDOMINAL HYSTERECTOMY    . BREAST BIOPSY Left   . CARDIAC CATHETERIZATION  2009  . CATARACT EXTRACTION Right   . EYE SURGERY    . JOINT REPLACEMENT    . KNEE ARTHROPLASTY Left 04/04/2013   Procedure: COMPUTER ASSISTED TOTAL KNEE ARTHROPLASTY;  Surgeon: Marybelle Killings, MD;  Location: [place];  Service: Orthopedics;  Laterality: Left;  Left Total Knee Arthroplasty, Cemented  . TOTAL KNEE ARTHROPLASTY Right 11/26/2015   Procedure: TOTAL KNEE ARTHROPLASTY;  Surgeon: Marybelle Killings, MD;  Location: [place];  Service: Orthopedics;  Laterality: Right;    There were no vitals filed for this visit.      Subjective Assessment - 02/06/16 1330    Subjective pt reports doing her HEP and is doing well with them. has been doing some shopping and was able to keep up with her  daughter, denied pain.    Patient Stated Goals shopping, return to church, go to beach/travel, spend time with grand children   Currently in Pain? No/denies            [facility] PT Assessment - 02/06/16 0001      Observation/Other Assessments   Focus on Therapeutic Outcomes (FOTO)  53% ability     AROM   Right Knee Extension 0   Right Knee Flexion 121     Strength   Right Hip Flexion 3/5   Right Hip Extension 3/5  meas in standing   Right Hip ABduction 3/5  sidelying   Left Hip Flexion 3/5   Left Hip Extension 3+/5  meas in standing   Left Hip ABduction 3/5  sidelying   Right Knee Flexion 5/5   Right Knee Extension 4+/5   Left Knee Flexion 4+/5   Left Knee Extension 5/5                     [facility] Adult PT Treatment/Exercise - 02/06/16 0001      Knee/Hip Exercises: Aerobic   Stationary Bike 5 min L4     Knee/Hip Exercises: Standing   Hip Abduction Both;10 reps   Abduction Limitations LE in ER   Hip Extension Both;10 reps   Extension Limitations elbows on table  Other Standing Knee Exercises side stepping with back to table for cuing to stand upright     Knee/Hip Exercises: Supine   Straight Leg Raises Both;15 reps                PT Education - 2016-03-07 1330    Education provided Yes   Education Details exercise form/[place], HEP   Person(s) Educated Patient   Methods Explanation;Demonstration;Tactile cues;Verbal cues;Handout   Comprehension Verbalized understanding;Returned demonstration;Verbal cues required;Tactile cues required;Need further instruction          PT Short Term Goals - 2016/03/07 1334      PT SHORT TERM GOAL #1   Title 0-120 deg ROM in knee by 12/29   Status Achieved     PT SHORT TERM GOAL #2   Title Pt will demo heel-toe gait without cuing   Status Achieved     PT SHORT TERM GOAL #3   Title Pt will be able to return to church and utilize exercises while seated and standing   Baseline has not returned due to  husband being sick   Status On-going           PT Long Term Goals - March 07, 2016 1342      PT LONG TERM GOAL #1   Title FOTO to 56% ability to indicate significant functional improvement by 02/29/16   Baseline 53% ability on 2023/03/08   Status Achieved     PT LONG TERM GOAL #2   Title Hip strength to at least 4/5 grossly to provide proximal support to knee   Baseline see flowsheet   Status On-going     PT LONG TERM GOAL #3   Title Pt will demo SLS for at least 3 sec without UE support to indicate necessary balance ability during stance phase of gait   Baseline 2 sec unsteady on 03-08-2023   Status On-going     PT LONG TERM GOAL #4   Title Pt will be able to ambulate around the shopping center with minimal rest breaks required to return to PLOF.    Status Achieved               Plan - 2016/03/07 1409    Clinical Impression Statement Pt demo good improvement toward goals, biggest limitation is lack of strength in bilateral LE placing pt at risk of fall and fatigue with ADLs. Will continue to progress exercises to improve strength as pt tol.    PT Next Visit Plan nu step, LE strength OCK & CKC   PT Home Exercise Plan continue HEP from home health, heel-toe gait, standing glut+quad set; hip ext and abd at counter, side stepping at counter   Consulted and Agree with Plan of Care Patient      Patient will benefit from skilled therapeutic intervention in order to improve the following deficits and impairments:     Visit Diagnosis: Acute pain of right knee  Stiffness of right knee, not elsewhere classified  Difficulty in walking, not elsewhere classified       G-Codes - 03/07/16 1412    Functional Assessment Tool Used FOTO 53% ability (goal 56%), clinical judgement   Functional Limitation Mobility: Walking and moving around   Mobility: Walking and Moving Around Current Status [id number]) At least 40 percent but less than 60 percent impaired, limited or restricted   Mobility: Walking  and Moving Around Goal Status [id number]) At least 20 percent but less than 40 percent impaired, limited or restricted  Problem List Patient Active Problem List   Diagnosis Date Noted  . Right knee DJD 11/26/2015  . S/P total knee arthroplasty 04/18/2013  . Unilateral primary osteoarthritis, right knee 04/04/2013    Class: Diagnosis of  . Candidal intertrigo 08/09/2012  . Hypertension 12/15/2011  . Diabetes mellitus ([place]) 12/15/2011    Taysha Majewski C. Izaiah Tabb PT, DPT 02/06/16 2:13 PM   [facility] [address] [place], [place], [address] Phone: [phone]   Fax:  [phone]  Name: Teresa Stanley MRN: [id number] Date of Birth: [birth year]

## 2016-02-08 ENCOUNTER — Ambulatory Visit (INDEPENDENT_AMBULATORY_CARE_PROVIDER_SITE_OTHER): Payer: Medicare Other | Admitting: Orthopaedic Surgery

## 2016-02-12 ENCOUNTER — Ambulatory Visit: Payer: Medicare Other | Attending: Internal Medicine | Admitting: Physical Therapy

## 2016-02-12 DIAGNOSIS — M25661 Stiffness of right knee, not elsewhere classified: Secondary | ICD-10-CM | POA: Diagnosis present

## 2016-02-12 DIAGNOSIS — R262 Difficulty in walking, not elsewhere classified: Secondary | ICD-10-CM | POA: Insufficient documentation

## 2016-02-12 DIAGNOSIS — M25561 Pain in right knee: Secondary | ICD-10-CM | POA: Diagnosis present

## 2016-02-12 NOTE — Therapy (Signed)
[facility] [address] [place], [place], [address] Phone: [phone]   Fax:  [phone]  Physical Therapy Treatment  Patient Details  Name: Teresa Stanley MRN: [id number] Date of Birth: Jun 30, 1945 Referring Provider: Annell Greening, MD  Encounter Date: 02/12/2016      PT End of Session - 02/12/16 1727    Visit Number 5   Number of Visits 13   Date for PT Re-Evaluation 02/29/16   PT Start Time 1332   PT Stop Time 1415   PT Time Calculation (min) 43 min   Activity Tolerance Patient tolerated treatment well   Behavior During Therapy [facility] for tasks assessed/performed      Past Medical History:  Diagnosis Date  . Arthritis   . Diabetes mellitus    takes Actos and Metformin daily and Victoza  . GERD (gastroesophageal reflux disease)    takes Omeprazole daily  . Headache(784.0)    occasionally  . Hyperlipidemia    takes Atorvastatin daily  . Hypertension    takes Lisinopril,Amlodipine,and Metoprolol daily  . Hypothyroidism   . Joint pain   . Thyroid disease    ?    Past Surgical History:  Procedure Laterality Date  . ABDOMINAL HYSTERECTOMY    . BREAST BIOPSY Left   . CARDIAC CATHETERIZATION  2009  . CATARACT EXTRACTION Right   . EYE SURGERY    . JOINT REPLACEMENT    . KNEE ARTHROPLASTY Left 04/04/2013   Procedure: COMPUTER ASSISTED TOTAL KNEE ARTHROPLASTY;  Surgeon: Eldred Manges, MD;  Location[facility];  Service: Orthopedics;  Laterality: Left;  Left Total Knee Arthroplasty, Cemented  . TOTAL KNEE ARTHROPLASTY Right 11/26/2015   Procedure: TOTAL KNEE ARTHROPLASTY;  Surgeon: Eldred Manges, MD;  Location: [facility];  Service: Orthopedics;  Laterality: Right;    There were no vitals filed for this visit.      Subjective Assessment - 02/12/16 1335    Subjective Uses cane with walking and shopping.  Does not usre cane inside her house starting today.    Currently in Pain? Yes   Pain Score --  mild   Pain Location Knee   Pain Orientation Right   Pain Descriptors / Indicators Tightness  mild   Pain Type Surgical pain   Aggravating Factors  random pains , not sure what increases pain.   Pain Relieving Factors  walking, ice.   Multiple Pain Sites No            [facility] PT Assessment - 02/12/16 0001      AROM   Right Knee Extension 0   Right Knee Flexion 108  prior to stretch     PROM   Right Knee Flexion 120     Strength   Right Hip Flexion 3/5  Strength measurements from 12/27   Right Hip Extension 3/5  meas in standing   Right Hip ABduction 3/5  sidelying   Left Hip Flexion 3/5   Left Hip Extension 3+/5  meas in standing   Left Hip ABduction 3/5  sidelying   Right Knee Flexion 5/5   Right Knee Extension 4+/5   Left Knee Flexion 4+/5   Left Knee Extension 5/5                     [facility] Adult PT Treatment/Exercise - 02/12/16 0001      Knee/Hip Exercises: Stretches   Knee: Self-Stretch to increase Flexion 5 reps   Knee: Self-Stretch Limitations with towel  roll and instruction   Gastroc Stretch 3 reps;30 seconds   Gastroc Stretch Limitations incline board, cues initially     Knee/Hip Exercises: Aerobic   Stationary Bike unable to keep bike turned on .consistantly,  5 minutes total.     Knee/Hip Exercises: Standing   Heel Raises 10 reps;2 sets  1 set single leg   SLS with Vectors toe taps with left forward, side  10 x each,  close SBA   Gait Training parallel bars weight shifts, large steps   gait/posture improved with cues.   Other Standing Knee Exercises weight shifts gfacing wall single and double arm and knee lift   10 x each, CGA initrially, fatigues quickly     Knee/Hip Exercises: Seated   Hamstring Curl 1 set;10 reps;Both   Hamstring Limitations red band   Sit to Sand 5 reps  2 esets 6 inch step with 3 inch pad, ball between knees     Knee/Hip Exercises: Supine   Heel Slides 10 reps  with strap assist                PT Education - 02/12/16  1726    Education provided Yes   Education Details gait training   Person(s) Educated Patient   Methods Explanation;Demonstration;Tactile cues;Verbal cues   Comprehension Verbalized understanding;Returned demonstration          PT Short Term Goals - 02/12/16 1730      PT SHORT TERM GOAL #1   Title 0-120 deg ROM in knee by 12/29   Time 3   Period Weeks   Status Achieved     PT SHORT TERM GOAL #2   Title Pt will demo heel-toe gait without cuing   Time 3   Period Weeks   Status Achieved     PT SHORT TERM GOAL #3   Baseline has not returned due to husband being sick   Time 6   Period Weeks   Status On-going           PT Long Term Goals - 02/12/16 1731      PT LONG TERM GOAL #1   Title FOTO to 56% ability to indicate significant functional improvement by 02/29/16   Baseline 53% ability on 12/27   Time 6   Period Weeks   Status Achieved     PT LONG TERM GOAL #2   Title Hip strength to at least 4/5 grossly to provide proximal support to knee   Baseline see flowsheet   Time 6   Period Weeks   Status On-going     PT LONG TERM GOAL #3   Title Pt will demo SLS for at least 3 sec without UE support to indicate necessary balance ability during stance phase of gait   Baseline 2 sec unsteady on 12/27   Time 6   Period Weeks   Status On-going     PT LONG TERM GOAL #4   Title Pt will be able to ambulate around the shopping center with minimal rest breaks required to return to PLOF.    Time 6   Period Weeks   Status Achieved               Plan - 02/12/16 1727    Clinical Impression Statement PROM 120 flexion.  Patient is making gains with strength, ROM and endurance.  She had mild pain at end of session however she declined the need for modalities for pain. She plans to attend 1 x this  week due to needing to take her Husband to a Hospice appointment.  No new goals met.She does not use her cane inside house X 1 day/   PT Next Visit Plan See how the MD visit  went.  Check gait, continue strengthening.   PT Home Exercise Plan continue HEP from home health, heel-toe gait, standing glut+quad set; hip ext and abd at counter, side stepping at counter   Consulted and Agree with Plan of Care Patient      Patient will benefit from skilled therapeutic intervention in order to improve the following deficits and impairments:  Abnormal gait, Decreased range of motion, Difficulty walking, Increased fascial restricitons, Decreased activity tolerance, Pain, Improper body mechanics, Decreased balance, Decreased scar mobility, Decreased strength  Visit Diagnosis: Acute pain of right knee  Stiffness of right knee, not elsewhere classified  Difficulty in walking, not elsewhere classified     Problem List Patient Active Problem List   Diagnosis Date Noted  . Right knee DJD 11/26/2015  . S/P total knee arthroplasty 04/18/2013  . Unilateral primary osteoarthritis, right knee 04/04/2013    Class: Diagnosis of  . Candidal intertrigo 08/09/2012  . Hypertension 12/15/2011  . Diabetes mellitus ([place]) 12/15/2011    HARRIS,KAREN PTA 02/12/2016, 5:33 PM  [facility] [address] [place], [place], [address] Phone: [phone]   Fax:  [phone]  Name: Teresa Stanley MRN: [id number] Date of Birth: Jun 30, 1945

## 2016-02-13 ENCOUNTER — Ambulatory Visit (INDEPENDENT_AMBULATORY_CARE_PROVIDER_SITE_OTHER): Payer: Medicare Other | Admitting: Orthopaedic Surgery

## 2016-02-14 ENCOUNTER — Ambulatory Visit: Payer: Medicare Other | Admitting: Physical Therapy

## 2016-02-19 ENCOUNTER — Ambulatory Visit: Payer: Medicare Other | Admitting: Physical Therapy

## 2016-02-21 ENCOUNTER — Ambulatory Visit: Payer: Medicare Other | Admitting: Physical Therapy

## 2016-02-26 ENCOUNTER — Ambulatory Visit: Payer: Medicare Other | Admitting: Physical Therapy

## 2016-02-26 ENCOUNTER — Encounter (INDEPENDENT_AMBULATORY_CARE_PROVIDER_SITE_OTHER): Payer: Self-pay | Admitting: Orthopaedic Surgery

## 2016-02-26 ENCOUNTER — Ambulatory Visit (INDEPENDENT_AMBULATORY_CARE_PROVIDER_SITE_OTHER): Payer: Medicare Other | Admitting: Orthopaedic Surgery

## 2016-02-26 VITALS — BP 138/85 | HR 84 | Ht 62.0 in | Wt 270.0 lb

## 2016-02-26 DIAGNOSIS — Z96651 Presence of right artificial knee joint: Secondary | ICD-10-CM | POA: Diagnosis not present

## 2016-02-26 NOTE — Progress Notes (Signed)
Office Visit Note   Patient: Teresa Stanley           Date of Birth: 30-Aug-1945           MRN: [id number] Visit Date: 02/26/2016              Requested by: [name], MD [address] [place], [place] [address] PCP: Maximino Greenland, MD   Assessment & Plan: Visit Diagnoses:  1. Status post total right knee replacement     Plan: She patient is finishing up physical therapy she has full extension and full flexion. Her husband had problems with cancer for extended period time she recently had to bury her husband. She did not have therapy last week. She uses a cane with long distance opposite total knee is doing well. I discussed with her that I'm very pleased with excellent work she is done with her total knee and she should be able to resume community ambulation and exercising in the springtime and work on some weight loss and health improvement.  Follow-Up Instructions: No Follow-up on file.   Orders:  No orders of the defined types were placed in this encounter.  No orders of the defined types were placed in this encounter.     Procedures: No procedures performed   Clinical Data: No additional findings.   Subjective: Chief Complaint  Patient presents with  . Right Knee - Routine Post Op, Follow-up    1 month follow up post right total knee arthroplasty 11/26/15. Patient states right knee is doing well. Patient states is continuing physical therapy and doing well. Patient denies any pain or swelling with right knee. Patient is using cane when walking long distances but not in home.  Patient is not taking any medication for pain.    Review of Systems 14 point review of systems updated and is unchanged from surgical Objective: Vital Signs: BP 138/85   Pulse 84   Ht 5\' 2"  (1.575 m)   Wt 270 lb (122.5 kg)   BMI 49.38 kg/m   Physical Exam patient has good quad strength, full extension 0 flexes to 120. Collateral ligaments are stable strong is  mild.  Ortho Exam  Specialty Comments:  No specialty comments available.  Imaging: No results found.   PMFS History: Patient Active Problem List   Diagnosis Date Noted  . Right knee DJD 11/26/2015  . S/P total knee arthroplasty 04/18/2013  . Unilateral primary osteoarthritis, right knee 04/04/2013    Class: Diagnosis of  . Candidal intertrigo 08/09/2012  . Hypertension 12/15/2011  . Diabetes mellitus ([place]) 12/15/2011   Past Medical History:  Diagnosis Date  . Arthritis   . Diabetes mellitus    takes Actos and Metformin daily and Victoza  . GERD (gastroesophageal reflux disease)    takes Omeprazole daily  . Headache(784.0)    occasionally  . Hyperlipidemia    takes Atorvastatin daily  . Hypertension    takes Lisinopril,Amlodipine,and Metoprolol daily  . Hypothyroidism   . Joint pain   . Thyroid disease    ?    Family History  Problem Relation Age of Onset  . Diabetes Mother   . Hypertension Mother   . Diabetes Father   . Asthma Father   . Hypertension Father   . Hypertension Daughter   . Hypertension Sister     2  . Diabetes Sister   . Hypertension Brother     5  . Kidney disease Brother  Past Surgical History:  Procedure Laterality Date  . ABDOMINAL HYSTERECTOMY    . BREAST BIOPSY Left   . CARDIAC CATHETERIZATION  2009  . CATARACT EXTRACTION Right   . EYE SURGERY    . JOINT REPLACEMENT    . KNEE ARTHROPLASTY Left 04/04/2013   Procedure: COMPUTER ASSISTED TOTAL KNEE ARTHROPLASTY;  Surgeon: Marybelle Killings, MD;  Location: [place];  Service: Orthopedics;  Laterality: Left;  Left Total Knee Arthroplasty, Cemented  . TOTAL KNEE ARTHROPLASTY Right 11/26/2015   Procedure: TOTAL KNEE ARTHROPLASTY;  Surgeon: Marybelle Killings, MD;  Location: [place];  Service: Orthopedics;  Laterality: Right;   Social History   Occupational History  . Not on file.   Social History Main Topics  . Smoking status: Never Smoker  . Smokeless tobacco: Never Used  . Alcohol use No   . Drug use: No  . Sexual activity: Yes    Birth control/ protection: Surgical

## 2016-02-28 ENCOUNTER — Ambulatory Visit: Payer: Medicare Other | Admitting: Physical Therapy

## 2016-03-04 ENCOUNTER — Encounter: Payer: Self-pay | Admitting: Physical Therapy

## 2016-03-04 ENCOUNTER — Ambulatory Visit: Payer: Medicare Other | Admitting: Physical Therapy

## 2016-03-04 DIAGNOSIS — M25561 Pain in right knee: Secondary | ICD-10-CM

## 2016-03-04 DIAGNOSIS — R262 Difficulty in walking, not elsewhere classified: Secondary | ICD-10-CM

## 2016-03-04 DIAGNOSIS — M25661 Stiffness of right knee, not elsewhere classified: Secondary | ICD-10-CM

## 2016-03-04 NOTE — Therapy (Signed)
[place], [place], [address] Phone: [phone]   Fax:  [phone]  Physical Therapy Treatment  Patient Details  Name: Teresa Stanley MRN: [id number] Date of Birth: [birth year] Referring Provider: Rodell Perna, MD  Encounter Date: 03/04/2016      PT End of Session - 03/04/16 1724    Visit Number 6   Number of Visits 13   Date for PT Re-Evaluation 02/29/16   PT Start Time 1330   PT Stop Time 1415   PT Time Calculation (min) 45 min   Activity Tolerance Patient tolerated treatment well   Behavior During Therapy [facility] for tasks assessed/performed      Past Medical History:  Diagnosis Date  . Arthritis   . Diabetes mellitus    takes Actos and Metformin daily and Victoza  . GERD (gastroesophageal reflux disease)    takes Omeprazole daily  . Headache(784.0)    occasionally  . Hyperlipidemia    takes Atorvastatin daily  . Hypertension    takes Lisinopril,Amlodipine,and Metoprolol daily  . Hypothyroidism   . Joint pain   . Thyroid disease    ?    Past Surgical History:  Procedure Laterality Date  . ABDOMINAL HYSTERECTOMY    . BREAST BIOPSY Left   . CARDIAC CATHETERIZATION  2009  . CATARACT EXTRACTION Right   . EYE SURGERY    . JOINT REPLACEMENT    . KNEE ARTHROPLASTY Left 04/04/2013   Procedure: COMPUTER ASSISTED TOTAL KNEE ARTHROPLASTY;  Surgeon: Marybelle Killings, MD;  Location: [place];  Service: Orthopedics;  Laterality: Left;  Left Total Knee Arthroplasty, Cemented  . TOTAL KNEE ARTHROPLASTY Right 11/26/2015   Procedure: TOTAL KNEE ARTHROPLASTY;  Surgeon: Marybelle Killings, MD;  Location: [place];  Service: Orthopedics;  Laterality: Right;    There were no vitals filed for this visit.      Subjective Assessment - 03/04/16 1337    Subjective Her husband passed away since her last visit.  Pain 5/10.   I have been trying to walk with heel toe .    MD was happy with hwe knee and he released patient.  She  says her knee is loosening.   Currently in Pain? Yes   Pain Score 5    Pain Location Knee   Pain Orientation Right;Anterior   Pain Descriptors / Indicators Tender   Pain Type Surgical pain   Aggravating Factors  random   Pain Relieving Factors tylenol and ice                         [facility] Adult PT Treatment/Exercise - 03/04/16 0001      Ambulation/Gait   Stairs Yes   Stair Management Technique Two rails;Alternating pattern;Step to pattern;Forwards   Number of Stairs 16   Height of Stairs 4   Gait Comments step to descending     High Level Balance   High Level Balance Comments static and dynamic balance activities in parallel bars  Close SBA     Knee/Hip Exercises: Aerobic   Stationary Bike 5 minutes      Knee/Hip Exercises: Standing   Heel Raises 10 reps   Heel Raises Limitations and   Hip Flexion 10 reps;2 sets  marching in bars   Side Lunges 3 sets  10 feet with red band side steps in parallel bars   Forward Step Up Right;Hand Hold: 2;Step Height: 6"   Forward Step Up Limitations 3  reps in parallel bars   Step Down Right;Hand Hold: 2;Step Height: 6"   Step Down Limitations 3 reps   SLS almost 2 seconds  multiple reps, hip IR and adducts   SLS with Vectors standing right,  sliding pillowcase 3 directions on floor   Close SBA   Other Standing Knee Exercises with red band at knees 10 feet X 3 in bars, lateral steps. with 2 hands     Knee/Hip Exercises: Seated   Hamstring Curl 2 sets;10 reps;Both                  PT Short Term Goals - 03/04/16 1727      PT SHORT TERM GOAL #3   Title Pt will be able to return to church and utilize exercises while seated and standing   Time 6   Period Weeks   Status Unable to assess           PT Long Term Goals - 03/04/16 1728      PT LONG TERM GOAL #1   Title FOTO to 56% ability to indicate significant functional improvement by 02/29/16   Status Achieved     PT LONG TERM GOAL #2   Title Hip  strength to at least 4/5 grossly to provide proximal support to knee   Baseline hip flexion right 4/5, left 4-/5   Time 6   Period Weeks   Status On-going     PT LONG TERM GOAL #3   Title Pt will demo SLS for at least 3 sec without UE support to indicate necessary balance ability during stance phase of gait   Baseline almost 2 seconds   Time 6   Period Weeks   Status On-going     PT LONG TERM GOAL #4   Title Pt will be able to ambulate around the shopping center with minimal rest breaks required to return to PLOF.    Time 6   Period Weeks   Status Achieved               Plan - 03/04/16 1725    Clinical Impression Statement Patient able to ascend step over step , 4 inches consistantly,  descending usually step to pattern in the community.  Patient is to transistion to [facility] when she is ready for discharge.  She thinks she needs 1 more visit.    PT Next Visit Plan One more visit scheduled beyond her POC,  she needs to see her PT next visit.  FOTO?     PT Home Exercise Plan continue HEP from home health, heel-toe gait, standing glut+quad set; hip ext and abd at counter, side stepping at counter   Consulted and Agree with Plan of Care Patient      Patient will benefit from skilled therapeutic intervention in order to improve the following deficits and impairments:  Abnormal gait, Decreased range of motion, Difficulty walking, Increased fascial restricitons, Decreased activity tolerance, Pain, Improper body mechanics, Decreased balance, Decreased scar mobility, Decreased strength  Visit Diagnosis: Acute pain of right knee  Stiffness of right knee, not elsewhere classified  Difficulty in walking, not elsewhere classified     Problem List Patient Active Problem List   Diagnosis Date Noted  . Right knee DJD 11/26/2015  . S/P total knee arthroplasty 04/18/2013  . Unilateral primary osteoarthritis, right knee 04/04/2013    Class: Diagnosis of  . Candidal intertrigo  08/09/2012  . Hypertension 12/15/2011  . Diabetes mellitus ([place]) 12/15/2011  HARRIS,KAREN PTA 03/04/2016, 5:32 PM  [place], [place], [address] Phone: [phone]   Fax:  [phone]  Name: Teresa Stanley MRN: [id number] Date of Birth: 10/24/1945

## 2016-03-06 ENCOUNTER — Ambulatory Visit: Payer: Medicare Other | Admitting: Physical Therapy

## 2016-03-11 ENCOUNTER — Encounter: Payer: Self-pay | Admitting: Physical Therapy

## 2016-03-11 ENCOUNTER — Ambulatory Visit: Payer: Medicare Other | Admitting: Physical Therapy

## 2016-03-11 DIAGNOSIS — M25561 Pain in right knee: Secondary | ICD-10-CM

## 2016-03-11 DIAGNOSIS — M25661 Stiffness of right knee, not elsewhere classified: Secondary | ICD-10-CM

## 2016-03-11 DIAGNOSIS — R262 Difficulty in walking, not elsewhere classified: Secondary | ICD-10-CM

## 2016-03-11 NOTE — Therapy (Signed)
[place], [place], [address] Phone: [phone]   Fax:  [phone]  Physical Therapy Treatment/Discharge Summary  Patient Details  Name: Teresa Stanley MRN: [id number] Date of Birth: 1945/09/03 Referring Provider: Rodell Perna, MD  Encounter Date: 03/11/2016      PT End of Session - 03/11/16 1418    Visit Number 7   Number of Visits 13   Date for PT Re-Evaluation 03/11/16   Authorization Type [employer] MCR- KX at visit 15   PT Start Time 1417   PT Stop Time 1448   PT Time Calculation (min) 31 min   Activity Tolerance Patient limited by fatigue   Behavior During Therapy [facility] for tasks assessed/performed      Past Medical History:  Diagnosis Date  . Arthritis   . Diabetes mellitus    takes Actos and Metformin daily and Victoza  . GERD (gastroesophageal reflux disease)    takes Omeprazole daily  . Headache(784.0)    occasionally  . Hyperlipidemia    takes Atorvastatin daily  . Hypertension    takes Lisinopril,Amlodipine,and Metoprolol daily  . Hypothyroidism   . Joint pain   . Thyroid disease    ?    Past Surgical History:  Procedure Laterality Date  . ABDOMINAL HYSTERECTOMY    . BREAST BIOPSY Left   . CARDIAC CATHETERIZATION  2009  . CATARACT EXTRACTION Right   . EYE SURGERY    . JOINT REPLACEMENT    . KNEE ARTHROPLASTY Left 04/04/2013   Procedure: COMPUTER ASSISTED TOTAL KNEE ARTHROPLASTY;  Surgeon: Marybelle Killings, MD;  Location: [facility];  Service: Orthopedics;  Laterality: Left;  Left Total Knee Arthroplasty, Cemented  . TOTAL KNEE ARTHROPLASTY Right 11/26/2015   Procedure: TOTAL KNEE ARTHROPLASTY;  Surgeon: Marybelle Killings, MD;  Location: [facility];  Service: Orthopedics;  Laterality: Right;    There were no vitals filed for this visit.      Subjective Assessment - 03/11/16 1418    Subjective Pt reports knee is feeling good, denies pain.    Patient Stated Goals shopping, return to church, go to  beach/travel, spend time with grand children   Currently in Pain? No/denies            [facility] PT Assessment - 03/11/16 0001      Assessment   Medical Diagnosis s/p R TKA   Referring Provider Rodell Perna, MD     AROM   Right Knee Extension 0   Right Knee Flexion 120     Strength   Right Hip Flexion 4/5   Right Hip Extension 3+/5  standing   Right Hip ABduction 3+/5  sidelying   Left Hip Flexion 4/5   Left Hip Extension 3+/5  in standing   Left Hip ABduction 3+/5  sidelying   Right Knee Flexion 5/5   Right Knee Extension 5/5   Left Knee Flexion 5/5   Left Knee Extension 5/5                     [facility] Adult PT Treatment/Exercise - 03/11/16 0001      Knee/Hip Exercises: Aerobic   Nustep 5 min L5     Knee/Hip Exercises: Standing   Heel Raises 15 reps   Heel Raises Limitations hand support on counter   Hip Abduction Both;15 reps   Abduction Limitations at counter   Hip Extension Both;10 reps   Extension Limitations elbows on counter   Other Standing Knee  Exercises mini wall slides                  PT Short Term Goals - 03/28/2016 1419      PT SHORT TERM GOAL #1   Title 0-120 deg ROM in knee by 12/29   Baseline see flowsheet   Status Achieved     PT SHORT TERM GOAL #2   Title Pt will demo heel-toe gait without cuing   Baseline able to demo   Status Achieved     PT SHORT TERM GOAL #3   Title Pt will be able to return to church and utilize exercises while seated and standing   Baseline has been back   Status Achieved           PT Long Term Goals - 03/28/2016 1419      PT LONG TERM GOAL #1   Title FOTO to 56% ability to indicate significant functional improvement by 02/29/16   Status Achieved     PT LONG TERM GOAL #2   Title Hip strength to at least 4/5 grossly to provide proximal support to knee   Baseline see flowsheet   Status Partially Met     PT LONG TERM GOAL #3   Title Pt will demo SLS for at least 3 sec without UE  support to indicate necessary balance ability during stance phase of gait   Baseline able on L, R requires assistance   Status Partially Met     PT LONG TERM GOAL #4   Title Pt will be able to ambulate around the shopping center with minimal rest breaks required to return to PLOF.    Status Achieved               Plan - Mar 28, 2016 1448    Clinical Impression Statement  Pt has met goals and verbalized readiness for d/c today. Pt required multiple rest breaks today due to fatigue. Pt continues to lack some strength and was educated on importance of continued exercise to improve strength. Pt verbalized readiness to stop using cane, I advised that she try carrying it with her for a little bit but not letting it touch the ground for safety before completely stopping. Pt was instructed to contact us with any further questions or concerns.    PT Treatment/Interventions ADLs/Self Care Home Management;Cryotherapy;Electrical Stimulation;Functional mobility training;Stair training;Gait training;Moist Heat;Therapeutic activities;Balance training;Therapeutic exercise;Neuromuscular re-education;Patient/family education;Passive range of motion;Scar mobilization;Manual techniques;Taping   Consulted and Agree with Plan of Care Patient      Patient will benefit from skilled therapeutic intervention in order to improve the following deficits and impairments:  Abnormal gait, Decreased range of motion, Difficulty walking, Increased fascial restricitons, Decreased activity tolerance, Pain, Improper body mechanics, Decreased balance, Decreased scar mobility, Decreased strength  Visit Diagnosis: Acute pain of right knee - Plan: PT plan of care cert/re-cert  Stiffness of right knee, not elsewhere classified - Plan: PT plan of care cert/re-cert  Difficulty in walking, not elsewhere classified - Plan: PT plan of care cert/re-cert       G-Codes - Mar 28, 2016 1506    Functional Assessment Tool Used FOTO, clinical  judgement   Functional Limitation Mobility: Walking and moving around   Mobility: Walking and Moving Around Goal Status [phone]) At least 20 percent but less than 40 percent impaired, limited or restricted   Mobility: Walking and Moving Around Discharge Status [phone]) At least 20 percent but less than 40 percent impaired, limited or restricted  Problem List Patient Active Problem List   Diagnosis Date Noted  . Right knee DJD 11/26/2015  . S/P total knee arthroplasty 04/18/2013  . Unilateral primary osteoarthritis, right knee 04/04/2013    Class: Diagnosis of  . Candidal intertrigo 08/09/2012  . Hypertension 12/15/2011  . Diabetes mellitus ([place]) 12/15/2011    PHYSICAL THERAPY DISCHARGE SUMMARY  Visits from Start of Care: 7  Current functional level related to goals / functional outcomes: See above   Remaining deficits: See above   Education / Equipment: Anatomy of condition, POC, HEP, exercise form/rationale  Plan: Patient agrees to discharge.  Patient goals were partially met. Patient is being discharged due to being pleased with the current functional level.  ?????   Verba Ainley C. Metzli Pollick PT, DPT 03/11/16 3:10 PM     [facility] [address] [place], [place], [address] Phone: [phone]   Fax:  [phone]  Name: Teresa Stanley MRN: [id number] Date of Birth: 1945/02/23

## 2016-04-03 ENCOUNTER — Other Ambulatory Visit: Payer: Self-pay | Admitting: Internal Medicine

## 2016-04-03 DIAGNOSIS — Z1231 Encounter for screening mammogram for malignant neoplasm of breast: Secondary | ICD-10-CM

## 2016-04-07 ENCOUNTER — Ambulatory Visit
Admission: RE | Admit: 2016-04-07 | Discharge: 2016-04-07 | Disposition: A | Discharge status: Home / Self Care | Payer: Medicare Other | Source: Ambulatory Visit | Attending: Internal Medicine | Admitting: Internal Medicine

## 2016-04-07 DIAGNOSIS — Z1231 Encounter for screening mammogram for malignant neoplasm of breast: Secondary | ICD-10-CM

## 2016-07-08 ENCOUNTER — Encounter (HOSPITAL_COMMUNITY): Payer: Self-pay

## 2016-07-08 DIAGNOSIS — E039 Hypothyroidism, unspecified: Secondary | ICD-10-CM | POA: Insufficient documentation

## 2016-07-08 DIAGNOSIS — I10 Essential (primary) hypertension: Secondary | ICD-10-CM | POA: Diagnosis not present

## 2016-07-08 DIAGNOSIS — Z79899 Other long term (current) drug therapy: Secondary | ICD-10-CM | POA: Diagnosis not present

## 2016-07-08 DIAGNOSIS — Z7984 Long term (current) use of oral hypoglycemic drugs: Secondary | ICD-10-CM | POA: Diagnosis not present

## 2016-07-08 DIAGNOSIS — E119 Type 2 diabetes mellitus without complications: Secondary | ICD-10-CM | POA: Insufficient documentation

## 2016-07-08 DIAGNOSIS — R42 Dizziness and giddiness: Secondary | ICD-10-CM | POA: Insufficient documentation

## 2016-07-08 DIAGNOSIS — E860 Dehydration: Secondary | ICD-10-CM | POA: Diagnosis not present

## 2016-07-08 DIAGNOSIS — Z96651 Presence of right artificial knee joint: Secondary | ICD-10-CM | POA: Insufficient documentation

## 2016-07-08 MED ORDER — ONDANSETRON 4 MG PO TBDP
4.0000 mg | ORAL_TABLET | Freq: Once | ORAL | Status: AC | PRN
Start: 2016-07-08 — End: 2016-07-08
  Administered 2016-07-08: 4 mg via ORAL
  Filled 2016-07-08: qty 1

## 2016-07-08 NOTE — ED Notes (Signed)
I attempted to collect labs and was unsuccessful. 

## 2016-07-09 ENCOUNTER — Emergency Department (HOSPITAL_COMMUNITY)
Admission: EM | Admit: 2016-07-09 | Discharge: 2016-07-09 | Disposition: A | Discharge status: Home / Self Care | Payer: Medicare Other | Attending: Emergency Medicine | Admitting: Emergency Medicine

## 2016-07-09 DIAGNOSIS — R42 Dizziness and giddiness: Secondary | ICD-10-CM

## 2016-07-09 DIAGNOSIS — E860 Dehydration: Secondary | ICD-10-CM

## 2016-07-09 LAB — URINALYSIS, ROUTINE W REFLEX MICROSCOPIC
Bilirubin Urine: NEGATIVE
Glucose, UA: NEGATIVE mg/dL
Hgb urine dipstick: NEGATIVE
Ketones, ur: NEGATIVE mg/dL
LEUKOCYTES UA: NEGATIVE
Nitrite: NEGATIVE
PROTEIN: NEGATIVE mg/dL
Specific Gravity, Urine: 1.014 (ref 1.005–1.030)
pH: 5 (ref 5.0–8.0)

## 2016-07-09 LAB — I-STAT CHEM 8, ED
BUN: 40 mg/dL — ABNORMAL HIGH (ref 6–20)
CALCIUM ION: 1.16 mmol/L (ref 1.15–1.40)
CHLORIDE: 107 mmol/L (ref 101–111)
Creatinine, Ser: 1.4 mg/dL — ABNORMAL HIGH (ref 0.44–1.00)
Glucose, Bld: 162 mg/dL — ABNORMAL HIGH (ref 65–99)
HCT: 31 % — ABNORMAL LOW (ref 36.0–46.0)
Hemoglobin: 10.5 g/dL — ABNORMAL LOW (ref 12.0–15.0)
POTASSIUM: 5.5 mmol/L — AB (ref 3.5–5.1)
SODIUM: 138 mmol/L (ref 135–145)
TCO2: 25 mmol/L (ref 0–100)

## 2016-07-09 LAB — COMPREHENSIVE METABOLIC PANEL
ALT: 12 U/L — ABNORMAL LOW (ref 14–54)
AST: 19 U/L (ref 15–41)
Albumin: 3.7 g/dL (ref 3.5–5.0)
Alkaline Phosphatase: 97 U/L (ref 38–126)
Anion gap: 8 (ref 5–15)
BUN: 35 mg/dL — ABNORMAL HIGH (ref 6–20)
CHLORIDE: 105 mmol/L (ref 101–111)
CO2: 26 mmol/L (ref 22–32)
Calcium: 9.5 mg/dL (ref 8.9–10.3)
Creatinine, Ser: 1.49 mg/dL — ABNORMAL HIGH (ref 0.44–1.00)
GFR calc non Af Amer: 35 mL/min — ABNORMAL LOW (ref >60)
GFR, EST AFRICAN AMERICAN: 40 mL/min — AB (ref >60)
Glucose, Bld: 181 mg/dL — ABNORMAL HIGH (ref 65–99)
POTASSIUM: 5.9 mmol/L — AB (ref 3.5–5.1)
Sodium: 139 mmol/L (ref 135–145)
Total Bilirubin: 0.5 mg/dL (ref 0.3–1.2)
Total Protein: 7.2 g/dL (ref 6.5–8.1)

## 2016-07-09 LAB — CBC
HCT: 33.6 % — ABNORMAL LOW (ref 36.0–46.0)
Hemoglobin: 10.4 g/dL — ABNORMAL LOW (ref 12.0–15.0)
MCH: 29.2 pg (ref 26.0–34.0)
MCHC: 31 g/dL (ref 30.0–36.0)
MCV: 94.4 fL (ref 78.0–100.0)
PLATELETS: 290 10*3/uL (ref 150–400)
RBC: 3.56 MIL/uL — AB (ref 3.87–5.11)
RDW: 14.8 % (ref 11.5–15.5)
WBC: 8.6 10*3/uL (ref 4.0–10.5)

## 2016-07-09 LAB — LIPASE, BLOOD: Lipase: 25 U/L (ref 11–51)

## 2016-07-09 MED ORDER — MECLIZINE HCL 25 MG PO TABS: 25.0000 mg | ORAL_TABLET | Freq: Three times a day (TID) | ORAL | 0 refills | Status: DC | PRN

## 2016-07-09 MED ORDER — ONDANSETRON HCL 4 MG/2ML IJ SOLN
4.0000 mg | Freq: Once | INTRAMUSCULAR | Status: AC
  Administered 2016-07-09: 4 mg via INTRAVENOUS
  Filled 2016-07-09: qty 2

## 2016-07-09 MED ORDER — MECLIZINE HCL 25 MG PO TABS
25.0000 mg | ORAL_TABLET | Freq: Once | ORAL | Status: AC
  Administered 2016-07-09: 25 mg via ORAL
  Filled 2016-07-09: qty 1

## 2016-07-09 MED ORDER — SODIUM CHLORIDE 0.9 % IV BOLUS (SEPSIS)
1000.0000 mL | Freq: Once | INTRAVENOUS | Status: AC
  Administered 2016-07-09: 1000 mL via INTRAVENOUS

## 2016-07-09 NOTE — ED Provider Notes (Addendum)
[facility] DEPT Provider Note   CSN: [id number] Arrival date & time: 07/08/16  2240  By signing my name below, I, Teresa Stanley, attest that this documentation has been prepared under the direction and in the presence of Teresa Stanley, Teresa Hair, MD. Electronically Signed: Collene Stanley, Scribe. 07/09/16. 1:39 AM.  History   Chief Complaint Chief Complaint  Patient presents with  . Emesis  . Diarrhea   HPI Comments: Teresa Stanley is a 71 y.o. female with a history of DM, GERD, HTN, and HLD, who presents to the Emergency Department complaining of sudden-onset, intermittent dizziness that began earlier today. Patient states when she went into the bedroom to go to sleep th bed felt as if it was spinning. Patient denies any loss of consciousness, numbness, weakness, difficulty ambulating. Patient reports drinking gingerale and vomiting afterwards.Patient states her primary provider has advised her to drink more fluids. Patient reports 1 episode of associated diarrhea and light headedness. No medications taken prior to arrival. Patient denies any chest pain or shortness of breath.    Past Medical History:  Diagnosis Date  . Arthritis   . Diabetes mellitus    takes Actos and Metformin daily and Victoza  . GERD (gastroesophageal reflux disease)    takes Omeprazole daily  . Headache(784.0)    occasionally  . Hyperlipidemia    takes Atorvastatin daily  . Hypertension    takes Lisinopril,Amlodipine,and Metoprolol daily  . Hypothyroidism   . Joint pain   . Thyroid disease    ?    Patient Active Problem List   Diagnosis Date Noted  . Right knee DJD 11/26/2015  . S/P total knee arthroplasty 04/18/2013  . Unilateral primary osteoarthritis, right knee 04/04/2013    Class: Diagnosis of  . Candidal intertrigo 08/09/2012  . Hypertension 12/15/2011  . Diabetes mellitus ([place]) 12/15/2011    Past Surgical History:  Procedure Laterality Date  . ABDOMINAL HYSTERECTOMY    . BREAST BIOPSY  Left   . CARDIAC CATHETERIZATION  2009  . CATARACT EXTRACTION Right   . EYE SURGERY    . JOINT REPLACEMENT    . KNEE ARTHROPLASTY Left 04/04/2013   Procedure: COMPUTER ASSISTED TOTAL KNEE ARTHROPLASTY;  Surgeon: Teresa Killings, MD;  Location: [facility];  Service: Orthopedics;  Laterality: Left;  Left Total Knee Arthroplasty, Cemented  . TOTAL KNEE ARTHROPLASTY Right 11/26/2015   Procedure: TOTAL KNEE ARTHROPLASTY;  Surgeon: Teresa Killings, MD;  Location: [facility];  Service: Orthopedics;  Laterality: Right;    OB History    No data available       Home Medications    Prior to Admission medications   Medication Sig Start Date End Date Taking? Authorizing Provider  amLODipine (NORVASC) 10 MG tablet Take 10 mg by mouth daily.    [provider]  aspirin EC 325 MG tablet Take 1 tablet (325 mg total) by mouth daily. 11/29/15   Teresa Crumbly, PA-C  atorvastatin (LIPITOR) 20 MG tablet Take 20 mg by mouth daily.    [provider]  carboxymethylcellulose (REFRESH PLUS) 0.5 % SOLN Place 1 drop into both eyes daily.    [provider]  HYDROcodone-acetaminophen (NORCO) 5-325 MG tablet Take 1 tablet by mouth daily. As needed for pain 12/21/15   Teresa Killings, MD  lisinopril (PRINIVIL,ZESTRIL) 20 MG tablet Take 1 tablet (20 mg total) by mouth 2 (two) times daily. 12/15/11   Teresa Knapp, MD  meclizine (ANTIVERT) 25 MG tablet Take 1 tablet (25 mg  total) by mouth 3 (three) times daily as needed for dizziness. 07/09/16   Teresa Stanley, Teresa Hair, MD  metFORMIN (GLUCOPHAGE) 1000 MG tablet Take 1,000 mg by mouth 2 (two) times daily with a meal.    [provider]  methocarbamol (ROBAXIN) 500 MG tablet Take 1 tablet (500 mg total) by mouth every 6 (six) hours as needed for muscle spasms. Patient not taking: Reported on 02/26/2016 11/29/15   Teresa Crumbly, PA-C  omeprazole (PRILOSEC) 20 MG capsule Take 20 mg by mouth daily.    [provider]  oxyCODONE-acetaminophen (PERCOCET)  7.5-325 MG tablet Take 1 tablet by mouth every 4 (four) hours as needed for severe pain. Patient not taking: Reported on 02/26/2016 11/29/15   Teresa Crumbly, PA-C  pioglitazone (ACTOS) 45 MG tablet Take 45 mg by mouth every morning. 10/25/15   [provider]  propranolol ER (INDERAL LA) 120 MG 24 hr capsule Take 120 mg by mouth every morning. 10/23/15   [provider]  triamcinolone cream (KENALOG) 0.1 % 1 application daily as needed. 08/06/15   [provider]  triamterene-hydrochlorothiazide (MAXZIDE-25) 37.5-25 MG tablet Take 1 tablet by mouth every morning. 10/23/15   [provider]  Vitamin D, Ergocalciferol, (DRISDOL) 50000 units CAPS capsule Take 50,000 Units by mouth 2 (two) times a week. Tuesday and Friday 10/05/15   [provider]    Family History Family History  Problem Relation Age of Onset  . Diabetes Mother   . Hypertension Mother   . Diabetes Father   . Asthma Father   . Hypertension Father   . Hypertension Daughter   . Hypertension Sister        2  . Diabetes Sister   . Hypertension Brother        5  . Kidney disease Brother     Social History Social History  Substance Use Topics  . Smoking status: Never Smoker  . Smokeless tobacco: Never Used  . Alcohol use No     Allergies   Patient has no known allergies.   Review of Systems Review of Systems  Constitutional: Negative for fever.  Respiratory: Negative for shortness of breath.   Cardiovascular: Negative for chest pain.  Gastrointestinal: Positive for diarrhea, nausea and vomiting. Negative for abdominal pain.  Genitourinary: Negative for dysuria.  Neurological: Positive for dizziness. Negative for syncope and weakness.  All other systems reviewed and are negative.    Physical Exam Updated Vital Signs BP (!) 144/91   Pulse 83   Temp 97.7 F (36.5 C) (Oral)   Resp 17   SpO2 96%   Physical Exam  Constitutional: She is oriented to person, place,  and time. No distress.  Morbidly obese  HENT:  Head: Normocephalic and atraumatic.  Mucous membranes dry  Eyes: Pupils are equal, round, and reactive to light.  Neck: Normal range of motion. Neck supple.  Cardiovascular: Normal rate, regular rhythm and normal heart sounds.   No murmur heard. Pulmonary/Chest: Effort normal and breath sounds normal. No respiratory distress. She has no wheezes.  Abdominal: Soft. Bowel sounds are normal. There is no tenderness. There is no guarding.  Musculoskeletal: She exhibits edema.  Neurological: She is alert and oriented to person, place, and time.  Cranial nerves II through XII intact, 5 out of 5 strength in all 4 extremities, no dysmetria to finger-nose-finger  Skin: Skin is warm and dry.  Psychiatric: She has a normal mood and affect.  Nursing note and vitals reviewed.  ED Treatments / Results  DIAGNOSTIC STUDIES: Oxygen Saturation is 94% on RA, adequate by my interpretation.    COORDINATION OF CARE: 1:38 AM Discussed treatment plan with pt at bedside and pt agreed to plan.  Labs (all labs ordered are listed, but only abnormal results are displayed) Labs Reviewed  COMPREHENSIVE METABOLIC PANEL - Abnormal; Notable for the following:       Result Value   Potassium 5.9 (*)    Glucose, Bld 181 (*)    BUN 35 (*)    Creatinine, Ser 1.49 (*)    ALT 12 (*)    GFR calc non Af Amer 35 (*)    GFR calc Af Amer 40 (*)    All other components within normal limits  CBC - Abnormal; Notable for the following:    RBC 3.56 (*)    Hemoglobin 10.4 (*)    HCT 33.6 (*)    All other components within normal limits  I-STAT CHEM 8, ED - Abnormal; Notable for the following:    Potassium 5.5 (*)    BUN 40 (*)    Creatinine, Ser 1.40 (*)    Glucose, Bld 162 (*)    Hemoglobin 10.5 (*)    HCT 31.0 (*)    All other components within normal limits  LIPASE, BLOOD  URINALYSIS, ROUTINE W REFLEX MICROSCOPIC    EKG  EKG Interpretation None      ED ECG  REPORT   Date: 07/09/2016  Rate: 82  Rhythm: normal sinus rhythm  QRS Axis: left  Intervals: normal  ST/T Wave abnormalities: normal  Conduction Disutrbances:none  Narrative Interpretation:   Old EKG Reviewed: unchanged  I have personally reviewed the EKG tracing and agree with the computerized printout as noted.  Radiology No results found.  Procedures Procedures (including critical care time)  Medications Ordered in ED Medications  ondansetron (ZOFRAN-ODT) disintegrating tablet 4 mg (4 mg Oral Given 07/08/16 2311)  ondansetron (ZOFRAN) injection 4 mg (4 mg Intravenous Given 07/09/16 0250)  sodium chloride 0.9 % bolus 1,000 mL (0 mLs Intravenous Stopped 07/09/16 0400)  meclizine (ANTIVERT) tablet 25 mg (25 mg Oral Given 07/09/16 0250)     Initial Impression / Assessment and Plan / ED Course  I have reviewed the triage vital signs and the nursing notes.  Pertinent labs & imaging results that were available during my care of the patient were reviewed by me and considered in my medical decision making (see chart for details).     Patient presents with dizziness. One episode of vomiting and diarrhea following onset of dizziness. She describes the dizziness as room spinning. She states some improvement. She is nontoxic on exam. Vital signs reassuring. She does appear somewhat clinically dry and states history of dehydration. She is neurologically intact and no signs of cerebellar dysfunction. Patient was given fluids and meclizine. Lab work is largely at patient's baseline. Of note, initial potassium 5.9 with no EKG changes. Patient is a difficult stick and suspect hemolysis. Repeat potassium was obtained and is 5.5. Again without EKG changes or worsening renal function, suspect that this is related to hemolysis. We'll have patient follow-up with her primary physician for recheck.  Patient is able to tolerate fluids and ambulate independently. Will discharge home with primary  follow-up.  After history, exam, and medical workup I feel the patient has been appropriately medically screened and is safe for discharge home. Pertinent diagnoses were discussed with the patient. Patient was given return precautions.   Final Clinical Impressions(s) /  ED Diagnoses   Final diagnoses:  Dehydration  Dizziness    New Prescriptions New Prescriptions   MECLIZINE (ANTIVERT) 25 MG TABLET    Take 1 tablet (25 mg total) by mouth 3 (three) times daily as needed for dizziness.   I personally performed the services described in this documentation, which was scribed in my presence. The recorded information has been reviewed and is accurate.    Merryl Hacker, MD 07/09/16 6734    Merryl Hacker, MD 07/09/16 [phone]

## 2016-07-09 NOTE — Discharge Instructions (Signed)
You were seen today for dizziness. You appeared somewhat dehydrated but also may have some element of vertigo. He'll be given meclizine at home. Follow-up with her primary physician. You need to have repeat lab work to ensure potassium within normal range.

## 2016-09-02 ENCOUNTER — Other Ambulatory Visit: Payer: Self-pay | Admitting: Internal Medicine

## 2016-09-02 DIAGNOSIS — E2839 Other primary ovarian failure: Secondary | ICD-10-CM

## 2016-09-17 ENCOUNTER — Ambulatory Visit
Admission: RE | Admit: 2016-09-17 | Discharge: 2016-09-17 | Disposition: A | Discharge status: Home / Self Care | Payer: Medicare Other | Source: Ambulatory Visit | Attending: Internal Medicine | Admitting: Internal Medicine

## 2016-09-17 DIAGNOSIS — E2839 Other primary ovarian failure: Secondary | ICD-10-CM

## 2017-02-24 ENCOUNTER — Ambulatory Visit (INDEPENDENT_AMBULATORY_CARE_PROVIDER_SITE_OTHER): Payer: Medicare Other

## 2017-02-24 ENCOUNTER — Encounter (INDEPENDENT_AMBULATORY_CARE_PROVIDER_SITE_OTHER): Payer: Self-pay | Admitting: Orthopaedic Surgery

## 2017-02-24 ENCOUNTER — Ambulatory Visit (INDEPENDENT_AMBULATORY_CARE_PROVIDER_SITE_OTHER): Payer: Medicare Other | Admitting: Orthopaedic Surgery

## 2017-02-24 VITALS — BP 158/97 | HR 88 | Ht 63.0 in | Wt 278.0 lb

## 2017-02-24 DIAGNOSIS — M25562 Pain in left knee: Secondary | ICD-10-CM | POA: Diagnosis not present
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
## 2017-03-03 ENCOUNTER — Other Ambulatory Visit: Payer: Self-pay | Admitting: Internal Medicine

## 2017-03-03 DIAGNOSIS — Z1231 Encounter for screening mammogram for malignant neoplasm of breast: Secondary | ICD-10-CM

## 2017-04-09 ENCOUNTER — Ambulatory Visit
Admission: RE | Admit: 2017-04-09 | Discharge: 2017-04-09 | Disposition: A | Discharge status: Home / Self Care | Payer: Medicare Other | Source: Ambulatory Visit | Attending: Internal Medicine | Admitting: Internal Medicine

## 2017-04-09 DIAGNOSIS — Z1231 Encounter for screening mammogram for malignant neoplasm of breast: Secondary | ICD-10-CM

## 2017-04-10 ENCOUNTER — Other Ambulatory Visit: Payer: Self-pay | Admitting: Internal Medicine

## 2017-04-10 DIAGNOSIS — R928 Other abnormal and inconclusive findings on diagnostic imaging of breast: Secondary | ICD-10-CM

## 2017-04-14 ENCOUNTER — Ambulatory Visit
Admission: RE | Admit: 2017-04-14 | Discharge: 2017-04-14 | Disposition: A | Discharge status: Home / Self Care | Payer: Medicare Other | Source: Ambulatory Visit | Attending: Internal Medicine | Admitting: Internal Medicine

## 2017-04-14 ENCOUNTER — Other Ambulatory Visit: Payer: Self-pay | Admitting: Internal Medicine

## 2017-04-14 DIAGNOSIS — R921 Mammographic calcification found on diagnostic imaging of breast: Secondary | ICD-10-CM

## 2017-04-14 DIAGNOSIS — R928 Other abnormal and inconclusive findings on diagnostic imaging of breast: Secondary | ICD-10-CM

## 2017-08-11 DIAGNOSIS — I129 Hypertensive chronic kidney disease with stage 1 through stage 4 chronic kidney disease, or unspecified chronic kidney disease: Secondary | ICD-10-CM | POA: Insufficient documentation

## 2017-08-11 LAB — HEPATIC FUNCTION PANEL
ALT: 10 (ref 7–35)
AST: 14 (ref 13–35)
Alkaline Phosphatase: 115 (ref 25–125)
BILIRUBIN, TOTAL: 0.3

## 2017-08-11 LAB — LIPID PANEL
Cholesterol: 156 (ref 0–200)
HDL: 65 (ref 35–70)
LDL Cholesterol: 65
LDL/HDL RATIO: 0.9
Triglycerides: 98 (ref 40–160)

## 2017-08-11 LAB — BASIC METABOLIC PANEL
BUN: 19 (ref 4–21)
CREATININE: 1.5 — AB (ref 0.5–1.1)
Glucose: 143
Potassium: 5.7 — AB (ref 3.4–5.3)
SODIUM: 142 (ref 137–147)

## 2017-08-11 LAB — HEMOGLOBIN A1C: Hemoglobin A1C: 7.6 % — AB (ref 4.0–5.6)

## 2017-10-16 ENCOUNTER — Ambulatory Visit
Admission: RE | Admit: 2017-10-16 | Discharge: 2017-10-16 | Disposition: A | Discharge status: Home / Self Care | Payer: Medicare Other | Source: Ambulatory Visit | Attending: Internal Medicine | Admitting: Internal Medicine

## 2017-10-16 ENCOUNTER — Other Ambulatory Visit: Payer: Self-pay | Admitting: Internal Medicine

## 2017-10-16 DIAGNOSIS — R921 Mammographic calcification found on diagnostic imaging of breast: Secondary | ICD-10-CM

## 2017-11-07 ENCOUNTER — Encounter: Payer: Self-pay | Admitting: Internal Medicine

## 2017-11-07 DIAGNOSIS — N289 Disorder of kidney and ureter, unspecified: Secondary | ICD-10-CM

## 2017-11-12 ENCOUNTER — Ambulatory Visit (INDEPENDENT_AMBULATORY_CARE_PROVIDER_SITE_OTHER): Payer: Medicare Other | Admitting: Internal Medicine

## 2017-11-12 ENCOUNTER — Encounter: Payer: Self-pay | Admitting: Internal Medicine

## 2017-11-12 VITALS — BP 124/82 | HR 85 | Temp 97.8°F | Ht 62.0 in | Wt 275.4 lb

## 2017-11-12 DIAGNOSIS — Z86010 Personal history of colonic polyps: Secondary | ICD-10-CM | POA: Diagnosis not present

## 2017-11-12 DIAGNOSIS — I10 Essential (primary) hypertension: Secondary | ICD-10-CM

## 2017-11-12 DIAGNOSIS — Z23 Encounter for immunization: Secondary | ICD-10-CM

## 2017-11-12 DIAGNOSIS — R194 Change in bowel habit: Secondary | ICD-10-CM

## 2017-11-12 DIAGNOSIS — E1165 Type 2 diabetes mellitus with hyperglycemia: Secondary | ICD-10-CM | POA: Diagnosis not present

## 2017-11-12 LAB — CBC
HEMATOCRIT: 35.9 % (ref 34.0–46.6)
Hemoglobin: 11 g/dL — ABNORMAL LOW (ref 11.1–15.9)
MCH: 27.6 pg (ref 26.6–33.0)
MCHC: 30.6 g/dL — AB (ref 31.5–35.7)
MCV: 90 fL (ref 79–97)
Platelets: 308 10*3/uL (ref 150–450)
RBC: 3.99 x10E6/uL (ref 3.77–5.28)
RDW: 14.4 % (ref 12.3–15.4)
WBC: 7.6 10*3/uL (ref 3.4–10.8)

## 2017-11-12 LAB — CMP14 + ANION GAP
A/G RATIO: 1.5 (ref 1.2–2.2)
ALK PHOS: 128 IU/L — AB (ref 39–117)
ALT: 8 IU/L (ref 0–32)
AST: 10 IU/L (ref 0–40)
Albumin: 4.3 g/dL (ref 3.5–4.8)
Anion Gap: 18 mmol/L (ref 10.0–18.0)
BILIRUBIN TOTAL: 0.5 mg/dL (ref 0.0–1.2)
BUN/Creatinine Ratio: 17 (ref 12–28)
BUN: 24 mg/dL (ref 8–27)
CALCIUM: 8.7 mg/dL (ref 8.7–10.3)
CHLORIDE: 99 mmol/L (ref 96–106)
CO2: 21 mmol/L (ref 20–29)
Creatinine, Ser: 1.43 mg/dL — ABNORMAL HIGH (ref 0.57–1.00)
GFR calc Af Amer: 43 mL/min/{1.73_m2} — ABNORMAL LOW (ref >59)
GFR, EST NON AFRICAN AMERICAN: 37 mL/min/{1.73_m2} — AB (ref >59)
GLOBULIN, TOTAL: 2.8 g/dL (ref 1.5–4.5)
Glucose: 212 mg/dL — ABNORMAL HIGH (ref 65–99)
POTASSIUM: 5.1 mmol/L (ref 3.5–5.2)
SODIUM: 138 mmol/L (ref 134–144)
Total Protein: 7.1 g/dL (ref 6.0–8.5)

## 2017-11-12 LAB — LIPID PANEL
CHOLESTEROL TOTAL: 165 mg/dL (ref 100–199)
Chol/HDL Ratio: 2.6 ratio (ref 0.0–4.4)
HDL: 64 mg/dL (ref >39)
LDL Calculated: 79 mg/dL (ref 0–99)
TRIGLYCERIDES: 112 mg/dL (ref 0–149)
VLDL Cholesterol Cal: 22 mg/dL (ref 5–40)

## 2017-11-12 LAB — HEMOGLOBIN A1C
ESTIMATED AVERAGE GLUCOSE: 166 mg/dL
Hgb A1c MFr Bld: 7.4 % — ABNORMAL HIGH (ref 4.8–5.6)

## 2017-11-12 MED ORDER — SEMAGLUTIDE(0.25 OR 0.5MG/DOS) 2 MG/1.5ML ~~LOC~~ SOPN: 0.5000 mg | PEN_INJECTOR | SUBCUTANEOUS | 0 refills | Status: DC

## 2017-11-12 NOTE — Progress Notes (Signed)
Subjective:     Patient ID: Teresa Stanley , female    DOB: 02/10/1946 , 72 y.o.   MRN: [id number]   HPI   Pt is here for DM FU, states her average glucose has been 96. She would like to get off the Ozempic, has not had any side effects.     Past Medical History:  Diagnosis Date  . Arthritis   . Diabetes mellitus    takes Actos and Metformin daily and Victoza  . GERD (gastroesophageal reflux disease)    takes Omeprazole daily  . Headache(784.0)    occasionally  . Hyperlipidemia    takes Atorvastatin daily  . Hypertension    takes Lisinopril,Amlodipine,and Metoprolol daily  . Hypothyroidism   . Joint pain   . Thyroid disease    ?      Current Outpatient Medications:  .  aspirin 81 MG tablet, Take 81 mg by mouth daily., Disp: , Rfl:  .  amLODipine (NORVASC) 10 MG tablet, Take 10 mg by mouth daily., Disp: , Rfl:  .  atorvastatin (LIPITOR) 20 MG tablet, Take 20 mg by mouth daily., Disp: , Rfl:  .  carboxymethylcellulose (REFRESH PLUS) 0.5 % SOLN, Place 1 drop into both eyes daily., Disp: , Rfl:  .  meclizine (ANTIVERT) 25 MG tablet, Take 1 tablet (25 mg total) by mouth 3 (three) times daily as needed for dizziness., Disp: 30 tablet, Rfl: 0 .  metFORMIN (GLUCOPHAGE) 1000 MG tablet, Take 1,000 mg by mouth 2 (two) times daily with a meal., Disp: , Rfl:  .  methimazole (TAPAZOLE) 5 MG tablet, Take 5 mg by mouth daily., Disp: , Rfl:  .  omeprazole (PRILOSEC) 20 MG capsule, Take 20 mg by mouth daily., Disp: , Rfl:  .  propranolol ER (INDERAL LA) 120 MG 24 hr capsule, Take 120 mg by mouth every morning., Disp: , Rfl:  .  triamcinolone cream (KENALOG) 0.1 %, 1 application daily as needed., Disp: , Rfl:  .  triamterene-hydrochlorothiazide (MAXZIDE-25) 37.5-25 MG tablet, Take 1 tablet by mouth every morning., Disp: , Rfl:    Review of Systems  Constitutional: Positive for appetite change. Negative for fatigue.  Respiratory: Negative for chest tightness and shortness of breath.    Cardiovascular: Negative for chest pain, palpitations and leg swelling.  Gastrointestinal: Positive for diarrhea.       2-3 loose stools with bloating off and on x 1 month. Has noticed this happened in the past when she ate cabbage and she did eat this on the weekend when she had another spell.  No blood in stools. Her last colonoscopy 2015 showed polyps.   Endocrine: Negative for polydipsia and polyphagia.  Genitourinary: Negative for dysuria.     Today's Vitals   11/12/17 1112  BP: (!) 150/80  Pulse: 85  Temp: 97.8 F (36.6 C)  TempSrc: Oral  SpO2: 95%  Weight: 275 lb 6.4 oz (124.9 kg)   Body mass index is 50.37 kg/m.   Objective:  Physical Exam  Constitutional: She is oriented to person, place, and time. She appears well-developed and well-nourished.  HENT:  Head: Normocephalic.  Right Ear: External ear normal.  Left Ear: External ear normal.  Nose: Nose normal.  Neck: Normal range of motion. Neck supple. No thyromegaly present.  No carotid bruits  Cardiovascular: Normal rate and normal heart sounds.  No murmur heard. Pulmonary/Chest: Effort normal and breath sounds normal.  Musculoskeletal: She exhibits no edema.  Lymphadenopathy:    She has no  cervical adenopathy.  Neurological: She is alert and oriented to person, place, and time.  Skin: Skin is dry. Capillary refill takes less than 2 seconds. She is not diaphoretic. No erythema.  Psychiatric: She has a normal mood and affect. Her behavior is normal. Judgment and thought content normal.  Vitals reviewed.       Assessment And Plan:     1. Need for influenza vaccination  - Flu vaccine HIGH DOSE PF (Fluzone High dose)   2- Dm type 2- chronic- HGBA1C ordered  3- Bowel changes- colonoscopy ordered.  4- HTN- not in good control      After being educated how Ozempic is helping her loose wt and control her DM better and helping her decrease cardiovascular risks, she was willing to continue. I gave her another pen  sample. Labs were ordered as noted. Fu in 3 months.        Camila Norville RODRIGUEZ-SOUTHWORTH, PA-C

## 2017-12-01 ENCOUNTER — Other Ambulatory Visit: Payer: Self-pay | Admitting: Internal Medicine

## 2017-12-15 ENCOUNTER — Encounter: Payer: Self-pay | Admitting: Gastroenterology

## 2017-12-15 ENCOUNTER — Ambulatory Visit: Payer: Medicare Other | Admitting: Gastroenterology

## 2017-12-15 VITALS — BP 116/78 | HR 52 | Ht 62.0 in | Wt 271.0 lb

## 2017-12-15 DIAGNOSIS — R197 Diarrhea, unspecified: Secondary | ICD-10-CM

## 2017-12-15 DIAGNOSIS — R1310 Dysphagia, unspecified: Secondary | ICD-10-CM | POA: Diagnosis not present

## 2017-12-15 DIAGNOSIS — K219 Gastro-esophageal reflux disease without esophagitis: Secondary | ICD-10-CM | POA: Diagnosis not present

## 2017-12-15 NOTE — Progress Notes (Signed)
History of Present Illness: This is a 72 year old referred by Rodriguez-Southworth, S* PA-C for the evaluation of diarrhea and dysphagia.  She is accompanied by her daughter.  Patient relates intermittent solid food dysphagia for 1 month.  She has had long-term GERD which has been well controlled on daily omeprazole.  She notes intermittent diarrhea for the past several years that she feels is related to food intolerances. Denies weight loss, abdominal pain, constipation, change in stool caliber, melena, hematochezia, nausea, vomiting, reflux symptoms, chest pain.   Colonoscopy 04/2014 Left colon diverticulosis, hyperplastic polyp  No Known Allergies Outpatient Medications Prior to Visit  Medication Sig Dispense Refill  . amLODipine (NORVASC) 10 MG tablet Take 10 mg by mouth daily.    [name] aspirin 81 MG tablet Take 81 mg by mouth daily.    [name] atorvastatin (LIPITOR) 20 MG tablet Take 20 mg by mouth daily.    . carboxymethylcellulose (REFRESH PLUS) 0.5 % SOLN Place 1 drop into both eyes daily.    . meclizine (ANTIVERT) 25 MG tablet Take 1 tablet (25 mg total) by mouth 3 (three) times daily as needed for dizziness. 30 tablet 0  . metFORMIN (GLUCOPHAGE) 1000 MG tablet Take 1,000 mg by mouth 2 (two) times daily with a meal.    . methimazole (TAPAZOLE) 5 MG tablet TAKE 1 TABLET BY MOUTH EVERY DAY 30 tablet 2  . omeprazole (PRILOSEC) 20 MG capsule Take 20 mg by mouth daily.    . propranolol ER (INDERAL LA) 120 MG 24 hr capsule Take 120 mg by mouth every morning.    . Semaglutide,0.25 or 0.5MG /DOS, (OZEMPIC, 0.25 OR 0.5 MG/DOSE,) 2 MG/1.5ML SOPN Inject into the skin. Sample pen given to pt    . Semaglutide,0.25 or 0.5MG /DOS, (OZEMPIC, 0.25 OR 0.5 MG/DOSE,) 2 MG/1.5ML SOPN Inject 0.5 mg into the skin once a week. 1 pen 0  . triamcinolone cream (KENALOG) 0.1 % 1 application daily as needed.    . triamterene-hydrochlorothiazide (MAXZIDE-25) 37.5-25 MG tablet TAKE 1 TABLET BY MOUTH EVERY DAY IN THE MORNING  30 tablet 5   No facility-administered medications prior to visit.    Past Medical History:  Diagnosis Date  . Arthritis   . Diabetes mellitus    takes Actos and Metformin daily and Victoza  . GERD (gastroesophageal reflux disease)    takes Omeprazole daily  . Headache(784.0)    occasionally  . Hyperlipidemia    takes Atorvastatin daily  . Hypertension    takes Lisinopril,Amlodipine,and Metoprolol daily  . Hypothyroidism   . Joint pain   . Thyroid disease    ?   Past Surgical History:  Procedure Laterality Date  . ABDOMINAL HYSTERECTOMY    . BREAST BIOPSY Left   . CARDIAC CATHETERIZATION  2009  . CATARACT EXTRACTION Right   . EYE SURGERY    . JOINT REPLACEMENT    . KNEE ARTHROPLASTY Left 04/04/2013   Procedure: COMPUTER ASSISTED TOTAL KNEE ARTHROPLASTY;  Surgeon: Marybelle Killings, MD;  Location: [facility];  Service: Orthopedics;  Laterality: Left;  Left Total Knee Arthroplasty, Cemented  . TOTAL KNEE ARTHROPLASTY Right 11/26/2015   Procedure: TOTAL KNEE ARTHROPLASTY;  Surgeon: Marybelle Killings, MD;  Location: [facility];  Service: Orthopedics;  Laterality: Right;   Social History   Socioeconomic History  . Marital status: Widowed    Spouse name: Not on file  . Number of children: 4  . Years of education: Not on file  . Highest education level: Not on  file  Occupational History  . Not on file  Social Needs  . Financial resource strain: Not on file  . Food insecurity:    Worry: Not on file    Inability: Not on file  . Transportation needs:    Medical: Not on file    Non-medical: Not on file  Tobacco Use  . Smoking status: Never Smoker  . Smokeless tobacco: Never Used  Substance and Sexual Activity  . Alcohol use: No  . Drug use: No  . Sexual activity: Yes    Birth control/protection: Surgical  Lifestyle  . Physical activity:    Days per week: Not on file    Minutes per session: Not on file  . Stress: Not on file  Relationships  . Social connections:    Talks on phone:  Not on file    Gets together: Not on file    Attends religious service: Not on file    Active member of club or organization: Not on file    Attends meetings of clubs or organizations: Not on file    Relationship status: Not on file  Other Topics Concern  . Not on file  Social History Narrative  . Not on file   Family History  Problem Relation Age of Onset  . Diabetes Mother   . Hypertension Mother   . Diabetes Father   . Asthma Father   . Hypertension Father   . Hypertension Daughter   . Hypertension Sister        2  . Diabetes Sister   . Hypertension Brother        5  . Kidney disease Brother        Review of Systems: Pertinent positive and negative review of systems were noted in the above HPI section. All other review of systems were otherwise negative.    Physical Exam: General: Well developed, well nourished, obese, no acute distress Head: Normocephalic and atraumatic Eyes:  sclerae anicteric, EOMI Ears: Normal auditory acuity Mouth: No deformity or lesions Neck: Supple, no masses or thyromegaly Lungs: Clear throughout to auscultation Heart: Regular rate and rhythm; no murmurs, rubs or bruits Abdomen: Soft, non tender and non distended. No masses, hepatosplenomegaly or hernias noted. Normal Bowel sounds Rectal: Not done  Musculoskeletal: Symmetrical with no gross deformities  Skin: No lesions on visible extremities Pulses:  Normal pulses noted Extremities: No clubbing, cyanosis, edema or deformities noted Neurological: Alert oriented x 4, grossly nonfocal Cervical Nodes:  No significant cervical adenopathy Inguinal Nodes: No significant inguinal adenopathy Psychological:  Alert and cooperative. Normal mood and affect   Assessment and Recommendations:  1. GERD, dysphagia.  Follow standard antireflux measures.  Continue omeprazole 20 mg daily.  Avoid meats that are difficult to swallow until evaluation completed.  Rule out esophageal stricture, esophagitis,  neoplasm.  Schedule barium esophagram with tablet.  Schedule EGD with possible dilation. The risks (including bleeding, perforation, infection, missed lesions, medication reactions and possible hospitalization or surgery if complications occur), benefits, and alternatives to endoscopy with possible biopsy and possible dilation were discussed with the patient and they consent to proceed.   2. Diarrhea.  Potential food intolerance and/or metformin side effect.  I have asked her to maintain a food diary.  Trial of lactose free and gluten-free diets.  If there is is not effective I have advised her to speak with her PCP about 5 to 7-day hold of metformin.  Further evaluation of diarrhea if above measures are not successful in controlling  symptoms.   cc: Shelby Mattocks, PA-C [address] [place], [place] [address]

## 2017-12-15 NOTE — Patient Instructions (Signed)
You have been scheduled for a Barium Esophogram at [facility] (1st floor of the hospital) on 12/22/17 at 11:00am. Please arrive 15 minutes prior to your appointment for registration. Make certain not to have anything to eat or drink 3 hours prior to your test. If you need to reschedule for any reason, please contact radiology at [phone] to do so. __________________________________________________________________ A barium swallow is an examination that concentrates on views of the esophagus. This tends to be a double contrast exam (barium and two liquids which, when combined, create a gas to distend the wall of the oesophagus) or single contrast (non-ionic iodine based). The study is usually tailored to your symptoms so a good history is essential. Attention is paid during the study to the form, structure and configuration of the esophagus, looking for functional disorders (such as aspiration, dysphagia, achalasia, motility and reflux) EXAMINATION You may be asked to change into a gown, depending on the type of swallow being performed. A radiologist and radiographer will perform the procedure. The radiologist will advise you of the type of contrast selected for your procedure and direct you during the exam. You will be asked to stand, sit or lie in several different positions and to hold a small amount of fluid in your mouth before being asked to swallow while the imaging is performed .In some instances you may be asked to swallow barium coated marshmallows to assess the motility of a solid food bolus. The exam can be recorded as a digital or video fluoroscopy procedure. POST PROCEDURE It will take 1-2 days for the barium to pass through your system. To facilitate this, it is important, unless otherwise directed, to increase your fluids for the next 24-48hrs and to resume your normal diet.  This test typically takes about 30 minutes to  perform. ________________________________________________________________________ Teresa Stanley have been scheduled for an endoscopy. Please follow written instructions given to you at your visit today. If you use inhalers (even only as needed), please bring them with you on the day of your procedure. Your physician has requested that you go to www.startemmi.com and enter the access code given to you at your visit today. This web site gives a general overview about your procedure. However, you should still follow specific instructions given to you by our office regarding your preparation for the procedure.  Normal BMI (Body Mass Index- based on height and weight) is between 23 and 30. Your BMI today is Body mass index is 49.57 kg/m. [name] Please consider follow up  regarding your BMI with your Primary Care Provider.  Thank you for choosing me and [facility].  Pricilla Riffle. Dagoberto Ligas., MD., Marval Regal

## 2017-12-22 ENCOUNTER — Other Ambulatory Visit: Payer: Self-pay

## 2017-12-22 ENCOUNTER — Ambulatory Visit (HOSPITAL_COMMUNITY)
Admission: RE | Admit: 2017-12-22 | Discharge: 2017-12-22 | Disposition: A | Discharge status: Home / Self Care | Payer: Medicare Other | Source: Ambulatory Visit | Attending: Gastroenterology | Admitting: Gastroenterology

## 2017-12-22 DIAGNOSIS — R1310 Dysphagia, unspecified: Secondary | ICD-10-CM | POA: Insufficient documentation

## 2017-12-22 DIAGNOSIS — K225 Diverticulum of esophagus, acquired: Secondary | ICD-10-CM | POA: Diagnosis not present

## 2017-12-22 DIAGNOSIS — K219 Gastro-esophageal reflux disease without esophagitis: Secondary | ICD-10-CM | POA: Diagnosis not present

## 2017-12-22 DIAGNOSIS — K222 Esophageal obstruction: Secondary | ICD-10-CM | POA: Diagnosis not present

## 2017-12-25 ENCOUNTER — Other Ambulatory Visit (INDEPENDENT_AMBULATORY_CARE_PROVIDER_SITE_OTHER): Payer: Medicare Other

## 2017-12-25 ENCOUNTER — Telehealth: Payer: Self-pay | Admitting: Endocrinology

## 2017-12-25 DIAGNOSIS — K219 Gastro-esophageal reflux disease without esophagitis: Secondary | ICD-10-CM

## 2017-12-25 DIAGNOSIS — R1310 Dysphagia, unspecified: Secondary | ICD-10-CM | POA: Diagnosis not present

## 2017-12-25 LAB — BASIC METABOLIC PANEL
BUN: 29 mg/dL — ABNORMAL HIGH (ref 6–23)
CHLORIDE: 100 meq/L (ref 96–112)
CO2: 27 mEq/L (ref 19–32)
Calcium: 8.8 mg/dL (ref 8.4–10.5)
Creatinine, Ser: 1.65 mg/dL — ABNORMAL HIGH (ref 0.40–1.20)
GFR: 39.42 mL/min — AB (ref >60.00)
Glucose, Bld: 147 mg/dL — ABNORMAL HIGH (ref 70–99)
POTASSIUM: 5 meq/L (ref 3.5–5.1)
SODIUM: 139 meq/L (ref 135–145)

## 2017-12-25 NOTE — Telephone Encounter (Signed)
error 

## 2017-12-28 NOTE — Progress Notes (Signed)
pls call pt for f/u - elevated creatinine-- she should come in 3 weeks for f/u to see me only. Have her stop metformin

## 2017-12-29 ENCOUNTER — Encounter: Payer: Self-pay | Admitting: Internal Medicine

## 2017-12-29 ENCOUNTER — Ambulatory Visit (INDEPENDENT_AMBULATORY_CARE_PROVIDER_SITE_OTHER): Payer: Medicare Other | Admitting: Internal Medicine

## 2017-12-29 VITALS — BP 140/82 | HR 87 | Temp 97.3°F | Ht 61.5 in | Wt 271.2 lb

## 2017-12-29 DIAGNOSIS — N183 Chronic kidney disease, stage 3 unspecified: Secondary | ICD-10-CM

## 2017-12-29 DIAGNOSIS — Z7982 Long term (current) use of aspirin: Secondary | ICD-10-CM

## 2017-12-29 DIAGNOSIS — N289 Disorder of kidney and ureter, unspecified: Secondary | ICD-10-CM | POA: Diagnosis not present

## 2017-12-29 DIAGNOSIS — I129 Hypertensive chronic kidney disease with stage 1 through stage 4 chronic kidney disease, or unspecified chronic kidney disease: Secondary | ICD-10-CM

## 2017-12-29 DIAGNOSIS — R1310 Dysphagia, unspecified: Secondary | ICD-10-CM | POA: Diagnosis not present

## 2017-12-29 DIAGNOSIS — Z6841 Body Mass Index (BMI) 40.0 and over, adult: Secondary | ICD-10-CM

## 2017-12-29 DIAGNOSIS — Z9114 Patient's other noncompliance with medication regimen: Secondary | ICD-10-CM

## 2017-12-31 ENCOUNTER — Other Ambulatory Visit: Payer: Self-pay | Admitting: Internal Medicine

## 2018-01-01 ENCOUNTER — Ambulatory Visit (INDEPENDENT_AMBULATORY_CARE_PROVIDER_SITE_OTHER)
Admission: RE | Admit: 2018-01-01 | Discharge: 2018-01-01 | Disposition: A | Discharge status: Home / Self Care | Payer: Medicare Other | Source: Ambulatory Visit | Attending: Gastroenterology | Admitting: Gastroenterology

## 2018-01-01 DIAGNOSIS — K219 Gastro-esophageal reflux disease without esophagitis: Secondary | ICD-10-CM

## 2018-01-01 DIAGNOSIS — R1310 Dysphagia, unspecified: Secondary | ICD-10-CM

## 2018-01-01 MED ORDER — IOPAMIDOL (ISOVUE-300) INJECTION 61%
75.0000 mL | Freq: Once | INTRAVENOUS | Status: AC | PRN
  Administered 2018-01-01: 60 mL via INTRAVENOUS

## 2018-01-04 ENCOUNTER — Other Ambulatory Visit: Payer: Self-pay

## 2018-01-04 DIAGNOSIS — R1310 Dysphagia, unspecified: Secondary | ICD-10-CM

## 2018-01-10 NOTE — Progress Notes (Signed)
Subjective:     Patient ID: Teresa Stanley , female    DOB: Sep 19, 1945 , 72 y.o.   MRN: [id number]   Chief Complaint  Patient presents with  . kidney functions    patient states per Dr.Starkes her kidney functions are worse.    HPI  SHE IS HERE TODAY TO HAVE HER KIDNEY FUNCTION RECHECKED. SHE WAS ADVISED BY HER GI DOCTOR THAT HER RENAL FUNCTION HAD DECLINED. SHE DENIES N/V/D AT THIS TIME. SHE ADMITS TO NOT DRINKING ENOUGH WATER. SHE IS ACCOMPANIED BY HER DAUGHTER WHO STATES THAT HER MOTHER DOES NOT TAKE MEDS AS PRESCRIBED. THE PATIENT SHEEPISHLY AGREES TO THIS STATEMENT.     Past Medical History:  Diagnosis Date  . Arthritis   . Diabetes mellitus    takes Actos and Metformin daily and Victoza  . GERD (gastroesophageal reflux disease)    takes Omeprazole daily  . Headache(784.0)    occasionally  . Hyperlipidemia    takes Atorvastatin daily  . Hypertension    takes Lisinopril,Amlodipine,and Metoprolol daily  . Hypothyroidism   . Joint pain   . Thyroid disease    ?     Family History  Problem Relation Age of Onset  . Diabetes Mother   . Hypertension Mother   . Diabetes Father   . Asthma Father   . Hypertension Father   . Hypertension Daughter   . Hypertension Sister        2  . Diabetes Sister   . Hypertension Brother        5  . Kidney disease Brother      Current Outpatient Medications:  .  amLODipine (NORVASC) 10 MG tablet, Take 10 mg by mouth daily., Disp: , Rfl:  .  aspirin 81 MG tablet, Take 81 mg by mouth daily., Disp: , Rfl:  .  atorvastatin (LIPITOR) 20 MG tablet, Take 20 mg by mouth daily., Disp: , Rfl:  .  carboxymethylcellulose (REFRESH PLUS) 0.5 % SOLN, Place 1 drop into both eyes daily., Disp: , Rfl:  .  meclizine (ANTIVERT) 25 MG tablet, Take 1 tablet (25 mg total) by mouth 3 (three) times daily as needed for dizziness., Disp: 30 tablet, Rfl: 0 .  methimazole (TAPAZOLE) 5 MG tablet, TAKE 1 TABLET BY MOUTH EVERY DAY, Disp: 30 tablet, Rfl: 2 .   omeprazole (PRILOSEC) 20 MG capsule, Take 20 mg by mouth daily., Disp: , Rfl:  .  triamcinolone cream (KENALOG) 0.1 %, 1 application daily as needed., Disp: , Rfl:  .  triamterene-hydrochlorothiazide (MAXZIDE-25) 37.5-25 MG tablet, TAKE 1 TABLET BY MOUTH EVERY DAY IN THE MORNING, Disp: 30 tablet, Rfl: 5 .  propranolol ER (INDERAL LA) 120 MG 24 hr capsule, TAKE 1 CAPSULE BY MOUTH EVERY DAY, Disp: 90 capsule, Rfl: 2 .  Semaglutide,0.25 or 0.5MG /DOS, (OZEMPIC, 0.25 OR 0.5 MG/DOSE,) 2 MG/1.5ML SOPN, Inject into the skin. Sample pen given to pt, Disp: , Rfl:  .  Semaglutide,0.25 or 0.5MG /DOS, (OZEMPIC, 0.25 OR 0.5 MG/DOSE,) 2 MG/1.5ML SOPN, Inject 0.5 mg into the skin once a week. (Patient not taking: Reported on 12/29/2017), Disp: 1 pen, Rfl: 0   No Known Allergies   Review of Systems  Constitutional: Negative.   Respiratory: Negative.   Cardiovascular: Negative.   Gastrointestinal: Negative.   Genitourinary: Negative.   Neurological: Negative.   Psychiatric/Behavioral: Negative.      Today's Vitals   12/29/17 1418  BP: 140/82  Pulse: 87  Temp: (!) 97.3 F (36.3 C)  TempSrc: Oral  SpO2: 96%  Weight: 271 lb 3.2 oz (123 kg)  Height: 5' 1.5" (1.562 m)  PainSc: 0-No pain   Body mass index is 50.41 kg/m.   Objective:  Physical Exam  Constitutional: She is oriented to person, place, and time. She appears well-developed and well-nourished.  OBESE  HENT:  Head: Normocephalic and atraumatic.  Eyes: EOM are normal.  Cardiovascular: Normal rate, regular rhythm and normal heart sounds.  Pulmonary/Chest: Effort normal and breath sounds normal.  Neurological: She is alert and oriented to person, place, and time.  Psychiatric: She has a normal mood and affect.  Nursing note and vitals reviewed.       Assessment And Plan:     1. Renal insufficiency  RECENT BLOODWORK WAS REVIEWED. I WILL DECREASE HER METFORMIN DOSING TO MWF UNTIL SHE RUNS OUT. SHE IS ENCOURAGED TO STAY WELL HYDRATED.  SHE WILL RTO IN FOUR WEEKS. I WILL CHECK A BMET AT THIS TIME.   2. Dysphagia, unspecified type  CHRONIC, AS PER GI.   3. Hypertensive nephropathy  FAIR CONTROL. SHE IS ENCOURAGED TO INCORPORATE MORE EXERCISE INTO HER DAILY ROUTINE.   4. Chronic renal disease, stage III (HCC)  CHRONIC.   5. Class 3 severe obesity due to excess calories with serious comorbidity and body mass index (BMI) of 50.0 to 59.9 in adult ([facility])  SHE IS ENCOURAGED TO STRIVE FOR BMI LESS THAN 45 (INITIALLY) TO DECREASE CARDIAC RISK. SHE IS ENCOURAGED TO INCORPORATE MORE ACTIVITY INTO HER DAILY ROUTINE. SHE IS ADVISED THAT CHAIR EXERCISES COUNT! SHE IS ENCOURAGED TO PERFORM CHAIR EXERCISES DURING COMMERCIALS OF A ONE-HOUR TELEVISION SHOW.   Maximino Greenland, MD

## 2018-01-12 ENCOUNTER — Encounter: Payer: Self-pay | Admitting: Gastroenterology

## 2018-01-18 ENCOUNTER — Telehealth: Payer: Self-pay | Admitting: *Deleted

## 2018-01-18 NOTE — Telephone Encounter (Signed)
Please use the BMI from our office so schedule at [facility].

## 2018-01-18 NOTE — Telephone Encounter (Signed)
When the patient was evaluated in the office, the patient's BMI was below 50.  At her PCP visit, it was over 68.   Please, advise.

## 2018-01-20 ENCOUNTER — Encounter: Payer: Self-pay | Admitting: *Deleted

## 2018-01-20 NOTE — Progress Notes (Signed)
Spoke with Osvaldo Angst CRNA.  He had reviewed pt's hx of height being 5'2.. The 12-29-17 height was documented as 5'1/2.  With her accurate height of 5'2, her BMI is less than 50.  Ok to for [employer]

## 2018-01-21 ENCOUNTER — Ambulatory Visit (AMBULATORY_SURGERY_CENTER): Payer: Medicare Other | Admitting: Gastroenterology

## 2018-01-21 ENCOUNTER — Encounter: Payer: Self-pay | Admitting: Gastroenterology

## 2018-01-21 VITALS — BP 127/82 | HR 86 | Temp 97.5°F | Resp 21 | Ht 62.0 in | Wt 271.0 lb

## 2018-01-21 DIAGNOSIS — K317 Polyp of stomach and duodenum: Secondary | ICD-10-CM | POA: Diagnosis not present

## 2018-01-21 DIAGNOSIS — R1310 Dysphagia, unspecified: Secondary | ICD-10-CM

## 2018-01-21 DIAGNOSIS — K222 Esophageal obstruction: Secondary | ICD-10-CM

## 2018-01-21 DIAGNOSIS — K219 Gastro-esophageal reflux disease without esophagitis: Secondary | ICD-10-CM

## 2018-01-21 MED ORDER — SODIUM CHLORIDE 0.9 % IV SOLN: 500.0000 mL | Freq: Once | INTRAVENOUS | Status: DC

## 2018-01-21 NOTE — Progress Notes (Signed)
Called to room to assist during endoscopic procedure.  Patient ID and intended procedure confirmed with present staff. Received instructions for my participation in the procedure from the performing physician.  

## 2018-01-21 NOTE — Op Note (Signed)
[facility] Patient Name: Teresa Stanley Procedure Date: 01/21/2018 10:06 AM MRN: [id number] Endoscopist: Ladene Artist , MD Age: 72 Referring MD:  Date of Birth: September 18, 1945 Gender: Female Account #: [id number] Procedure:                Upper GI endoscopy Indications:              Dysphagia, Gastroesophageal reflux disease Medicines:                Monitored Anesthesia Care Procedure:                Pre-Anesthesia Assessment:                           - Prior to the procedure, a History and Physical                            was performed, and patient medications and                            allergies were reviewed. The patient's tolerance of                            previous anesthesia was also reviewed. The risks                            and benefits of the procedure and the sedation                            options and risks were discussed with the patient.                            All questions were answered, and informed consent                            was obtained. Prior Anticoagulants: The patient has                            taken no previous anticoagulant or antiplatelet                            agents. ASA Grade Assessment: III - A patient with                            severe systemic disease. After reviewing the risks                            and benefits, the patient was deemed in                            satisfactory condition to undergo the procedure.                           After obtaining informed consent, the endoscope was  passed under direct vision. Throughout the                            procedure, the patient's blood pressure, pulse, and                            oxygen saturations were monitored continuously. The                            Model GIF-HQ190 [id number]) scope was introduced                            through the mouth, and advanced to the second part                            of  duodenum. The upper GI endoscopy was                            accomplished without difficulty. The patient                            tolerated the procedure. Scope In: Scope Out: Findings:                 One benign-appearing, intrinsic mild stenosis was                            found at the gastroesophageal junction. This                            stenosis measured 1.3 cm (inner diameter). The                            stenosis was traversed. A guidewire was placed and                            the scope was withdrawn. Dilations were performed                            with Savary dilators with mild resistance at 14 mm,                            15 mm and 16 mm. No heme noted on dilators.                           The exam of the esophagus was otherwise normal.                           Two 6 to 9 mm sessile polyps with no bleeding and                            no stigmata of recent bleeding were found in the  gastric antrum and in the gastric fundus                            respectively. The polyps were removed with a cold                            snare. Resection and retrieval were complete.                           A single 6 mm submucosal papule (nodule) with no                            stigmata of recent bleeding was found in the                            gastric antrum.                           The exam of the stomach was otherwise normal.                           The duodenal bulb and second portion of the                            duodenum were normal. Complications:            No immediate complications. Estimated Blood Loss:     Estimated blood loss was minimal. Impression:               - Benign-appearing esophageal stenosis. Dilated.                           - Two gastric polyps. Resected and retrieved.                           - A single submucosal papule (nodule) found in the                            stomach.                            - Normal duodenal bulb and second portion of the                            duodenum. Recommendation:           - Patient has a contact number available for                            emergencies. The signs and symptoms of potential                            delayed complications were discussed with the                            patient. Return to normal activities tomorrow.  Written discharge instructions were provided to the                            patient.                           - Clear liquid diet for 2 hours, then advance as                            tolerated to soft diet today. Resume prior diet                            tomorrow.                           - Antireflux measures long term.                           - Continue present medications.                           - Await pathology results. Ladene Artist, MD 01/21/2018 10:34:01 AM This report has been signed electronically.

## 2018-01-21 NOTE — Progress Notes (Signed)
To PACU, VSS. Report to [email or web address] 

## 2018-01-21 NOTE — Patient Instructions (Signed)
Dilation Diet implemented, and handouts given for GERD and Stricture  YOU HAD AN ENDOSCOPIC PROCEDURE TODAY AT [facility]:   Refer to the procedure report that was given to you for any specific questions about what was found during the examination.  If the procedure report does not answer your questions, please call your gastroenterologist to clarify.  If you requested that your care partner not be given the details of your procedure findings, then the procedure report has been included in a sealed envelope for you to review at your convenience later.  YOU SHOULD EXPECT: Some feelings of bloating in the abdomen. Passage of more gas than usual.  Walking can help get rid of the air that was put into your GI tract during the procedure and reduce the bloating. If you had a lower endoscopy (such as a colonoscopy or flexible sigmoidoscopy) you may notice spotting of blood in your stool or on the toilet paper. If you underwent a bowel prep for your procedure, you may not have a normal bowel movement for a few days.  Please Note:  You might notice some irritation and congestion in your nose or some drainage.  This is from the oxygen used during your procedure.  There is no need for concern and it should clear up in a day or so.  SYMPTOMS TO REPORT IMMEDIATELY:   Following upper endoscopy (EGD)  Vomiting of blood or coffee ground material  New chest pain or pain under the shoulder blades  Painful or persistently difficult swallowing  New shortness of breath  Fever of 100F or higher  Black, tarry-looking stools  For urgent or emergent issues, a gastroenterologist can be reached at any hour by calling [phone].   DIET:  We do recommend a small meal at first, but then you may proceed to your regular diet.  Drink plenty of fluids but you should avoid alcoholic beverages for 24 hours.  ACTIVITY:  You should plan to take it easy for the rest of today and you should NOT DRIVE or use  heavy machinery until tomorrow (because of the sedation medicines used during the test).    FOLLOW UP: Our staff will call the number listed on your records the next business day following your procedure to check on you and address any questions or concerns that you may have regarding the information given to you following your procedure. If we do not reach you, we will leave a message.  However, if you are feeling well and you are not experiencing any problems, there is no need to return our call.  We will assume that you have returned to your regular daily activities without incident.  If any biopsies were taken you will be contacted by phone or by letter within the next 1-3 weeks.  Please call us at [phone] if you have not heard about the biopsies in 3 weeks.    SIGNATURES/CONFIDENTIALITY: You and/or your care partner have signed paperwork which will be entered into your electronic medical record.  These signatures attest to the fact that that the information above on your After Visit Summary has been reviewed and is understood.  Full responsibility of the confidentiality of this discharge information lies with you and/or your care-partner.

## 2018-01-22 ENCOUNTER — Telehealth: Payer: Self-pay

## 2018-01-22 NOTE — Telephone Encounter (Signed)
  Follow up Call-  Call back number 01/21/2018  Post procedure Call Back phone  # [phone]  Permission to leave phone message No  Some recent data might be hidden     Patient questions:  Do you have a fever, pain , or abdominal swelling? No. Pain Score  0 *  Have you tolerated food without any problems? Yes.    Have you been able to return to your normal activities? Yes.    Do you have any questions about your discharge instructions: Diet   No. Medications  No. Follow up visit  No.  Do you have questions or concerns about your Care? Yes.    Actions: * If pain score is 4 or above: No action needed, pain <4.

## 2018-01-23 ENCOUNTER — Other Ambulatory Visit: Payer: Self-pay | Admitting: Internal Medicine

## 2018-01-27 ENCOUNTER — Other Ambulatory Visit: Payer: Self-pay | Admitting: Internal Medicine

## 2018-01-29 ENCOUNTER — Encounter: Payer: Self-pay | Admitting: Gastroenterology

## 2018-02-01 ENCOUNTER — Ambulatory Visit (INDEPENDENT_AMBULATORY_CARE_PROVIDER_SITE_OTHER): Payer: Medicare Other | Admitting: Internal Medicine

## 2018-02-01 ENCOUNTER — Encounter: Payer: Self-pay | Admitting: Internal Medicine

## 2018-02-01 VITALS — BP 122/78 | HR 91 | Temp 97.9°F | Ht 62.0 in | Wt 270.2 lb

## 2018-02-01 DIAGNOSIS — N183 Chronic kidney disease, stage 3 unspecified: Secondary | ICD-10-CM

## 2018-02-01 DIAGNOSIS — I129 Hypertensive chronic kidney disease with stage 1 through stage 4 chronic kidney disease, or unspecified chronic kidney disease: Secondary | ICD-10-CM

## 2018-02-01 DIAGNOSIS — Z6841 Body Mass Index (BMI) 40.0 and over, adult: Secondary | ICD-10-CM

## 2018-02-01 DIAGNOSIS — E1122 Type 2 diabetes mellitus with diabetic chronic kidney disease: Secondary | ICD-10-CM | POA: Diagnosis not present

## 2018-02-01 DIAGNOSIS — E010 Iodine-deficiency related diffuse (endemic) goiter: Secondary | ICD-10-CM

## 2018-02-01 LAB — POCT UA - MICROALBUMIN
Albumin/Creatinine Ratio, Urine, POC: 300
Creatinine, POC: 200 mg/dL
Microalbumin Ur, POC: 150 mg/L

## 2018-02-01 NOTE — Patient Instructions (Signed)
Diabetes Mellitus and Exercise Exercising regularly is important for your overall health, especially when you have diabetes (diabetes mellitus). Exercising is not only about losing weight. It has many other health benefits, such as increasing muscle strength and bone density and reducing body fat and stress. This leads to improved fitness, flexibility, and endurance, all of which result in better overall health. Exercise has additional benefits for people with diabetes, including:  Reducing appetite.  Helping to lower and control blood glucose.  Lowering blood pressure.  Helping to control amounts of fatty substances (lipids) in the blood, such as cholesterol and triglycerides.  Helping the body to respond better to insulin (improving insulin sensitivity).  Reducing how much insulin the body needs.  Decreasing the risk for heart disease by: ? Lowering cholesterol and triglyceride levels. ? Increasing the levels of good cholesterol. ? Lowering blood glucose levels. What is my activity plan? Your health care provider or certified diabetes educator can help you make a plan for the type and frequency of exercise (activity plan) that works for you. Make sure that you:  Do at least 150 minutes of moderate-intensity or vigorous-intensity exercise each week. This could be brisk walking, biking, or water aerobics. ? Do stretching and strength exercises, such as yoga or weightlifting, at least 2 times a week. ? Spread out your activity over at least 3 days of the week.  Get some form of physical activity every day. ? Do not go more than 2 days in a row without some kind of physical activity. ? Avoid being inactive for more than 30 minutes at a time. Take frequent breaks to walk or stretch.  Choose a type of exercise or activity that you enjoy, and set realistic goals.  Start slowly, and gradually increase the intensity of your exercise over time. What do I need to know about managing my  diabetes?   Check your blood glucose before and after exercising. ? If your blood glucose is 240 mg/dL (13.3 mmol/L) or higher before you exercise, check your urine for ketones. If you have ketones in your urine, do not exercise until your blood glucose returns to normal. ? If your blood glucose is 100 mg/dL (5.6 mmol/L) or lower, eat a snack containing 15-20 grams of carbohydrate. Check your blood glucose 15 minutes after the snack to make sure that your level is above 100 mg/dL (5.6 mmol/L) before you start your exercise.  Know the symptoms of low blood glucose (hypoglycemia) and how to treat it. Your risk for hypoglycemia increases during and after exercise. Common symptoms of hypoglycemia can include: ? Hunger. ? Anxiety. ? Sweating and feeling clammy. ? Confusion. ? Dizziness or feeling light-headed. ? Increased heart rate or palpitations. ? Blurry vision. ? Tingling or numbness around the mouth, lips, or tongue. ? Tremors or shakes. ? Irritability.  Keep a rapid-acting carbohydrate snack available before, during, and after exercise to help prevent or treat hypoglycemia.  Avoid injecting insulin into areas of the body that are going to be exercised. For example, avoid injecting insulin into: ? The arms, when playing tennis. ? The legs, when jogging.  Keep records of your exercise habits. Doing this can help you and your health care provider adjust your diabetes management plan as needed. Write down: ? Food that you eat before and after you exercise. ? Blood glucose levels before and after you exercise. ? The type and amount of exercise you have done. ? When your insulin is expected to peak, if you use   insulin. Avoid exercising at times when your insulin is peaking.  When you start a new exercise or activity, work with your health care provider to make sure the activity is safe for you, and to adjust your insulin, medicines, or food intake as needed.  Drink plenty of water while  you exercise to prevent dehydration or heat stroke. Drink enough fluid to keep your urine clear or pale yellow. Summary  Exercising regularly is important for your overall health, especially when you have diabetes (diabetes mellitus).  Exercising has many health benefits, such as increasing muscle strength and bone density and reducing body fat and stress.  Your health care provider or certified diabetes educator can help you make a plan for the type and frequency of exercise (activity plan) that works for you.  When you start a new exercise or activity, work with your health care provider to make sure the activity is safe for you, and to adjust your insulin, medicines, or food intake as needed. This information is not intended to replace advice given to you by your health care provider. Make sure you discuss any questions you have with your health care provider. Document Released: 04/19/2003 Document Revised: 08/07/2016 Document Reviewed: 07/09/2015 Elsevier Interactive Patient Education  2019 [employer] Inc[employer]  

## 2018-02-01 NOTE — Progress Notes (Signed)
Subjective:     Patient ID: Teresa Stanley , female    DOB: 1945/10/31 , 72 y.o.   MRN: [id number]   Chief Complaint  Patient presents with  . Diabetes  . Hypertension    HPI  Diabetes  She presents for her follow-up diabetic visit. She has type 2 diabetes mellitus. Her disease course has been improving. There are no hypoglycemic associated symptoms. Pertinent negatives for diabetes include no blurred vision, no chest pain and no weakness. There are no hypoglycemic complications. Diabetic complications include nephropathy. Risk factors for coronary artery disease include diabetes mellitus, dyslipidemia, hypertension, post-menopausal, sedentary lifestyle and obesity.  Hypertension  This is a chronic problem. The current episode started more than 1 year ago. The problem has been gradually improving since onset. The problem is controlled. Pertinent negatives include no blurred vision, chest pain, palpitations or shortness of breath.   She reports compliance with meds.   Past Medical History:  Diagnosis Date  . Arthritis   . Diabetes mellitus    takes Actos and Metformin daily and Victoza  . GERD (gastroesophageal reflux disease)    takes Omeprazole daily  . Headache(784.0)    occasionally  . Hyperlipidemia    takes Atorvastatin daily  . Hypertension    takes Lisinopril,Amlodipine,and Metoprolol daily  . Hypothyroidism   . Joint pain   . Thyroid disease    ?     Family History  Problem Relation Age of Onset  . Diabetes Mother   . Hypertension Mother   . Diabetes Father   . Asthma Father   . Hypertension Father   . Hypertension Daughter   . Hypertension Sister        2  . Diabetes Sister   . Hypertension Brother        5  . Kidney disease Brother   . Colon cancer Neg Hx   . Esophageal cancer Neg Hx   . Rectal cancer Neg Hx   . Stomach cancer Neg Hx      Current Outpatient Medications:  .  amLODipine (NORVASC) 10 MG tablet, Take 10 mg by mouth daily., Disp: , Rfl:   .  aspirin 81 MG tablet, Take 81 mg by mouth daily., Disp: , Rfl:  .  atorvastatin (LIPITOR) 20 MG tablet, Take 20 mg by mouth daily., Disp: , Rfl:  .  carboxymethylcellulose (REFRESH PLUS) 0.5 % SOLN, Place 1 drop into both eyes daily., Disp: , Rfl:  .  meclizine (ANTIVERT) 25 MG tablet, Take 1 tablet (25 mg total) by mouth 3 (three) times daily as needed for dizziness., Disp: 30 tablet, Rfl: 0 .  metFORMIN (GLUCOPHAGE) 1000 MG tablet, TAKE 1 TABLET BY MOUTH TWICE A DAY WITH MORNING AND EVENING MEALS (Patient taking differently: 1 tab 2 times every other day), Disp: 60 tablet, Rfl: 5 .  methimazole (TAPAZOLE) 5 MG tablet, TAKE 1 TABLET BY MOUTH EVERY DAY, Disp: 30 tablet, Rfl: 2 .  omeprazole (PRILOSEC) 20 MG capsule, Take 20 mg by mouth daily., Disp: , Rfl:  .  propranolol ER (INDERAL LA) 120 MG 24 hr capsule, TAKE 1 CAPSULE BY MOUTH EVERY DAY, Disp: 90 capsule, Rfl: 2 .  Semaglutide,0.25 or 0.5MG/DOS, (OZEMPIC, 0.25 OR 0.5 MG/DOSE,) 2 MG/1.5ML SOPN, Inject 0.5 mg into the skin once a week., Disp: 1 pen, Rfl: 0 .  triamcinolone cream (KENALOG) 0.1 %, 1 application daily as needed., Disp: , Rfl:  .  triamterene-hydrochlorothiazide (MAXZIDE-25) 37.5-25 MG tablet, TAKE 1 TABLET BY MOUTH EVERY  DAY IN THE MORNING, Disp: 30 tablet, Rfl: 5   No Known Allergies   Review of Systems  Constitutional: Negative.   Eyes: Negative for blurred vision.  Respiratory: Negative.  Negative for shortness of breath.   Cardiovascular: Negative.  Negative for chest pain and palpitations.  Gastrointestinal: Negative.   Neurological: Negative.  Negative for weakness.  Psychiatric/Behavioral: Negative.      Today's Vitals   02/01/18 1021  BP: 122/78  Pulse: 91  Temp: 97.9 F (36.6 C)  TempSrc: Oral  Weight: 270 lb 3.2 oz (122.6 kg)  Height: 5' 2" (1.575 m)  PainSc: 0-No pain   Body mass index is 49.42 kg/m.   Objective:  Physical Exam Vitals signs and nursing note reviewed.  Constitutional:       Appearance: Normal appearance.  HENT:     Head: Normocephalic and atraumatic.  Neck:     Musculoskeletal: Normal range of motion.  Cardiovascular:     Rate and Rhythm: Normal rate and regular rhythm.     Heart sounds: Normal heart sounds.     Comments: B/l LE edema Pulmonary:     Effort: Pulmonary effort is normal.     Breath sounds: Normal breath sounds.  Skin:    General: Skin is warm and dry.  Neurological:     General: No focal deficit present.     Mental Status: She is alert and oriented to person, place, and time.  Psychiatric:        Mood and Affect: Mood normal.         Assessment And Plan:     1. Diabetes mellitus with stage 3 chronic kidney disease ([place])  I will check labs as listed below. She will continue with Ozempic for now. She is encouraged to incorporate more exercise into her daily routine.   - Hepatitis C antibody - CMP14+EGFR - Hemoglobin A1c - POCT UA - Microalbumin  2. Chronic renal disease, stage III ([facility])  Chronic. I will check GFR, Cr today. She is encouraged to stay well hydrated.    3. Hypertensive nephropathy  Well controlled. She will continue with current meds. She is encouraged to avoid adding salt to her foods.   4. Thyromegaly  CT scan reviewed in full detail. She has upcoming ENT appt. I will consider thyroid u/s if not ordered by ENT.   5. Class 3 severe obesity due to excess calories with serious comorbidity and body mass index (BMI) of 45.0 to 49.9 in adult [facility])  She is encouraged to strive for BMI less than 40 to decrease cardiac risk. She is encouraged to perform chair exercises five days weekly.  Maximino Greenland, MD

## 2018-02-02 LAB — CMP14+EGFR
ALT: 7 IU/L (ref 0–32)
AST: 8 IU/L (ref 0–40)
Albumin/Globulin Ratio: 1.6 (ref 1.2–2.2)
Albumin: 4.1 g/dL (ref 3.5–4.8)
Alkaline Phosphatase: 137 IU/L — ABNORMAL HIGH (ref 39–117)
BUN/Creatinine Ratio: 15 (ref 12–28)
BUN: 24 mg/dL (ref 8–27)
Bilirubin Total: 0.4 mg/dL (ref 0.0–1.2)
CO2: 22 mmol/L (ref 20–29)
Calcium: 9.6 mg/dL (ref 8.7–10.3)
Chloride: 101 mmol/L (ref 96–106)
Creatinine, Ser: 1.56 mg/dL — ABNORMAL HIGH (ref 0.57–1.00)
GFR calc Af Amer: 38 mL/min/{1.73_m2} — ABNORMAL LOW (ref >59)
GFR calc non Af Amer: 33 mL/min/{1.73_m2} — ABNORMAL LOW (ref >59)
Globulin, Total: 2.6 g/dL (ref 1.5–4.5)
Glucose: 157 mg/dL — ABNORMAL HIGH (ref 65–99)
Potassium: 5.6 mmol/L — ABNORMAL HIGH (ref 3.5–5.2)
SODIUM: 140 mmol/L (ref 134–144)
Total Protein: 6.7 g/dL (ref 6.0–8.5)

## 2018-02-02 LAB — HEMOGLOBIN A1C
Est. average glucose Bld gHb Est-mCnc: 177 mg/dL
Hgb A1c MFr Bld: 7.8 % — ABNORMAL HIGH (ref 4.8–5.6)

## 2018-02-02 LAB — HEPATITIS C ANTIBODY: Hep C Virus Ab: <0.1 s/co ratio (ref 0.0–0.9)

## 2018-02-17 ENCOUNTER — Ambulatory Visit: Payer: Medicare Other | Admitting: Internal Medicine

## 2018-03-28 ENCOUNTER — Other Ambulatory Visit: Payer: Self-pay | Admitting: Internal Medicine

## 2018-04-19 ENCOUNTER — Ambulatory Visit
Admission: RE | Admit: 2018-04-19 | Discharge: 2018-04-19 | Disposition: A | Discharge status: Home / Self Care | Payer: Medicare Other | Source: Ambulatory Visit | Attending: Internal Medicine | Admitting: Internal Medicine

## 2018-04-19 DIAGNOSIS — R921 Mammographic calcification found on diagnostic imaging of breast: Secondary | ICD-10-CM

## 2018-04-29 ENCOUNTER — Other Ambulatory Visit: Payer: Self-pay | Admitting: Internal Medicine

## 2018-05-03 ENCOUNTER — Other Ambulatory Visit: Payer: Self-pay | Admitting: Internal Medicine

## 2018-05-03 ENCOUNTER — Ambulatory Visit: Payer: Medicare Other | Admitting: Internal Medicine

## 2018-05-05 ENCOUNTER — Other Ambulatory Visit: Payer: Self-pay

## 2018-05-05 MED ORDER — SEMAGLUTIDE(0.25 OR 0.5MG/DOS) 2 MG/1.5ML ~~LOC~~ SOPN: 0.5000 mg | PEN_INJECTOR | SUBCUTANEOUS | 1 refills | Status: DC

## 2018-05-19 ENCOUNTER — Ambulatory Visit: Payer: Medicare Other | Admitting: Internal Medicine

## 2018-05-27 ENCOUNTER — Other Ambulatory Visit: Payer: Self-pay

## 2018-05-27 ENCOUNTER — Ambulatory Visit (INDEPENDENT_AMBULATORY_CARE_PROVIDER_SITE_OTHER): Payer: Medicare Other

## 2018-05-27 ENCOUNTER — Encounter: Payer: Self-pay | Admitting: Internal Medicine

## 2018-05-27 ENCOUNTER — Ambulatory Visit (INDEPENDENT_AMBULATORY_CARE_PROVIDER_SITE_OTHER): Payer: Medicare Other | Admitting: Internal Medicine

## 2018-05-27 VITALS — BP 160/90 | HR 84 | Temp 98.3°F | Ht 62.0 in | Wt 262.0 lb

## 2018-05-27 DIAGNOSIS — Z23 Encounter for immunization: Secondary | ICD-10-CM | POA: Diagnosis not present

## 2018-05-27 DIAGNOSIS — E1122 Type 2 diabetes mellitus with diabetic chronic kidney disease: Secondary | ICD-10-CM | POA: Diagnosis not present

## 2018-05-27 DIAGNOSIS — N183 Chronic kidney disease, stage 3 unspecified: Secondary | ICD-10-CM

## 2018-05-27 DIAGNOSIS — Z0000 Encounter for general adult medical examination without abnormal findings: Secondary | ICD-10-CM | POA: Diagnosis not present

## 2018-05-27 DIAGNOSIS — Z6841 Body Mass Index (BMI) 40.0 and over, adult: Secondary | ICD-10-CM

## 2018-05-27 DIAGNOSIS — I129 Hypertensive chronic kidney disease with stage 1 through stage 4 chronic kidney disease, or unspecified chronic kidney disease: Secondary | ICD-10-CM | POA: Diagnosis not present

## 2018-05-27 LAB — POCT URINALYSIS DIPSTICK
Bilirubin, UA: NEGATIVE
Blood, UA: NEGATIVE
Glucose, UA: POSITIVE — AB
Ketones, UA: NEGATIVE
Leukocytes, UA: NEGATIVE
Nitrite, UA: NEGATIVE
Protein, UA: POSITIVE — AB
Spec Grav, UA: 1.015 (ref 1.010–1.025)
Urobilinogen, UA: 0.2 E.U./dL
pH, UA: 5.5 (ref 5.0–8.0)

## 2018-05-27 LAB — POCT UA - MICROALBUMIN
Albumin/Creatinine Ratio, Urine, POC: >300
Creatinine, POC: 100 mg/dL
Microalbumin Ur, POC: 150 mg/L

## 2018-05-27 MED ORDER — SEMAGLUTIDE(0.25 OR 0.5MG/DOS) 2 MG/1.5ML ~~LOC~~ SOPN: 0.5000 mg | PEN_INJECTOR | SUBCUTANEOUS | 1 refills | Status: DC

## 2018-05-27 MED ORDER — PNEUMOCOCCAL 13-VAL CONJ VACC IM SUSP: 0.5000 mL | INTRAMUSCULAR | 0 refills | Status: AC

## 2018-05-27 NOTE — Patient Instructions (Signed)
Chronic Kidney Disease, Adult Chronic kidney disease (CKD) happens when the kidneys are damaged over a long period of time. The kidneys are two organs that help with:  Getting rid of waste and extra fluid from the blood.  Making hormones that maintain the amount of fluid in your tissues and blood vessels.  Making sure that the body has the right amount of fluids and chemicals. Most of the time, CKD does not go away, but it can usually be controlled. Steps must be taken to slow down the kidney damage or to stop it from getting worse. If this is not done, the kidneys may stop working. Follow these instructions at home: Medicines  Take over-the-counter and prescription medicines only as told by your doctor. You may need to change the amount of medicines you take.  Do not take any new medicines unless your doctor says it is okay. Many medicines can make your kidney damage worse.  Do not take any vitamin and supplements unless your doctor says it is okay. Many vitamins and supplements can make your kidney damage worse. General instructions  Follow a diet as told by your doctor. You may need to stay away from: ? Alcohol. ? Salty foods. ? Foods that are high in:  Potassium.  Calcium.  Protein.  Do not use any products that contain nicotine or tobacco, such as cigarettes and e-cigarettes. If you need help quitting, ask your doctor.  Keep track of your blood pressure at home. Tell your doctor about any changes.  If you have diabetes, keep track of your blood sugar as told by your doctor.  Try to stay at a healthy weight. If you need help, ask your doctor.  Exercise at least 30 minutes a day, 5 days a week.  Stay up-to-date with your shots (immunizations) as told by your doctor.  Keep all follow-up visits as told by your doctor. This is important. Contact a doctor if:  Your symptoms get worse.  You have new symptoms. Get help right away if:  You have symptoms of end-stage  kidney disease. These may include: ? Headaches. ? Numbness in your hands or feet. ? Easy bruising. ? Having hiccups often. ? Chest pain. ? Shortness of breath. ? Stopping of menstrual periods in women.  You have a fever.  You have very little pee (urine).  You have pain or bleeding when you pee. Summary  Chronic kidney disease (CKD) happens when the kidneys are damaged over a long period of time.  Most of the time, this condition does not go away, but it can usually be controlled. Steps must be taken to slow down the kidney damage or to stop it from getting worse.  Treatment may include a combination of medicines and lifestyle changes. This information is not intended to replace advice given to you by your health care provider. Make sure you discuss any questions you have with your health care provider. Document Released: 04/23/2009 Document Revised: 03/03/2016 Document Reviewed: 03/03/2016 Elsevier Interactive Patient Education  2019 [employer]  

## 2018-05-27 NOTE — Patient Instructions (Signed)
Teresa Stanley , Thank you for taking time to come for your Medicare Wellness Visit. I appreciate your ongoing commitment to your health goals. Please review the following plan we discussed and let me know if I can assist you in the future.   Screening recommendations/referrals: Colonoscopy: 11/2015 Mammogram: 04/2018 Bone Density: 09/2016 Recommended yearly ophthalmology/optometry visit for glaucoma screening and checkup Recommended yearly dental visit for hygiene and checkup  Vaccinations: Influenza vaccine: 11/2017 Pneumococcal vaccine: 11/2015 Tdap vaccine: 08/2017 Shingles vaccine: discussed    Advanced directives: Please bring a copy of your POA (Power of [place]) and/or Living Will to your next appointment.    Conditions/risks identified: Obesity  Next appointment: 07/06/2018 11:15   Preventive Care 65 Years and Older, Female Preventive care refers to lifestyle choices and visits with your health care provider that can promote health and wellness. What does preventive care include?  A yearly physical exam. This is also called an annual well check.  Dental exams once or twice a year.  Routine eye exams. Ask your health care provider how often you should have your eyes checked.  Personal lifestyle choices, including:  Daily care of your teeth and gums.  Regular physical activity.  Eating a healthy diet.  Avoiding tobacco and drug use.  Limiting alcohol use.  Practicing safe sex.  Taking low-dose aspirin every day.  Taking vitamin and mineral supplements as recommended by your health care provider. What happens during an annual well check? The services and screenings done by your health care provider during your annual well check will depend on your age, overall health, lifestyle risk factors, and family history of disease. Counseling  Your health care provider may ask you questions about your:  Alcohol use.  Tobacco use.  Drug use.  Emotional well-being.   Home and relationship well-being.  Sexual activity.  Eating habits.  History of falls.  Memory and ability to understand (cognition).  Work and work Statistician.  Reproductive health. Screening  You may have the following tests or measurements:  Height, weight, and BMI.  Blood pressure.  Lipid and cholesterol levels. These may be checked every 5 years, or more frequently if you are over 59 years old.  Skin check.  Lung cancer screening. You may have this screening every year starting at age 72 if you have a 30-pack-year history of smoking and currently smoke or have quit within the past 15 years.  Fecal occult blood test (FOBT) of the stool. You may have this test every year starting at age 78.  Flexible sigmoidoscopy or colonoscopy. You may have a sigmoidoscopy every 5 years or a colonoscopy every 10 years starting at age 83.  Hepatitis C blood test.  Hepatitis B blood test.  Sexually transmitted disease (STD) testing.  Diabetes screening. This is done by checking your blood sugar (glucose) after you have not eaten for a while (fasting). You may have this done every 1-3 years.  Bone density scan. This is done to screen for osteoporosis. You may have this done starting at age 72.  Mammogram. This may be done every 1-2 years. Talk to your health care provider about how often you should have regular mammograms. Talk with your health care provider about your test results, treatment options, and if necessary, the need for more tests. Vaccines  Your health care provider may recommend certain vaccines, such as:  Influenza vaccine. This is recommended every year.  Tetanus, diphtheria, and acellular pertussis (Tdap, Td) vaccine. You may need a Td booster every  10 years.  Zoster vaccine. You may need this after age 70.  Pneumococcal 13-valent conjugate (PCV13) vaccine. One dose is recommended after age 72.  Pneumococcal polysaccharide (PPSV23) vaccine. One dose is  recommended after age 72. Talk to your health care provider about which screenings and vaccines you need and how often you need them. This information is not intended to replace advice given to you by your health care provider. Make sure you discuss any questions you have with your health care provider. Document Released: 02/23/2015 Document Revised: 10/17/2015 Document Reviewed: 11/28/2014 Elsevier Interactive Patient Education  2017 [place] Prevention in the Home Falls can cause injuries. They can happen to people of all ages. There are many things you can do to make your home safe and to help prevent falls. What can I do on the outside of my home?  Regularly fix the edges of walkways and driveways and fix any cracks.  Remove anything that might make you trip as you walk through a door, such as a raised step or threshold.  Trim any bushes or trees on the path to your home.  Use bright outdoor lighting.  Clear any walking paths of anything that might make someone trip, such as rocks or tools.  Regularly check to see if handrails are loose or broken. Make sure that both sides of any steps have handrails.  Any raised decks and porches should have guardrails on the edges.  Have any leaves, snow, or ice cleared regularly.  Use sand or salt on walking paths during winter.  Clean up any spills in your garage right away. This includes oil or grease spills. What can I do in the bathroom?  Use night lights.  Install grab bars by the toilet and in the tub and shower. Do not use towel bars as grab bars.  Use non-skid mats or decals in the tub or shower.  If you need to sit down in the shower, use a plastic, non-slip stool.  Keep the floor dry. Clean up any water that spills on the floor as soon as it happens.  Remove soap buildup in the tub or shower regularly.  Attach bath mats securely with double-sided non-slip rug tape.  Do not have throw rugs and other things on  the floor that can make you trip. What can I do in the bedroom?  Use night lights.  Make sure that you have a light by your bed that is easy to reach.  Do not use any sheets or blankets that are too big for your bed. They should not hang down onto the floor.  Have a firm chair that has side arms. You can use this for support while you get dressed.  Do not have throw rugs and other things on the floor that can make you trip. What can I do in the kitchen?  Clean up any spills right away.  Avoid walking on wet floors.  Keep items that you use a lot in easy-to-reach places.  If you need to reach something above you, use a strong step stool that has a grab bar.  Keep electrical cords out of the way.  Do not use floor polish or wax that makes floors slippery. If you must use wax, use non-skid floor wax.  Do not have throw rugs and other things on the floor that can make you trip. What can I do with my stairs?  Do not leave any items on the stairs.  Make sure  that there are handrails on both sides of the stairs and use them. Fix handrails that are broken or loose. Make sure that handrails are as long as the stairways.  Check any carpeting to make sure that it is firmly attached to the stairs. Fix any carpet that is loose or worn.  Avoid having throw rugs at the top or bottom of the stairs. If you do have throw rugs, attach them to the floor with carpet tape.  Make sure that you have a light switch at the top of the stairs and the bottom of the stairs. If you do not have them, ask someone to add them for you. What else can I do to help prevent falls?  Wear shoes that:  Do not have high heels.  Have rubber bottoms.  Are comfortable and fit you well.  Are closed at the toe. Do not wear sandals.  If you use a stepladder:  Make sure that it is fully opened. Do not climb a closed stepladder.  Make sure that both sides of the stepladder are locked into place.  Ask someone to  hold it for you, if possible.  Clearly mark and make sure that you can see:  Any grab bars or handrails.  First and last steps.  Where the edge of each step is.  Use tools that help you move around (mobility aids) if they are needed. These include:  Canes.  Walkers.  Scooters.  Crutches.  Turn on the lights when you go into a dark area. Replace any light bulbs as soon as they burn out.  Set up your furniture so you have a clear path. Avoid moving your furniture around.  If any of your floors are uneven, fix them.  If there are any pets around you, be aware of where they are.  Review your medicines with your doctor. Some medicines can make you feel dizzy. This can increase your chance of falling. Ask your doctor what other things that you can do to help prevent falls. This information is not intended to replace advice given to you by your health care provider. Make sure you discuss any questions you have with your health care provider. Document Released: 11/23/2008 Document Revised: 07/05/2015 Document Reviewed: 03/03/2014 Elsevier Interactive Patient Education  2017 [employer].

## 2018-05-27 NOTE — Progress Notes (Signed)
Subjective:   Teresa Stanley is a 73 y.o. female who presents for Medicare Annual (Subsequent) preventive examination.  Review of Systems:  n/a Cardiac Risk Factors include: diabetes mellitus;advanced age (>104men, >42 women);hypertension;obesity (BMI >30kg/m2);sedentary lifestyle     Objective:     Vitals: BP (!) 160/90 (BP Location: Left Arm, Patient Position: Sitting, Cuff Size: Large)   Pulse 84   Temp 98.3 F (36.8 C) (Oral)   Ht 5\' 2"  (1.575 m)   Wt 262 lb (118.8 kg)   SpO2 94%   BMI 47.92 kg/m   Body mass index is 47.92 kg/m.  Advanced Directives 05/27/2018 01/16/2016 11/27/2015 11/16/2015 04/04/2013 03/24/2013  Does Patient Have a Medical Advance Directive? Yes Yes No No Patient has advance directive, copy not in chart Patient has advance directive, copy not in chart  Type of Advance Directive [place];Living will - - - Healthcare Power of [place]  Does patient want to make changes to medical advance directive? No - Patient declined - - - No change requested -  Copy of [place] in Chart? No - copy requested - - - - Copy requested from family  Would patient like information on creating a medical advance directive? - - No - patient declined information - - -    Tobacco Social History   Tobacco Use  Smoking Status Never Smoker  Smokeless Tobacco Never Used     Counseling given: Not Answered   Clinical Intake:  Pre-visit preparation completed: Yes  Pain : 0-10 Pain Score: 7  Pain Type: Acute pain Pain Location: Shoulder Pain Orientation: Left Pain Radiating Towards: down left arm Pain Descriptors / Indicators: Aching Pain Onset: More than a month ago(2 months) Pain Frequency: Intermittent Pain Relieving Factors: tylenol Effect of Pain on Daily Activities: none  Pain Relieving Factors: tylenol  Nutritional Status: BMI > 30  Obese Nutritional Risks: None Diabetes: Yes CBG done?: No Did  pt. bring in CBG monitor from home?: No  How often do you need to have someone help you when you read instructions, pamphlets, or other written materials from your doctor or pharmacy?: 1 - Never What is the last grade level you completed in school?: 12th grade     Information entered by :: NAllen LPN  Past Medical History:  Diagnosis Date  . Arthritis   . Diabetes mellitus    takes Actos and Metformin daily and Victoza  . GERD (gastroesophageal reflux disease)    takes Omeprazole daily  . Headache(784.0)    occasionally  . Hyperlipidemia    takes Atorvastatin daily  . Hypertension    takes Lisinopril,Amlodipine,and Metoprolol daily  . Hypothyroidism   . Joint pain   . Thyroid disease    ?   Past Surgical History:  Procedure Laterality Date  . ABDOMINAL HYSTERECTOMY    . BREAST BIOPSY Left   . CARDIAC CATHETERIZATION  2009  . CATARACT EXTRACTION Right   . EYE SURGERY    . JOINT REPLACEMENT    . KNEE ARTHROPLASTY Left 04/04/2013   Procedure: COMPUTER ASSISTED TOTAL KNEE ARTHROPLASTY;  Surgeon: Marybelle Killings, MD;  Location: [facility];  Service: Orthopedics;  Laterality: Left;  Left Total Knee Arthroplasty, Cemented  . TOTAL KNEE ARTHROPLASTY Right 11/26/2015   Procedure: TOTAL KNEE ARTHROPLASTY;  Surgeon: Marybelle Killings, MD;  Location: [facility];  Service: Orthopedics;  Laterality: Right;   Family History  Problem Relation Age of Onset  . Diabetes Mother   .  Hypertension Mother   . Diabetes Father   . Asthma Father   . Hypertension Father   . Hypertension Daughter   . Hypertension Sister        2  . Diabetes Sister   . Hypertension Brother        5  . Kidney disease Brother   . Colon cancer Neg Hx   . Esophageal cancer Neg Hx   . Rectal cancer Neg Hx   . Stomach cancer Neg Hx    Social History   Socioeconomic History  . Marital status: Widowed    Spouse name: Not on file  . Number of children: 4  . Years of education: Not on file  . Highest education level: Not on  file  Occupational History  . Occupation: retired  Scientific laboratory technician  . Financial resource strain: Not hard at all  . Food insecurity:    Worry: Never true    Inability: Never true  . Transportation needs:    Medical: No    Non-medical: No  Tobacco Use  . Smoking status: Never Smoker  . Smokeless tobacco: Never Used  Substance and Sexual Activity  . Alcohol use: No  . Drug use: No  . Sexual activity: Not Currently    Birth control/protection: Surgical  Lifestyle  . Physical activity:    Days per week: 0 days    Minutes per session: 0 min  . Stress: Not at all  Relationships  . Social connections:    Talks on phone: Not on file    Gets together: Not on file    Attends religious service: Not on file    Active member of club or organization: Not on file    Attends meetings of clubs or organizations: Not on file    Relationship status: Not on file  Other Topics Concern  . Not on file  Social History Narrative  . Not on file    Outpatient Encounter Medications as of 05/27/2018  Medication Sig  . amLODipine (NORVASC) 10 MG tablet Take 10 mg by mouth daily.  [name] aspirin 81 MG tablet Take 81 mg by mouth daily.  [name] atorvastatin (LIPITOR) 20 MG tablet TAKE 1 TABLET BY ORAL ROUTE EVERY DAY  . carboxymethylcellulose (REFRESH PLUS) 0.5 % SOLN Place 1 drop into both eyes daily.  . meclizine (ANTIVERT) 25 MG tablet Take 1 tablet (25 mg total) by mouth 3 (three) times daily as needed for dizziness.  . metFORMIN (GLUCOPHAGE) 1000 MG tablet TAKE 1 TABLET BY MOUTH TWICE A DAY WITH MORNING AND EVENING MEALS (Patient taking differently: 1 tab daily)  . methimazole (TAPAZOLE) 5 MG tablet TAKE 1 TABLET BY MOUTH EVERY DAY  . omeprazole (PRILOSEC) 20 MG capsule TAKE ONE CAPSULE BY MOUTH BEFORE A MEAL  . propranolol ER (INDERAL LA) 120 MG 24 hr capsule TAKE 1 CAPSULE BY MOUTH EVERY DAY  . Semaglutide,0.25 or 0.5MG /DOS, (OZEMPIC, 0.25 OR 0.5 MG/DOSE,) 2 MG/1.5ML SOPN Inject 0.5 mg into the skin once a  week. (Patient taking differently: Inject 0.5 mg into the skin once a week. Had not taken in 3 weeks due to running out)  . triamcinolone cream (KENALOG) 0.1 % 1 application daily as needed.  . triamterene-hydrochlorothiazide (MAXZIDE-25) 37.5-25 MG tablet TAKE 1 TABLET BY MOUTH EVERY DAY IN THE MORNING  . pneumococcal 13-valent conjugate vaccine (PREVNAR 13) SUSP injection Inject 0.5 mLs into the muscle tomorrow at 10 am for 1 dose.   No facility-administered encounter medications on file as  of 05/27/2018.     Activities of Daily Living In your present state of health, do you have any difficulty performing the following activities: 05/27/2018  Hearing? N  Vision? N  Difficulty concentrating or making decisions? N  Walking or climbing stairs? Y  Comment uses cane  Dressing or bathing? N  Doing errands, shopping? N  Preparing Food and eating ? N  Using the Toilet? N  In the past six months, have you accidently leaked urine? N  Do you have problems with loss of bowel control? N  Managing your Medications? N  Managing your Finances? N  Housekeeping or managing your Housekeeping? N  Some recent data might be hidden    Patient Care Team: [name], MD as PCP - General (Internal Medicine)    Assessment:   This is a routine wellness examination for Teresa Stanley.  Exercise Activities and Dietary recommendations Current Exercise Habits: The patient does not participate in regular exercise at present  Goals    . Patient Stated     None        Fall Risk Fall Risk  05/27/2018 02/01/2018 12/29/2017 11/12/2017 07/02/2015  Falls in the past year? 0 0 0 No No  Risk for fall due to : Medication side effect;Impaired balance/gait - - - -  Follow up Falls prevention discussed;Education provided - - - -   Is the patient's home free of loose throw rugs in walkways, pet beds, electrical cords, etc?   yes      Grab bars in the bathroom? no      Handrails on the stairs?  n/a      Adequate  lighting?   yes  Timed Get Up and Go performed: n/a  Depression Screen PHQ 2/9 Scores 05/27/2018 02/01/2018 12/29/2017 11/12/2017  PHQ - 2 Score 0 0 0 0  PHQ- 9 Score 0 - - -     Cognitive Function     6CIT Screen 05/27/2018  What Year? 0 points  What month? 0 points  What time? 0 points  Count back from 20 0 points  Months in reverse 2 points  Repeat phrase 2 points  Total Score 4    Immunization History  Administered Date(s) Administered  . Influenza, High Dose Seasonal PF 11/12/2017  . Pneumococcal Polysaccharide-23 11/29/2015    Qualifies for Shingles Vaccine? yes  Screening Tests Health Maintenance  Topic Date Due  . FOOT EXAM  10/26/1955  . PNA vac Low Risk Adult (2 of 2 - PCV13) 11/28/2016  . OPHTHALMOLOGY EXAM  07/17/2018  . HEMOGLOBIN A1C  08/03/2018  . INFLUENZA VACCINE  09/11/2018  . URINE MICROALBUMIN  02/02/2019  . MAMMOGRAM  04/18/2020  . COLONOSCOPY  05/02/2024  . TETANUS/TDAP  08/12/2027  . DEXA SCAN  Completed  . Hepatitis C Screening  Completed    Cancer Screenings: Lung: Low Dose CT Chest recommended if Age 49-80 years, 30 pack-year currently smoking OR have quit w/in 15years. Patient does not qualify. Breast:  Up to date on Mammogram? Yes   Up to date of Bone Density/Dexa? Yes Colorectal: up to date  Additional Screenings: : Hepatitis C Screening: 02/01/2018     Plan:   No goals set  I have personally reviewed and noted the following in the patient's chart:   . Medical and social history . Use of alcohol, tobacco or illicit drugs  . Current medications and supplements . Functional ability and status . Nutritional status . Physical activity . Advanced directives . List of  other physicians . Hospitalizations, surgeries, and ER visits in previous 12 months . Vitals . Screenings to include cognitive, depression, and falls . Referrals and appointments  In addition, I have reviewed and discussed with patient certain preventive  protocols, quality metrics, and best practice recommendations. A written personalized care plan for preventive services as well as general preventive health recommendations were provided to patient.     Kellie Simmering, LPN  [id number]

## 2018-05-28 ENCOUNTER — Other Ambulatory Visit: Payer: Self-pay | Admitting: Internal Medicine

## 2018-05-28 DIAGNOSIS — E66813 Morbid (severe) obesity due to excess calories: Secondary | ICD-10-CM | POA: Insufficient documentation

## 2018-05-28 DIAGNOSIS — Z6841 Body Mass Index (BMI) 40.0 and over, adult: Secondary | ICD-10-CM

## 2018-05-28 NOTE — Progress Notes (Signed)
Subjective:     Patient ID: Teresa Stanley , female    DOB: 09/22/1945 , 73 y.o.   MRN: [id number]   Chief Complaint  Patient presents with  . Diabetes  . Hypertension    HPI  She is here today for DM check. Admits she ran out of Ozempic 3 weeks ago. It has yet to be approved by her insurance. Admits she did not call for samples.   Diabetes  She presents for her follow-up diabetic visit. She has type 2 diabetes mellitus. Her disease course has been stable. There are no hypoglycemic associated symptoms. Pertinent negatives for diabetes include no blurred vision and no chest pain. There are no hypoglycemic complications. Diabetic complications include nephropathy. Risk factors for coronary artery disease include diabetes mellitus, dyslipidemia, obesity, post-menopausal and sedentary lifestyle. She is compliant with treatment some of the time. Her weight is decreasing steadily. She is following a generally healthy diet. She participates in exercise intermittently. Her breakfast blood glucose is taken between 8-9 am. Her breakfast blood glucose range is generally 130-140 mg/dl. An ACE inhibitor/angiotensin II receptor blocker is being taken. Eye exam is current.  Hypertension  The current episode started more than 1 year ago. The problem has been gradually improving since onset. The problem is uncontrolled. Pertinent negatives include no blurred vision, chest pain, palpitations or shortness of breath.   She admits that she has yet to take her meds today.   Past Medical History:  Diagnosis Date  . Arthritis   . Diabetes mellitus    takes Actos and Metformin daily and Victoza  . GERD (gastroesophageal reflux disease)    takes Omeprazole daily  . Headache(784.0)    occasionally  . Hyperlipidemia    takes Atorvastatin daily  . Hypertension    takes Lisinopril,Amlodipine,and Metoprolol daily  . Hypothyroidism   . Joint pain   . Thyroid disease    ?     Family History  Problem Relation  Age of Onset  . Diabetes Mother   . Hypertension Mother   . Diabetes Father   . Asthma Father   . Hypertension Father   . Hypertension Daughter   . Hypertension Sister        2  . Diabetes Sister   . Hypertension Brother        5  . Kidney disease Brother   . Colon cancer Neg Hx   . Esophageal cancer Neg Hx   . Rectal cancer Neg Hx   . Stomach cancer Neg Hx      Current Outpatient Medications:  .  amLODipine (NORVASC) 10 MG tablet, Take 10 mg by mouth daily., Disp: , Rfl:  .  aspirin 81 MG tablet, Take 81 mg by mouth daily., Disp: , Rfl:  .  atorvastatin (LIPITOR) 20 MG tablet, TAKE 1 TABLET BY ORAL ROUTE EVERY DAY, Disp: 90 tablet, Rfl: 2 .  carboxymethylcellulose (REFRESH PLUS) 0.5 % SOLN, Place 1 drop into both eyes daily., Disp: , Rfl:  .  meclizine (ANTIVERT) 25 MG tablet, Take 1 tablet (25 mg total) by mouth 3 (three) times daily as needed for dizziness., Disp: 30 tablet, Rfl: 0 .  metFORMIN (GLUCOPHAGE) 1000 MG tablet, Take 1 tablet (1,000 mg total) by mouth daily. 1 tab daily, Disp: 90 tablet, Rfl: 0 .  methimazole (TAPAZOLE) 5 MG tablet, TAKE 1 TABLET BY MOUTH EVERY DAY, Disp: 30 tablet, Rfl: 2 .  omeprazole (PRILOSEC) 20 MG capsule, TAKE ONE CAPSULE BY MOUTH BEFORE A MEAL,  Disp: 90 capsule, Rfl: 2 .  propranolol ER (INDERAL LA) 120 MG 24 hr capsule, TAKE 1 CAPSULE BY MOUTH EVERY DAY, Disp: 90 capsule, Rfl: 2 .  Semaglutide,0.25 or 0.5MG/DOS, (OZEMPIC, 0.25 OR 0.5 MG/DOSE,) 2 MG/1.5ML SOPN, Inject 0.5 mg into the skin once a week., Disp: 3 pen, Rfl: 1 .  triamcinolone cream (KENALOG) 0.1 %, 1 application daily as needed., Disp: , Rfl:  .  triamterene-hydrochlorothiazide (MAXZIDE-25) 37.5-25 MG tablet, TAKE 1 TABLET BY MOUTH EVERY DAY IN THE MORNING, Disp: 90 tablet, Rfl: 1   No Known Allergies   Review of Systems  Constitutional: Negative.   Eyes: Negative for blurred vision.  Respiratory: Negative.  Negative for shortness of breath.   Cardiovascular: Negative.   Negative for chest pain and palpitations.  Gastrointestinal: Negative.   Neurological: Negative.   Psychiatric/Behavioral: Negative.      Today's Vitals   05/27/18 1141  BP: (!) 160/90  Pulse: 84  Temp: 98.3 F (36.8 C)  TempSrc: Oral  Weight: 262 lb (118.8 kg)  Height: _0  (1.575 m)  PainSc: 7   PainLoc: Shoulder   Body mass index is 47.92 kg/m.   Objective:  Physical Exam Vitals signs and nursing note reviewed.  Constitutional:      Appearance: Normal appearance.  HENT:     Head: Normocephalic and atraumatic.  Cardiovascular:     Rate and Rhythm: Normal rate and regular rhythm.     Heart sounds: Normal heart sounds.  Pulmonary:     Effort: Pulmonary effort is normal.     Breath sounds: Normal breath sounds.  Skin:    General: Skin is warm.  Neurological:     General: No focal deficit present.     Mental Status: She is alert.  Psychiatric:        Mood and Affect: Mood normal.        Behavior: Behavior normal.         Assessment And Plan:     1. Diabetes mellitus with stage 3 chronic kidney disease ([place])  I will check labs as listed below. Importance of dietary compliance was discussed with the patient. A rx for Ozempic was sent to the pharmacy. Pt advised that a PA may be required. She was given a sample to use as well. She is advised to resume dosing at 0.65m once weekly x 2 weeks, then she may increase to 0.576monce weekly.  - BMP8+EGFR - Hemoglobin A1c - Lipid panel - Protein electrophoresis, serum - Phosphorus  2. Hypertensive nephropathy  Uncontrolled. Importance of dietary and medication compliance was discussed with the patient. Importance of salt restriction was discussed with the patient. She will rto in 4-6 weeks for re-evaluation.   3. Class 3 severe obesity due to excess calories with serious comorbidity and body mass index (BMI) of 45.0 to 49.9 in adult [facility] Importance of achieving optimal weight to decrease risk of cardiovascular  disease and cancers was discussed with the patient in full detail. She is encouraged to start slowly - start with 10 minutes twice daily at least three to four days per week and to gradually build to 30 minutes five days weekly. She was given tips to incorporate more activity into her daily routine - take stairs when possible, park farther away from grocery stores, etc.  She is encouraged to perform chair exercises while watching TV as well.   RoMaximino GreenlandMD    THE PATIENT IS ENCOURAGED TO PRACTICE SOCIAL DISTANCING DUE  TO THE COVID-19 PANDEMIC.

## 2018-05-29 ENCOUNTER — Other Ambulatory Visit: Payer: Self-pay | Admitting: Internal Medicine

## 2018-06-01 ENCOUNTER — Other Ambulatory Visit: Payer: Self-pay

## 2018-06-01 MED ORDER — SEMAGLUTIDE(0.25 OR 0.5MG/DOS) 2 MG/1.5ML ~~LOC~~ SOPN: 0.5000 mg | PEN_INJECTOR | SUBCUTANEOUS | 1 refills | Status: DC

## 2018-06-01 NOTE — Telephone Encounter (Signed)
Patient notified that ozempic approved under her insurance plan.

## 2018-06-02 ENCOUNTER — Telehealth: Payer: Self-pay

## 2018-06-02 ENCOUNTER — Other Ambulatory Visit: Payer: Self-pay

## 2018-06-02 DIAGNOSIS — N183 Chronic kidney disease, stage 3 (moderate): Secondary | ICD-10-CM | POA: Diagnosis not present

## 2018-06-02 DIAGNOSIS — E1122 Type 2 diabetes mellitus with diabetic chronic kidney disease: Secondary | ICD-10-CM | POA: Diagnosis not present

## 2018-06-02 MED ORDER — ZOSTER VAC RECOMB ADJUVANTED 50 MCG/0.5ML IM SUSR: 0.5000 mL | Freq: Once | INTRAMUSCULAR | 0 refills | Status: AC

## 2018-06-02 NOTE — Telephone Encounter (Signed)
I called pharmacy and the ozempic is $140

## 2018-06-02 NOTE — Telephone Encounter (Signed)
Patient notified that Dr. Baird Cancer sent a prescription in for a Shingrix vaccination to the pt's pharmacy.

## 2018-06-03 ENCOUNTER — Telehealth: Payer: Self-pay

## 2018-06-03 NOTE — Telephone Encounter (Signed)
Unable to leave message. ($140)  Please contact the pt and let her know this is a great price for a 3 month supply.    If she is not able to afford this, we may need to switch her to insulin which is a daily injection.   Let me know how we should proceed. She can also tell the pharmacy she only wants to get one month at a time. However, it may be more expensive.

## 2018-06-03 NOTE — Telephone Encounter (Signed)
-----   Message from [name], MD sent at 06/03/2018  9:47 AM EDT ----- Please contact the pt and let her know this is a great price for a 3 month supply.    If she is not able to afford this, we may need to switch her to insulin which is a daily injection.   Let me know how we should proceed. She can also tell the pharmacy she only wants to get one month at a time. However, it may be more expensive.  ----- Message ----- From: Octavio Manns Sent: 06/03/2018   9:00 AM EDT To: [name], MD  I called the pharmacy and the $140 is for a 3 month prescription.

## 2018-06-04 ENCOUNTER — Telehealth: Payer: Self-pay

## 2018-06-04 LAB — BMP8+EGFR
BUN/Creatinine Ratio: 17 (ref 12–28)
BUN: 24 mg/dL (ref 8–27)
CO2: 21 mmol/L (ref 20–29)
Calcium: 9.2 mg/dL (ref 8.7–10.3)
Chloride: 101 mmol/L (ref 96–106)
Creatinine, Ser: 1.4 mg/dL — ABNORMAL HIGH (ref 0.57–1.00)
GFR calc Af Amer: 44 mL/min/{1.73_m2} — ABNORMAL LOW (ref >59)
GFR calc non Af Amer: 38 mL/min/{1.73_m2} — ABNORMAL LOW (ref >59)
Glucose: 244 mg/dL — ABNORMAL HIGH (ref 65–99)
Potassium: 5.8 mmol/L — ABNORMAL HIGH (ref 3.5–5.2)
Sodium: 137 mmol/L (ref 134–144)

## 2018-06-04 LAB — PHOSPHORUS: Phosphorus: 3.5 mg/dL (ref 3.0–4.3)

## 2018-06-04 LAB — PROTEIN ELECTROPHORESIS, SERUM
A/G Ratio: 1.1 (ref 0.7–1.7)
Albumin ELP: 3.5 g/dL (ref 2.9–4.4)
Alpha 1: 0.2 g/dL (ref 0.0–0.4)
Alpha 2: 1 g/dL (ref 0.4–1.0)
Beta: 1.1 g/dL (ref 0.7–1.3)
Gamma Globulin: 0.9 g/dL (ref 0.4–1.8)
Globulin, Total: 3.2 g/dL (ref 2.2–3.9)
Total Protein: 6.7 g/dL (ref 6.0–8.5)

## 2018-06-04 LAB — LIPID PANEL
Chol/HDL Ratio: 2.5 ratio (ref 0.0–4.4)
Cholesterol, Total: 154 mg/dL (ref 100–199)
HDL: 61 mg/dL (ref >39)
LDL Calculated: 74 mg/dL (ref 0–99)
Triglycerides: 94 mg/dL (ref 0–149)
VLDL Cholesterol Cal: 19 mg/dL (ref 5–40)

## 2018-06-04 LAB — HEMOGLOBIN A1C
Est. average glucose Bld gHb Est-mCnc: 200 mg/dL
Hgb A1c MFr Bld: 8.6 % — ABNORMAL HIGH (ref 4.8–5.6)

## 2018-06-04 NOTE — Telephone Encounter (Signed)
Explained to pt about cost of rx. Pt has not picked it up from the pharm but does have sample. Stated that she is trying to get the money together. Encouraged to call the office with any other concerns.

## 2018-06-04 NOTE — Telephone Encounter (Signed)
-----   Message from [name], MD sent at 06/03/2018  9:47 AM EDT ----- Please contact the pt and let her know this is a great price for a 3 month supply.    If she is not able to afford this, we may need to switch her to insulin which is a daily injection.   Let me know how we should proceed. She can also tell the pharmacy she only wants to get one month at a time. However, it may be more expensive.  ----- Message ----- From: Octavio Manns Sent: 06/03/2018   9:00 AM EDT To: [name], MD  I called the pharmacy and the $140 is for a 3 month prescription.

## 2018-06-04 NOTE — Telephone Encounter (Signed)
error 

## 2018-06-08 ENCOUNTER — Other Ambulatory Visit: Payer: Self-pay

## 2018-06-08 MED ORDER — GLUCOSE BLOOD VI STRP: ORAL_STRIP | 2 refills | Status: DC

## 2018-06-08 MED ORDER — MECLIZINE HCL 25 MG PO TABS: 25.0000 mg | ORAL_TABLET | Freq: Three times a day (TID) | ORAL | 0 refills | Status: DC | PRN

## 2018-06-08 MED ORDER — ONETOUCH ULTRA 2 W/DEVICE KIT: PACK | 1 refills | Status: DC

## 2018-06-08 MED ORDER — ONETOUCH DELICA LANCETS 33G MISC: 2 refills | Status: DC

## 2018-06-24 ENCOUNTER — Other Ambulatory Visit: Payer: Self-pay | Admitting: Internal Medicine

## 2018-07-06 ENCOUNTER — Ambulatory Visit (INDEPENDENT_AMBULATORY_CARE_PROVIDER_SITE_OTHER): Payer: Medicare Other | Admitting: Internal Medicine

## 2018-07-06 ENCOUNTER — Other Ambulatory Visit: Payer: Self-pay

## 2018-07-06 ENCOUNTER — Encounter: Payer: Self-pay | Admitting: Internal Medicine

## 2018-07-06 VITALS — BP 126/78 | HR 84 | Temp 98.3°F | Ht 62.0 in | Wt 261.8 lb

## 2018-07-06 DIAGNOSIS — N183 Chronic kidney disease, stage 3 unspecified: Secondary | ICD-10-CM

## 2018-07-06 DIAGNOSIS — E1122 Type 2 diabetes mellitus with diabetic chronic kidney disease: Secondary | ICD-10-CM

## 2018-07-06 DIAGNOSIS — I129 Hypertensive chronic kidney disease with stage 1 through stage 4 chronic kidney disease, or unspecified chronic kidney disease: Secondary | ICD-10-CM | POA: Diagnosis not present

## 2018-07-07 NOTE — Progress Notes (Signed)
Subjective:     Patient ID: Teresa Stanley , female    DOB: Jun 21, 1945 , 73 y.o.   MRN: [id number]   Chief Complaint  Patient presents with  . Hypertension    HPI  She is here today for f/u elevated blood pressure. She admits that she has been more compliant with meds/diet since her last visit. She has not had any issues with her medications. Her bp was 160/90 at her last visit. She admits that she had not been taking her meds on a regular basis.   Additionally, she reports that she has been taking Ozempic more regularly. She is tolerating 0.59m once weekly. She has yet to start exercising regularly.     Past Medical History:  Diagnosis Date  . Arthritis   . Diabetes mellitus    takes Actos and Metformin daily and Victoza  . GERD (gastroesophageal reflux disease)    takes Omeprazole daily  . Headache(784.0)    occasionally  . Hyperlipidemia    takes Atorvastatin daily  . Hypertension    takes Lisinopril,Amlodipine,and Metoprolol daily  . Hypothyroidism   . Joint pain   . Thyroid disease    ?     Family History  Problem Relation Age of Onset  . Diabetes Mother   . Hypertension Mother   . Diabetes Father   . Asthma Father   . Hypertension Father   . Hypertension Daughter   . Hypertension Sister        2  . Diabetes Sister   . Hypertension Brother        5  . Kidney disease Brother   . Colon cancer Neg Hx   . Esophageal cancer Neg Hx   . Rectal cancer Neg Hx   . Stomach cancer Neg Hx      Current Outpatient Medications:  .  amLODipine (NORVASC) 10 MG tablet, Take 10 mg by mouth daily., Disp: , Rfl:  .  aspirin 81 MG tablet, Take 81 mg by mouth daily., Disp: , Rfl:  .  atorvastatin (LIPITOR) 20 MG tablet, TAKE 1 TABLET BY ORAL ROUTE EVERY DAY, Disp: 90 tablet, Rfl: 2 .  Blood Glucose Monitoring Suppl (ONE TOUCH ULTRA 2) w/Device KIT, Use as directed to check blood sugars 2 times per day dx:e11.22, Disp: 1 each, Rfl: 1 .  carboxymethylcellulose (REFRESH PLUS)  0.5 % SOLN, Place 1 drop into both eyes daily., Disp: , Rfl:  .  glucose blood (ONE TOUCH ULTRA TEST) test strip, Use as instructed to check blood sugars 2 times per day dx: e11.22, Disp: 300 each, Rfl: 2 .  meclizine (ANTIVERT) 25 MG tablet, Take 1 tablet (25 mg total) by mouth 3 (three) times daily as needed for dizziness., Disp: 30 tablet, Rfl: 0 .  metFORMIN (GLUCOPHAGE) 1000 MG tablet, Take 1 tablet (1,000 mg total) by mouth daily. 1 tab daily, Disp: 90 tablet, Rfl: 0 .  methimazole (TAPAZOLE) 5 MG tablet, TAKE 1 TABLET BY MOUTH EVERY DAY, Disp: 90 tablet, Rfl: 1 .  omeprazole (PRILOSEC) 20 MG capsule, TAKE ONE CAPSULE BY MOUTH BEFORE A MEAL, Disp: 90 capsule, Rfl: 2 .  OneTouch Delica Lancets 340JMISC, Use as directed to check blood sugars 2 times per day dx: e11.22, Disp: 300 each, Rfl: 2 .  propranolol ER (INDERAL LA) 120 MG 24 hr capsule, TAKE 1 CAPSULE BY MOUTH EVERY DAY, Disp: 90 capsule, Rfl: 2 .  Semaglutide,0.25 or 0.5MG/DOS, (OZEMPIC, 0.25 OR 0.5 MG/DOSE,) 2 MG/1.5ML SOPN, Inject 0.5  mg into the skin once a week., Disp: 3 pen, Rfl: 1 .  triamcinolone cream (KENALOG) 0.1 %, 1 application daily as needed., Disp: , Rfl:  .  triamterene-hydrochlorothiazide (MAXZIDE-25) 37.5-25 MG tablet, TAKE 1 TABLET BY MOUTH EVERY DAY IN THE MORNING, Disp: 90 tablet, Rfl: 1   No Known Allergies   Review of Systems  Constitutional: Negative.   Respiratory: Negative.   Cardiovascular: Negative.   Gastrointestinal: Negative.   Neurological: Negative.   Psychiatric/Behavioral: Negative.      Today's Vitals   07/06/18 1123  BP: 126/78  Pulse: 84  Temp: 98.3 F (36.8 C)  TempSrc: Oral  Weight: 261 lb 12.8 oz (118.8 kg)  Height: '5\' 2"'  (1.575 m)  PainSc: 7   PainLoc: Back   Body mass index is 47.88 kg/m.   Objective:  Physical Exam Vitals signs and nursing note reviewed.  Constitutional:      Appearance: Normal appearance.  HENT:     Head: Normocephalic and atraumatic.   Cardiovascular:     Rate and Rhythm: Normal rate and regular rhythm.     Heart sounds: Normal heart sounds.  Pulmonary:     Effort: Pulmonary effort is normal.     Breath sounds: Normal breath sounds.  Skin:    General: Skin is warm.  Neurological:     General: No focal deficit present.     Mental Status: She is alert.  Psychiatric:        Mood and Affect: Mood normal.        Behavior: Behavior normal.         Assessment And Plan:     1. Hypertensive nephropathy  Well controlled. She will continue with current meds. She is encouraged to continue to avoid adding salt to her foods.   2. Chronic renal disease, stage III ([place])  This has been stable. I will recheck at her next visit.   3. Diabetes mellitus with stage 3 chronic kidney disease ([place])  She will continue with Ozempic, 0.1m once weekly. She is encouraged to take meds as directed. Also encouraged to incorporate more activity into her daily routine. She is encouraged to perform chair exercises five days per week. Pt reminded that she must move more now if she wants to remain mobile as she ages.   RMaximino Greenland MD    THE PATIENT IS ENCOURAGED TO PRACTICE SOCIAL DISTANCING DUE TO THE COVID-19 PANDEMIC.

## 2018-07-13 ENCOUNTER — Other Ambulatory Visit: Payer: Self-pay | Admitting: Internal Medicine

## 2018-07-23 ENCOUNTER — Other Ambulatory Visit: Payer: Self-pay | Admitting: Internal Medicine

## 2018-08-24 ENCOUNTER — Other Ambulatory Visit: Payer: Self-pay | Admitting: Internal Medicine

## 2018-08-25 ENCOUNTER — Other Ambulatory Visit: Payer: Self-pay | Admitting: Internal Medicine

## 2018-09-01 ENCOUNTER — Encounter: Payer: Self-pay | Admitting: Internal Medicine

## 2018-09-01 ENCOUNTER — Ambulatory Visit (INDEPENDENT_AMBULATORY_CARE_PROVIDER_SITE_OTHER): Payer: Medicare Other | Admitting: Internal Medicine

## 2018-09-01 ENCOUNTER — Other Ambulatory Visit: Payer: Self-pay

## 2018-09-01 VITALS — BP 124/86 | HR 88 | Temp 97.6°F | Ht 62.0 in | Wt 265.0 lb

## 2018-09-01 DIAGNOSIS — E1122 Type 2 diabetes mellitus with diabetic chronic kidney disease: Secondary | ICD-10-CM

## 2018-09-01 DIAGNOSIS — N183 Chronic kidney disease, stage 3 unspecified: Secondary | ICD-10-CM

## 2018-09-01 DIAGNOSIS — I129 Hypertensive chronic kidney disease with stage 1 through stage 4 chronic kidney disease, or unspecified chronic kidney disease: Secondary | ICD-10-CM

## 2018-09-01 DIAGNOSIS — Z6841 Body Mass Index (BMI) 40.0 and over, adult: Secondary | ICD-10-CM

## 2018-09-01 NOTE — Patient Instructions (Signed)

## 2018-09-01 NOTE — Progress Notes (Signed)
Subjective:     Patient ID: Teresa Stanley , female    DOB: 1945/09/15 , 73 y.o.   MRN: [id number]   Chief Complaint  Patient presents with  . Diabetes  . Hypertension    HPI  Diabetes She presents for her follow-up diabetic visit. She has type 2 diabetes mellitus. Her disease course has been stable. There are no hypoglycemic associated symptoms. Pertinent negatives for diabetes include no blurred vision and no chest pain. There are no hypoglycemic complications. Diabetic complications include nephropathy. Risk factors for coronary artery disease include diabetes mellitus, dyslipidemia, obesity, post-menopausal and sedentary lifestyle. She is compliant with treatment some of the time. Her weight is decreasing steadily. She is following a generally healthy diet. She participates in exercise intermittently. Her breakfast blood glucose is taken between 8-9 am. Her breakfast blood glucose range is generally 130-140 mg/dl. An ACE inhibitor/angiotensin II receptor blocker is being taken. Eye exam is current.  Hypertension The current episode started more than 1 year ago. The problem has been gradually improving since onset. The problem is uncontrolled. Pertinent negatives include no blurred vision, chest pain, palpitations or shortness of breath. The current treatment provides moderate improvement. Compliance problems include exercise.  Hypertensive end-organ damage includes kidney disease.     Past Medical History:  Diagnosis Date  . Arthritis   . Diabetes mellitus    takes Actos and Metformin daily and Victoza  . GERD (gastroesophageal reflux disease)    takes Omeprazole daily  . Headache(784.0)    occasionally  . Hyperlipidemia    takes Atorvastatin daily  . Hypertension    takes Lisinopril,Amlodipine,and Metoprolol daily  . Hypothyroidism   . Joint pain   . Thyroid disease    ?     Family History  Problem Relation Age of Onset  . Diabetes Mother   . Hypertension Mother   .  Diabetes Father   . Asthma Father   . Hypertension Father   . Hypertension Daughter   . Hypertension Sister        2  . Diabetes Sister   . Hypertension Brother        5  . Kidney disease Brother   . Colon cancer Neg Hx   . Esophageal cancer Neg Hx   . Rectal cancer Neg Hx   . Stomach cancer Neg Hx      Current Outpatient Medications:  .  amLODipine (NORVASC) 10 MG tablet, TAKE 1 TABLET BY MOUTH EVERY DAY IN THE EVENING, Disp: 90 tablet, Rfl: 2 .  aspirin 81 MG tablet, Take 81 mg by mouth daily., Disp: , Rfl:  .  atorvastatin (LIPITOR) 20 MG tablet, TAKE 1 TABLET BY ORAL ROUTE EVERY DAY, Disp: 90 tablet, Rfl: 2 .  Blood Glucose Monitoring Suppl (ONE TOUCH ULTRA 2) w/Device KIT, Use as directed to check blood sugars 2 times per day dx:e11.22, Disp: 1 each, Rfl: 1 .  carboxymethylcellulose (REFRESH PLUS) 0.5 % SOLN, Place 1 drop into both eyes daily., Disp: , Rfl:  .  glucose blood (ONE TOUCH ULTRA TEST) test strip, Use as instructed to check blood sugars 2 times per day dx: e11.22, Disp: 300 each, Rfl: 2 .  meclizine (ANTIVERT) 25 MG tablet, TAKE 1 TABLET (25 MG TOTAL) BY MOUTH 3 (THREE) TIMES DAILY AS NEEDED FOR DIZZINESS., Disp: 30 tablet, Rfl: 0 .  metFORMIN (GLUCOPHAGE) 1000 MG tablet, TAKE 1 TABLET BY MOUTH EVERY DAY, Disp: 90 tablet, Rfl: 0 .  methimazole (TAPAZOLE) 5 MG tablet,  TAKE 1 TABLET BY MOUTH EVERY DAY, Disp: 90 tablet, Rfl: 1 .  omeprazole (PRILOSEC) 20 MG capsule, TAKE ONE CAPSULE BY MOUTH BEFORE A MEAL, Disp: 90 capsule, Rfl: 2 .  OneTouch Delica Lancets 07A MISC, Use as directed to check blood sugars 2 times per day dx: e11.22, Disp: 300 each, Rfl: 2 .  propranolol ER (INDERAL LA) 120 MG 24 hr capsule, TAKE 1 CAPSULE BY MOUTH EVERY DAY, Disp: 90 capsule, Rfl: 2 .  Semaglutide,0.25 or 0.5MG/DOS, (OZEMPIC, 0.25 OR 0.5 MG/DOSE,) 2 MG/1.5ML SOPN, Inject 0.5 mg into the skin once a week., Disp: 3 pen, Rfl: 1 .  triamcinolone cream (KENALOG) 0.1 %, 1 application daily as  needed., Disp: , Rfl:  .  triamterene-hydrochlorothiazide (MAXZIDE-25) 37.5-25 MG tablet, TAKE 1 TABLET BY MOUTH EVERY DAY IN THE MORNING, Disp: 90 tablet, Rfl: 1   No Known Allergies   Review of Systems  Constitutional: Negative.   Eyes: Negative for blurred vision.  Respiratory: Negative.  Negative for shortness of breath.   Cardiovascular: Negative.  Negative for chest pain and palpitations.  Gastrointestinal: Negative.   Neurological: Negative.   Psychiatric/Behavioral: Negative.      Today's Vitals   09/01/18 1132  BP: 124/86  Pulse: 88  Temp: 97.6 F (36.4 C)  TempSrc: Oral  Weight: 265 lb (120.2 kg)  Height: '5\' 2"'  (1.575 m)  PainSc: 0-No pain   Body mass index is 48.47 kg/m.   Objective:  Physical Exam Vitals signs and nursing note reviewed.  Constitutional:      Appearance: Normal appearance. She is obese.  HENT:     Head: Normocephalic and atraumatic.  Cardiovascular:     Rate and Rhythm: Normal rate and regular rhythm.     Heart sounds: Normal heart sounds.  Pulmonary:     Effort: Pulmonary effort is normal.     Breath sounds: Normal breath sounds.  Skin:    General: Skin is warm.  Neurological:     General: No focal deficit present.     Mental Status: She is alert.  Psychiatric:        Mood and Affect: Mood normal.        Behavior: Behavior normal.         Assessment And Plan:     1. Diabetes mellitus with stage 3 chronic kidney disease ([place])  I will check labs as listed below. Importance of dietary and medication compliance was discussed with the patient. Now taking metformin three days weekly, plan to d/c completely if kidney function continues to decline.   - Hemoglobin A1c - BMP8+EGFR  2. Hypertensive nephropathy  Chronic, well controlled. She will continue with current meds.   3. Class 3 severe obesity due to excess calories with serious comorbidity and body mass index (BMI) of 45.0 to 49.9 in adult [facility])  Importance of achieving  optimal weight to decrease risk of cardiovascular disease and cancers was discussed with the patient in full detail. She is encouraged to start slowly - start with 10 minutes twice daily at least three to four days per week. She is advised that chair exercises count as part of her daily activity as well.       Maximino Greenland, MD    THE PATIENT IS ENCOURAGED TO PRACTICE SOCIAL DISTANCING DUE TO THE COVID-19 PANDEMIC.

## 2018-09-02 LAB — BMP8+EGFR
BUN/Creatinine Ratio: 12 (ref 12–28)
BUN: 18 mg/dL (ref 8–27)
CO2: 23 mmol/L (ref 20–29)
Calcium: 9.9 mg/dL (ref 8.7–10.3)
Chloride: 96 mmol/L (ref 96–106)
Creatinine, Ser: 1.45 mg/dL — ABNORMAL HIGH (ref 0.57–1.00)
GFR calc Af Amer: 42 mL/min/{1.73_m2} — ABNORMAL LOW (ref >59)
GFR calc non Af Amer: 36 mL/min/{1.73_m2} — ABNORMAL LOW (ref >59)
Glucose: 203 mg/dL — ABNORMAL HIGH (ref 65–99)
Potassium: 5.8 mmol/L — ABNORMAL HIGH (ref 3.5–5.2)
Sodium: 136 mmol/L (ref 134–144)

## 2018-09-02 LAB — HEMOGLOBIN A1C
Est. average glucose Bld gHb Est-mCnc: 209 mg/dL
Hgb A1c MFr Bld: 8.9 % — ABNORMAL HIGH (ref 4.8–5.6)

## 2018-09-06 ENCOUNTER — Other Ambulatory Visit: Payer: Self-pay

## 2018-09-06 DIAGNOSIS — E1122 Type 2 diabetes mellitus with diabetic chronic kidney disease: Secondary | ICD-10-CM

## 2018-09-06 DIAGNOSIS — N183 Chronic kidney disease, stage 3 unspecified: Secondary | ICD-10-CM

## 2018-09-14 ENCOUNTER — Ambulatory Visit: Payer: Self-pay

## 2018-09-14 DIAGNOSIS — N183 Chronic kidney disease, stage 3 unspecified: Secondary | ICD-10-CM

## 2018-09-14 DIAGNOSIS — E1122 Type 2 diabetes mellitus with diabetic chronic kidney disease: Secondary | ICD-10-CM

## 2018-09-14 NOTE — Patient Instructions (Signed)
Social Worker Visit Information  Goals we discussed today:  Goals Addressed            This Visit's Progress     Patient Stated   . "I can't afford my Ozempic" (pt-stated)       Current Barriers:  . Financial constraints related to medication copay increases due to being in a coverage gap  Clinical Social Work Clinical Goal(s):  [name] Over the next 45 days, patient will work with embedded PharmD to address needs related to medication assistance  Interventions: . Patient interviewed and appropriate assessments performed - the patient reports her pharmacy has informed her the cost of Ozempic will be over $500 due to being in a coverage gap . Determined the patient does receive some financial assistance from her son to alleviate financial burden of the patients medications . Provided patient with information about patient assistance programs . Discussed plans with patient for ongoing care management follow up and provided patient with direct contact information for care management team . Advised patient to expect a call from embedded PharmD Lottie Dawson to assist with patient assistance application . Collaborated with RN Case Manager and PharmD re: patient recent enrollment  Patient Self Care Activities:  . Self administers medications as prescribed . Performs ADL's independently . Calls provider office for new concerns or questions  Initial goal documentation         Materials provided: Verbal education about CCM program provided by phone  Teresa Stanley was given information about Chronic Care Management services today including:  1. CCM service includes personalized support from designated clinical staff supervised by her physician, including individualized plan of care and coordination with other care providers 2. 24/7 contact phone numbers for assistance for urgent and routine care needs. 3. Service will only be billed when office clinical staff spend 20 minutes or more in a  month to coordinate care. 4. Only one practitioner may furnish and bill the service in a calendar month. 5. The patient may stop CCM services at any time (effective at the end of the month) by phone call to the office staff. 6. The patient will be responsible for cost sharing (co-pay) of up to 20% of the service fee (after annual deductible is met).  Patient agreed to services and verbal consent obtained.   The patient verbalized understanding of instructions provided today and declined a print copy of patient instruction materials.   Follow up plan: No planned SW follow up at this time. Please contact me for future resource needs.  Daneen Schick, BSW, CDP Social Worker, Certified Dementia Practitioner [facility] [phone]

## 2018-09-14 NOTE — Chronic Care Management (AMB) (Signed)
Chronic Care Management    Social Work General Note  09/14/2018 Name: Teresa Stanley MRN: [id number] DOB: [birth year]  Teresa Stanley is a 73 y.o. year old female who is a primary care patient of [name], MD. The CCM was consulted to assist the patient with disease management.  [name] was given information about Chronic Care Management services today including:  1. CCM service includes personalized support from designated clinical staff supervised by her physician, including individualized plan of care and coordination with other care providers 2. 24/7 contact phone numbers for assistance for urgent and routine care needs. 3. Service will only be billed when office clinical staff spend 20 minutes or more in a month to coordinate care. 4. Only one practitioner may furnish and bill the service in a calendar month. 5. The patient may stop CCM services at any time (effective at the end of the month) by phone call to the office staff. 6. The patient will be responsible for cost sharing (co-pay) of up to 20% of the service fee (after annual deductible is met).  Patient agreed to services and verbal consent obtained.   Review of patient status, including review of consultants reports, relevant laboratory and other test results, and collaboration with appropriate care team members and the patient's provider was performed as part of comprehensive patient evaluation and provision of chronic care management services.    SDOH (Social Determinants of Health) screening performed today. The patient does not identify any SDOH challenges at this time. The patient does report concern with medication costs. See care plan below:  Goals Addressed            This Visit's Progress     Patient Stated   . "I can't afford my Ozempic" (pt-stated)       Current Barriers:  . Financial constraints related to medication copay increases due to being in a coverage gap  Clinical Social Work Clinical Goal(s):   [name] Over the next 45 days, patient will work with embedded PharmD to address needs related to medication assistance  Interventions: . Patient interviewed and appropriate assessments performed - the patient reports her pharmacy has informed her the cost of Ozempic will be over $500 due to being in a coverage gap . Determined the patient does receive some financial assistance from her son to alleviate financial burden of the patients medications . Provided patient with information about patient assistance programs . Discussed plans with patient for ongoing care management follow up and provided patient with direct contact information for care management team . Advised patient to expect a call from embedded PharmD Lottie Dawson to assist with patient assistance application . Collaborated with RN Case Manager and PharmD re: patient recent enrollment  Patient Self Care Activities:  . Self administers medications as prescribed . Performs ADL's independently . Calls provider office for new concerns or questions  Initial goal documentation         Follow Up Plan: No planned SW follow up at this time. The patient is encouraged to contact CCM SW for future resource needs. The patient will be engaged by [facility] RN Case Manager Barb Merino and PharmD Lottie Dawson to assist with disease management needs.       Daneen Schick, BSW, CDP Social Worker, Certified Dementia Practitioner [place] [facility] [place] [facility] [phone]  Total time spent performing care coordination and/or care management activities with the patient by phone or face to face = 14 minutes.

## 2018-09-14 NOTE — Chronic Care Management (AMB) (Signed)
  Chronic Care Management   Initial Visit Note  09/14/2018 Name: Teresa Stanley MRN: [id number] DOB: [birth year]  Referred by: [name], MD Reason for referral : Chronic Care Management (CCM RNCM Case Collaboration )   TAMI BLASS is a 72 y.o. year old female who is a primary care patient of [name], MD. The care management team was consulted for assistance with chronic disease management and care coordination needs.   Review of patient status, including review of consultants reports, relevant laboratory and other test results, and collaboration with appropriate care team members and the patient's provider was performed as part of comprehensive patient evaluation and provision of chronic care management services.    I initiated and established the plan of care for Teresa Stanley during one on one collaboration with my clinical care management colleague Daneen Schick BSW who is also engaged with this patient to address social work needs.   Goals Addressed    . Assist with Chronic Care Management and Care Coordination needs       Current Barriers:  [name] Knowledge Barriers related to resources and support available to address needs related to Chronic Care Management and cost assistance for Ozempic  Case Manager Clinical Goal(s):  [name] Over the next 30 days, patient will work with the CCM team to address needs related to Chronic disease management and medication management including financial assistance for cost of Ozempic  Interventions:  . Collaborated with BSW and initiated plan of care to address needs related to Chronic Care Management and cost assistance for Ozempic  Patient Self Care Activities:  . Self administers medications as prescribed . Attends all scheduled provider appointments . Calls pharmacy for medication refills . Calls provider office for new concerns or questions  Initial goal documentation         Telephone follow up appointment with care management team  member scheduled for: 10/01/18  Barb Merino, RN, BSN, CCM Care Management Coordinator [place] [facility]  Direct Phone: [phone]

## 2018-09-19 ENCOUNTER — Other Ambulatory Visit: Payer: Self-pay | Admitting: Internal Medicine

## 2018-09-24 ENCOUNTER — Ambulatory Visit: Payer: Self-pay | Admitting: Pharmacist

## 2018-09-24 NOTE — Progress Notes (Signed)
  Chronic Care Management   Outreach Note  09/24/2018 Name: Teresa Stanley MRN: [id number] DOB: 17-Apr-1945  Referred by: [name], MD Reason for referral : Chronic Care Management   An unsuccessful telephone outreach was attempted today. The patient was referred to the case management team by for assistance with chronic care management and care coordination.   Follow Up Plan: A HIPPA compliant phone message was left for the patient providing contact information and requesting a return call.  The care management team will reach out to the patient again over the next 3-5 business days.   Regina Eck, PharmD, BCPS Clinical Pharmacist, [facility] [place]: [phone]

## 2018-09-25 ENCOUNTER — Other Ambulatory Visit: Payer: Self-pay | Admitting: Internal Medicine

## 2018-09-27 ENCOUNTER — Telehealth: Payer: Self-pay

## 2018-09-27 ENCOUNTER — Other Ambulatory Visit: Payer: Self-pay

## 2018-09-27 MED ORDER — METHIMAZOLE 5 MG PO TABS: 5.0000 mg | ORAL_TABLET | Freq: Every day | ORAL | 1 refills | Status: DC

## 2018-09-28 ENCOUNTER — Ambulatory Visit: Payer: Self-pay | Admitting: Pharmacist

## 2018-09-28 DIAGNOSIS — I129 Hypertensive chronic kidney disease with stage 1 through stage 4 chronic kidney disease, or unspecified chronic kidney disease: Secondary | ICD-10-CM

## 2018-09-28 DIAGNOSIS — N183 Chronic kidney disease, stage 3 unspecified: Secondary | ICD-10-CM

## 2018-09-28 DIAGNOSIS — E1122 Type 2 diabetes mellitus with diabetic chronic kidney disease: Secondary | ICD-10-CM

## 2018-09-29 ENCOUNTER — Ambulatory Visit (INDEPENDENT_AMBULATORY_CARE_PROVIDER_SITE_OTHER): Payer: Medicare Other | Admitting: Pharmacist

## 2018-09-29 ENCOUNTER — Other Ambulatory Visit: Payer: Self-pay | Admitting: Pharmacy Technician

## 2018-09-29 DIAGNOSIS — N183 Chronic kidney disease, stage 3 unspecified: Secondary | ICD-10-CM

## 2018-09-29 DIAGNOSIS — E1122 Type 2 diabetes mellitus with diabetic chronic kidney disease: Secondary | ICD-10-CM | POA: Diagnosis not present

## 2018-09-29 DIAGNOSIS — I129 Hypertensive chronic kidney disease with stage 1 through stage 4 chronic kidney disease, or unspecified chronic kidney disease: Secondary | ICD-10-CM

## 2018-09-29 NOTE — Patient Outreach (Signed)
[facility]) Care Management  09/29/2018  Teresa Stanley Jul 08, 1945 [id number]                          Medication Assistance Referral  Referral From: [facility] RPh)  Medication/Company: [name] / Nov[employer]isk Patient application portion:  N/A received from [employer] application portion:  N/A received from [employer]  Follow up:  Submitted completed application and required documents to [employer]. Will follow up with company in 3-5 business days.  Maud Deed Chana Bode [place] Certified Pharmacy Technician [place] Management Direct Dial:[phone]

## 2018-09-30 NOTE — Progress Notes (Signed)
Chronic Care Management   Initial Visit Note  09/28/2018 Name: Teresa Stanley MRN: [id number] DOB: 07/04/1945  Referred by: [name], MD Reason for referral : Chronic Care Management   Teresa Stanley is a 73 y.o. year old female who is a primary care patient of [name], MD. The CCM team was consulted for assistance with chronic disease management and care coordination needs.   Review of patient status, including review of consultants reports, relevant laboratory and other test results, and collaboration with appropriate care team members and the patient's provider was performed as part of comprehensive patient evaluation and provision of chronic care management services.    I spoke with Teresa Stanley by telephone today  Medications: Outpatient Encounter Medications as of 09/28/2018  Medication Sig  . amLODipine (NORVASC) 10 MG tablet TAKE 1 TABLET BY MOUTH EVERY DAY IN THE EVENING  . aspirin 81 MG tablet Take 81 mg by mouth daily.  [name] atorvastatin (LIPITOR) 20 MG tablet TAKE 1 TABLET BY ORAL ROUTE EVERY DAY  . Blood Glucose Monitoring Suppl (ONE TOUCH ULTRA 2) w/Device KIT Use as directed to check blood sugars 2 times per day dx:e11.22  . carboxymethylcellulose (REFRESH PLUS) 0.5 % SOLN Place 1 drop into both eyes daily.  [name] glucose blood (ONE TOUCH ULTRA TEST) test strip Use as instructed to check blood sugars 2 times per day dx: e11.22  . meclizine (ANTIVERT) 25 MG tablet TAKE 1 TABLET (25 MG TOTAL) BY MOUTH 3 (THREE) TIMES DAILY AS NEEDED FOR DIZZINESS. (Patient not taking: Reported on 09/30/2018)  . metFORMIN (GLUCOPHAGE) 1000 MG tablet TAKE 1 TABLET BY MOUTH EVERY DAY  . methimazole (TAPAZOLE) 5 MG tablet Take 1 tablet (5 mg total) by mouth daily.  [name] omeprazole (PRILOSEC) 20 MG capsule TAKE ONE CAPSULE BY MOUTH BEFORE A MEAL  . OneTouch Delica Lancets 24Q MISC Use as directed to check blood sugars 2 times per day dx: e11.22  . propranolol ER (INDERAL LA) 120 MG 24 hr capsule  TAKE 1 CAPSULE BY MOUTH EVERY DAY  . Semaglutide,0.25 or 0.5MG/DOS, (OZEMPIC, 0.25 OR 0.5 MG/DOSE,) 2 MG/1.5ML SOPN Inject 0.5 mg into the skin once a week.  . triamcinolone cream (KENALOG) 0.1 % 1 application daily as needed.  . triamterene-hydrochlorothiazide (MAXZIDE-25) 37.5-25 MG tablet TAKE 1 TABLET BY MOUTH EVERY DAY IN THE MORNING   No facility-administered encounter medications on file as of 09/28/2018.      Objective:   Goals Addressed            This Visit's Progress     Patient Stated   . I need financial assistance for my medications (pt-stated)       Current Barriers:  . Financial Barriers: patient has [employer] and reports copay for Ozempic (>$500/month) is cost prohibitive at this time  Pharmacist Clinical Goal(s):  [name] Over the next 30 days, patient will work with PharmD and providers to relieve medication access concerns  Interventions: . Comprehensive medication review completed; medication list updated in electronic medical record.  [name] by [employer]: Patient meets income/NO out of pocket spend criteria for this medication's patient assistance program. Reviewed application process. Patient will provide proof of income, out of pocket spend report, and will sign application. Will collaborate with primary care provider, Dr. [name] for their portion of application. Once completed, will submit to [employer] patient assistance program. . Face to face clinic PharmD visit set for 09/29/18   Patient Self Care Activities:  .  Patient will provide necessary portions of application   Initial goal documentation          Plan:   Face to Face appointment with care management team member scheduled for: 09/29/18  Regina Eck, PharmD, BCPS Clinical Pharmacist, [facility] [place]: [phone]

## 2018-09-30 NOTE — Patient Instructions (Signed)
Visit Information  Goals Addressed            This Visit's Progress     Patient Stated   . I need financial assistance for my medications (pt-stated)       Current Barriers:  . Financial Barriers: patient has [employer] and reports copay for Ozempic (>$500/month) is cost prohibitive at this time  Pharmacist Clinical Goal(s):  [name] Over the next 30 days, patient will work with PharmD and providers to relieve medication access concerns  Interventions: . Comprehensive medication review completed; medication list updated in electronic medical record.  [name] by [employer]: Patient meets income/NO out of pocket spend criteria for this medication's patient assistance program. Reviewed application process. Patient will provide proof of income, out of pocket spend report, and will sign application. Will collaborate with primary care provider, Dr. [name] for their portion of application. Once completed, will submit to [employer] patient assistance program. . Face to face clinic PharmD visit set for 09/29/18   Patient Self Care Activities:  . Patient will provide necessary portions of application   Initial goal documentation         The patient verbalized understanding of instructions provided today and declined a print copy of patient instruction materials.   Face to Face appointment with care management team member scheduled for: 09/29/18  Regina Eck, PharmD, BCPS Clinical Pharmacist, [facility] [place]: [phone]

## 2018-10-01 ENCOUNTER — Telehealth: Payer: Self-pay

## 2018-10-05 NOTE — Patient Instructions (Signed)
Visit Information  Goals Addressed            This Visit's Progress     Patient Stated   . I need financial assistance for my medications (pt-stated)       Current Barriers:  . Financial Barriers: patient has [employer] and reports copay for Ozempic (>$500/month) is cost prohibitive at this time  Pharmacist Clinical Goal(s):  [name] Over the next 30 days, patient will work with PharmD and providers to relieve medication access concerns  Interventions: . Comprehensive medication review completed; medication list updated in electronic medical record.  [name] by [employer]: Patient meets income/NO out of pocket spend criteria for this medication's patient assistance program. Reviewed application process. Patient provided proof of income, out of pocket spend report, and will sign application. Will collaborate with primary care provider, Dr. [name] for their portion of application. Once completed, will submit to [employer] patient assistance program. . Patient completed application in office.  Application to be faxed to company for approval. . Achieving this goal will allow patient to continue to control her diabetes.  Patient Self Care Activities:  . Patient will provide necessary portions of application   Please see past updates related to this goal by clicking on the "Past Updates" button in the selected goal       . I would like to optimize medication management of my chronic conditions. (pt-stated)       Current Barriers:  . Diabetes: T2DM; most recent A1c 8.9% on 09/01/2018 (patient motivated to decrease) . Current antihyperglycemic regimen: Ozempic . Denies hypoglycemic symptoms; denies hyperglycemic symptoms . Current exercise: light walking, ADLs . Current blood glucose readings: FBG 160-180 . Cardiovascular risk reduction: o Current hypertensive regimen: amlodipine, triamterene/HCTZ, propranolol o Current hyperlipidemia regimen:  atorvastatin 20mg  DAILY #90DS  filled 08/01/18  Pharmacist Clinical Goal(s):  [name] Over the next 90 days, patient with work with PharmD and primary care provider to address needs related to optimized medication management of chronic conditions (diabetes, hypertension, hyperlipidemia)  Interventions: . Comprehensive medication review performed, medication list updated in electronic medical record.  Reviewed medication fill history via insurance claims data confirming patient appears compliant with having her medications filled on time as prescribed by provider. . Reviewed & discussed the following diabetes-related information with patient: o Continue checking blood sugars as directed o Follow ADA recommended "diabetes-friendly" diet  (reviewed healthy snack/food options) o Discussed GLP-1 injection technique; Patient uses One Touch Ultra glucometer o Reviewed medication purpose/side effects  Patient Self Care Activities:  . Patient will check blood glucose daily , document, and provide at future appointments . Patient will focus on medication adherence by continuing to take medications as prescribed. . Patient will take medications as prescribed . Patient will contact provider with any episodes of hypoglycemia . Patient will report any questions or concerns to provider   Initial goal documentation        The patient verbalized understanding of instructions provided today and declined a print copy of patient instruction materials.   The care management team will reach out to the patient again over the next 4 weeks.  Regina Eck, PharmD, BCPS Clinical Pharmacist, [facility] [place]: [phone]

## 2018-10-05 NOTE — Progress Notes (Signed)
Chronic Care Management   Initial Visit Note  09/29/2018 Name: Teresa Stanley MRN: [id number] DOB: Jan 04, 1946  Referred by: [name], MD Reason for referral : Chronic Care Management   Teresa Stanley is a 73 y.o. year old female who is a primary care patient of [name], MD. The CCM team was consulted for assistance with chronic disease management and care coordination needs.   Review of patient status, including review of consultants reports, relevant laboratory and other test results, and collaboration with appropriate care team members and the patient's provider was performed as part of comprehensive patient evaluation and provision of chronic care management services.    I met with Teresa Stanley in clinic today.  Medications: Outpatient Encounter Medications as of 09/29/2018  Medication Sig  . amLODipine (NORVASC) 10 MG tablet TAKE 1 TABLET BY MOUTH EVERY DAY IN THE EVENING  . aspirin 81 MG tablet Take 81 mg by mouth daily.  [name] atorvastatin (LIPITOR) 20 MG tablet TAKE 1 TABLET BY ORAL ROUTE EVERY DAY  . Blood Glucose Monitoring Suppl (ONE TOUCH ULTRA 2) w/Device KIT Use as directed to check blood sugars 2 times per day dx:e11.22  . carboxymethylcellulose (REFRESH PLUS) 0.5 % SOLN Place 1 drop into both eyes daily.  [name] glucose blood (ONE TOUCH ULTRA TEST) test strip Use as instructed to check blood sugars 2 times per day dx: e11.22  . meclizine (ANTIVERT) 25 MG tablet TAKE 1 TABLET (25 MG TOTAL) BY MOUTH 3 (THREE) TIMES DAILY AS NEEDED FOR DIZZINESS. (Patient not taking: Reported on 09/30/2018)  . metFORMIN (GLUCOPHAGE) 1000 MG tablet TAKE 1 TABLET BY MOUTH EVERY DAY  . methimazole (TAPAZOLE) 5 MG tablet Take 1 tablet (5 mg total) by mouth daily.  [name] omeprazole (PRILOSEC) 20 MG capsule TAKE ONE CAPSULE BY MOUTH BEFORE A MEAL  . OneTouch Delica Lancets 41Y MISC Use as directed to check blood sugars 2 times per day dx: e11.22  . propranolol ER (INDERAL LA) 120 MG 24 hr capsule TAKE 1  CAPSULE BY MOUTH EVERY DAY  . Semaglutide,0.25 or 0.5MG/DOS, (OZEMPIC, 0.25 OR 0.5 MG/DOSE,) 2 MG/1.5ML SOPN Inject 0.5 mg into the skin once a week.  . triamcinolone cream (KENALOG) 0.1 % 1 application daily as needed.  . triamterene-hydrochlorothiazide (MAXZIDE-25) 37.5-25 MG tablet TAKE 1 TABLET BY MOUTH EVERY DAY IN THE MORNING   No facility-administered encounter medications on file as of 09/29/2018.      Objective:   Goals Addressed            This Visit's Progress     Patient Stated   . I need financial assistance for my medications (pt-stated)       Current Barriers:  . Financial Barriers: patient has [employer] and reports copay for Ozempic (>$500/month) is cost prohibitive at this time  Pharmacist Clinical Goal(s):  [name] Over the next 30 days, patient will work with PharmD and providers to relieve medication access concerns  Interventions: . Comprehensive medication review completed; medication list updated in electronic medical record.  [name] by [employer]: Patient meets income/NO out of pocket spend criteria for this medication's patient assistance program. Reviewed application process. Patient provided proof of income, out of pocket spend report, and will sign application. Will collaborate with primary care provider, Dr. [name] for their portion of application. Once completed, will submit to [employer] patient assistance program. . Patient completed application in office.  Application to be faxed to company for approval. . Achieving this goal  will allow patient to continue to control her diabetes.  Patient Self Care Activities:  . Patient will provide necessary portions of application   Please see past updates related to this goal by clicking on the "Past Updates" button in the selected goal       . I would like to optimize medication management of my chronic conditions. (pt-stated)       Current Barriers:  . Diabetes: T2DM; most recent A1c 8.9% on  09/01/2018 (patient motivated to decrease) . Current antihyperglycemic regimen: Ozempic . Denies hypoglycemic symptoms; denies hyperglycemic symptoms . Current exercise: light walking, ADLs . Current blood glucose readings: FBG 160-180 . Cardiovascular risk reduction: o Current hypertensive regimen: amlodipine, triamterene/HCTZ, propranolol o Current hyperlipidemia regimen:  atorvastatin 74m DAILY #90DS filled 08/01/18  Pharmacist Clinical Goal(s):  .Marland KitchenOver the next 90 days, patient with work with PharmD and primary care provider to address needs related to optimized medication management of chronic conditions (diabetes, hypertension, hyperlipidemia)  Interventions: . Comprehensive medication review performed, medication list updated in electronic medical record.  Reviewed medication fill history via insurance claims data confirming patient appears compliant with having her medications filled on time as prescribed by provider. . Reviewed & discussed the following diabetes-related information with patient: o Continue checking blood sugars as directed o Follow ADA recommended "diabetes-friendly" diet  (reviewed healthy snack/food options) o Discussed GLP-1 injection technique; Patient uses One Touch Ultra glucometer o Reviewed medication purpose/side effects  Patient Self Care Activities:  . Patient will check blood glucose daily , document, and provide at future appointments . Patient will focus on medication adherence by continuing to take medications as prescribed. . Patient will take medications as prescribed . Patient will contact provider with any episodes of hypoglycemia . Patient will report any questions or concerns to provider   Initial goal documentation         Plan:   The care management team will reach out to the patient again over the next 4 weeks.   JRegina Eck PharmD, BCPS Clinical Pharmacist, [facility] [place] [phone]

## 2018-10-07 ENCOUNTER — Other Ambulatory Visit: Payer: Self-pay | Admitting: Pharmacy Technician

## 2018-10-07 NOTE — Patient Outreach (Signed)
[place] [facility]) Care Management  10/07/2018  KLEE BREIDING 14-Nov-1945 [id number]    Follow up call placed to [employer] regarding patient assistance application(s) for Ozempic , Thayer Headings states that patient has been denied due to income. LIS denial letter is required to continue application process.  Follow up:  Will route note to [employer] [facility] [place] to inform  Maud Deed. Chana Bode [place] Certified Pharmacy Technician [place] Management Direct Dial:[phone]

## 2018-10-14 ENCOUNTER — Ambulatory Visit: Payer: Self-pay | Admitting: Pharmacist

## 2018-10-14 DIAGNOSIS — E1122 Type 2 diabetes mellitus with diabetic chronic kidney disease: Secondary | ICD-10-CM

## 2018-10-14 DIAGNOSIS — N183 Chronic kidney disease, stage 3 unspecified: Secondary | ICD-10-CM

## 2018-10-14 NOTE — Progress Notes (Signed)
Chronic Care Management   Visit Note  10/14/2018 Name: Teresa Stanley MRN: [id number] DOB: 06/10/1945  Referred by: [name], MD Reason for referral : Chronic Care Management   Teresa Stanley is a 73 y.o. year old female who is a primary care patient of [name], MD. The CCM team was consulted for assistance with chronic disease management and care coordination needs.   Review of patient status, including review of consultants reports, relevant laboratory and other test results, and collaboration with appropriate care team members and the patient's provider was performed as part of comprehensive patient evaluation and provision of chronic care management services.    I spoke with Teresa Stanley by telephone today.  Medications: Outpatient Encounter Medications as of 10/14/2018  Medication Sig  . amLODipine (NORVASC) 10 MG tablet TAKE 1 TABLET BY MOUTH EVERY DAY IN THE EVENING  . aspirin 81 MG tablet Take 81 mg by mouth daily.  [name] atorvastatin (LIPITOR) 20 MG tablet TAKE 1 TABLET BY ORAL ROUTE EVERY DAY  . Blood Glucose Monitoring Suppl (ONE TOUCH ULTRA 2) w/Device KIT Use as directed to check blood sugars 2 times per day dx:e11.22  . carboxymethylcellulose (REFRESH PLUS) 0.5 % SOLN Place 1 drop into both eyes daily.  [name] glucose blood (ONE TOUCH ULTRA TEST) test strip Use as instructed to check blood sugars 2 times per day dx: e11.22  . meclizine (ANTIVERT) 25 MG tablet TAKE 1 TABLET (25 MG TOTAL) BY MOUTH 3 (THREE) TIMES DAILY AS NEEDED FOR DIZZINESS. (Patient not taking: Reported on 09/30/2018)  . metFORMIN (GLUCOPHAGE) 1000 MG tablet TAKE 1 TABLET BY MOUTH EVERY DAY  . methimazole (TAPAZOLE) 5 MG tablet Take 1 tablet (5 mg total) by mouth daily.  [name] omeprazole (PRILOSEC) 20 MG capsule TAKE ONE CAPSULE BY MOUTH BEFORE A MEAL  . OneTouch Delica Lancets 74Q MISC Use as directed to check blood sugars 2 times per day dx: e11.22  . propranolol ER (INDERAL LA) 120 MG 24 hr capsule TAKE 1  CAPSULE BY MOUTH EVERY DAY  . Semaglutide,0.25 or 0.5MG/DOS, (OZEMPIC, 0.25 OR 0.5 MG/DOSE,) 2 MG/1.5ML SOPN Inject 0.5 mg into the skin once a week.  . triamcinolone cream (KENALOG) 0.1 % 1 application daily as needed.  . triamterene-hydrochlorothiazide (MAXZIDE-25) 37.5-25 MG tablet TAKE 1 TABLET BY MOUTH EVERY DAY IN THE MORNING   No facility-administered encounter medications on file as of 10/14/2018.      Objective:   Goals Addressed            This Visit's Progress     Patient Stated   . COMPLETED: "I can't afford my Ozempic" (pt-stated)       Current Barriers:  . Financial constraints related to medication copay increases due to being in a coverage gap  Clinical Social Work Clinical Goal(s):  [name] Over the next 45 days, patient will work with embedded PharmD to address needs related to medication assistance  Interventions: . Patient interviewed and appropriate assessments performed - the patient reports her pharmacy has informed her the cost of Ozempic will be over $500 due to being in a coverage gap . Determined the patient does receive some financial assistance from her son to alleviate financial burden of the patients medications . Provided patient with information about patient assistance programs . Discussed plans with patient for ongoing care management follow up and provided patient with direct contact information for care management team . Advised patient to expect a call from embedded PharmD Teresa Stanley to  assist with patient assistance application . Collaborated with RN Case Manager and PharmD re: patient recent enrollment  Patient Self Care Activities:  . Self administers medications as prescribed . Performs ADL's independently . Calls provider office for new concerns or questions  Initial goal documentation     . I need financial assistance for my medications (pt-stated)       Current Barriers:  . Financial Barriers: patient has [employer] and reports  copay for Ozempic (>$500/month) is cost prohibitive at this time  Pharmacist Clinical Goal(s):  [name] Over the next 30 days, patient will work with PharmD and providers to relieve medication access concerns  Interventions: . Comprehensive medication review completed; medication list updated in electronic medical record.  [name] by [employer]: Patient meets income/NO out of pocket spend criteria for this medication's patient assistance program. Reviewed application process. Patient provided proof of income, out of pocket spend report, and will sign application. Will collaborate with primary care provider, Dr. [name] for their portion of application. Once completed, will submit to [employer] patient assistance program. . Patient completed application in office.  Application to be faxed to company for approval.  Company denied application due to income, however patient to apply for LIS/extrahelp through medicare.  Assisted patient with application online per verbal permission.  Patient verbalized understanding.  Will follow up in 3 weeks to confirm approval. . Achieving this goal will allow patient to continue to control her diabetes.  Patient Self Care Activities:  . Patient will provide necessary portions of application   Please see past updates related to this goal by clicking on the "Past Updates" button in the selected goal       . I would like to optimize medication management of my chronic conditions. (pt-stated)       Current Barriers:  . Diabetes: T2DM; most recent A1c 8.9% on 09/01/2018 (patient motivated to decrease) . Current antihyperglycemic regimen: Ozempic . Denies hypoglycemic symptoms; denies hyperglycemic symptoms . Current exercise: light walking, ADLs . Current blood glucose readings: FBG 160-180 . Cardiovascular risk reduction: o Current hypertensive regimen: amlodipine, triamterene/HCTZ, propranolol o Current hyperlipidemia regimen:  atorvastatin 58m DAILY  #90DS filled 08/01/18  Pharmacist Clinical Goal(s):  .Marland KitchenOver the next 90 days, patient with work with PharmD and primary care provider to address needs related to optimized medication management of chronic conditions (diabetes, hypertension, hyperlipidemia)  Interventions: . Comprehensive medication review performed, medication list updated in electronic medical record.  Reviewed medication fill history via insurance claims data confirming patient appears compliant with having her medications filled on time as prescribed by provider. . Reviewed & discussed the following diabetes-related information with patient: o Continue checking blood sugars as directed o Follow ADA recommended "diabetes-friendly" diet  (reviewed healthy snack/food options) o Discussed GLP-1 injection technique; Patient uses One Touch Ultra glucometer o Reviewed medication purpose/side effects  Patient Self Care Activities:  . Patient will check blood glucose daily , document, and provide at future appointments . Patient will focus on medication adherence by continuing to take medications as prescribed. . Patient will take medications as prescribed . Patient will contact provider with any episodes of hypoglycemia . Patient will report any questions or concerns to provider   Please see past updates related to this goal by clicking on the "Past Updates" button in the selected goal           Plan:   The care management team will reach out to the patient again over the  next 3 weeks.  Regina Eck, PharmD, BCPS Clinical Pharmacist, [facility] [place]: [phone]

## 2018-10-14 NOTE — Patient Instructions (Signed)
Visit Information  Goals Addressed            This Visit's Progress     Patient Stated   . COMPLETED: "I can't afford my Ozempic" (pt-stated)       Current Barriers:  . Financial constraints related to medication copay increases due to being in a coverage gap  Clinical Social Work Clinical Goal(s):  [name] Over the next 45 days, patient will work with embedded PharmD to address needs related to medication assistance  Interventions: . Patient interviewed and appropriate assessments performed - the patient reports her pharmacy has informed her the cost of Ozempic will be over $500 due to being in a coverage gap . Determined the patient does receive some financial assistance from her son to alleviate financial burden of the patients medications . Provided patient with information about patient assistance programs . Discussed plans with patient for ongoing care management follow up and provided patient with direct contact information for care management team . Advised patient to expect a call from embedded PharmD Lottie Dawson to assist with patient assistance application . Collaborated with RN Case Manager and PharmD re: patient recent enrollment  Patient Self Care Activities:  . Self administers medications as prescribed . Performs ADL's independently . Calls provider office for new concerns or questions  Initial goal documentation     . I need financial assistance for my medications (pt-stated)       Current Barriers:  . Financial Barriers: patient has [employer] and reports copay for Ozempic (>$500/month) is cost prohibitive at this time  Pharmacist Clinical Goal(s):  [name] Over the next 30 days, patient will work with PharmD and providers to relieve medication access concerns  Interventions: . Comprehensive medication review completed; medication list updated in electronic medical record.  [name] by [employer]: Patient meets income/NO out of pocket spend criteria for this  medication's patient assistance program. Reviewed application process. Patient provided proof of income, out of pocket spend report, and will sign application. Will collaborate with primary care provider, Dr. [name] for their portion of application. Once completed, will submit to [employer] patient assistance program. . Patient completed application in office.  Application to be faxed to company for approval.  Company denied application due to income, however patient to apply for LIS/extrahelp through medicare.  Assisted patient with application online per verbal permission.  Patient verbalized understanding.  Will follow up in 3 weeks to confirm approval. . Achieving this goal will allow patient to continue to control her diabetes.  Patient Self Care Activities:  . Patient will provide necessary portions of application   Please see past updates related to this goal by clicking on the "Past Updates" button in the selected goal       . I would like to optimize medication management of my chronic conditions. (pt-stated)       Current Barriers:  . Diabetes: T2DM; most recent A1c 8.9% on 09/01/2018 (patient motivated to decrease) . Current antihyperglycemic regimen: Ozempic . Denies hypoglycemic symptoms; denies hyperglycemic symptoms . Current exercise: light walking, ADLs . Current blood glucose readings: FBG 160-180 . Cardiovascular risk reduction: o Current hypertensive regimen: amlodipine, triamterene/HCTZ, propranolol o Current hyperlipidemia regimen:  atorvastatin 20mg  DAILY #90DS filled 08/01/18  Pharmacist Clinical Goal(s):  [name] Over the next 90 days, patient with work with PharmD and primary care provider to address needs related to optimized medication management of chronic conditions (diabetes, hypertension, hyperlipidemia)  Interventions: . Comprehensive medication review performed, medication list  updated in electronic medical record.  Reviewed medication fill history via  insurance claims data confirming patient appears compliant with having her medications filled on time as prescribed by provider. . Reviewed & discussed the following diabetes-related information with patient: o Continue checking blood sugars as directed o Follow ADA recommended "diabetes-friendly" diet  (reviewed healthy snack/food options) o Discussed GLP-1 injection technique; Patient uses One Touch Ultra glucometer o Reviewed medication purpose/side effects  Patient Self Care Activities:  . Patient will check blood glucose daily , document, and provide at future appointments . Patient will focus on medication adherence by continuing to take medications as prescribed. . Patient will take medications as prescribed . Patient will contact provider with any episodes of hypoglycemia . Patient will report any questions or concerns to provider   Please see past updates related to this goal by clicking on the "Past Updates" button in the selected goal          The patient verbalized understanding of instructions provided today and declined a print copy of patient instruction materials.   The care management team will reach out to the patient again over the next 3 weeks.  Regina Eck, PharmD, BCPS Clinical Pharmacist, [facility] [place]: [phone]

## 2018-10-25 ENCOUNTER — Telehealth: Payer: Self-pay

## 2018-11-01 ENCOUNTER — Telehealth: Payer: Self-pay

## 2018-11-05 ENCOUNTER — Other Ambulatory Visit: Payer: Self-pay | Admitting: Pharmacist

## 2018-11-05 NOTE — Patient Outreach (Signed)
[facility] Care Management  11/05/2018  YUMA SPAYDE 1945-08-20 OK:[id number]  Successful outreach call to patient, this pharmacist is assisting with coverage while Almyra Free, PharmD is out of the office.   Received a message from Janalyn Shy, RN Embedded Team Lead, patient needs assistance with  semaglutide (Ozempic).  Patient verified date of birth and address and reports she has pen needles and needs additional Ozempic. She reports she injects her dose on Sundays and has 3 doses left. Patient confirms obtaining samples due to cost. Review of chart indicates patient needs to submit a copy of Social Security Admin Extra Help denial letter as part of [employer] Patient Assistance application---she reports she has a copy of this.   Patient reports she has an appointment for influenza vaccination at [place] office on Tuesday---encouraged patient to take copy of her [place] Extra Help letter to office and advised her this pharmacist will send message to practice to inquire on sample availability.   Plan:  Placed call to Barb Merino, RN case manager to update her.   Will update Almyra Free, PharmD upon her return.   Will send message to Octavio Manns with Dr [facility]' office.   Karrie Meres, PharmD, [place] [phone]

## 2018-11-09 ENCOUNTER — Ambulatory Visit (INDEPENDENT_AMBULATORY_CARE_PROVIDER_SITE_OTHER): Payer: Medicare Other

## 2018-11-09 ENCOUNTER — Other Ambulatory Visit: Payer: Self-pay

## 2018-11-09 ENCOUNTER — Telehealth: Payer: Self-pay

## 2018-11-09 VITALS — BP 142/88 | HR 78 | Temp 98.6°F | Ht 62.2 in | Wt 262.0 lb

## 2018-11-09 DIAGNOSIS — Z23 Encounter for immunization: Secondary | ICD-10-CM

## 2018-11-09 NOTE — Progress Notes (Signed)
Pt presents today for flu shot and Ozempic samples

## 2018-11-10 ENCOUNTER — Ambulatory Visit (INDEPENDENT_AMBULATORY_CARE_PROVIDER_SITE_OTHER): Payer: Medicare Other | Admitting: Pharmacist

## 2018-11-10 DIAGNOSIS — I129 Hypertensive chronic kidney disease with stage 1 through stage 4 chronic kidney disease, or unspecified chronic kidney disease: Secondary | ICD-10-CM

## 2018-11-10 DIAGNOSIS — N183 Chronic kidney disease, stage 3 unspecified: Secondary | ICD-10-CM

## 2018-11-10 DIAGNOSIS — E1122 Type 2 diabetes mellitus with diabetic chronic kidney disease: Secondary | ICD-10-CM

## 2018-11-11 ENCOUNTER — Ambulatory Visit: Payer: Self-pay

## 2018-11-11 ENCOUNTER — Telehealth: Payer: Self-pay

## 2018-11-11 DIAGNOSIS — E1122 Type 2 diabetes mellitus with diabetic chronic kidney disease: Secondary | ICD-10-CM

## 2018-11-11 NOTE — Chronic Care Management (AMB) (Signed)
Chronic Care Management     Social Work Follow Up Note  11/11/2018 Name: Teresa Stanley MRN: [id number] DOB: 02-07-1946  Teresa Stanley is a 73 y.o. year old female who is a primary care patient of [name], MD. The CCM team was consulted for assistance with [employer] .   Review of patient status, including review of consultants reports, other relevant assessments, and collaboration with appropriate care team members and the patient's provider was performed as part of comprehensive patient evaluation and provision of chronic care management services.    I placed an outbound call to the patient in response to communication received from embedded PharmD regarding documents the patient dropped off at the practice. See care plan below.  Outpatient Encounter Medications as of 11/11/2018  Medication Sig  . amLODipine (NORVASC) 10 MG tablet TAKE 1 TABLET BY MOUTH EVERY DAY IN THE EVENING  . aspirin 81 MG tablet Take 81 mg by mouth daily.  [name] atorvastatin (LIPITOR) 20 MG tablet TAKE 1 TABLET BY ORAL ROUTE EVERY DAY  . Blood Glucose Monitoring Suppl (ONE TOUCH ULTRA 2) w/Device KIT Use as directed to check blood sugars 2 times per day dx:e11.22  . carboxymethylcellulose (REFRESH PLUS) 0.5 % SOLN Place 1 drop into both eyes daily.  [name] glucose blood (ONE TOUCH ULTRA TEST) test strip Use as instructed to check blood sugars 2 times per day dx: e11.22  . meclizine (ANTIVERT) 25 MG tablet TAKE 1 TABLET (25 MG TOTAL) BY MOUTH 3 (THREE) TIMES DAILY AS NEEDED FOR DIZZINESS. (Patient not taking: Reported on 09/30/2018)  . metFORMIN (GLUCOPHAGE) 1000 MG tablet TAKE 1 TABLET BY MOUTH EVERY DAY  . methimazole (TAPAZOLE) 5 MG tablet Take 1 tablet (5 mg total) by mouth daily.  [name] omeprazole (PRILOSEC) 20 MG capsule TAKE ONE CAPSULE BY MOUTH BEFORE A MEAL  . OneTouch Delica Lancets 86L MISC Use as directed to check blood sugars 2 times per day dx: e11.22  . propranolol ER (INDERAL LA) 120 MG 24 hr capsule  TAKE 1 CAPSULE BY MOUTH EVERY DAY  . Semaglutide,0.25 or 0.5MG/DOS, (OZEMPIC, 0.25 OR 0.5 MG/DOSE,) 2 MG/1.5ML SOPN Inject 0.5 mg into the skin once a week.  . triamcinolone cream (KENALOG) 0.1 % 1 application daily as needed.  . triamterene-hydrochlorothiazide (MAXZIDE-25) 37.5-25 MG tablet TAKE 1 TABLET BY MOUTH EVERY DAY IN THE MORNING   No facility-administered encounter medications on file as of 11/11/2018.      Goals Addressed            This Visit's Progress   . Assist with care coordination and application submission to receive [place] Medicaid       Current Barriers:  . Lacks understanding of application process  . Limited understanding of Medicaid programs   Clinical Social Work Clinical Goal(s):  [name] Over the next 45 days the patient will work with SW to complete Medicaid application process  CCM SW Interventions: . Collaboration with embedded PharmD who reports patient provided DSS documents to her providers office . Reviewed forms to determine the patient turned in a partial Medicaid application . Outbound call to the patient who reports applying for LIS to assist with medication costs. The patient later received documents in the mail from [place] requesting completion . Educated the patient on Medicaid programs and determined based on the patients income level she may qualify for [place] Medicaid . Advised the patient that [place] Medicaid is designed to assist with the cost of Medicare premiums . Obtained verbal  permission from the patient to submit documents to [place] on behalf of the patient . Outbound call to [place] to obtain web address information on [employer] . Successful submission of DSS Medicaid document via online portal . Scheduled follow up call to the patient over the next two weeks to assess progression of patient goal  Patient Self Care Activities:  . Attends all scheduled provider appointments . Performs ADL's independently . Calls  provider office for new concerns or questions . Unable to independently afford prescribed medication  Initial goal documentation         Follow Up Plan: SW will follow up with patient by phone over the next two weeks   Daneen Schick, BSW, CDP Social Worker, Certified Dementia Practitioner [place] [facility] [place] [facility] [phone]  Total time spent performing care coordination and/or care management activities with the patient by phone or face to face = 15 minutes.

## 2018-11-11 NOTE — Patient Instructions (Signed)
Social Worker Visit Information  Goals we discussed today:  Goals Addressed            This Visit's Progress   . Assist with care coordination and application submission to receive [place] Medicaid       Current Barriers:  . Lacks understanding of application process  . Limited understanding of Medicaid programs   Clinical Social Work Clinical Goal(s):  [name] Over the next 45 days the patient will work with SW to complete Medicaid application process  CCM SW Interventions: . Collaboration with embedded PharmD who reports patient provided DSS documents to her providers office . Reviewed forms to determine the patient turned in a partial Medicaid application . Outbound call to the patient who reports applying for LIS to assist with medication costs. The patient later received documents in the mail from [place] requesting completion . Educated the patient on Medicaid programs and determined based on the patients income level she may qualify for [place] Medicaid . Advised the patient that [place] Medicaid is designed to assist with the cost of Medicare premiums . Obtained verbal permission from the patient to submit documents to [place] on behalf of the patient . Outbound call to [place] to obtain web address information on [employer] . Successful submission of DSS Medicaid document via online portal . Scheduled follow up call to the patient over the next two weeks to assess progression of patient goal  Patient Self Care Activities:  . Attends all scheduled provider appointments . Performs ADL's independently . Calls provider office for new concerns or questions . Unable to independently afford prescribed medication  Initial goal documentation         Materials Provided: Verbal education about Medicaid provided by phone  Follow Up Plan: SW will follow up with patient by phone over the next two weeks   Daneen Schick, BSW, CDP Social Worker, Certified Dementia  Practitioner [place] [facility] [place] [facility] [phone]

## 2018-11-13 NOTE — Patient Instructions (Signed)
Visit Information  Goals Addressed            This Visit's Progress     Patient Stated   . I need financial assistance for my medications (pt-stated)       Current Barriers:  . Financial Barriers: patient has [employer] and reports copay for Ozempic (>$500/month) is cost prohibitive at this time  Pharmacist Clinical Goal(s):  [name] Over the next 30 days, patient will work with PharmD and providers to relieve medication access concerns  Interventions: . Comprehensive medication review completed; medication list updated in electronic medical record.  [name] by [employer]: Patient meets income/NO out of pocket spend criteria for this medication's patient assistance program. Reviewed application process. Patient provided proof of income, out of pocket spend report, and will sign application. Will collaborate with primary care provider, Dr. [name] for their portion of application. Once completed, will submit to [employer] patient assistance program. . Patient completed application in office.  Application faxed to company for approval.  Company denied application due to income, however patient to apply for LIS/extrahelp through medicare.  Assisted patient with application online per verbal permission.  Patient verbalized understanding.  Will follow up in 3 weeks to confirm approval. . Consulted with [place] to assist patient in applying for medicaid online. . Will call pharmacy to see if LIS/extra help has been approved. . Achieving this goal will allow patient to continue to control her diabetes.  Patient Self Care Activities:  . Patient will provide necessary portions of application   Please see past updates related to this goal by clicking on the "Past Updates" button in the selected goal       . I would like to optimize medication management of my chronic conditions. (pt-stated)       Current Barriers:  . Diabetes: T2DM; most recent A1c 8.9% on 09/01/2018  (patient motivated to decrease) . Current antihyperglycemic regimen: Ozempic . Denies hypoglycemic symptoms; denies hyperglycemic symptoms . Current exercise: light walking, ADLs . Current blood glucose readings: FBG 160-180 . Cardiovascular risk reduction: o Current hypertensive regimen: amlodipine, triamterene/HCTZ, propranolol o Current hyperlipidemia regimen:  atorvastatin 20mg  DAILY #90DS filled 08/01/18 (11/09/18-->patient out of refills, called in refills for all chronic medications)  Pharmacist Clinical Goal(s):  [name] Over the next 90 days, patient with work with PharmD and primary care provider to address needs related to optimized medication management of chronic conditions (diabetes, hypertension, hyperlipidemia)  Interventions: . Comprehensive medication review performed, medication list updated in electronic medical record.  Reviewed medication fill history via insurance claims data confirming patient appears compliant with having her medications filled on time as prescribed by provider. . Reviewed & discussed the following diabetes-related information with patient: o Continue checking blood sugars as directed o Follow ADA recommended "diabetes-friendly" diet  (reviewed healthy snack/food options) o Discussed GLP-1 injection technique; Patient uses One Touch Ultra glucometer o Reviewed medication purpose/side effects  Patient Self Care Activities:  . Patient will check blood glucose daily , document, and provide at future appointments . Patient will focus on medication adherence by continuing to take medications as prescribed. . Patient will take medications as prescribed . Patient will contact provider with any episodes of hypoglycemia . Patient will report any questions or concerns to provider   Please see past updates related to this goal by clicking on the "Past Updates" button in the selected goal          The patient verbalized understanding of  instructions provided  today and declined a print copy of patient instruction materials.   The care management team will reach out to the patient again over the next 7 business  days.   Regina Eck, PharmD, BCPS Clinical Pharmacist, [facility] [place]: [phone]

## 2018-11-13 NOTE — Progress Notes (Signed)
Chronic Care Management    Visit Note  11/10/2018 Name: Teresa Stanley MRN: [id number] DOB: [birth year]  Referred by: [name], MD Reason for referral : Chronic Care Management   Teresa Stanley is a 73 y.o. year old female who is a primary care patient of [name], MD. The CCM team was consulted for assistance with chronic disease management and care coordination needs.   Review of patient status, including review of consultants reports, relevant laboratory and other test results, and collaboration with appropriate care team members and the patient's provider was performed as part of comprehensive patient evaluation and provision of chronic care management services.    I spoke with Teresa Stanley by telephone today  Advanced Directives Status: [place] and [employer] application for related entries.   Medications: Outpatient Encounter Medications as of 11/10/2018  Medication Sig  . amLODipine (NORVASC) 10 MG tablet TAKE 1 TABLET BY MOUTH EVERY DAY IN THE EVENING  . aspirin 81 MG tablet Take 81 mg by mouth daily.  [name] atorvastatin (LIPITOR) 20 MG tablet TAKE 1 TABLET BY ORAL ROUTE EVERY DAY  . Blood Glucose Monitoring Suppl (ONE TOUCH ULTRA 2) w/Device KIT Use as directed to check blood sugars 2 times per day dx:e11.22  . carboxymethylcellulose (REFRESH PLUS) 0.5 % SOLN Place 1 drop into both eyes daily.  [name] glucose blood (ONE TOUCH ULTRA TEST) test strip Use as instructed to check blood sugars 2 times per day dx: e11.22  . meclizine (ANTIVERT) 25 MG tablet TAKE 1 TABLET (25 MG TOTAL) BY MOUTH 3 (THREE) TIMES DAILY AS NEEDED FOR DIZZINESS. (Patient not taking: Reported on 09/30/2018)  . metFORMIN (GLUCOPHAGE) 1000 MG tablet TAKE 1 TABLET BY MOUTH EVERY DAY  . methimazole (TAPAZOLE) 5 MG tablet Take 1 tablet (5 mg total) by mouth daily.  [name] omeprazole (PRILOSEC) 20 MG capsule TAKE ONE CAPSULE BY MOUTH BEFORE A MEAL  . OneTouch Delica Lancets 37T MISC Use as directed to check blood  sugars 2 times per day dx: e11.22  . propranolol ER (INDERAL LA) 120 MG 24 hr capsule TAKE 1 CAPSULE BY MOUTH EVERY DAY  . Semaglutide,0.25 or 0.5MG/DOS, (OZEMPIC, 0.25 OR 0.5 MG/DOSE,) 2 MG/1.5ML SOPN Inject 0.5 mg into the skin once a week.  . triamcinolone cream (KENALOG) 0.1 % 1 application daily as needed.  . triamterene-hydrochlorothiazide (MAXZIDE-25) 37.5-25 MG tablet TAKE 1 TABLET BY MOUTH EVERY DAY IN THE MORNING   No facility-administered encounter medications on file as of 11/10/2018.      Objective:   Goals Addressed            This Visit's Progress     Patient Stated   . I need financial assistance for my medications (pt-stated)       Current Barriers:  . Financial Barriers: patient has [employer] and reports copay for Ozempic (>$500/month) is cost prohibitive at this time  Pharmacist Clinical Goal(s):  [name] Over the next 30 days, patient will work with PharmD and providers to relieve medication access concerns  Interventions: . Comprehensive medication review completed; medication list updated in electronic medical record.  [name] by [employer]: Patient meets income/NO out of pocket spend criteria for this medication's patient assistance program. Reviewed application process. Patient provided proof of income, out of pocket spend report, and will sign application. Will collaborate with primary care provider, Dr. [name] for their portion of application. Once completed, will submit to [employer] patient assistance program. . Patient completed application  in office.  Application faxed to company for approval.  Company denied application due to income, however patient to apply for LIS/extrahelp through medicare.  Assisted patient with application online per verbal permission.  Patient verbalized understanding.  Will follow up in 3 weeks to confirm approval. . Consulted with [place] to assist patient in applying for medicaid online. . Will call  pharmacy to see if LIS/extra help has been approved. . Achieving this goal will allow patient to continue to control her diabetes.  Patient Self Care Activities:  . Patient will provide necessary portions of application   Please see past updates related to this goal by clicking on the "Past Updates" button in the selected goal       . I would like to optimize medication management of my chronic conditions. (pt-stated)       Current Barriers:  . Diabetes: T2DM; most recent A1c 8.9% on 09/01/2018 (patient motivated to decrease) . Current antihyperglycemic regimen: Ozempic . Denies hypoglycemic symptoms; denies hyperglycemic symptoms . Current exercise: light walking, ADLs . Current blood glucose readings: FBG 160-180 . Cardiovascular risk reduction: o Current hypertensive regimen: amlodipine, triamterene/HCTZ, propranolol o Current hyperlipidemia regimen:  atorvastatin 29m DAILY #90DS filled 08/01/18 (11/09/18-->patient out of refills, called in refills for all chronic medications)  Pharmacist Clinical Goal(s):  .Marland KitchenOver the next 90 days, patient with work with PharmD and primary care provider to address needs related to optimized medication management of chronic conditions (diabetes, hypertension, hyperlipidemia)  Interventions: . Comprehensive medication review performed, medication list updated in electronic medical record.  Reviewed medication fill history via insurance claims data confirming patient appears compliant with having her medications filled on time as prescribed by provider. . Reviewed & discussed the following diabetes-related information with patient: o Continue checking blood sugars as directed o Follow ADA recommended "diabetes-friendly" diet  (reviewed healthy snack/food options) o Discussed GLP-1 injection technique; Patient uses One Touch Ultra glucometer o Reviewed medication purpose/side effects  Patient Self Care Activities:  . Patient will check blood glucose  daily , document, and provide at future appointments . Patient will focus on medication adherence by continuing to take medications as prescribed. . Patient will take medications as prescribed . Patient will contact provider with any episodes of hypoglycemia . Patient will report any questions or concerns to provider   Please see past updates related to this goal by clicking on the "Past Updates" button in the selected goal          Plan:   The care management team will reach out to the patient again over the next 7 business days.   JRegina Eck PharmD, BCPS Clinical Pharmacist, [facility] [place] [phone]

## 2018-11-16 ENCOUNTER — Telehealth: Payer: Self-pay

## 2018-11-17 ENCOUNTER — Ambulatory Visit (INDEPENDENT_AMBULATORY_CARE_PROVIDER_SITE_OTHER): Payer: Medicare Other | Admitting: Pharmacist

## 2018-11-17 DIAGNOSIS — N183 Chronic kidney disease, stage 3 unspecified: Secondary | ICD-10-CM

## 2018-11-17 DIAGNOSIS — E1122 Type 2 diabetes mellitus with diabetic chronic kidney disease: Secondary | ICD-10-CM | POA: Diagnosis not present

## 2018-11-22 ENCOUNTER — Other Ambulatory Visit: Payer: Self-pay

## 2018-11-22 MED ORDER — ATORVASTATIN CALCIUM 20 MG PO TABS: ORAL_TABLET | ORAL | 2 refills | Status: DC

## 2018-11-22 MED ORDER — TRIAMTERENE-HCTZ 37.5-25 MG PO TABS: ORAL_TABLET | ORAL | 1 refills | Status: DC

## 2018-11-22 MED ORDER — PROPRANOLOL HCL ER 120 MG PO CP24: ORAL_CAPSULE | ORAL | 0 refills | Status: DC

## 2018-11-22 MED ORDER — AMLODIPINE BESYLATE 10 MG PO TABS: ORAL_TABLET | ORAL | 2 refills | Status: DC

## 2018-11-22 MED ORDER — METFORMIN HCL 1000 MG PO TABS: 1000.0000 mg | ORAL_TABLET | Freq: Every day | ORAL | 0 refills | Status: DC

## 2018-11-22 NOTE — Patient Instructions (Signed)
Visit Information  Goals Addressed            This Visit's Progress     Patient Stated   . I need financial assistance for my medications (pt-stated)       Current Barriers:  . Financial Barriers: patient has [employer] and reports copay for Ozempic (>$500/month) is cost prohibitive at this time  Pharmacist Clinical Goal(s):  [name] Over the next 30 days, patient will work with PharmD and providers to relieve medication access concerns  Interventions: . Comprehensive medication review completed; medication list updated in electronic medical record.  [name] by [employer]: Patient meets income/NO out of pocket spend criteria for this medication's patient assistance program. Reviewed application process. Patient provided proof of income, out of pocket spend report, and will sign application. Will collaborate with primary care provider, Dr. [name] for their portion of application. Once completed, will submit to [employer] patient assistance program. . Patient completed application in office.  Application faxed to company for approval.  Company denied application due to income, however patient to apply for LIS/extrahelp through medicare.  Assisted patient with application online per verbal permission.  Patient verbalized understanding.  [name] Consulted with [place] to assist patient in applying for [employer].  Application submitted week of 11/15/2018 . Will call pharmacy to see if LIS/extra help has been approved--no approval yet.  Will continue to follow. . Achieving this goal will allow patient to continue to control her diabetes.  Patient Self Care Activities:  . Patient will provide necessary portions of application   Please see past updates related to this goal by clicking on the "Past Updates" button in the selected goal       . I would like to optimize medication management of my chronic conditions. (pt-stated)       Current Barriers:  . Diabetes: T2DM;  most recent A1c 8.9% on 09/01/2018 (patient motivated to decrease-recheck on 12/14/2018) . Current antihyperglycemic regimen: Ozempic 0.5mg  weekly, metformin . Denies hypoglycemic symptoms; denies hyperglycemic symptoms . Current exercise: light walking, ADLs . Current blood glucose readings: FBG 160-180 . Cardiovascular risk reduction: o Current hypertensive regimen: amlodipine, triamterene/HCTZ, propranolol (refills called in, non-compliant with refills) o Current hyperlipidemia regimen:  atorvastatin 20mg  DAILY #90DS filled 08/01/18 (11/09/18-->patient out of refills, called in refills for all chronic medications)  Pharmacist Clinical Goal(s):  [name] Over the next 90 days, patient with work with PharmD and primary care provider to address needs related to optimized medication management of chronic conditions (diabetes, hypertension, hyperlipidemia)  Interventions: . Comprehensive medication review performed, medication list updated in electronic medical record.  Reviewed medication fill history via insurance claims data confirming patient appears compliant with having her medications filled on time as prescribed by provider. . Reviewed & discussed the following diabetes-related information with patient: o Continue checking blood sugars as directed o Follow ADA recommended "diabetes-friendly" diet  (reviewed healthy snack/food options) o Discussed GLP-1 injection technique; Patient uses One Touch Ultra glucometer o Reviewed medication purpose/side effects  Patient Self Care Activities:  . Patient will check blood glucose daily , document, and provide at future appointments . Patient will focus on medication adherence by continuing to take medications as prescribed. . Patient will take medications as prescribed . Patient will contact provider with any episodes of hypoglycemia . Patient will report any questions or concerns to provider   Please see past updates related to this goal by clicking on  the "Past Updates" button in the  selected goal          The patient verbalized understanding of instructions provided today and declined a print copy of patient instruction materials.   The care management team will reach out to the patient again over the next 4 weeks.  Regina Eck, PharmD, BCPS Clinical Pharmacist, [facility] [place]: [phone]

## 2018-11-22 NOTE — Progress Notes (Signed)
Chronic Care Management   Visit Note  11/17/2018 Name: Teresa Stanley MRN: [id number] DOB: May 30, 1945  Referred by: Teresa Chard, MD Reason for referral : Chronic Care Management   Teresa Stanley is a 73 y.o. year old female who is a primary care patient of Teresa Chard, MD. The CCM team was consulted for assistance with chronic disease management and care coordination needs related to HTN DMII  Review of patient status, including review of consultants reports, relevant laboratory and other test results, and collaboration with appropriate care team members and the patient's provider was performed as part of comprehensive patient evaluation and provision of chronic care management services.    I spoke with Teresa Stanley by telephone today.  Advanced Directives Status: N See Care Plan and Vynca application for related entries.   Medications: Outpatient Encounter Medications as of 11/17/2018  Medication Sig  . aspirin 81 MG tablet Take 81 mg by mouth daily.  . Blood Glucose Monitoring Suppl (ONE TOUCH ULTRA 2) w/Device KIT Use as directed to check blood sugars 2 times per day dx:e11.22  . carboxymethylcellulose (REFRESH PLUS) 0.5 % SOLN Place 1 drop into both eyes daily.  [name] glucose blood (ONE TOUCH ULTRA TEST) test strip Use as instructed to check blood sugars 2 times per day dx: e11.22  . meclizine (ANTIVERT) 25 MG tablet TAKE 1 TABLET (25 MG TOTAL) BY MOUTH 3 (THREE) TIMES DAILY AS NEEDED FOR DIZZINESS. (Patient not taking: Reported on 09/30/2018)  . methimazole (TAPAZOLE) 5 MG tablet Take 1 tablet (5 mg total) by mouth daily.  [name] omeprazole (PRILOSEC) 20 MG capsule TAKE ONE CAPSULE BY MOUTH BEFORE A MEAL  . OneTouch Delica Lancets 95A MISC Use as directed to check blood sugars 2 times per day dx: e11.22  . Semaglutide,0.25 or 0.5MG/DOS, (OZEMPIC, 0.25 OR 0.5 MG/DOSE,) 2 MG/1.5ML SOPN Inject 0.5 mg into the skin once a week.  . triamcinolone cream (KENALOG) 0.1 % 1 application daily as  needed.  . [DISCONTINUED] amLODipine (NORVASC) 10 MG tablet TAKE 1 TABLET BY MOUTH EVERY DAY IN THE EVENING  . [DISCONTINUED] atorvastatin (LIPITOR) 20 MG tablet TAKE 1 TABLET BY ORAL ROUTE EVERY DAY  . [DISCONTINUED] metFORMIN (GLUCOPHAGE) 1000 MG tablet TAKE 1 TABLET BY MOUTH EVERY DAY  . [DISCONTINUED] propranolol ER (INDERAL LA) 120 MG 24 hr capsule TAKE 1 CAPSULE BY MOUTH EVERY DAY  . [DISCONTINUED] triamterene-hydrochlorothiazide (OZHYQMV-78) 37.5-25 MG tablet TAKE 1 TABLET BY MOUTH EVERY DAY IN THE MORNING   No facility-administered encounter medications on file as of 11/17/2018.      Objective:   Goals Addressed            This Visit's Progress     Patient Stated   . I need financial assistance for my medications (pt-stated)       Current Barriers:  . Financial Barriers: patient has [employer] and reports copay for Ozempic (>$500/month) is cost prohibitive at this time  Pharmacist Clinical Goal(s):  [name] Over the next 30 days, patient will work with PharmD and providers to relieve medication access concerns  Interventions: . Comprehensive medication review completed; medication list updated in electronic medical record.  Teresa Stanley by [employer]: Patient meets income/NO out of pocket spend criteria for this medication's patient assistance program. Reviewed application process. Patient provided proof of income, out of pocket spend report, and will sign application. Will collaborate with primary care provider, Dr. Glendale Stanley for their portion of application. Once completed, will submit to [employer]  patient assistance program. . Patient completed application in office.  Application faxed to company for approval.  Company denied application due to income, however patient to apply for LIS/extrahelp through medicare.  Assisted patient with application online per verbal permission.  Patient verbalized understanding.  [name] Consulted with Teresa Stanley to assist patient in  applying for [employer].  Application submitted week of 11/15/2018 . Will call pharmacy to see if LIS/extra help has been approved--no approval yet.  Will continue to follow. . Achieving this goal will allow patient to continue to control her diabetes.  Patient Self Care Activities:  . Patient will provide necessary portions of application   Please see past updates related to this goal by clicking on the "Past Updates" button in the selected goal       . I would like to optimize medication management of my chronic conditions. (pt-stated)       Current Barriers:  . Diabetes: T2DM; most recent A1c 8.9% on 09/01/2018 (patient motivated to decrease-recheck on 12/14/2018) . Current antihyperglycemic regimen: Ozempic 0.5m weekly, metformin . Denies hypoglycemic symptoms; denies hyperglycemic symptoms . Current exercise: light walking, ADLs . Current blood glucose readings: FBG 160-180 . Cardiovascular risk reduction: o Current hypertensive regimen: amlodipine, triamterene/HCTZ, propranolol (refills called in, non-compliant with refills) o Current hyperlipidemia regimen:  atorvastatin 212mDAILY #90DS filled 08/01/18 (11/09/18-->patient out of refills, called in refills for all chronic medications)  Pharmacist Clinical Goal(s):  . Marland Kitchenver the next 90 days, patient with work with PharmD and primary care provider to address needs related to optimized medication management of chronic conditions (diabetes, hypertension, hyperlipidemia)  Interventions: . Comprehensive medication review performed, medication list updated in electronic medical record.  Reviewed medication fill history via insurance claims data confirming patient appears compliant with having her medications filled on time as prescribed by provider. . Reviewed & discussed the following diabetes-related information with patient: o Continue checking blood sugars as directed o Follow ADA recommended "diabetes-friendly" diet  (reviewed  healthy snack/food options) o Discussed GLP-1 injection technique; Patient uses One Touch Ultra glucometer o Reviewed medication purpose/side effects  Patient Self Care Activities:  . Patient will check blood glucose daily , document, and provide at future appointments . Patient will focus on medication adherence by continuing to take medications as prescribed. . Patient will take medications as prescribed . Patient will contact provider with any episodes of hypoglycemia . Patient will report any questions or concerns to provider   Please see past updates related to this goal by clicking on the "Past Updates" button in the selected goal           Plan:   The care management team will reach out to the patient again over the next 4 weeks.   JuRegina EckPharmD, BCPS Clinical Pharmacist, [facility] [phone]

## 2018-11-23 ENCOUNTER — Telehealth: Payer: Self-pay

## 2018-11-23 ENCOUNTER — Ambulatory Visit: Payer: Self-pay

## 2018-11-23 DIAGNOSIS — E1122 Type 2 diabetes mellitus with diabetic chronic kidney disease: Secondary | ICD-10-CM

## 2018-11-23 DIAGNOSIS — N183 Chronic kidney disease, stage 3 unspecified: Secondary | ICD-10-CM

## 2018-11-23 NOTE — Chronic Care Management (AMB) (Signed)
  Chronic Care Management   Social Work Note  11/23/2018 Name: Teresa Stanley MRN: [id number] DOB: 04/22/1945  Teresa Stanley is a 73 y.o. year old female who sees [name], MD for primary care. The CCM team was consulted for assistance with chronic case management.   SW placed an unsuccessful outbound call to the patient to assess progression of Medicaid application. SW will follow up with the patient over the next two weeks if no return call is received.  Daneen Schick, BSW, CDP Social Worker, Certified Dementia Practitioner [facility] [employer] [facility] [phone]

## 2018-11-29 ENCOUNTER — Telehealth: Payer: Self-pay

## 2018-11-29 NOTE — Telephone Encounter (Signed)
The pt said that she has a 2 months left of her metformin, and she is using 0.5 of ozempic per week, yes she needs something to read on this, and the pharmacy was called to cancel all metformin refills.

## 2018-11-29 NOTE — Telephone Encounter (Signed)
-----   Message from [name], MD sent at 11/27/2018  4:34 PM EDT ----- Pls contact pt - ask her how much metformin does she have left? When she runs out, she needs to stop. Let her know that we are stopping due to kidney fxn. If she were to get sick/dehydrated, the metformin could make her sick. Pls remind her to do chair exercises daily. Does she need something to read on this?   Has she been taking ozempic every week? Pls confirm dose.   Please also call pharmacy - cancel all metformin refills.

## 2018-12-02 ENCOUNTER — Telehealth: Payer: Self-pay

## 2018-12-08 ENCOUNTER — Ambulatory Visit: Payer: Self-pay

## 2018-12-08 DIAGNOSIS — E1122 Type 2 diabetes mellitus with diabetic chronic kidney disease: Secondary | ICD-10-CM

## 2018-12-08 NOTE — Chronic Care Management (AMB) (Signed)
Chronic Care Management    Social Work Follow Up Note  12/08/2018 Name: Teresa Stanley MRN: [id number] DOB: Jul 19, 1945  Teresa Stanley is a 73 y.o. year old female who is a primary care patient of [name], MD. The CCM team was consulted for assistance with care coordination.  Review of patient status, including review of consultants reports, other relevant assessments, and collaboration with appropriate care team members and the patient's provider was performed as part of comprehensive patient evaluation and provision of chronic care management services.    SW placed an outbound call to the patient to assess outcome of patients Medicaid application. See care plan below for today's interventions.  Outpatient Encounter Medications as of 12/08/2018  Medication Sig  . amLODipine (NORVASC) 10 MG tablet TAKE 1 TABLET BY MOUTH EVERY DAY IN THE EVENING  . aspirin 81 MG tablet Take 81 mg by mouth daily.  [name] atorvastatin (LIPITOR) 20 MG tablet TAKE 1 TABLET BY ORAL ROUTE EVERY DAY  . Blood Glucose Monitoring Suppl (ONE TOUCH ULTRA 2) w/Device KIT Use as directed to check blood sugars 2 times per day dx:e11.22  . carboxymethylcellulose (REFRESH PLUS) 0.5 % SOLN Place 1 drop into both eyes daily.  [name] glucose blood (ONE TOUCH ULTRA TEST) test strip Use as instructed to check blood sugars 2 times per day dx: e11.22  . meclizine (ANTIVERT) 25 MG tablet TAKE 1 TABLET (25 MG TOTAL) BY MOUTH 3 (THREE) TIMES DAILY AS NEEDED FOR DIZZINESS. (Patient not taking: Reported on 09/30/2018)  . metFORMIN (GLUCOPHAGE) 1000 MG tablet Take 1 tablet (1,000 mg total) by mouth daily.  . methimazole (TAPAZOLE) 5 MG tablet Take 1 tablet (5 mg total) by mouth daily.  [name] omeprazole (PRILOSEC) 20 MG capsule TAKE ONE CAPSULE BY MOUTH BEFORE A MEAL  . OneTouch Delica Lancets 09N MISC Use as directed to check blood sugars 2 times per day dx: e11.22  . propranolol ER (INDERAL LA) 120 MG 24 hr capsule TAKE 1 CAPSULE BY MOUTH EVERY  DAY  . Semaglutide,0.25 or 0.5MG/DOS, (OZEMPIC, 0.25 OR 0.5 MG/DOSE,) 2 MG/1.5ML SOPN Inject 0.5 mg into the skin once a week.  . triamcinolone cream (KENALOG) 0.1 % 1 application daily as needed.  . triamterene-hydrochlorothiazide (MAXZIDE-25) 37.5-25 MG tablet TAKE 1 TABLET BY MOUTH EVERY DAY IN THE MORNING   No facility-administered encounter medications on file as of 12/08/2018.      Goals Addressed            This Visit's Progress   . Assist with care coordination and application submission to receive MQ[place] Medicaid   On track    Current Barriers:  . Lacks understanding of application process  . Limited understanding of Medicaid programs   Clinical Social Work Clinical Goal(s):  [name] Over the next 45 days the patient will work with SW to complete Medicaid application process  CCM SW Interventions: Completed 12/08/2018 . Outbound call to the patient to assess progression of patient stated goal . Determined the patient has been in contact with DSS and provided documents detailing monthly rent costs as well as proof of income . Advised the patient it may take up to 45 days to process the application . Scheduled follow up call to the patient over the next month to assess outcome of Medicaid application . Encouraged the patient to contact SW with resource needs should they arise prior to the next scheduled call  Patient Self Care Activities:  . Attends all scheduled provider appointments . Performs  ADL's independently . Calls provider office for new concerns or questions . Unable to independently afford prescribed medication  Please see past updates related to this goal by clicking on the "Past Updates" button in the selected goal          Follow Up Plan: SW will follow up with patient by phone over the next month.   Daneen Schick, BSW, CDP Social Worker, Certified Dementia Practitioner [place] [facility] [phone]  Total time spent performing care coordination  and/or care management activities with the patient by phone or face to face = 8 minutes.

## 2018-12-08 NOTE — Patient Instructions (Signed)
Social Worker Visit Information  Goals we discussed today:  Goals Addressed            This Visit's Progress   . Assist with care coordination and application submission to receive [place] Medicaid   On track    Current Barriers:  . Lacks understanding of application process  . Limited understanding of Medicaid programs   Clinical Social Work Clinical Goal(s):  [name] Over the next 45 days the patient will work with SW to complete Medicaid application process  CCM SW Interventions: Completed 12/08/2018 . Outbound call to the patient to assess progression of patient stated goal . Determined the patient has been in contact with DSS and provided documents detailing monthly rent costs as well as proof of income . Advised the patient it may take up to 45 days to process the application . Scheduled follow up call to the patient over the next month to assess outcome of Medicaid application . Encouraged the patient to contact SW with resource needs should they arise prior to the next scheduled call  Patient Self Care Activities:  . Attends all scheduled provider appointments . Performs ADL's independently . Calls provider office for new concerns or questions . Unable to independently afford prescribed medication  Please see past updates related to this goal by clicking on the "Past Updates" button in the selected goal          Materials Provided: Verbal education about Medicaid application process provided by phone  Follow Up Plan: SW will follow up with patient by phone over the next month.   Daneen Schick, BSW, CDP Social Worker, Certified Dementia Practitioner [place] [facility] [phone]

## 2018-12-13 ENCOUNTER — Ambulatory Visit (INDEPENDENT_AMBULATORY_CARE_PROVIDER_SITE_OTHER): Payer: Medicare Other | Admitting: Pharmacist

## 2018-12-13 DIAGNOSIS — N183 Chronic kidney disease, stage 3 unspecified: Secondary | ICD-10-CM

## 2018-12-13 DIAGNOSIS — E1122 Type 2 diabetes mellitus with diabetic chronic kidney disease: Secondary | ICD-10-CM | POA: Diagnosis not present

## 2018-12-14 ENCOUNTER — Ambulatory Visit (INDEPENDENT_AMBULATORY_CARE_PROVIDER_SITE_OTHER): Payer: Medicare Other | Admitting: Internal Medicine

## 2018-12-14 ENCOUNTER — Other Ambulatory Visit: Payer: Self-pay

## 2018-12-14 ENCOUNTER — Encounter: Payer: Self-pay | Admitting: Internal Medicine

## 2018-12-14 VITALS — BP 126/78 | HR 86 | Temp 98.3°F | Ht 62.2 in | Wt 260.4 lb

## 2018-12-14 DIAGNOSIS — E1122 Type 2 diabetes mellitus with diabetic chronic kidney disease: Secondary | ICD-10-CM | POA: Diagnosis not present

## 2018-12-14 DIAGNOSIS — Z23 Encounter for immunization: Secondary | ICD-10-CM | POA: Diagnosis not present

## 2018-12-14 DIAGNOSIS — N183 Chronic kidney disease, stage 3 unspecified: Secondary | ICD-10-CM

## 2018-12-14 DIAGNOSIS — I129 Hypertensive chronic kidney disease with stage 1 through stage 4 chronic kidney disease, or unspecified chronic kidney disease: Secondary | ICD-10-CM

## 2018-12-14 DIAGNOSIS — Z6841 Body Mass Index (BMI) 40.0 and over, adult: Secondary | ICD-10-CM

## 2018-12-14 DIAGNOSIS — I776 Arteritis, unspecified: Secondary | ICD-10-CM | POA: Insufficient documentation

## 2018-12-14 MED ORDER — PREVNAR 13 IM SUSP: 0.5000 mL | INTRAMUSCULAR | 0 refills | Status: AC

## 2018-12-14 NOTE — Patient Instructions (Signed)
Diabetes Mellitus and Foot Care Foot care is an important part of your health, especially when you have diabetes. Diabetes may cause you to have problems because of poor blood flow (circulation) to your feet and legs, which can cause your skin to:  Become thinner and drier.  Break more easily.  Heal more slowly.  Peel and crack. You may also have nerve damage (neuropathy) in your legs and feet, causing decreased feeling in them. This means that you may not notice minor injuries to your feet that could lead to more serious problems. Noticing and addressing any potential problems early is the best way to prevent future foot problems. How to care for your feet Foot hygiene  Wash your feet daily with warm water and mild soap. Do not use hot water. Then, pat your feet and the areas between your toes until they are completely dry. Do not soak your feet as this can dry your skin.  Trim your toenails straight across. Do not dig under them or around the cuticle. File the edges of your nails with an emery board or nail file.  Apply a moisturizing lotion or petroleum jelly to the skin on your feet and to dry, brittle toenails. Use lotion that does not contain alcohol and is unscented. Do not apply lotion between your toes. Shoes and socks  Wear clean socks or stockings every day. Make sure they are not too tight. Do not wear knee-high stockings since they may decrease blood flow to your legs.  Wear shoes that fit properly and have enough cushioning. Always look in your shoes before you put them on to be sure there are no objects inside.  To break in new shoes, wear them for just a few hours a day. This prevents injuries on your feet. Wounds, scrapes, corns, and calluses  Check your feet daily for blisters, cuts, bruises, sores, and redness. If you cannot see the bottom of your feet, use a mirror or ask someone for help.  Do not cut corns or calluses or try to remove them with medicine.  If you  find a minor scrape, cut, or break in the skin on your feet, keep it and the skin around it clean and dry. You may clean these areas with mild soap and water. Do not clean the area with peroxide, alcohol, or iodine.  If you have a wound, scrape, corn, or callus on your foot, look at it several times a day to make sure it is healing and not infected. Check for: ? Redness, swelling, or pain. ? Fluid or blood. ? Warmth. ? Pus or a bad smell. General instructions  Do not cross your legs. This may decrease blood flow to your feet.  Do not use heating pads or hot water bottles on your feet. They may burn your skin. If you have lost feeling in your feet or legs, you may not know this is happening until it is too late.  Protect your feet from hot and cold by wearing shoes, such as at the beach or on hot pavement.  Schedule a complete foot exam at least once a year (annually) or more often if you have foot problems. If you have foot problems, report any cuts, sores, or bruises to your health care provider immediately. Contact a health care provider if:  You have a medical condition that increases your risk of infection and you have any cuts, sores, or bruises on your feet.  You have an injury that is not   healing.  You have redness on your legs or feet.  You feel burning or tingling in your legs or feet.  You have pain or cramps in your legs and feet.  Your legs or feet are numb.  Your feet always feel cold.  You have pain around a toenail. Get help right away if:  You have a wound, scrape, corn, or callus on your foot and: ? You have pain, swelling, or redness that gets worse. ? You have fluid or blood coming from the wound, scrape, corn, or callus. ? Your wound, scrape, corn, or callus feels warm to the touch. ? You have pus or a bad smell coming from the wound, scrape, corn, or callus. ? You have a fever. ? You have a red line going up your leg. Summary  Check your feet every day  for cuts, sores, red spots, swelling, and blisters.  Moisturize feet and legs daily.  Wear shoes that fit properly and have enough cushioning.  If you have foot problems, report any cuts, sores, or bruises to your health care provider immediately.  Schedule a complete foot exam at least once a year (annually) or more often if you have foot problems. This information is not intended to replace advice given to you by your health care provider. Make sure you discuss any questions you have with your health care provider. Document Released: 01/25/2000 Document Revised: 03/11/2017 Document Reviewed: 02/29/2016 Elsevier Patient Education  2020 Elsevier Inc.  

## 2018-12-14 NOTE — Patient Instructions (Signed)
Visit Information  Goals Addressed            This Visit's Progress     Patient Stated   . I need financial assistance for my medications (pt-stated)       Current Barriers:  . Financial Barriers: patient has [employer] and reports copay for Ozempic (>$500/month) is cost prohibitive at this time  Pharmacist Clinical Goal(s):  [name] Over the next 45 days, patient will work with PharmD and providers to relieve medication access concerns (goal updated due to delays in processing with DSS due to COVID)  Interventions: . Comprehensive medication review completed; medication list updated in electronic medical record.  [name] by [employer]: Patient meets income/NO out of pocket spend criteria for this medication's patient assistance program. Reviewed application process. Patient provided proof of income, out of pocket spend report, and will sign application. Will collaborate with primary care provider, Dr. [name] for their portion of application. Once completed, will submit to [employer] patient assistance program. . Patient completed application in office.  Application faxed to company for approval.  Company denied application due to income, however patient to apply for LIS/extrahelp through medicare.  Assisted patient with application online per verbal permission.  Patient verbalized understanding.  [name] Consulted with [place] to assist patient in applying for [employer].  Application submitted week of 11/15/2018.  Can take up to 45 days for reply . Will call pharmacy to see if LIS/extra help has been approved--no approval yet.  Will continue to follow. . 12/13/18-Sample labeled for patient to pick up at PCP appt. . Achieving this goal will allow patient to continue to control her diabetes.  Patient Self Care Activities:  . Patient will provide necessary portions of application   Please see past updates related to this goal by clicking on the "Past Updates" button  in the selected goal       . I would like to optimize medication management of my chronic conditions. (pt-stated)       Current Barriers:  . Diabetes: T2DM; most recent A1c 8.9% on 09/01/2018 (patient motivated to decrease-recheck on 12/14/2018) . Current antihyperglycemic regimen: Ozempic 0.5mg  weekly, metformin (not taking per MD instruction due to kidney function) . Denies hypoglycemic symptoms; denies hyperglycemic symptoms . Current exercise: light walking, ADLs . Current blood glucose readings: FBG 160-180 . Cardiovascular risk reduction: o Current hypertensive regimen: amlodipine, triamterene/HCTZ, propranolol (refills called in, non-compliant with refills) o Current hyperlipidemia regimen:  atorvastatin 20mg  DAILY #90DS filled 08/01/18 (11/09/18-->patient out of refills, called in refills for all chronic medications)  Pharmacist Clinical Goal(s):  [name] Over the next 90 days, patient with work with PharmD and primary care provider to address needs related to optimized medication management of chronic conditions (diabetes, hypertension, hyperlipidemia)  Interventions: . Comprehensive medication review performed, medication list updated in electronic medical record.  Reviewed medication fill history via insurance claims data confirming patient appears compliant with having her medications filled on time as prescribed by provider. . Reviewed & discussed the following diabetes-related information with patient: o Continue checking blood sugars as directed o Follow ADA recommended "diabetes-friendly" diet  (reviewed healthy snack/food options) o Discussed GLP-1 injection technique; Patient uses One Touch Ultra glucometer o Reviewed medication purpose/side effects  Patient Self Care Activities:  . Patient will check blood glucose daily , document, and provide at future appointments . Patient will focus on medication adherence by continuing to take medications as prescribed. . Patient will take  medications  as prescribed . Patient will contact provider with any episodes of hypoglycemia . Patient will report any questions or concerns to provider   Please see past updates related to this goal by clicking on the "Past Updates" button in the selected goal          The patient verbalized understanding of instructions provided today and declined a print copy of patient instruction materials.   The care management team will reach out to the patient again over the next 30 days.   SIGNATURE Regina Eck, PharmD, BCPS Clinical Pharmacist, [facility] [place]: [phone]

## 2018-12-14 NOTE — Progress Notes (Addendum)
Subjective:     Patient ID: Teresa Stanley , female    DOB: 05-14-1945 , 73 y.o.   MRN: [id number]   Chief Complaint  Patient presents with  . Diabetes  . Hypertension    HPI  Diabetes She presents for her follow-up diabetic visit. She has type 2 diabetes mellitus. Her disease course has been stable. There are no hypoglycemic associated symptoms. Pertinent negatives for diabetes include no blurred vision and no chest pain. There are no hypoglycemic complications. Diabetic complications include nephropathy. Risk factors for coronary artery disease include diabetes mellitus, dyslipidemia, obesity, post-menopausal and sedentary lifestyle. She is compliant with treatment some of the time. Her weight is decreasing steadily. She is following a generally healthy diet. She participates in exercise intermittently. Her breakfast blood glucose is taken between 8-9 am. Her breakfast blood glucose range is generally 130-140 mg/dl. An ACE inhibitor/angiotensin II receptor blocker is being taken. Eye exam is current.  Hypertension The current episode started more than 1 year ago. The problem has been gradually improving since onset. The problem is uncontrolled. Pertinent negatives include no blurred vision, chest pain, palpitations or shortness of breath. The current treatment provides moderate improvement. Compliance problems include exercise.  Hypertensive end-organ damage includes kidney disease.     Past Medical History:  Diagnosis Date  . Arthritis   . Diabetes mellitus    takes Actos and Metformin daily and Victoza  . GERD (gastroesophageal reflux disease)    takes Omeprazole daily  . Headache(784.0)    occasionally  . Hyperlipidemia    takes Atorvastatin daily  . Hypertension    takes Lisinopril,Amlodipine,and Metoprolol daily  . Hypothyroidism   . Joint pain   . Thyroid disease    ?     Family History  Problem Relation Age of Onset  . Diabetes Mother   . Hypertension Mother   .  Diabetes Father   . Asthma Father   . Hypertension Father   . Hypertension Daughter   . Hypertension Sister        2  . Diabetes Sister   . Hypertension Brother        5  . Kidney disease Brother   . Colon cancer Neg Hx   . Esophageal cancer Neg Hx   . Rectal cancer Neg Hx   . Stomach cancer Neg Hx      Current Outpatient Medications:  .  amLODipine (NORVASC) 10 MG tablet, TAKE 1 TABLET BY MOUTH EVERY DAY IN THE EVENING, Disp: 90 tablet, Rfl: 2 .  aspirin 81 MG tablet, Take 81 mg by mouth daily., Disp: , Rfl:  .  atorvastatin (LIPITOR) 20 MG tablet, TAKE 1 TABLET BY ORAL ROUTE EVERY DAY, Disp: 90 tablet, Rfl: 2 .  Blood Glucose Monitoring Suppl (ONE TOUCH ULTRA 2) w/Device KIT, Use as directed to check blood sugars 2 times per day dx:e11.22, Disp: 1 each, Rfl: 1 .  carboxymethylcellulose (REFRESH PLUS) 0.5 % SOLN, Place 1 drop into both eyes daily., Disp: , Rfl:  .  glucose blood (ONE TOUCH ULTRA TEST) test strip, Use as instructed to check blood sugars 2 times per day dx: e11.22, Disp: 300 each, Rfl: 2 .  meclizine (ANTIVERT) 25 MG tablet, TAKE 1 TABLET (25 MG TOTAL) BY MOUTH 3 (THREE) TIMES DAILY AS NEEDED FOR DIZZINESS., Disp: 30 tablet, Rfl: 0 .  methimazole (TAPAZOLE) 5 MG tablet, Take 1 tablet (5 mg total) by mouth daily., Disp: 90 tablet, Rfl: 1 .  omeprazole (PRILOSEC) 20  MG capsule, TAKE ONE CAPSULE BY MOUTH BEFORE A MEAL, Disp: 90 capsule, Rfl: 2 .  OneTouch Delica Lancets 94W MISC, Use as directed to check blood sugars 2 times per day dx: e11.22, Disp: 300 each, Rfl: 2 .  propranolol ER (INDERAL LA) 120 MG 24 hr capsule, TAKE 1 CAPSULE BY MOUTH EVERY DAY, Disp: 90 capsule, Rfl: 0 .  Semaglutide,0.25 or 0.5MG/DOS, (OZEMPIC, 0.25 OR 0.5 MG/DOSE,) 2 MG/1.5ML SOPN, Inject 0.5 mg into the skin once a week., Disp: 3 pen, Rfl: 1 .  triamcinolone cream (KENALOG) 0.1 %, 1 application daily as needed., Disp: , Rfl:  .  triamterene-hydrochlorothiazide (MAXZIDE-25) 37.5-25 MG tablet,  TAKE 1 TABLET BY MOUTH EVERY DAY IN THE MORNING, Disp: 90 tablet, Rfl: 1 .  pneumococcal 13-valent conjugate vaccine (PREVNAR 13) SUSP injection, Inject 0.5 mLs into the muscle tomorrow at 10 am for 1 dose., Disp: 0.5 mL, Rfl: 0   No Known Allergies   Review of Systems  Constitutional: Negative.   Eyes: Negative for blurred vision.  Respiratory: Negative.  Negative for shortness of breath.   Cardiovascular: Negative.  Negative for chest pain and palpitations.  Gastrointestinal: Negative.   Neurological: Negative.   Psychiatric/Behavioral: Negative.      Today's Vitals   12/14/18 1115  BP: 126/78  Pulse: 86  Temp: 98.3 F (36.8 C)  TempSrc: Oral  Weight: 260 lb 6.4 oz (118.1 kg)  Height: 5' 2.2" (1.58 m)  PainSc: 0-No pain   Body mass index is 47.32 kg/m.   Objective:  Physical Exam Vitals signs and nursing note reviewed.  Constitutional:      Appearance: Normal appearance. She is obese.  HENT:     Head: Normocephalic and atraumatic.  Cardiovascular:     Rate and Rhythm: Normal rate and regular rhythm.     Pulses:          Dorsalis pedis pulses are 1+ on the right side and 1+ on the left side.     Heart sounds: Normal heart sounds.  Pulmonary:     Effort: Pulmonary effort is normal.     Breath sounds: Normal breath sounds.  Musculoskeletal:     Comments: Ambulatory with cane. Handicapped sticker application was completed during her visit.   Feet:     Right foot:     Protective Sensation: 5 sites tested. 5 sites sensed.     Skin integrity: Callus present.     Toenail Condition: Right toenails are abnormally thick.     Left foot:     Protective Sensation: 5 sites tested. 5 sites sensed.     Skin integrity: Callus present.     Toenail Condition: Left toenails are abnormally thick.  Skin:    General: Skin is warm.  Neurological:     General: No focal deficit present.     Mental Status: She is alert.  Psychiatric:        Mood and Affect: Mood normal.         Behavior: Behavior normal.         Assessment And Plan:     1. Diabetes mellitus with stage 3 chronic kidney disease (HCC)  Diabetic foot exam was performed.  Since her last visit, she has stopped metformin. She has noticed her sugars have increased. I will check labs as listed below, and make further recommendations. She has already expressed that she does not wish to take insulin. She wants to stick with oral meds at all costs.   - Hemoglobin  A1c - BMP8+EGFR  2. Hypertensive nephropathy  Chronic, well controlled. She will continue with current meds. She is encouraged to avoid adding salt to her foods.   3. Immunization due  Rx Prevnar-13 was sent to her pharmacy. She was encouraged to get within the next two weeks. She is advised to call us once administered so we can update her chart.   4. Class 3 severe obesity due to excess calories with serious comorbidity and body mass index (BMI) of 45.0 to 49.9 in adult [facility])  Importance of achieving optimal weight to decrease risk of cardiovascular disease and cancers was discussed with the patient in full detail. She is encouraged to incorporate more chair exercises into her daily routine. She is encouraged to start slowly - start with 10 minutes twice daily at least three to four days per week and to gradually build to 30 minutes five days weekly.   Maximino Greenland, MD    THE PATIENT IS ENCOURAGED TO PRACTICE SOCIAL DISTANCING DUE TO THE COVID-19 PANDEMIC.

## 2018-12-14 NOTE — Progress Notes (Signed)
Chronic Care Management   Visit Note  12/13/2018 Name: Teresa Stanley MRN: [id number] DOB: March 02, 1945  Referred by: [name], MD Reason for referral : Chronic Care Management   Teresa Stanley is a 73 y.o. year old female who is a primary care patient of [name], MD. The CCM team was consulted for assistance with chronic disease management and care coordination needs related to DMII  Review of patient status, including review of consultants reports, relevant laboratory and other test results, and collaboration with appropriate care team members and the patient's provider was performed as part of comprehensive patient evaluation and provision of chronic care management services.    I spoke with Teresa Stanley by telephone today.  Advanced Directives Status: N See Care Plan and [employer]yn[employer] application for related entries.   Medications: Outpatient Encounter Medications as of 12/13/2018  Medication Sig  . amLODipine (NORVASC) 10 MG tablet TAKE 1 TABLET BY MOUTH EVERY DAY IN THE EVENING  . aspirin 81 MG tablet Take 81 mg by mouth daily.  [name] atorvastatin (LIPITOR) 20 MG tablet TAKE 1 TABLET BY ORAL ROUTE EVERY DAY  . Blood Glucose Monitoring Suppl (ONE TOUCH ULTRA 2) w/Device KIT Use as directed to check blood sugars 2 times per day dx:e11.22  . carboxymethylcellulose (REFRESH PLUS) 0.5 % SOLN Place 1 drop into both eyes daily.  [name] glucose blood (ONE TOUCH ULTRA TEST) test strip Use as instructed to check blood sugars 2 times per day dx: e11.22  . meclizine (ANTIVERT) 25 MG tablet TAKE 1 TABLET (25 MG TOTAL) BY MOUTH 3 (THREE) TIMES DAILY AS NEEDED FOR DIZZINESS. (Patient not taking: Reported on 09/30/2018)  . metFORMIN (GLUCOPHAGE) 1000 MG tablet Take 1 tablet (1,000 mg total) by mouth daily. (Patient not taking: Reported on 12/13/2018)  . methimazole (TAPAZOLE) 5 MG tablet Take 1 tablet (5 mg total) by mouth daily.  [name] omeprazole (PRILOSEC) 20 MG capsule TAKE ONE CAPSULE BY MOUTH BEFORE A MEAL   . OneTouch Delica Lancets 84Z MISC Use as directed to check blood sugars 2 times per day dx: e11.22  . propranolol ER (INDERAL LA) 120 MG 24 hr capsule TAKE 1 CAPSULE BY MOUTH EVERY DAY  . Semaglutide,0.25 or 0.5MG/DOS, (OZEMPIC, 0.25 OR 0.5 MG/DOSE,) 2 MG/1.5ML SOPN Inject 0.5 mg into the skin once a week.  . triamcinolone cream (KENALOG) 0.1 % 1 application daily as needed.  . triamterene-hydrochlorothiazide (MAXZIDE-25) 37.5-25 MG tablet TAKE 1 TABLET BY MOUTH EVERY DAY IN THE MORNING   No facility-administered encounter medications on file as of 12/13/2018.      Objective:   Goals Addressed            This Visit's Progress     Patient Stated   . I need financial assistance for my medications (pt-stated)       Current Barriers:  . Financial Barriers: patient has [employer] and reports copay for Ozempic (>$500/month) is cost prohibitive at this time  Pharmacist Clinical Goal(s):  [name] Over the next 45 days, patient will work with PharmD and providers to relieve medication access concerns (goal updated due to delays in processing with DSS due to COVID)  Interventions: . Comprehensive medication review completed; medication list updated in electronic medical record.  [name] by [employer]: Patient meets income/NO out of pocket spend criteria for this medication's patient assistance program. Reviewed application process. Patient provided proof of income, out of pocket spend report, and will sign application. Will collaborate with primary care provider, Dr.  [name] for their portion of application. Once completed, will submit to [employer] patient assistance program. . Patient completed application in office.  Application faxed to company for approval.  Company denied application due to income, however patient to apply for LIS/extrahelp through medicare.  Assisted patient with application online per verbal permission.  Patient verbalized understanding.  [name] Consulted with [place] to assist patient in applying for [employer].  Application submitted week of 11/15/2018.  Can take up to 45 days for reply . Will call pharmacy to see if LIS/extra help has been approved--no approval yet.  Will continue to follow. . 12/13/18-Sample labeled for patient to pick up at PCP appt. . Achieving this goal will allow patient to continue to control her diabetes.  Patient Self Care Activities:  . Patient will provide necessary portions of application   Please see past updates related to this goal by clicking on the "Past Updates" button in the selected goal       . I would like to optimize medication management of my chronic conditions. (pt-stated)       Current Barriers:  . Diabetes: T2DM; most recent A1c 8.9% on 09/01/2018 (patient motivated to decrease-recheck on 12/14/2018) . Current antihyperglycemic regimen: Ozempic 0.71m weekly, metformin (not taking per MD instruction due to kidney function) . Denies hypoglycemic symptoms; denies hyperglycemic symptoms . Current exercise: light walking, ADLs . Current blood glucose readings: FBG 160-180 . Cardiovascular risk reduction: o Current hypertensive regimen: amlodipine, triamterene/HCTZ, propranolol (refills called in, non-compliant with refills) o Current hyperlipidemia regimen:  atorvastatin 249mDAILY #90DS filled 08/01/18 (11/09/18-->patient out of refills, called in refills for all chronic medications)  Pharmacist Clinical Goal(s):  . Marland Kitchenver the next 90 days, patient with work with PharmD and primary care provider to address needs related to optimized medication management of chronic conditions (diabetes, hypertension, hyperlipidemia)  Interventions: . Comprehensive medication review performed, medication list updated in electronic medical record.  Reviewed medication fill history via insurance claims data confirming patient appears compliant with having her medications filled on time as prescribed by provider. .  Reviewed & discussed the following diabetes-related information with patient: o Continue checking blood sugars as directed o Follow ADA recommended "diabetes-friendly" diet  (reviewed healthy snack/food options) o Discussed GLP-1 injection technique; Patient uses One Touch Ultra glucometer o Reviewed medication purpose/side effects  Patient Self Care Activities:  . Patient will check blood glucose daily , document, and provide at future appointments . Patient will focus on medication adherence by continuing to take medications as prescribed. . Patient will take medications as prescribed . Patient will contact provider with any episodes of hypoglycemia . Patient will report any questions or concerns to provider   Please see past updates related to this goal by clicking on the "Past Updates" button in the selected goal           Plan:   The care management team will reach out to the patient again over the next 30 days.   Provider Signature JuRegina EckPharmD, BCPS Clinical Pharmacist, [phone]

## 2018-12-15 LAB — BMP8+EGFR
BUN/Creatinine Ratio: 16 (ref 12–28)
BUN: 25 mg/dL (ref 8–27)
CO2: 22 mmol/L (ref 20–29)
Calcium: 9.8 mg/dL (ref 8.7–10.3)
Chloride: 99 mmol/L (ref 96–106)
Creatinine, Ser: 1.57 mg/dL — ABNORMAL HIGH (ref 0.57–1.00)
GFR calc Af Amer: 38 mL/min/{1.73_m2} — ABNORMAL LOW (ref >59)
GFR calc non Af Amer: 33 mL/min/{1.73_m2} — ABNORMAL LOW (ref >59)
Glucose: 306 mg/dL — ABNORMAL HIGH (ref 65–99)
Potassium: 6 mmol/L — ABNORMAL HIGH (ref 3.5–5.2)
Sodium: 136 mmol/L (ref 134–144)

## 2018-12-15 LAB — HEMOGLOBIN A1C
Est. average glucose Bld gHb Est-mCnc: 200 mg/dL
Hgb A1c MFr Bld: 8.6 % — ABNORMAL HIGH (ref 4.8–5.6)

## 2018-12-16 ENCOUNTER — Telehealth: Payer: Self-pay

## 2018-12-17 ENCOUNTER — Telehealth: Payer: Self-pay

## 2018-12-17 NOTE — Telephone Encounter (Signed)
-----   Message from [name], MD sent at 12/16/2018 10:41 PM EST ----- Your hba1c is elevated at 8.6.  Goal is less than 7.5. kidney fxn is stable. We can either start insulin, or increase the dose of the Ozempic. Have you been feeling nauseated with this medication? If not, I would consider increasing the dose so we can get your hba1c to goal. I do not think it is wise to continue with metformin due to your declining renal function.

## 2018-12-17 NOTE — Telephone Encounter (Signed)
Unable to leave a message.  I will try back on Monday.

## 2018-12-30 ENCOUNTER — Telehealth: Payer: Self-pay

## 2018-12-30 NOTE — Telephone Encounter (Signed)
The pt was scheduled an appt because she said she is having an issue with her skin.

## 2019-01-01 DIAGNOSIS — L0201 Cutaneous abscess of face: Secondary | ICD-10-CM | POA: Diagnosis not present

## 2019-01-03 ENCOUNTER — Telehealth: Payer: Self-pay

## 2019-01-03 NOTE — Telephone Encounter (Signed)
The pt called and left a message to cancel her appt for Wednesday because she got her boil taken care of.  The pt was asked if she went to urgent care and the pt said yes she went to [facility].

## 2019-01-04 ENCOUNTER — Ambulatory Visit: Payer: Self-pay

## 2019-01-04 DIAGNOSIS — E1122 Type 2 diabetes mellitus with diabetic chronic kidney disease: Secondary | ICD-10-CM

## 2019-01-04 DIAGNOSIS — I129 Hypertensive chronic kidney disease with stage 1 through stage 4 chronic kidney disease, or unspecified chronic kidney disease: Secondary | ICD-10-CM

## 2019-01-04 DIAGNOSIS — N183 Chronic kidney disease, stage 3 unspecified: Secondary | ICD-10-CM

## 2019-01-04 NOTE — Patient Instructions (Signed)
Social Worker Visit Information  Goals we discussed today:  Goals Addressed            This Visit's Progress   . COMPLETED: Assist with care coordination and application submission to receive MQB Medicaid       Current Barriers:  . Lacks understanding of application process  . Limited understanding of Medicaid programs   Clinical Social Work Clinical Goal(s):  . Over the next 45 days the patient will work with SW to complete Medicaid application process  CCM SW Interventions: Completed 01/04/2019 . Outbound call to the patient to assess progression of patient stated goal . "I received my Medicaid card in the mail on Friday" . Advised the patient to provide her new card to her local pharmacy as well as her provider office  . Communication with PharmD Julie Pruitt regarding coverage . Goal Met  Patient Self Care Activities:  . Attends all scheduled provider appointments . Performs ADL's independently . Calls provider office for new concerns or questions . Unable to independently afford prescribed medication  Please see past updates related to this goal by clicking on the "Past Updates" button in the selected goal           Follow Up Plan: No further SW follow up planned at this time. Please call me with future resource needs!   Kendra Humble, BSW, CDP Social Worker, Certified Dementia Practitioner [facility] [phone]         

## 2019-01-04 NOTE — Chronic Care Management (AMB) (Signed)
Chronic Care Management     Social Work Follow Up Note  01/04/2019 Name: Teresa Stanley MRN: [id number] DOB: 02-Jun-1945  Teresa Stanley is a 73 y.o. year old female who is a primary care patient of [name], MD. The CCM team was consulted for assistance with care coordination.   Review of patient status, including review of consultants reports, other relevant assessments, and collaboration with appropriate care team members and the patient's provider was performed as part of comprehensive patient evaluation and provision of chronic care management services.    SW placed an outbound call to the patient to review progression of patient SW goal.   Outpatient Encounter Medications as of 01/04/2019  Medication Sig  . amLODipine (NORVASC) 10 MG tablet TAKE 1 TABLET BY MOUTH EVERY DAY IN THE EVENING  . aspirin 81 MG tablet Take 81 mg by mouth daily.  [name] atorvastatin (LIPITOR) 20 MG tablet TAKE 1 TABLET BY ORAL ROUTE EVERY DAY  . Blood Glucose Monitoring Suppl (ONE TOUCH ULTRA 2) w/Device KIT Use as directed to check blood sugars 2 times per day dx:e11.22  . carboxymethylcellulose (REFRESH PLUS) 0.5 % SOLN Place 1 drop into both eyes daily.  [name] glucose blood (ONE TOUCH ULTRA TEST) test strip Use as instructed to check blood sugars 2 times per day dx: e11.22  . meclizine (ANTIVERT) 25 MG tablet TAKE 1 TABLET (25 MG TOTAL) BY MOUTH 3 (THREE) TIMES DAILY AS NEEDED FOR DIZZINESS.  . methimazole (TAPAZOLE) 5 MG tablet Take 1 tablet (5 mg total) by mouth daily.  [name] omeprazole (PRILOSEC) 20 MG capsule TAKE ONE CAPSULE BY MOUTH BEFORE A MEAL  . OneTouch Delica Lancets 29N MISC Use as directed to check blood sugars 2 times per day dx: e11.22  . propranolol ER (INDERAL LA) 120 MG 24 hr capsule TAKE 1 CAPSULE BY MOUTH EVERY DAY  . Semaglutide,0.25 or 0.5MG/DOS, (OZEMPIC, 0.25 OR 0.5 MG/DOSE,) 2 MG/1.5ML SOPN Inject 0.5 mg into the skin once a week.  . triamcinolone cream (KENALOG) 0.1 % 1 application daily  as needed.  . triamterene-hydrochlorothiazide (MAXZIDE-25) 37.5-25 MG tablet TAKE 1 TABLET BY MOUTH EVERY DAY IN THE MORNING   No facility-administered encounter medications on file as of 01/04/2019.      Goals Addressed            This Visit's Progress   . COMPLETED: Assist with care coordination and application submission to receive MQB Medicaid       Current Barriers:  . Lacks understanding of application process  . Limited understanding of Medicaid programs   Clinical Social Work Clinical Goal(s):  [name] Over the next 45 days the patient will work with SW to complete Medicaid application process  CCM SW Interventions: Completed 01/04/2019 . Outbound call to the patient to assess progression of patient stated goal . "I received my Medicaid card in the mail on Friday" . Advised the patient to provide her new card to her local pharmacy as well as her provider office  . Communication with PharmD Lottie Dawson regarding coverage . Goal Met  Patient Self Care Activities:  . Attends all scheduled provider appointments . Performs ADL's independently . Calls provider office for new concerns or questions . Unable to independently afford prescribed medication  Please see past updates related to this goal by clicking on the "Past Updates" button in the selected goal          Follow Up Plan: No further SW follow up planned at this time.  The patient will remain engaged with embedded PharmD.   Daneen Schick, BSW, CDP Social Worker, Certified Dementia Practitioner [facility] [phone]  Total time spent performing care coordination and/or care management activities with the patient by phone or face to face = 8 minutes.

## 2019-01-05 ENCOUNTER — Ambulatory Visit: Payer: Self-pay | Admitting: Nurse Practitioner

## 2019-01-05 ENCOUNTER — Ambulatory Visit: Payer: Self-pay | Admitting: Pharmacist

## 2019-01-05 DIAGNOSIS — N183 Chronic kidney disease, stage 3 unspecified: Secondary | ICD-10-CM

## 2019-01-05 DIAGNOSIS — E1122 Type 2 diabetes mellitus with diabetic chronic kidney disease: Secondary | ICD-10-CM

## 2019-01-10 NOTE — Patient Instructions (Signed)
Visit Information  Goals Addressed            This Visit's Progress     Patient Stated   . I need financial assistance for my medications (pt-stated)       Current Barriers:  . Financial Barriers: patient has [employer] and reports copay for Ozempic (>$500/month) is cost prohibitive at this time  Pharmacist Clinical Goal(s):  [name] Over the next 45 days, patient will work with PharmD and providers to relieve medication access concerns (goal updated due to delays in processing with DSS due to COVID)  Interventions: . Comprehensive medication review completed; medication list updated in electronic medical record.  [name] by [employer]: Patient meets income/NO out of pocket spend criteria for this medication's patient assistance program. Reviewed application process. Patient provided proof of income, out of pocket spend report, and will sign application. Will collaborate with primary care provider, Dr. [name] for their portion of application. Once completed, will submit to [employer] patient assistance program. . Patient completed application in office.  Application faxed to company for approval.  Company denied application due to income, however patient to apply for LIS/extrahelp through medicare.  Assisted patient with application online per verbal permission.  Patient verbalized understanding.  [name] Consulted with [employer] to assist patient in applying for [employer].  Application submitted week of 11/15/2018.  Can take up to 45 days for reply.  Patient states she received a Medicaid card.  Call placed to [facility] pharmacy to call in new Medicaid ID#, however it was not covering this patient.  Message sent to CCM BSW, Daneen Schick for support . 01/10/19-Sample labeled for patient to pick up at PCP appt. . Achieving this goal will allow patient to continue to control her diabetes.  Patient Self Care Activities:  . Patient will provide necessary portions of application    Please see past updates related to this goal by clicking on the "Past Updates" button in the selected goal       . I would like to optimize medication management of my chronic conditions. (pt-stated)       Current Barriers:  . Diabetes: T2DM; most recent A1c 8.6% on 12/14/18 (A1c was 8.9% on 09/01/2018) . Current antihyperglycemic regimen: Ozempic 0.5mg  weekly-pt reports increasing ozempic to 0.75mg  weekly per MD orders (goal to increase to 1mg  weekly) . Denies hypoglycemic symptoms; denies hyperglycemic symptoms . Current exercise: light walking, ADLs . Current blood glucose readings: FBG 160-180 . Cardiovascular riske reduction: o Current hypertensive regimen: amlodipine, triamterene/HCTZ, propranolol  o Current hyperlipidemia regimen:  atorvastatin 20mg  DAILY #90DS filled 11/16/18  Pharmacist Clinical Goal(s):  [name] Over the next 90 days, patient with work with PharmD and primary care provider to address needs related to optimized medication management of chronic conditions (diabetes, hypertension, hyperlipidemia)  Interventions: . Comprehensive medication review performed, medication list updated in electronic medical record.  Reviewed medication fill history via insurance claims data confirming patient appears compliant with having her medications filled on time as prescribed by provider. . Reviewed & discussed the following diabetes-related information with patient: o Continue checking blood sugars as directed o Follow ADA recommended "diabetes-friendly" diet  (reviewed healthy snack/food options) o Discussed GLP-1 injection technique; Patient uses One Touch Ultra glucometer o Reviewed medication purpose/side effects  Patient Self Care Activities:  . Patient will check blood glucose daily , document, and provide at future appointments . Patient will focus on medication adherence by continuing to take medications as prescribed. [name]  Patient will take medications as prescribed . Patient  will contact provider with any episodes of hypoglycemia . Patient will report any questions or concerns to provider   Please see past updates related to this goal by clicking on the "Past Updates" button in the selected goal          The patient verbalized understanding of instructions provided today and declined a print copy of patient instruction materials.   The care management team will reach out to the patient again over the next 30 days.   SIGNATURE Regina Eck, PharmD, BCPS Clinical Pharmacist, [facility] [place]: [phone]

## 2019-01-10 NOTE — Progress Notes (Signed)
Chronic Care Management    Visit Note  01/05/2019 Name: Teresa Stanley MRN: [id number] DOB: December 19, 1945  Referred by: Teresa Chard, MD Reason for referral : Chronic Care Management   Teresa Stanley is a 73 y.o. year old female who is a primary care patient of Teresa Chard, MD. The CCM team was consulted for assistance with chronic disease management and care coordination needs related to DMII  Review of patient status, including review of consultants reports, relevant laboratory and other test results, and collaboration with appropriate care team members and the patient's provider was performed as part of comprehensive patient evaluation and provision of chronic care management services.    I spoke with [name] by telephone today.  Medications: Outpatient Encounter Medications as of 01/05/2019  Medication Sig  . amLODipine (NORVASC) 10 MG tablet TAKE 1 TABLET BY MOUTH EVERY DAY IN THE EVENING  . aspirin 81 MG tablet Take 81 mg by mouth daily.  [name] atorvastatin (LIPITOR) 20 MG tablet TAKE 1 TABLET BY ORAL ROUTE EVERY DAY  . Blood Glucose Monitoring Suppl (ONE TOUCH ULTRA 2) w/Device KIT Use as directed to check blood sugars 2 times per day dx:e11.22  . carboxymethylcellulose (REFRESH PLUS) 0.5 % SOLN Place 1 drop into both eyes daily.  [name] glucose blood (ONE TOUCH ULTRA TEST) test strip Use as instructed to check blood sugars 2 times per day dx: e11.22  . meclizine (ANTIVERT) 25 MG tablet TAKE 1 TABLET (25 MG TOTAL) BY MOUTH 3 (THREE) TIMES DAILY AS NEEDED FOR DIZZINESS.  . methimazole (TAPAZOLE) 5 MG tablet Take 1 tablet (5 mg total) by mouth daily.  [name] omeprazole (PRILOSEC) 20 MG capsule TAKE ONE CAPSULE BY MOUTH BEFORE A MEAL  . OneTouch Delica Lancets 36U MISC Use as directed to check blood sugars 2 times per day dx: e11.22  . propranolol ER (INDERAL LA) 120 MG 24 hr capsule TAKE 1 CAPSULE BY MOUTH EVERY DAY  . Semaglutide,0.25 or 0.5MG/DOS, (OZEMPIC, 0.25 OR 0.5 MG/DOSE,) 2  MG/1.5ML SOPN Inject 0.5 mg into the skin once a week.  . triamcinolone cream (KENALOG) 0.1 % 1 application daily as needed.  . triamterene-hydrochlorothiazide (MAXZIDE-25) 37.5-25 MG tablet TAKE 1 TABLET BY MOUTH EVERY DAY IN THE MORNING   No facility-administered encounter medications on file as of 01/05/2019.      Objective:   Goals Addressed            This Visit's Progress     Patient Stated   . I need financial assistance for my medications (pt-stated)       Current Barriers:  . Financial Barriers: patient has [employer] and reports copay for Ozempic (>$500/month) is cost prohibitive at this time  Pharmacist Clinical Goal(s):  [name] Over the next 45 days, patient will work with PharmD and providers to relieve medication access concerns (goal updated due to delays in processing with DSS due to COVID)  Interventions: . Comprehensive medication review completed; medication list updated in electronic medical record.  [name] by [employer]: Patient meets income/NO out of pocket spend criteria for this medication's patient assistance program. Reviewed application process. Patient provided proof of income, out of pocket spend report, and will sign application. Will collaborate with primary care provider, Dr. Glendale Stanley for their portion of application. Once completed, will submit to [employer] patient assistance program. . Patient completed application in office.  Application faxed to company for approval.  Company denied application due to income, however patient to apply for  LIS/extrahelp through medicare.  Assisted patient with application online per verbal permission.  Patient verbalized understanding.  [name] Consulted with [place] to assist patient in applying for [employer].  Application submitted week of 11/15/2018.  Can take up to 45 days for reply.  Patient states she received a Medicaid card.  Call placed to [facility] to call in new Medicaid ID#, however  it was not covering this patient.  Message sent to CCM BSW, Daneen Schick for support . 01/10/19-Sample labeled for patient to pick up at PCP appt. . Achieving this goal will allow patient to continue to control her diabetes.  Patient Self Care Activities:  . Patient will provide necessary portions of application   Please see past updates related to this goal by clicking on the "Past Updates" button in the selected goal       . I would like to optimize medication management of my chronic conditions. (pt-stated)       Current Barriers:  . Diabetes: T2DM; most recent A1c 8.6% on 12/14/18 (A1c was 8.9% on 09/01/2018) . Current antihyperglycemic regimen: Ozempic 0.85m weekly-pt reports increasing ozempic to 0.745mweekly per MD orders (goal to increase to 82m36meekly) . Denies hypoglycemic symptoms; denies hyperglycemic symptoms . Current exercise: light walking, ADLs . Current blood glucose readings: FBG 160-180 . Cardiovascular riske reduction: o Current hypertensive regimen: amlodipine, triamterene/HCTZ, propranolol  o Current hyperlipidemia regimen:  atorvastatin 8m18mILY #90DS filled 11/16/18  Pharmacist Clinical Goal(s):  . OvMarland Kitchenr the next 90 days, patient with work with PharmD and primary care provider to address needs related to optimized medication management of chronic conditions (diabetes, hypertension, hyperlipidemia)  Interventions: . Comprehensive medication review performed, medication list updated in electronic medical record.  Reviewed medication fill history via insurance claims data confirming patient appears compliant with having her medications filled on time as prescribed by provider. . Reviewed & discussed the following diabetes-related information with patient: o Continue checking blood sugars as directed o Follow ADA recommended "diabetes-friendly" diet  (reviewed healthy snack/food options) o Discussed GLP-1 injection technique; Patient uses One Touch Ultra glucometer  o Reviewed medication purpose/side effects  Patient Self Care Activities:  . Patient will check blood glucose daily , document, and provide at future appointments . Patient will focus on medication adherence by continuing to take medications as prescribed. . Patient will take medications as prescribed . Patient will contact provider with any episodes of hypoglycemia . Patient will report any questions or concerns to provider   Please see past updates related to this goal by clicking on the "Past Updates" button in the selected goal          Plan:   The care management team will reach out to the patient again over the next 30 days.   Provider Signature JuliRegina EckarmD, BCPS Clinical Pharmacist, [phone]

## 2019-01-11 ENCOUNTER — Ambulatory Visit: Payer: Self-pay

## 2019-01-11 DIAGNOSIS — N183 Chronic kidney disease, stage 3 unspecified: Secondary | ICD-10-CM

## 2019-01-11 DIAGNOSIS — E1122 Type 2 diabetes mellitus with diabetic chronic kidney disease: Secondary | ICD-10-CM

## 2019-01-11 NOTE — Chronic Care Management (AMB) (Signed)
Chronic Care Management     Social Work Follow Up Note  01/11/2019 Name: Teresa Stanley MRN: [id number] DOB: 06/08/1945  Teresa Stanley is a 73 y.o. year old female who is a primary care patient of [name], MD. The CCM team was consulted for assistance with care coordination.   Review of patient status, including review of consultants reports, other relevant assessments, and collaboration with appropriate care team members and the patient's provider was performed as part of comprehensive patient evaluation and provision of chronic care management services.    SW assisted with care coordination by contacting [place] regarding patient Medicaid benefit plan.  Outpatient Encounter Medications as of 01/11/2019  Medication Sig  . amLODipine (NORVASC) 10 MG tablet TAKE 1 TABLET BY MOUTH EVERY DAY IN THE EVENING  . aspirin 81 MG tablet Take 81 mg by mouth daily.  [name] atorvastatin (LIPITOR) 20 MG tablet TAKE 1 TABLET BY ORAL ROUTE EVERY DAY  . Blood Glucose Monitoring Suppl (ONE TOUCH ULTRA 2) w/Device KIT Use as directed to check blood sugars 2 times per day dx:e11.22  . carboxymethylcellulose (REFRESH PLUS) 0.5 % SOLN Place 1 drop into both eyes daily.  [name] glucose blood (ONE TOUCH ULTRA TEST) test strip Use as instructed to check blood sugars 2 times per day dx: e11.22  . meclizine (ANTIVERT) 25 MG tablet TAKE 1 TABLET (25 MG TOTAL) BY MOUTH 3 (THREE) TIMES DAILY AS NEEDED FOR DIZZINESS.  . methimazole (TAPAZOLE) 5 MG tablet Take 1 tablet (5 mg total) by mouth daily.  [name] omeprazole (PRILOSEC) 20 MG capsule TAKE ONE CAPSULE BY MOUTH BEFORE A MEAL  . OneTouch Delica Lancets 53G MISC Use as directed to check blood sugars 2 times per day dx: e11.22  . propranolol ER (INDERAL LA) 120 MG 24 hr capsule TAKE 1 CAPSULE BY MOUTH EVERY DAY  . Semaglutide,0.25 or 0.5MG/DOS, (OZEMPIC, 0.25 OR 0.5 MG/DOSE,) 2 MG/1.5ML SOPN Inject 0.5 mg into the skin once a week.  . triamcinolone cream (KENALOG) 0.1  % 1 application daily as needed.  . triamterene-hydrochlorothiazide (MAXZIDE-25) 37.5-25 MG tablet TAKE 1 TABLET BY MOUTH EVERY DAY IN THE MORNING   No facility-administered encounter medications on file as of 01/11/2019.      Goals Addressed            This Visit's Progress     Patient Stated   . I need financial assistance for my medications (pt-stated)       Current Barriers:  . Financial Barriers: patient has [employer] and reports copay for Ozempic (>$500/month) is cost prohibitive at this time  Pharmacist Clinical Goal(s):  [name] Over the next 45 days, patient will work with PharmD and providers to relieve medication access concerns (goal updated due to delays in processing with DSS due to [place])  CCM SW Interventions: Completed 01/11/2019 with DSS . Collaboration with [place] to review patient Medicaid benefit information o Obtained appropriate Medicaid ID #  o Discussed patient covered under Medicaid Family Planning rather than full benefit o Reviewed covered services under Family Planning Medicaid o Requested a caseworker contact SW to review accuracy of Medicaid coverage due to patients age and lack of need for Family Planning . Collaboration with embedded PharmD Lottie Dawson o Provided accurate Medicaid ID number o Reviewed information needed to apply for patient assistance o Discussed SW plan to request letter from [place] when caseworker contacts SW to assist with determination if patient is eligible to receive LIS  Patient Self Care Activities:  . Patient will provide necessary portions of application   Please see past updates related to this goal by clicking on the "Past Updates" button in the selected goal           Follow Up Plan: SW will follow up with DSS over the next week.   Daneen Schick, BSW, CDP Social Worker, Certified Dementia Practitioner [place] [facility] [place] [facility] [phone]  Total time spent performing care coordination and/or  care management activities with the patient by phone or face to face = 25 minutes.

## 2019-01-12 ENCOUNTER — Other Ambulatory Visit: Payer: Self-pay | Admitting: Internal Medicine

## 2019-01-12 ENCOUNTER — Telehealth: Payer: Self-pay

## 2019-01-12 NOTE — Patient Instructions (Signed)
Social Worker Visit Information  Goals we discussed today:  Goals Addressed            This Visit's Progress     Patient Stated   . I need financial assistance for my medications (pt-stated)       Current Barriers:  . Financial Barriers: patient has [employer] and reports copay for Ozempic (>$500/month) is cost prohibitive at this time  Pharmacist Clinical Goal(s):  [name] Over the next 45 days, patient will work with PharmD and providers to relieve medication access concerns (goal updated due to delays in processing with DSS due to [place])  CCM SW Interventions: Completed 01/11/2019 with DSS . Collaboration with [place] to review patient Medicaid benefit information o Obtained appropriate Medicaid ID #  o Discussed patient covered under Medicaid Family Planning rather than full benefit o Reviewed covered services under Family Planning Medicaid o Requested a caseworker contact SW to review accuracy of Medicaid coverage due to patients age and lack of need for Family Planning . Collaboration with embedded PharmD Lottie Dawson o Provided accurate Medicaid ID number o Reviewed information needed to apply for patient assistance o Discussed SW plan to request letter from [place] when caseworker contacts SW to assist with determination if patient is eligible to receive LIS . Inbound call from caseworker with [place] o Confirmed the patient does in fact have Family Planning Medicaid without prescription drug coverage o Requested letter be faxed to primary care office to assist PharmD with medication assistance - unable to release letter without patient verbal authorization o Attempted to contact patient with caseworker - unsuccessful o Discussed plan to mail duplicate letter to the patients home to allow the patient to hand the letter to PharmD . Outbound call to the patients home number x 2 without success - no voice mailbox established at this time . Outbound call to the  patients mobile number- spoke with the patients son whom reports he will see the patient "tonight" and will relay information regarding letter . Collaboration with embedded PharmD to update on above plan  Patient Self Care Activities:  . Patient will provide necessary portions of application   Please see past updates related to this goal by clicking on the "Past Updates" button in the selected goal           Materials Provided: No. Patient not reached.  Follow Up Plan: Patient will contact embedded PharmD upon receipt of Medicaid letter   Daneen Schick, BSW, CDP Social Worker, Certified Dementia Practitioner [place] [facility] [place] [facility] [phone]

## 2019-01-12 NOTE — Chronic Care Management (AMB) (Signed)
Chronic Care Management   Social Work Follow Up Note  01/11/2019 Name: Teresa Stanley MRN: [id number] DOB: 25-Jun-1945  Teresa Stanley is a 73 y.o. year old female who is a primary care patient of [name], MD. The CCM team was consulted for assistance with care coordination.   Review of patient status, including review of consultants reports, other relevant assessments, and collaboration with appropriate care team members and the patient's provider was performed as part of comprehensive patient evaluation and provision of chronic care management services.    SW received an inbound call from the patients DSS caseworker to discuss Medicaid coverage.  Outpatient Encounter Medications as of 01/11/2019  Medication Sig  . amLODipine (NORVASC) 10 MG tablet TAKE 1 TABLET BY MOUTH EVERY DAY IN THE EVENING  . aspirin 81 MG tablet Take 81 mg by mouth daily.  [name] atorvastatin (LIPITOR) 20 MG tablet TAKE 1 TABLET BY ORAL ROUTE EVERY DAY  . Blood Glucose Monitoring Suppl (ONE TOUCH ULTRA 2) w/Device KIT Use as directed to check blood sugars 2 times per day dx:e11.22  . carboxymethylcellulose (REFRESH PLUS) 0.5 % SOLN Place 1 drop into both eyes daily.  [name] glucose blood (ONE TOUCH ULTRA TEST) test strip Use as instructed to check blood sugars 2 times per day dx: e11.22  . meclizine (ANTIVERT) 25 MG tablet TAKE 1 TABLET (25 MG TOTAL) BY MOUTH 3 (THREE) TIMES DAILY AS NEEDED FOR DIZZINESS.  . methimazole (TAPAZOLE) 5 MG tablet Take 1 tablet (5 mg total) by mouth daily.  [name] omeprazole (PRILOSEC) 20 MG capsule TAKE ONE CAPSULE BY MOUTH BEFORE A MEAL  . OneTouch Delica Lancets 33I MISC Use as directed to check blood sugars 2 times per day dx: e11.22  . propranolol ER (INDERAL LA) 120 MG 24 hr capsule TAKE 1 CAPSULE BY MOUTH EVERY DAY  . Semaglutide,0.25 or 0.5MG/DOS, (OZEMPIC, 0.25 OR 0.5 MG/DOSE,) 2 MG/1.5ML SOPN Inject 0.5 mg into the skin once a week.  . triamcinolone cream (KENALOG) 0.1 % 1 application  daily as needed.  . triamterene-hydrochlorothiazide (MAXZIDE-25) 37.5-25 MG tablet TAKE 1 TABLET BY MOUTH EVERY DAY IN THE MORNING   No facility-administered encounter medications on file as of 01/11/2019.      Goals Addressed            This Visit's Progress     Patient Stated   . I need financial assistance for my medications (pt-stated)       Current Barriers:  . Financial Barriers: patient has [employer] and reports copay for Ozempic (>$500/month) is cost prohibitive at this time  Pharmacist Clinical Goal(s):  [name] Over the next 45 days, patient will work with PharmD and providers to relieve medication access concerns (goal updated due to delays in processing with DSS due to [place])  CCM SW Interventions: Completed 01/11/2019 with DSS . Collaboration with [place] to review patient Medicaid benefit information o Obtained appropriate Medicaid ID #  o Discussed patient covered under Medicaid Family Planning rather than full benefit o Reviewed covered services under Family Planning Medicaid o Requested a caseworker contact SW to review accuracy of Medicaid coverage due to patients age and lack of need for Family Planning . Collaboration with embedded PharmD Lottie Dawson o Provided accurate Medicaid ID number o Reviewed information needed to apply for patient assistance o Discussed SW plan to request letter from [place] when caseworker contacts SW to assist with determination if patient is eligible to receive LIS . Inbound call from  caseworker with [place] o Confirmed the patient does in fact have Family Planning Medicaid without prescription drug coverage o Requested letter be faxed to primary care office to assist PharmD with medication assistance - unable to release letter without patient verbal authorization o Attempted to contact patient with caseworker - unsuccessful o Discussed plan to mail duplicate letter to the patients home to allow the patient to hand  the letter to PharmD . Outbound call to the patients home number x 2 without success - no voice mailbox established at this time . Outbound call to the patients mobile number- spoke with the patients son whom reports he will see the patient "tonight" and will relay information regarding letter . Collaboration with embedded PharmD to update on above plan  Patient Self Care Activities:  . Patient will provide necessary portions of application   Please see past updates related to this goal by clicking on the "Past Updates" button in the selected goal           Follow Up Plan: The patient will follow up with embedded PharmD upon receipt of letter to move forward with medication assistance application.   Daneen Schick, BSW, CDP Social Worker, Certified Dementia Practitioner [place] [facility] [place] [facility] [phone]  Total time spent performing care coordination and/or care management activities with the patient by phone or face to face = 12 minutes.

## 2019-01-12 NOTE — Telephone Encounter (Signed)
The pt was notified that I spoke to Teresa Stanley the pharmacist and she said that the pt got a call yesterday about the pt's medicaid is being worked on.

## 2019-01-13 ENCOUNTER — Telehealth: Payer: Self-pay

## 2019-01-13 ENCOUNTER — Ambulatory Visit: Payer: Self-pay

## 2019-01-13 DIAGNOSIS — E1122 Type 2 diabetes mellitus with diabetic chronic kidney disease: Secondary | ICD-10-CM

## 2019-01-13 NOTE — Chronic Care Management (AMB) (Signed)
Chronic Care Management    Social Work Follow Up Note  01/13/2019 Name: ANNE BOLTZ MRN: [id number] DOB: 07/10/1945  KAMAIYA ANTILLA is a 73 y.o. year old female who is a primary care patient of [name], MD. The CCM team was consulted for assistance with care coordination.   Review of patient status, including review of consultants reports, other relevant assessments, and collaboration with appropriate care team members and the patient's provider was performed as part of comprehensive patient evaluation and provision of chronic care management services.    SW placed an outbound call to the patient to assist with care coordination of Ozempic patient assistance application.  Outpatient Encounter Medications as of 01/13/2019  Medication Sig  . amLODipine (NORVASC) 10 MG tablet TAKE 1 TABLET BY MOUTH EVERY DAY IN THE EVENING  . aspirin 81 MG tablet Take 81 mg by mouth daily.  [name] atorvastatin (LIPITOR) 20 MG tablet TAKE 1 TABLET BY ORAL ROUTE EVERY DAY  . Blood Glucose Monitoring Suppl (ONE TOUCH ULTRA 2) w/Device KIT Use as directed to check blood sugars 2 times per day dx:e11.22  . carboxymethylcellulose (REFRESH PLUS) 0.5 % SOLN Place 1 drop into both eyes daily.  [name] glucose blood (ONE TOUCH ULTRA TEST) test strip Use as instructed to check blood sugars 2 times per day dx: e11.22  . meclizine (ANTIVERT) 25 MG tablet TAKE 1 TABLET (25 MG TOTAL) BY MOUTH 3 (THREE) TIMES DAILY AS NEEDED FOR DIZZINESS.  . methimazole (TAPAZOLE) 5 MG tablet Take 1 tablet (5 mg total) by mouth daily.  [name] omeprazole (PRILOSEC) 20 MG capsule TAKE ONE CAPSULE BY MOUTH BEFORE A MEAL  . OneTouch Delica Lancets 81E MISC Use as directed to check blood sugars 2 times per day dx: e11.22  . propranolol ER (INDERAL LA) 120 MG 24 hr capsule TAKE 1 CAPSULE BY MOUTH EVERY DAY  . Semaglutide,0.25 or 0.5MG/DOS, (OZEMPIC, 0.25 OR 0.5 MG/DOSE,) 2 MG/1.5ML SOPN Inject 0.5 mg into the skin once a week.  . triamcinolone cream  (KENALOG) 0.1 % 1 application daily as needed.  . triamterene-hydrochlorothiazide (MAXZIDE-25) 37.5-25 MG tablet TAKE 1 TABLET BY MOUTH EVERY DAY IN THE MORNING   No facility-administered encounter medications on file as of 01/13/2019.      Goals Addressed            This Visit's Progress     Patient Stated   . I need financial assistance for my medications (pt-stated)   On track    Current Barriers:  . Financial Barriers: patient has [employer] and reports copay for Ozempic (>$500/month) is cost prohibitive at this time  Pharmacist Clinical Goal(s):  [name] Over the next 45 days, patient will work with PharmD and providers to relieve medication access concerns (goal updated due to delays in processing with DSS due to [place])  CCM SW Interventions: Completed 01/13/2019  . Outbound call to the patient to review steps once letter received from [facility] . Determined the patient has received letter in the mail . Advised the patient to bring the letter to her primary providers office over the next week to drop off for embedded PharmD . Discussed importance of PharmD receiving letter to assist with patient assistance application for the patients DM medication . Collaboration with provider office informing of patients plan to drop letter off this afternoon . Collaboration with PharmD to communicate above interventions  Patient Self Care Activities:  . Patient will provide necessary portions of application   Please see past  updates related to this goal by clicking on the "Past Updates" button in the selected goal           Follow Up Plan: No further SW follow up planned at this time. The patient will continue to work with PharmD Blenda Bridegroom, BSW, CDP Social Worker, Certified Dementia Practitioner [place] [facility] [place] Management [phone]  Total time spent performing care coordination and/or care management activities with the patient by phone or face to face = 6  minutes.

## 2019-01-13 NOTE — Telephone Encounter (Signed)
PT CONTACTED TO VERIFY WHEN LAST DIABETIC EYE EXAM WAS PERFORMED PER PT SHE COMPLETED IT WITH DR Shirley Muscat OD

## 2019-01-13 NOTE — Patient Instructions (Signed)
Social Worker Visit Information  Goals we discussed today:  Goals Addressed            This Visit's Progress     Patient Stated   . I need financial assistance for my medications (pt-stated)   On track    Current Barriers:  . Financial Barriers: patient has [employer] and reports copay for Ozempic (>$500/month) is cost prohibitive at this time  Pharmacist Clinical Goal(s):  [name] Over the next 45 days, patient will work with PharmD and providers to relieve medication access concerns (goal updated due to delays in processing with DSS due to [place])  CCM SW Interventions: Completed 01/13/2019  . Outbound call to the patient to review steps once letter received from [facility] . Determined the patient has received letter in the mail . Advised the patient to bring the letter to her primary providers office over the next week to drop off for embedded PharmD . Discussed importance of PharmD receiving letter to assist with patient assistance application for the patients DM medication . Collaboration with provider office informing of patients plan to drop letter off this afternoon . Collaboration with PharmD to communicate above interventions  Patient Self Care Activities:  . Patient will provide necessary portions of application   Please see past updates related to this goal by clicking on the "Past Updates" button in the selected goal           Materials Provided: Verbal education provided regarding process of patient assistance application  Follow Up Plan: No planned SW follow up at this time. Please call me if you need help in the future. Almyra Free will contact you regarding application process.   Daneen Schick, BSW, CDP Social Worker, Certified Dementia Practitioner [place] [facility] [place] [facility] [phone]

## 2019-01-20 ENCOUNTER — Other Ambulatory Visit: Payer: Self-pay | Admitting: Pharmacy Technician

## 2019-01-20 ENCOUNTER — Ambulatory Visit (INDEPENDENT_AMBULATORY_CARE_PROVIDER_SITE_OTHER): Payer: Medicare Other | Admitting: Pharmacist

## 2019-01-20 DIAGNOSIS — N183 Chronic kidney disease, stage 3 unspecified: Secondary | ICD-10-CM | POA: Diagnosis not present

## 2019-01-20 DIAGNOSIS — E1122 Type 2 diabetes mellitus with diabetic chronic kidney disease: Secondary | ICD-10-CM

## 2019-01-20 NOTE — Patient Outreach (Signed)
[facility]) Care Management  01/20/2019  Teresa Stanley [birth year] [id number]                                       Medication Assistance Referral  Referral From: [facility] Embedded RPh Jenne Pane   Medication/Company: [name] / [employer] Patient application portion:  Mailed Provider application portion: Faxed  to Dr. Johnnye Lana Provider address/fax verified via: Office website   Follow up:  Will follow up with patient in 10-14 business days to confirm application(s) have been received.  Maud Deed Chana Bode [place] Certified Pharmacy Technician [place] Management Direct [phone]

## 2019-01-20 NOTE — Progress Notes (Signed)
Chronic Care Management   I Visit Note  01/20/2019 Name: Teresa Stanley MRN: [id number] DOB: 08-20-1945  Referred by: Teresa Chard, MD Reason for referral : Chronic Care Management   Teresa Stanley is a 73 y.o. year old female who is a primary care patient of Teresa Chard, MD. The CCM team was consulted for assistance with chronic disease management and care coordination needs related to DMII  Review of patient status, including review of consultants reports, relevant laboratory and other test results, and collaboration with appropriate care team members and the patient's provider was performed as part of comprehensive patient evaluation and provision of chronic care management services.    I spoke with Ms. Teresa Stanley by telephone today.  Medications: Outpatient Encounter Medications as of 01/20/2019  Medication Sig  . amLODipine (NORVASC) 10 MG tablet TAKE 1 TABLET BY MOUTH EVERY DAY IN THE EVENING  . aspirin 81 MG tablet Take 81 mg by mouth daily.  [name] atorvastatin (LIPITOR) 20 MG tablet TAKE 1 TABLET BY ORAL ROUTE EVERY DAY  . Blood Glucose Monitoring Suppl (ONE TOUCH ULTRA 2) w/Device KIT Use as directed to check blood sugars 2 times per day dx:e11.22  . carboxymethylcellulose (REFRESH PLUS) 0.5 % SOLN Place 1 drop into both eyes daily.  [name] glucose blood (ONE TOUCH ULTRA TEST) test strip Use as instructed to check blood sugars 2 times per day dx: e11.22  . meclizine (ANTIVERT) 25 MG tablet TAKE 1 TABLET (25 MG TOTAL) BY MOUTH 3 (THREE) TIMES DAILY AS NEEDED FOR DIZZINESS.  . methimazole (TAPAZOLE) 5 MG tablet Take 1 tablet (5 mg total) by mouth daily.  [name] omeprazole (PRILOSEC) 20 MG capsule TAKE ONE CAPSULE BY MOUTH BEFORE A MEAL  . OneTouch Delica Lancets 40N MISC Use as directed to check blood sugars 2 times per day dx: e11.22  . propranolol ER (INDERAL LA) 120 MG 24 hr capsule TAKE 1 CAPSULE BY MOUTH EVERY DAY  . Semaglutide,0.25 or 0.5MG/DOS, (OZEMPIC, 0.25 OR 0.5 MG/DOSE,) 2  MG/1.5ML SOPN Inject 0.5 mg into the skin once a week.  . triamcinolone cream (KENALOG) 0.1 % 1 application daily as needed.  . triamterene-hydrochlorothiazide (MAXZIDE-25) 37.5-25 MG tablet TAKE 1 TABLET BY MOUTH EVERY DAY IN THE MORNING   No facility-administered encounter medications on file as of 01/20/2019.     Objective:   Goals Addressed            This Visit's Progress     Patient Stated   . I need financial assistance for my medications (pt-stated)       Current Barriers:  . Financial Barriers: patient has [employer] and reports copay for Ozempic (>$500/month) is cost prohibitive at this time  Pharmacist Clinical Goal(s):  [name] Over the next 60 days, patient will work with PharmD and providers to relieve medication access concerns (goal updated due to delays in processing with DSS due to [place])  Interventions: Call completed with patient on 01/20/19 . Comprehensive medication review completed; medication list updated in electronic medical record.  [name] by [employer]: Patient meets income/NO out of pocket spend criteria for this medication's patient assistance program. Reviewed application process. Patient provided proof of income, out of pocket spend report, and will sign application. Will collaborate with primary care provider, Dr. Glendale Stanley for their portion of application. Once completed, will submit to [employer] patient assistance program. . Patient completed application in office.  Application faxed to company for approval.  Company denied application due to income,  however patient to apply for LIS/extrahelp through medicare.  Assisted patient with application online per verbal permission.  Patient verbalized understanding.  [name] Consulted with [place] to assist patient in applying for [employer].  Application submitted week of 11/15/2018.  Can take up to 45 days for reply.  Patient denied for Medicaid/extra help.  Application prepared and  submitted for 2021 on 01/20/2019 . Achieving this goal will allow patient to continue to control her diabetes.  CCM SW Interventions: Completed 01/13/2019  . Outbound call to the patient to review steps once letter received from [facility] . Determined the patient has received letter in the mail . Advised the patient to bring the letter to her primary providers office over the next week to drop off for embedded PharmD . Discussed importance of PharmD receiving letter to assist with patient assistance application for the patients DM medication . Collaboration with provider office informing of patients plan to drop letter off this afternoon . Collaboration with PharmD to communicate above interventions  Patient Self Care Activities:  . Patient will provide necessary portions of application   Please see past updates related to this goal by clicking on the "Past Updates" button in the selected goal       . I would like to optimize medication management of my chronic conditions. (pt-stated)       Current Barriers:  . Diabetes: T2DM; most recent A1c 8.6% on 12/14/18 (A1c was 8.9% on 09/01/2018) . Current antihyperglycemic regimen: Ozempic 0.65m weekly-pt reports increasing ozempic to 0.762mweekly per MD orders (goal to increase to 25m67meekly)--tolerating well, denies side effects . Denies hypoglycemic symptoms; denies hyperglycemic symptoms . Current exercise: light walking, ADLs . Current blood glucose readings: FBG 160-180 . Current diet: patient admits to eating desserts and increased carbs around the holidays, counseled on diet/exercise/lifestyle . Cardiovascular riske reduction: o Current hypertensive regimen: amlodipine, triamterene/HCTZ, propranolol  o Current hyperlipidemia regimen:  atorvastatin 60m69mILY #90DS filled 11/16/18  Pharmacist Clinical Goal(s):  . OvMarland Kitchenr the next 90 days, patient with work with PharmD and primary care provider to address needs related to optimized medication  management of chronic conditions (diabetes, hypertension, hyperlipidemia)  Interventions: . Comprehensive medication review performed, medication list updated in electronic medical record.  Reviewed medication fill history via insurance claims data confirming patient appears compliant with having her medications filled on time as prescribed by provider. . Reviewed & discussed the following diabetes-related information with patient: o Continue checking blood sugars as directed o Follow ADA recommended "diabetes-friendly" diet  (reviewed healthy snack/food options) o Discussed GLP-1 injection technique; Patient uses One Touch Ultra glucometer o Reviewed medication purpose/side effects  Patient Self Care Activities:  . Patient will check blood glucose daily , document, and provide at future appointments . Patient will focus on medication adherence by continuing to take medications as prescribed. . Patient will take medications as prescribed . Patient will contact provider with any episodes of hypoglycemia . Patient will report any questions or concerns to provider   Please see past updates related to this goal by clicking on the "Past Updates" button in the selected goal          Plan:   The care management team will reach out to the patient again over the next 30 days.   Provider Signature JuliRegina EckarmD, BCPS Clinical Pharmacist, [phone]

## 2019-01-20 NOTE — Patient Instructions (Signed)
Visit Information  Goals Addressed            This Visit's Progress     Patient Stated   . I need financial assistance for my medications (pt-stated)       Current Barriers:  . Financial Barriers: patient has [employer] and reports copay for Ozempic (>$500/month) is cost prohibitive at this time  Pharmacist Clinical Goal(s):  [name] Over the next 60 days, patient will work with PharmD and providers to relieve medication access concerns (goal updated due to delays in processing with DSS due to [place])  Interventions: Call completed with patient on 01/20/19 . Comprehensive medication review completed; medication list updated in electronic medical record.  [name] by [employer]: Patient meets income/NO out of pocket spend criteria for this medication's patient assistance program. Reviewed application process. Patient provided proof of income, out of pocket spend report, and will sign application. Will collaborate with primary care provider, Dr. [name] for their portion of application. Once completed, will submit to [employer] patient assistance program. . Patient completed application in office.  Application faxed to company for approval.  Company denied application due to income, however patient to apply for LIS/extrahelp through medicare.  Assisted patient with application online per verbal permission.  Patient verbalized understanding.  [name] Consulted with [place] to assist patient in applying for [employer].  Application submitted week of 11/15/2018.  Can take up to 45 days for reply.  Patient denied for Medicaid/extra help.  Application prepared and submitted for 2021 on 01/20/2019 . Achieving this goal will allow patient to continue to control her diabetes.  CCM SW Interventions: Completed 01/13/2019  . Outbound call to the patient to review steps once letter received from [facility] . Determined the patient has received letter in the mail . Advised the patient  to bring the letter to her primary providers office over the next week to drop off for embedded PharmD . Discussed importance of PharmD receiving letter to assist with patient assistance application for the patients DM medication . Collaboration with provider office informing of patients plan to drop letter off this afternoon . Collaboration with PharmD to communicate above interventions  Patient Self Care Activities:  . Patient will provide necessary portions of application   Please see past updates related to this goal by clicking on the "Past Updates" button in the selected goal       . I would like to optimize medication management of my chronic conditions. (pt-stated)       Current Barriers:  . Diabetes: T2DM; most recent A1c 8.6% on 12/14/18 (A1c was 8.9% on 09/01/2018) . Current antihyperglycemic regimen: Ozempic 0.5mg  weekly-pt reports increasing ozempic to 0.75mg  weekly per MD orders (goal to increase to 1mg  weekly)--tolerating well, denies side effects . Denies hypoglycemic symptoms; denies hyperglycemic symptoms . Current exercise: light walking, ADLs . Current blood glucose readings: FBG 160-180 . Current diet: patient admits to eating desserts and increased carbs around the holidays, counseled on diet/exercise/lifestyle . Cardiovascular riske reduction: o Current hypertensive regimen: amlodipine, triamterene/HCTZ, propranolol  o Current hyperlipidemia regimen:  atorvastatin 20mg  DAILY #90DS filled 11/16/18  Pharmacist Clinical Goal(s):  [name] Over the next 90 days, patient with work with PharmD and primary care provider to address needs related to optimized medication management of chronic conditions (diabetes, hypertension, hyperlipidemia)  Interventions: . Comprehensive medication review performed, medication list updated in electronic medical record.  Reviewed medication fill history via insurance claims data confirming patient appears compliant  with having her medications filled  on time as prescribed by provider. . Reviewed & discussed the following diabetes-related information with patient: o Continue checking blood sugars as directed o Follow ADA recommended "diabetes-friendly" diet  (reviewed healthy snack/food options) o Discussed GLP-1 injection technique; Patient uses One Touch Ultra glucometer o Reviewed medication purpose/side effects  Patient Self Care Activities:  . Patient will check blood glucose daily , document, and provide at future appointments . Patient will focus on medication adherence by continuing to take medications as prescribed. . Patient will take medications as prescribed . Patient will contact provider with any episodes of hypoglycemia . Patient will report any questions or concerns to provider   Please see past updates related to this goal by clicking on the "Past Updates" button in the selected goal          The patient verbalized understanding of instructions provided today and declined a print copy of patient instruction materials.   The care management team will reach out to the patient again over the next 30 days.   SIGNATURE Regina Eck, PharmD, BCPS Clinical Pharmacist, [facility] [place]: [phone]

## 2019-01-25 ENCOUNTER — Other Ambulatory Visit: Payer: Self-pay | Admitting: Internal Medicine

## 2019-01-31 ENCOUNTER — Other Ambulatory Visit: Payer: Self-pay | Admitting: Pharmacy Technician

## 2019-01-31 NOTE — Patient Outreach (Signed)
[facility]) Care Management  01/31/2019  CHARLEIGH HOLLERAN 1945-02-12 [id number]   Received patient and provider portion(s) of patient assistance application(s) for Ozempic. Will fax completed application and required documents (including LIS denial letter) into [employer] on 02/14/2019.   Maud Deed Chana Bode [place] Certified Pharmacy Technician [place] Management Direct [phone]

## 2019-02-16 ENCOUNTER — Other Ambulatory Visit: Payer: Self-pay

## 2019-02-16 ENCOUNTER — Telehealth: Payer: Self-pay

## 2019-02-16 ENCOUNTER — Encounter: Payer: Self-pay | Admitting: Internal Medicine

## 2019-02-16 ENCOUNTER — Telehealth (INDEPENDENT_AMBULATORY_CARE_PROVIDER_SITE_OTHER): Payer: Medicare Other | Admitting: Internal Medicine

## 2019-02-16 VITALS — BP 126/61 | HR 103 | Temp 98.3°F | Ht 62.2 in

## 2019-02-16 DIAGNOSIS — R0982 Postnasal drip: Secondary | ICD-10-CM | POA: Diagnosis not present

## 2019-02-16 DIAGNOSIS — J00 Acute nasopharyngitis [common cold]: Secondary | ICD-10-CM | POA: Diagnosis not present

## 2019-02-16 NOTE — Telephone Encounter (Signed)
The pt was notified that her samples of ozempic is ready for pickup.

## 2019-02-16 NOTE — Patient Instructions (Signed)
Postnasal Drip Postnasal drip is the feeling of mucus going down the back of your throat. Mucus is a slimy substance that moistens and cleans your nose and throat, as well as the air pockets in face bones near your forehead and cheeks (sinuses). Small amounts of mucus pass from your nose and sinuses down the back of your throat all the time. This is normal. When you produce too much mucus or the mucus gets too thick, you can feel it. Some common causes of postnasal drip include:  Having more mucus because of: ? A cold or the flu. ? Allergies. ? Cold air. ? Certain medicines.  Having more mucus that is thicker because of: ? A sinus or nasal infection. ? Dry air. ? A food allergy. Follow these instructions at home: Relieving discomfort   Gargle with a salt-water mixture 3-4 times a day or as needed. To make a salt-water mixture, completely dissolve -1 tsp of salt in 1 cup of warm water.  If the air in your home is dry, use a humidifier to add moisture to the air.  Use a saline spray or container (neti pot) to flush out the nose (nasal irrigation). These methods can help clear away mucus and keep the nasal passages moist. General instructions  Take over-the-counter and prescription medicines only as told by your health care provider.  Follow instructions from your health care provider about eating or drinking restrictions. You may need to avoid caffeine.  Avoid things that you know you are allergic to (allergens), like dust, mold, pollen, pets, or certain foods.  Drink enough fluid to keep your urine pale yellow.  Keep all follow-up visits as told by your health care provider. This is important. Contact a health care provider if:  You have a fever.  You have a sore throat.  You have difficulty swallowing.  You have headache.  You have sinus pain.  You have a cough that does not go away.  The mucus from your nose becomes thick and is green or yellow in color.  You have  cold or flu symptoms that last more than 10 days. Summary  Postnasal drip is the feeling of mucus going down the back of your throat.  If your health care provider approves, use nasal irrigation or a nasal spray 2?4 times a day.  Avoid things that you know you are allergic to (allergens), like dust, mold, pollen, pets, or certain foods. This information is not intended to replace advice given to you by your health care provider. Make sure you discuss any questions you have with your health care provider. Document Revised: 05/21/2018 Document Reviewed: 05/12/2016 Elsevier Patient Education  2020 Elsevier Inc.  

## 2019-02-22 ENCOUNTER — Telehealth: Payer: Self-pay

## 2019-02-22 NOTE — Telephone Encounter (Signed)
Spoke w/pt informed her that her Ozempic is here and ready to be picked up. Pt stated she would have someone come to the office today to pick it up

## 2019-02-23 ENCOUNTER — Other Ambulatory Visit: Payer: Self-pay | Admitting: Pharmacy Technician

## 2019-02-23 NOTE — Patient Outreach (Signed)
[facility]) Care Management  02/23/2019  Teresa Stanley 1945-03-29 [id number]   Received approval confirmation sheet from [place].  Patient has been approved for [employer] until 01/10/2020.  Patient assistance has been completed, will remove myself from care team.  Maud Deed. Chana Bode [place] Certified Pharmacy Technician [place] Management Direct Dial:[phone]

## 2019-02-27 NOTE — Progress Notes (Signed)
Virtual Visit via Phone   This visit type was conducted due to national recommendations for restrictions regarding the COVID-19 Pandemic (e.g. social distancing) in an effort to limit this patient's exposure and mitigate transmission in our community.  Due to her co-morbid illnesses, this patient is at least at moderate risk for complications without adequate follow up.  This format is felt to be most appropriate for this patient at this time.  All issues noted in this document were discussed and addressed.  A limited physical exam was performed with this format.    This visit type was conducted due to national recommendations for restrictions regarding the COVID-19 Pandemic (e.g. social distancing) in an effort to limit this patient's exposure and mitigate transmission in our community.  Patients identity confirmed using two different identifiers.  This format is felt to be most appropriate for this patient at this time.  All issues noted in this document were discussed and addressed.  No physical exam was performed (except for noted visual exam findings with Video Visits).    Date:  02/27/2019   ID:  Teresa Stanley, DOB 1945/12/09, MRN [id number]  Patient Location:  Home  Provider location:   Office    Chief Complaint:  "I have a cold, but I don't have COVID"  History of Present Illness:    Teresa Stanley is a 74 y.o. female who presents via video conferencing for a telehealth visit today.    The patient does have symptoms concerning for COVID-19 infection (fever, chills, cough, or new shortness of breath).   She presents today for virtual visit. She prefers this method of contact due to COVID-19 pandemic.  She presents today for further evaluation of cough. She describes it as non-productive. No fever/chills. She does have some sinus congestion, and feels like something is dripping down the back of her throat. She had COVID test last Saturday and it was negative. She denies ill contacts.  Denies SOB.   Cough This is a new problem. The current episode started in the past 7 days. The problem has been unchanged. The cough is non-productive. Associated symptoms include nasal congestion and postnasal drip. Pertinent negatives include no chills, ear congestion, ear pain, fever or headaches. The symptoms are aggravated by lying down. She has tried OTC cough suppressant for the symptoms. The treatment provided mild relief.     Past Medical History:  Diagnosis Date  . Arthritis   . Diabetes mellitus    takes Actos and Metformin daily and Victoza  . GERD (gastroesophageal reflux disease)    takes Omeprazole daily  . Headache(784.0)    occasionally  . Hyperlipidemia    takes Atorvastatin daily  . Hypertension    takes Lisinopril,Amlodipine,and Metoprolol daily  . Hypothyroidism   . Joint pain   . Thyroid disease    ?   Past Surgical History:  Procedure Laterality Date  . ABDOMINAL HYSTERECTOMY    . BREAST BIOPSY Left   . CARDIAC CATHETERIZATION  2009  . CATARACT EXTRACTION Right   . EYE SURGERY    . JOINT REPLACEMENT    . KNEE ARTHROPLASTY Left 04/04/2013   Procedure: COMPUTER ASSISTED TOTAL KNEE ARTHROPLASTY;  Surgeon: Marybelle Killings, MD;  Location: [facility];  Service: Orthopedics;  Laterality: Left;  Left Total Knee Arthroplasty, Cemented  . TOTAL KNEE ARTHROPLASTY Right 11/26/2015   Procedure: TOTAL KNEE ARTHROPLASTY;  Surgeon: Marybelle Killings, MD;  Location: [facility];  Service: Orthopedics;  Laterality: Right;  Current Meds  Medication Sig  . amLODipine (NORVASC) 10 MG tablet TAKE 1 TABLET BY MOUTH EVERY DAY IN THE EVENING  . aspirin 81 MG tablet Take 81 mg by mouth daily.  [name] atorvastatin (LIPITOR) 20 MG tablet TAKE 1 TABLET BY ORAL ROUTE EVERY DAY  . Blood Glucose Monitoring Suppl (ONE TOUCH ULTRA 2) w/Device KIT Use as directed to check blood sugars 2 times per day dx:e11.22  . carboxymethylcellulose (REFRESH PLUS) 0.5 % SOLN Place 1 drop into both eyes daily.  [name]  glucose blood (ONE TOUCH ULTRA TEST) test strip Use as instructed to check blood sugars 2 times per day dx: e11.22  . meclizine (ANTIVERT) 25 MG tablet TAKE 1 TABLET (25 MG TOTAL) BY MOUTH 3 (THREE) TIMES DAILY AS NEEDED FOR DIZZINESS.  . methimazole (TAPAZOLE) 5 MG tablet Take 1 tablet (5 mg total) by mouth daily.  [name] omeprazole (PRILOSEC) 20 MG capsule TAKE ONE CAPSULE BY MOUTH BEFORE A MEAL  . OneTouch Delica Lancets 08Q MISC Use as directed to check blood sugars 2 times per day dx: e11.22  . propranolol ER (INDERAL LA) 120 MG 24 hr capsule TAKE 1 CAPSULE BY MOUTH EVERY DAY  . Semaglutide,0.25 or 0.5MG/DOS, (OZEMPIC, 0.25 OR 0.5 MG/DOSE,) 2 MG/1.5ML SOPN Inject 0.5 mg into the skin once a week. (Patient taking differently: Inject 0.5 mg into the skin once a week. Pt states she takes 0.25 on Wednesday and 0.5 on Sunday)  . triamcinolone cream (KENALOG) 0.1 % 1 application daily as needed.  . triamterene-hydrochlorothiazide (MAXZIDE-25) 37.5-25 MG tablet TAKE 1 TABLET BY MOUTH EVERY DAY IN THE MORNING     Allergies:   Patient has no known allergies.   Social History   Tobacco Use  . Smoking status: Never Smoker  . Smokeless tobacco: Never Used  Substance Use Topics  . Alcohol use: No  . Drug use: No     Family Hx: The patient's family history includes Asthma in her father; Diabetes in her father, mother, and sister; Hypertension in her brother, daughter, father, mother, and sister; Kidney disease in her brother. There is no history of Colon cancer, Esophageal cancer, Rectal cancer, or Stomach cancer.  ROS:   Please see the history of present illness.    Review of Systems  Constitutional: Negative.  Negative for chills and fever.  HENT: Positive for congestion and postnasal drip. Negative for ear pain.        She c/o scratchy throat. Denies fever/chills.   Respiratory: Positive for cough.   Cardiovascular: Negative.   Gastrointestinal: Negative.   Neurological: Negative.  Negative  for headaches.  Psychiatric/Behavioral: Negative.     All other systems reviewed and are negative.   Labs/Other Tests and Data Reviewed:    Recent Labs: 12/14/2018: BUN 25; Creatinine, Ser 1.57; Potassium 6.0; Sodium 136   Recent Lipid Panel Lab Results  Component Value Date/Time   CHOL 154 06/02/2018 10:29 AM   TRIG 94 06/02/2018 10:29 AM   HDL 61 06/02/2018 10:29 AM   CHOLHDL 2.5 06/02/2018 10:29 AM   CHOLHDL 2.4 Ratio 01/29/2010 08:39 PM   LDLCALC 74 06/02/2018 10:29 AM    Wt Readings from Last 3 Encounters:  12/14/18 260 lb 6.4 oz (118.1 kg)  11/09/18 262 lb (118.8 kg)  09/01/18 265 lb (120.2 kg)     Exam:    Vital Signs:  BP 126/61 Comment: pt provided  Pulse (!) 103 Comment: pt provided  Temp 98.3 F (36.8 C) Comment: pt provided  Ht 5' 2.2" (1.58 m)   BMI 47.32 kg/m     Physical Exam  Pulmonary/Chest: Effort normal.  She is able to speak in full sentences w/o labored breathing.   PE not performed since this is a phone visit.   ASSESSMENT & PLAN:     1. Coryza  She reports recent negative COVID test. She is encouraged to let me know if her sx persist/worsen. She understands she may need repeat testing. She is encouraged to avoid dairy products until she is feeling better.  2. Postnasal drip  She is advised to try loratadine 53m daily.     COVID-19 Education: The signs and symptoms of COVID-19 were discussed with the patient and how to seek care for testing (follow up with PCP or arrange E-visit).  The importance of social distancing was discussed today.  Patient Risk:   After full review of this patients clinical status, I feel that they are at least moderate risk at this time.  Time:   Today, I have spent 13 minutes with the patient with telehealth technology discussing above diagnoses.     Medication Adjustments/Labs and Tests Ordered: Current medicines are reviewed at length with the patient today.  Concerns regarding medicines are outlined  above.   Tests Ordered: No orders of the defined types were placed in this encounter.   Medication Changes: No orders of the defined types were placed in this encounter.   Disposition:  Follow up prn  Signed, RMaximino Greenland MD

## 2019-03-02 ENCOUNTER — Telehealth: Payer: Self-pay

## 2019-03-10 ENCOUNTER — Other Ambulatory Visit: Payer: Self-pay | Admitting: Internal Medicine

## 2019-03-10 DIAGNOSIS — Z853 Personal history of malignant neoplasm of breast: Secondary | ICD-10-CM

## 2019-03-17 ENCOUNTER — Ambulatory Visit (INDEPENDENT_AMBULATORY_CARE_PROVIDER_SITE_OTHER): Payer: Medicare Other

## 2019-03-17 ENCOUNTER — Other Ambulatory Visit: Payer: Self-pay

## 2019-03-17 ENCOUNTER — Encounter: Payer: Self-pay | Admitting: Internal Medicine

## 2019-03-17 ENCOUNTER — Ambulatory Visit (INDEPENDENT_AMBULATORY_CARE_PROVIDER_SITE_OTHER): Payer: Medicare Other | Admitting: Internal Medicine

## 2019-03-17 VITALS — BP 118/74 | HR 87 | Temp 98.7°F | Ht 62.2 in | Wt 255.0 lb

## 2019-03-17 DIAGNOSIS — E1122 Type 2 diabetes mellitus with diabetic chronic kidney disease: Secondary | ICD-10-CM

## 2019-03-17 DIAGNOSIS — Z23 Encounter for immunization: Secondary | ICD-10-CM | POA: Diagnosis not present

## 2019-03-17 DIAGNOSIS — Z0000 Encounter for general adult medical examination without abnormal findings: Secondary | ICD-10-CM | POA: Diagnosis not present

## 2019-03-17 DIAGNOSIS — Z6841 Body Mass Index (BMI) 40.0 and over, adult: Secondary | ICD-10-CM

## 2019-03-17 DIAGNOSIS — N183 Chronic kidney disease, stage 3 unspecified: Secondary | ICD-10-CM | POA: Diagnosis not present

## 2019-03-17 DIAGNOSIS — I129 Hypertensive chronic kidney disease with stage 1 through stage 4 chronic kidney disease, or unspecified chronic kidney disease: Secondary | ICD-10-CM | POA: Diagnosis not present

## 2019-03-17 LAB — POCT URINALYSIS DIPSTICK
Bilirubin, UA: NEGATIVE
Blood, UA: NEGATIVE
Glucose, UA: POSITIVE — AB
Ketones, UA: NEGATIVE
Leukocytes, UA: NEGATIVE
Nitrite, UA: NEGATIVE
Protein, UA: POSITIVE — AB
Spec Grav, UA: 1.02 (ref 1.010–1.025)
Urobilinogen, UA: 0.2 E.U./dL
pH, UA: 5.5 (ref 5.0–8.0)

## 2019-03-17 LAB — POCT UA - MICROALBUMIN
Creatinine, POC: 200 mg/dL
Microalbumin Ur, POC: 150 mg/L

## 2019-03-17 MED ORDER — PREVNAR 13 IM SUSP: 0.5000 mL | INTRAMUSCULAR | 0 refills | Status: AC

## 2019-03-17 NOTE — Progress Notes (Signed)
This visit occurred during the SARS-CoV-2 public health emergency.  Safety protocols were in place, including screening questions prior to the visit, additional usage of staff PPE, and extensive cleaning of exam room while observing appropriate contact time as indicated for disinfecting solutions.  Subjective:   Teresa Stanley is a 74 y.o. female who presents for Medicare Annual (Subsequent) preventive examination.  Review of Systems:  n/a Cardiac Risk Factors include: advanced age (>95mn, >>63women);diabetes mellitus;hypertension;obesity (BMI >30kg/m2);sedentary lifestyle     Objective:     Vitals: BP 118/74   Pulse 87   Temp 98.7 F (37.1 C) (Oral)   Ht 5' 2.2" (1.58 m)   Wt 255 lb (115.7 kg)   BMI 46.34 kg/m   Body mass index is 46.34 kg/m.  Advanced Directives 03/17/2019 05/27/2018 01/16/2016 11/27/2015 11/16/2015 04/04/2013 03/24/2013  Does Patient Have a Medical Advance Directive? Yes Yes Yes No No Patient has advance directive, copy not in chart Patient has advance directive, copy not in chart  Type of Advance Directive [place] will [place] will - - - HSpecial educational needs [place] Does patient want to make changes to medical advance directive? - No - Patient declined - - - No change requested -  Copy of [place] Chart? No - copy requested No - copy requested - - - - Copy requested from family  Would patient like information on creating a medical advance directive? - - - No - patient declined information - - -    Tobacco Social History   Tobacco Use  Smoking Status Never Smoker  Smokeless Tobacco Never Used     Counseling given: Not Answered   Clinical Intake:  Pre-visit preparation completed: Yes  Pain : No/denies pain     Nutritional Status: BMI > 30  Obese Nutritional Risks: None Diabetes: Yes  How often do you need to have someone help you when you read  instructions, pamphlets, or other written materials from your doctor or pharmacy?: 1 - Never What is the last grade level you completed in school?: 12th grade  Interpreter Needed?: No  Information entered by :: NAllen LPN  Past Medical History:  Diagnosis Date  . Arthritis   . Diabetes mellitus    takes Actos and Metformin daily and Victoza  . GERD (gastroesophageal reflux disease)    takes Omeprazole daily  . Headache(784.0)    occasionally  . Hyperlipidemia    takes Atorvastatin daily  . Hypertension    takes Lisinopril,Amlodipine,and Metoprolol daily  . Hypothyroidism   . Joint pain   . Thyroid disease    ?   Past Surgical History:  Procedure Laterality Date  . ABDOMINAL HYSTERECTOMY    . BREAST BIOPSY Left   . CARDIAC CATHETERIZATION  2009  . CATARACT EXTRACTION Right   . EYE SURGERY    . JOINT REPLACEMENT    . KNEE ARTHROPLASTY Left 04/04/2013   Procedure: COMPUTER ASSISTED TOTAL KNEE ARTHROPLASTY;  Surgeon: MMarybelle Killings MD;  [facility]  Service: Orthopedics;  Laterality: Left;  Left Total Knee Arthroplasty, Cemented  . TOTAL KNEE ARTHROPLASTY Right 11/26/2015   Procedure: TOTAL KNEE ARTHROPLASTY;  Surgeon: MMarybelle Killings MD;  [facility]  Service: Orthopedics;  Laterality: Right;   Family History  Problem Relation Age of Onset  . Diabetes Mother   . Hypertension Mother   . Diabetes Father   . Asthma Father   .  Hypertension Father   . Hypertension Daughter   . Hypertension Sister        2  . Diabetes Sister   . Hypertension Brother        5  . Kidney disease Brother   . Colon cancer Neg Hx   . Esophageal cancer Neg Hx   . Rectal cancer Neg Hx   . Stomach cancer Neg Hx    Social History   Socioeconomic History  . Marital status: Widowed    Spouse name: Not on file  . Number of children: 4  . Years of education: Not on file  . Highest education level: Not on file  Occupational History  . Occupation: retired  Tobacco Use  . Smoking  status: Never Smoker  . Smokeless tobacco: Never Used  Substance and Sexual Activity  . Alcohol use: No  . Drug use: No  . Sexual activity: Not Currently    Birth control/protection: Surgical  Other Topics Concern  . Not on file  Social History Narrative  . Not on file   Social Determinants of Health   Financial Resource Strain: Low Risk   . Difficulty of Paying Living Expenses: Not hard at all  Food Insecurity: No Food Insecurity  . Worried About Charity fundraiser in the Last Year: Never true  . Ran Out of Food in the Last Year: Never true  Transportation Needs: No Transportation Needs  . Lack of Transportation (Medical): No  . Lack of Transportation (Non-Medical): No  Physical Activity: Insufficiently Active  . Days of Exercise per Week: 2 days  . Minutes of Exercise per Session: 10 min  Stress: No Stress Concern Present  . Feeling of Stress : Not at all  Social Connections:   . Frequency of Communication with Friends and Family: Not on file  . Frequency of Social Gatherings with Friends and Family: Not on file  . Attends Religious Services: Not on file  . Active Member of Clubs or Organizations: Not on file  . Attends Archivist Meetings: Not on file  . Marital Status: Not on file    Outpatient Encounter Medications as of 03/17/2019  Medication Sig  . amLODipine (NORVASC) 10 MG tablet TAKE 1 TABLET BY MOUTH EVERY DAY IN THE EVENING  . aspirin 81 MG tablet Take 81 mg by mouth daily.  [name] atorvastatin (LIPITOR) 20 MG tablet TAKE 1 TABLET BY ORAL ROUTE EVERY DAY  . Blood Glucose Monitoring Suppl (ONE TOUCH ULTRA 2) w/Device KIT Use as directed to check blood sugars 2 times per day dx:e11.22  . carboxymethylcellulose (REFRESH PLUS) 0.5 % SOLN Place 1 drop into both eyes daily.  [name] glucose blood (ONE TOUCH ULTRA TEST) test strip Use as instructed to check blood sugars 2 times per day dx: e11.22  . meclizine (ANTIVERT) 25 MG tablet TAKE 1 TABLET (25 MG TOTAL) BY MOUTH  3 (THREE) TIMES DAILY AS NEEDED FOR DIZZINESS.  . methimazole (TAPAZOLE) 5 MG tablet Take 1 tablet (5 mg total) by mouth daily.  [name] omeprazole (PRILOSEC) 20 MG capsule TAKE ONE CAPSULE BY MOUTH BEFORE A MEAL  . OneTouch Delica Lancets 74Y MISC Use as directed to check blood sugars 2 times per day dx: e11.22  . pneumococcal 13-valent conjugate vaccine (PREVNAR 13) SUSP injection Inject 0.5 mLs into the muscle tomorrow at 10 am for 1 dose.  . propranolol ER (INDERAL LA) 120 MG 24 hr capsule TAKE 1 CAPSULE BY MOUTH EVERY DAY  . Semaglutide,0.25 or  0.5MG/DOS, (OZEMPIC, 0.25 OR 0.5 MG/DOSE,) 2 MG/1.5ML SOPN Inject 0.5 mg into the skin once a week. (Patient taking differently: Inject 0.5 mg into the skin once a week. Pt states she takes 0.25 on Wednesday and 0.5 on Sunday)  . triamcinolone cream (KENALOG) 0.1 % 1 application daily as needed.  . triamterene-hydrochlorothiazide (MAXZIDE-25) 37.5-25 MG tablet TAKE 1 TABLET BY MOUTH EVERY DAY IN THE MORNING   No facility-administered encounter medications on file as of 03/17/2019.    Activities of Daily Living In your present state of health, do you have any difficulty performing the following activities: 03/17/2019 05/27/2018  Hearing? N N  Vision? N N  Difficulty concentrating or making decisions? N N  Walking or climbing stairs? Y Y  Comment - uses cane  Dressing or bathing? N N  Doing errands, shopping? N N  Preparing Food and eating ? N N  Using the Toilet? N N  In the past six months, have you accidently leaked urine? N N  Do you have problems with loss of bowel control? N N  Managing your Medications? N N  Managing your Finances? N N  Housekeeping or managing your Housekeeping? N N  Some recent data might be hidden    Patient Care Team: [name], MD as PCP - General (Internal Medicine) Blanca Friend, Royce Macadamia, [facility] (Pharmacist) Rex Kras Claudette Stapler, RN as Case Manager Daneen Schick as Social Worker    Assessment:   This is a routine wellness  examination for Gizzelle.  Exercise Activities and Dietary recommendations Current Exercise Habits: Home exercise routine, Type of exercise: walking;strength training/weights, Time (Minutes): 15, Frequency (Times/Week): 2, Weekly Exercise (Minutes/Week): 30  Goals    . Assist with Chronic Care Management and Care Coordination needs     Current Barriers:  [name] Knowledge Barriers related to resources and support available to address needs related to Chronic Care Management and cost assistance for Ozempic  Case Manager Clinical Goal(s):  [name] Over the next 30 days, patient will work with the CCM team to address needs related to Chronic disease management and medication management including financial assistance for cost of Ozempic  Interventions:  . Collaborated with BSW and initiated plan of care to address needs related to Chronic Care Management and cost assistance for Ozempic  Patient Self Care Activities:  . Self administers medications as prescribed . Attends all scheduled provider appointments . Calls pharmacy for medication refills . Calls provider office for new concerns or questions  Initial goal documentation     . I need financial assistance for my medications (pt-stated)     Current Barriers:  . Financial Barriers: patient has [employer] and reports copay for Ozempic (>$500/month) is cost prohibitive at this time  Pharmacist Clinical Goal(s):  [name] Over the next 60 days, patient will work with PharmD and providers to relieve medication access concerns (goal updated due to delays in processing with DSS due to [place])  Interventions: Call completed with patient on 01/20/19 . Comprehensive medication review completed; medication list updated in electronic medical record.  [name] by [employer]: Patient meets income/NO out of pocket spend criteria for this medication's patient assistance program. Reviewed application process. Patient provided proof of income, out of pocket spend  report, and will sign application. Will collaborate with primary care provider, Dr. [name] for their portion of application. Once completed, will submit to [employer] patient assistance program. . Patient completed application in office.  Application faxed to company for approval.  Company denied  application due to income, however patient to apply for LIS/extrahelp through medicare.  Assisted patient with application online per verbal permission.  Patient verbalized understanding.  [name] Consulted with [employer] to assist patient in applying for [employer].  Application submitted week of 11/15/2018.  Can take up to 45 days for reply.  Patient denied for Medicaid/extra help.  Application prepared and submitted for 2021 on 01/20/2019 . Achieving this goal will allow patient to continue to control her diabetes.  CCM SW Interventions: Completed 01/13/2019  . Outbound call to the patient to review steps once letter received from [facility] . Determined the patient has received letter in the mail . Advised the patient to bring the letter to her primary providers office over the next week to drop off for embedded PharmD . Discussed importance of PharmD receiving letter to assist with patient assistance application for the patients DM medication . Collaboration with provider office informing of patients plan to drop letter off this afternoon . Collaboration with PharmD to communicate above interventions  Patient Self Care Activities:  . Patient will provide necessary portions of application   Please see past updates related to this goal by clicking on the "Past Updates" button in the selected goal       . I would like to optimize medication management of my chronic conditions. (pt-stated)     Current Barriers:  . Diabetes: T2DM; most recent A1c 8.6% on 12/14/18 (A1c was 8.9% on 09/01/2018) . Current antihyperglycemic regimen: Ozempic 0.98m weekly-pt reports increasing ozempic to  0.739mweekly per MD orders (goal to increase to 70m62meekly)--tolerating well, denies side effects . Denies hypoglycemic symptoms; denies hyperglycemic symptoms . Current exercise: light walking, ADLs . Current blood glucose readings: FBG 160-180 . Current diet: patient admits to eating desserts and increased carbs around the holidays, counseled on diet/exercise/lifestyle . Cardiovascular riske reduction: o Current hypertensive regimen: amlodipine, triamterene/HCTZ, propranolol  o Current hyperlipidemia regimen:  atorvastatin 68m67mILY #90DS filled 11/16/18  Pharmacist Clinical Goal(s):  . OvMarland Kitchenr the next 90 days, patient with work with PharmD and primary care provider to address needs related to optimized medication management of chronic conditions (diabetes, hypertension, hyperlipidemia)  Interventions: . Comprehensive medication review performed, medication list updated in electronic medical record.  Reviewed medication fill history via insurance claims data confirming patient appears compliant with having her medications filled on time as prescribed by provider. . Reviewed & discussed the following diabetes-related information with patient: o Continue checking blood sugars as directed o Follow ADA recommended "diabetes-friendly" diet  (reviewed healthy snack/food options) o Discussed GLP-1 injection technique; Patient uses One Touch Ultra glucometer o Reviewed medication purpose/side effects  Patient Self Care Activities:  . Patient will check blood glucose daily , document, and provide at future appointments . Patient will focus on medication adherence by continuing to take medications as prescribed. . Patient will take medications as prescribed . Patient will contact provider with any episodes of hypoglycemia . Patient will report any questions or concerns to provider   Please see past updates related to this goal by clicking on the "Past Updates" button in the selected goal        . Weight (lb) < 200 lb (90.7 kg)     03/17/2019, wants to get down to 220 pounds       Fall Risk Fall Risk  03/17/2019 09/01/2018 07/06/2018 05/27/2018 02/01/2018  Falls in the past year? 0 0 0 0 0  Risk for fall due to :  Medication side effect - - Medication side effect;Impaired balance/gait -  Follow up Education provided;Falls prevention discussed - - Falls prevention discussed;Education provided -   Is the patient's home free of loose throw rugs in walkways, pet beds, electrical cords, etc?   yes      Grab bars in the bathroom? no      Handrails on the stairs?   n/a      Adequate lighting?   yes  Timed Get Up and Go performed: n/a  Depression Screen PHQ 2/9 Scores 03/17/2019 05/27/2018 02/01/2018 12/29/2017  PHQ - 2 Score 0 0 0 0  PHQ- 9 Score 0 0 - -     Cognitive Function     6CIT Screen 03/17/2019 05/27/2018  What Year? 0 points 0 points  What month? 0 points 0 points  What time? 0 points 0 points  Count back from 20 0 points 0 points  Months in reverse 0 points 2 points  Repeat phrase 2 points 2 points  Total Score 2 4    Immunization History  Administered Date(s) Administered  . Influenza, High Dose Seasonal PF 11/12/2017, 11/09/2018  . Pneumococcal Polysaccharide-23 11/29/2015    Qualifies for Shingles Vaccine? yes  Screening Tests Health Maintenance  Topic Date Due  . PNA vac Low Risk Adult (2 of 2 - PCV13) 11/28/2016  . OPHTHALMOLOGY EXAM  07/17/2018  . URINE MICROALBUMIN  05/27/2019  . HEMOGLOBIN A1C  06/13/2019  . FOOT EXAM  12/14/2019  . MAMMOGRAM  04/18/2020  . COLONOSCOPY  05/02/2024  . TETANUS/TDAP  08/12/2027  . INFLUENZA VACCINE  Completed  . DEXA SCAN  Completed  . Hepatitis C Screening  Completed    Cancer Screenings: Lung: Low Dose CT Chest recommended if Age 26-80 years, 30 pack-year currently smoking OR have quit w/in 15years. Patient does not qualify. Breast:  Up to date on Mammogram? Yes   Up to date of Bone Density/Dexa? Yes Colorectal:  up to date  Additional Screenings: : Hepatitis C Screening: 02/01/2018     Plan:    Patient wants to get down to 220 pounds.   I have personally reviewed and noted the following in the patient's chart:   . Medical and social history . Use of alcohol, tobacco or illicit drugs  . Current medications and supplements . Functional ability and status . Nutritional status . Physical activity . Advanced directives . List of other physicians . Hospitalizations, surgeries, and ER visits in previous 12 months . Vitals . Screenings to include cognitive, depression, and falls . Referrals and appointments  In addition, I have reviewed and discussed with patient certain preventive protocols, quality metrics, and best practice recommendations. A written personalized care plan for preventive services as well as general preventive health recommendations were provided to patient.     Kellie Simmering, LPN  07/13/3352

## 2019-03-17 NOTE — Patient Instructions (Signed)

## 2019-03-17 NOTE — Patient Instructions (Signed)
Teresa Stanley , Thank you for taking time to come for your Medicare Wellness Visit. I appreciate your ongoing commitment to your health goals. Please review the following plan we discussed and let me know if I can assist you in the future.   Screening recommendations/referrals: Colonoscopy: 04/2014 Mammogram: scheduled Bone Density: 09/2016 Recommended yearly ophthalmology/optometry visit for glaucoma screening and checkup Recommended yearly dental visit for hygiene and checkup  Vaccinations: Influenza vaccine: 10/2018 Pneumococcal vaccine: sent to pharmacy Tdap vaccine: 08/2017 Shingles vaccine: discussed    Advanced directives: Please bring a copy of your POA (Power of [place]) and/or Living Will to your next appointment.    Conditions/risks identified: obesity  Next appointment: 06/16/2019 at 10:45   Preventive Care 65 Years and Older, Female Preventive care refers to lifestyle choices and visits with your health care provider that can promote health and wellness. What does preventive care include?  A yearly physical exam. This is also called an annual well check.  Dental exams once or twice a year.  Routine eye exams. Ask your health care provider how often you should have your eyes checked.  Personal lifestyle choices, including:  Daily care of your teeth and gums.  Regular physical activity.  Eating a healthy diet.  Avoiding tobacco and drug use.  Limiting alcohol use.  Practicing safe sex.  Taking low-dose aspirin every day.  Taking vitamin and mineral supplements as recommended by your health care provider. What happens during an annual well check? The services and screenings done by your health care provider during your annual well check will depend on your age, overall health, lifestyle risk factors, and family history of disease. Counseling  Your health care provider may ask you questions about your:  Alcohol use.  Tobacco use.  Drug use.  Emotional  well-being.  Home and relationship well-being.  Sexual activity.  Eating habits.  History of falls.  Memory and ability to understand (cognition).  Work and work Statistician.  Reproductive health. Screening  You may have the following tests or measurements:  Height, weight, and BMI.  Blood pressure.  Lipid and cholesterol levels. These may be checked every 5 years, or more frequently if you are over 23 years old.  Skin check.  Lung cancer screening. You may have this screening every year starting at age 69 if you have a 30-pack-year history of smoking and currently smoke or have quit within the past 15 years.  Fecal occult blood test (FOBT) of the stool. You may have this test every year starting at age 6.  Flexible sigmoidoscopy or colonoscopy. You may have a sigmoidoscopy every 5 years or a colonoscopy every 10 years starting at age 35.  Hepatitis C blood test.  Hepatitis B blood test.  Sexually transmitted disease (STD) testing.  Diabetes screening. This is done by checking your blood sugar (glucose) after you have not eaten for a while (fasting). You may have this done every 1-3 years.  Bone density scan. This is done to screen for osteoporosis. You may have this done starting at age 75.  Mammogram. This may be done every 1-2 years. Talk to your health care provider about how often you should have regular mammograms. Talk with your health care provider about your test results, treatment options, and if necessary, the need for more tests. Vaccines  Your health care provider may recommend certain vaccines, such as:  Influenza vaccine. This is recommended every year.  Tetanus, diphtheria, and acellular pertussis (Tdap, Td) vaccine. You may need a  Td booster every 10 years.  Zoster vaccine. You may need this after age 75.  Pneumococcal 13-valent conjugate (PCV13) vaccine. One dose is recommended after age 65.  Pneumococcal polysaccharide (PPSV23) vaccine. One  dose is recommended after age 47. Talk to your health care provider about which screenings and vaccines you need and how often you need them. This information is not intended to replace advice given to you by your health care provider. Make sure you discuss any questions you have with your health care provider. Document Released: 02/23/2015 Document Revised: 10/17/2015 Document Reviewed: 11/28/2014 Elsevier Interactive Patient Education  2017 [place] Prevention in the Home Falls can cause injuries. They can happen to people of all ages. There are many things you can do to make your home safe and to help prevent falls. What can I do on the outside of my home?  Regularly fix the edges of walkways and driveways and fix any cracks.  Remove anything that might make you trip as you walk through a door, such as a raised step or threshold.  Trim any bushes or trees on the path to your home.  Use bright outdoor lighting.  Clear any walking paths of anything that might make someone trip, such as rocks or tools.  Regularly check to see if handrails are loose or broken. Make sure that both sides of any steps have handrails.  Any raised decks and porches should have guardrails on the edges.  Have any leaves, snow, or ice cleared regularly.  Use sand or salt on walking paths during winter.  Clean up any spills in your garage right away. This includes oil or grease spills. What can I do in the bathroom?  Use night lights.  Install grab bars by the toilet and in the tub and shower. Do not use towel bars as grab bars.  Use non-skid mats or decals in the tub or shower.  If you need to sit down in the shower, use a plastic, non-slip stool.  Keep the floor dry. Clean up any water that spills on the floor as soon as it happens.  Remove soap buildup in the tub or shower regularly.  Attach bath mats securely with double-sided non-slip rug tape.  Do not have throw rugs and other  things on the floor that can make you trip. What can I do in the bedroom?  Use night lights.  Make sure that you have a light by your bed that is easy to reach.  Do not use any sheets or blankets that are too big for your bed. They should not hang down onto the floor.  Have a firm chair that has side arms. You can use this for support while you get dressed.  Do not have throw rugs and other things on the floor that can make you trip. What can I do in the kitchen?  Clean up any spills right away.  Avoid walking on wet floors.  Keep items that you use a lot in easy-to-reach places.  If you need to reach something above you, use a strong step stool that has a grab bar.  Keep electrical cords out of the way.  Do not use floor polish or wax that makes floors slippery. If you must use wax, use non-skid floor wax.  Do not have throw rugs and other things on the floor that can make you trip. What can I do with my stairs?  Do not leave any items on the stairs.  Make sure that there are handrails on both sides of the stairs and use them. Fix handrails that are broken or loose. Make sure that handrails are as long as the stairways.  Check any carpeting to make sure that it is firmly attached to the stairs. Fix any carpet that is loose or worn.  Avoid having throw rugs at the top or bottom of the stairs. If you do have throw rugs, attach them to the floor with carpet tape.  Make sure that you have a light switch at the top of the stairs and the bottom of the stairs. If you do not have them, ask someone to add them for you. What else can I do to help prevent falls?  Wear shoes that:  Do not have high heels.  Have rubber bottoms.  Are comfortable and fit you well.  Are closed at the toe. Do not wear sandals.  If you use a stepladder:  Make sure that it is fully opened. Do not climb a closed stepladder.  Make sure that both sides of the stepladder are locked into place.  Ask  someone to hold it for you, if possible.  Clearly mark and make sure that you can see:  Any grab bars or handrails.  First and last steps.  Where the edge of each step is.  Use tools that help you move around (mobility aids) if they are needed. These include:  Canes.  Walkers.  Scooters.  Crutches.  Turn on the lights when you go into a dark area. Replace any light bulbs as soon as they burn out.  Set up your furniture so you have a clear path. Avoid moving your furniture around.  If any of your floors are uneven, fix them.  If there are any pets around you, be aware of where they are.  Review your medicines with your doctor. Some medicines can make you feel dizzy. This can increase your chance of falling. Ask your doctor what other things that you can do to help prevent falls. This information is not intended to replace advice given to you by your health care provider. Make sure you discuss any questions you have with your health care provider. Document Released: 11/23/2008 Document Revised: 07/05/2015 Document Reviewed: 03/03/2014 Elsevier Interactive Patient Education  2017 [employer].

## 2019-03-17 NOTE — Addendum Note (Signed)
Addended by: Glenna Durand E on: 03/17/2019 11:59 AM   Modules accepted: Orders

## 2019-03-17 NOTE — Progress Notes (Signed)
This visit occurred during the SARS-CoV-2 public health emergency.  Safety protocols were in place, including screening questions prior to the visit, additional usage of staff PPE, and extensive cleaning of exam room while observing appropriate contact time as indicated for disinfecting solutions.  Subjective:     Patient ID: Teresa Stanley , female    DOB: [birth year] , 74 y.o.   MRN: [id number]   Chief Complaint  Patient presents with  . Diabetes  . Hypertension    HPI  Diabetes She presents for her follow-up diabetic visit. She has type 2 diabetes mellitus. Her disease course has been stable. There are no hypoglycemic associated symptoms. Pertinent negatives for diabetes include no blurred vision and no chest pain. There are no hypoglycemic complications. Diabetic complications include nephropathy. Risk factors for coronary artery disease include diabetes mellitus, dyslipidemia, obesity, post-menopausal and sedentary lifestyle. She is compliant with treatment some of the time. Her weight is decreasing steadily. She is following a generally healthy diet. She participates in exercise intermittently. Her breakfast blood glucose is taken between 8-9 am. Her breakfast blood glucose range is generally 130-140 mg/dl. An ACE inhibitor/angiotensin II receptor blocker is being taken. Eye exam is current.  Hypertension The current episode started more than 1 year ago. The problem has been gradually improving since onset. The problem is uncontrolled. Pertinent negatives include no blurred vision, chest pain, palpitations or shortness of breath. The current treatment provides moderate improvement. Compliance problems include exercise.  Hypertensive end-organ damage includes kidney disease.     Past Medical History:  Diagnosis Date  . Arthritis   . Diabetes mellitus    takes Actos and Metformin daily and Victoza  . GERD (gastroesophageal reflux disease)    takes Omeprazole daily  . Headache(784.0)     occasionally  . Hyperlipidemia    takes Atorvastatin daily  . Hypertension    takes Lisinopril,Amlodipine,and Metoprolol daily  . Hypothyroidism   . Joint pain   . Thyroid disease    ?     Family History  Problem Relation Age of Onset  . Diabetes Mother   . Hypertension Mother   . Diabetes Father   . Asthma Father   . Hypertension Father   . Hypertension Daughter   . Hypertension Sister        2  . Diabetes Sister   . Hypertension Brother        5  . Kidney disease Brother   . Colon cancer Neg Hx   . Esophageal cancer Neg Hx   . Rectal cancer Neg Hx   . Stomach cancer Neg Hx      Current Outpatient Medications:  .  amLODipine (NORVASC) 10 MG tablet, TAKE 1 TABLET BY MOUTH EVERY DAY IN THE EVENING, Disp: 90 tablet, Rfl: 2 .  aspirin 81 MG tablet, Take 81 mg by mouth daily., Disp: , Rfl:  .  atorvastatin (LIPITOR) 20 MG tablet, TAKE 1 TABLET BY ORAL ROUTE EVERY DAY, Disp: 90 tablet, Rfl: 2 .  Blood Glucose Monitoring Suppl (ONE TOUCH ULTRA 2) w/Device KIT, Use as directed to check blood sugars 2 times per day dx:e11.22, Disp: 1 each, Rfl: 1 .  carboxymethylcellulose (REFRESH PLUS) 0.5 % SOLN, Place 1 drop into both eyes daily., Disp: , Rfl:  .  glucose blood (ONE TOUCH ULTRA TEST) test strip, Use as instructed to check blood sugars 2 times per day dx: e11.22, Disp: 300 each, Rfl: 2 .  meclizine (ANTIVERT) 25 MG tablet, TAKE 1 TABLET (  25 MG TOTAL) BY MOUTH 3 (THREE) TIMES DAILY AS NEEDED FOR DIZZINESS., Disp: 30 tablet, Rfl: 0 .  methimazole (TAPAZOLE) 5 MG tablet, Take 1 tablet (5 mg total) by mouth daily., Disp: 90 tablet, Rfl: 1 .  omeprazole (PRILOSEC) 20 MG capsule, TAKE ONE CAPSULE BY MOUTH BEFORE A MEAL, Disp: 90 capsule, Rfl: 1 .  OneTouch Delica Lancets 96Q MISC, Use as directed to check blood sugars 2 times per day dx: e11.22, Disp: 300 each, Rfl: 2 .  propranolol ER (INDERAL LA) 120 MG 24 hr capsule, TAKE 1 CAPSULE BY MOUTH EVERY DAY, Disp: 90 capsule, Rfl: 0 .   Semaglutide,0.25 or 0.5MG/DOS, (OZEMPIC, 0.25 OR 0.5 MG/DOSE,) 2 MG/1.5ML SOPN, Inject 0.5 mg into the skin once a week. (Patient taking differently: Inject 0.5 mg into the skin once a week. Pt states she takes 0.25 on Wednesday and 0.5 on Sunday), Disp: 3 pen, Rfl: 1 .  triamcinolone cream (KENALOG) 0.1 %, 1 application daily as needed., Disp: , Rfl:  .  triamterene-hydrochlorothiazide (MAXZIDE-25) 37.5-25 MG tablet, TAKE 1 TABLET BY MOUTH EVERY DAY IN THE MORNING, Disp: 90 tablet, Rfl: 1 .  pneumococcal 13-valent conjugate vaccine (PREVNAR 13) SUSP injection, Inject 0.5 mLs into the muscle tomorrow at 10 am for 1 dose., Disp: 0.5 mL, Rfl: 0   No Known Allergies   Review of Systems  Constitutional: Negative.   Eyes: Negative for blurred vision.  Respiratory: Negative.  Negative for shortness of breath.   Cardiovascular: Negative.  Negative for chest pain and palpitations.  Gastrointestinal: Negative.   Neurological: Negative.   Psychiatric/Behavioral: Negative.      Today's Vitals   03/17/19 0934  BP: 118/74  Pulse: 87  Temp: 98.7 F (37.1 C)  TempSrc: Oral  Weight: 255 lb (115.7 kg)  Height: 5' 2.2" (1.58 m)  PainSc: 0-No pain   Body mass index is 46.34 kg/m.   Objective:  Physical Exam Vitals and nursing note reviewed.  Constitutional:      Appearance: Normal appearance. She is obese.  HENT:     Head: Normocephalic and atraumatic.  Cardiovascular:     Rate and Rhythm: Normal rate and regular rhythm.     Heart sounds: Normal heart sounds.  Pulmonary:     Effort: Pulmonary effort is normal.     Breath sounds: Normal breath sounds.  Skin:    General: Skin is warm.  Neurological:     General: No focal deficit present.     Mental Status: She is alert.  Psychiatric:        Mood and Affect: Mood normal.        Behavior: Behavior normal.         Assessment And Plan:     1. Diabetes mellitus with stage 3 chronic kidney disease (HCC)  Chronic, controlled. She will  continue with current meds. She is encouraged to increase her daily activity. She is advised to perform chair exercises while watching TV.   - CMP14+EGFR - Lipid panel - Hemoglobin A1c  2. Hypertensive nephropathy  Chronic, well controlled. She will continue with current meds. She is encouraged to avoid adding salt to her foods.  3. Class 3 severe obesity due to excess calories with serious comorbidity and body mass index (BMI) of 45.0 to 49.9 in adult [facility])  Wt Readings from Last 3 Encounters:  03/17/19 255 lb (115.7 kg)  03/17/19 255 lb (115.7 kg)  12/14/18 260 lb 6.4 oz (118.1 kg)   She was congratulated on  her 5 pound weight loss since Nov 2020. She is encouraged to continue with her lifestyle changes. Importance of exercise was stressed to the patient.   Maximino Greenland, MD    THE PATIENT IS ENCOURAGED TO PRACTICE SOCIAL DISTANCING DUE TO THE COVID-19 PANDEMIC.

## 2019-03-18 LAB — LIPID PANEL
Chol/HDL Ratio: 2.7 ratio (ref 0.0–4.4)
Cholesterol, Total: 162 mg/dL (ref 100–199)
HDL: 60 mg/dL (ref >39)
LDL Chol Calc (NIH): 80 mg/dL (ref 0–99)
Triglycerides: 124 mg/dL (ref 0–149)
VLDL Cholesterol Cal: 22 mg/dL (ref 5–40)

## 2019-03-18 LAB — CMP14+EGFR
ALT: 10 IU/L (ref 0–32)
AST: 15 IU/L (ref 0–40)
Albumin/Globulin Ratio: 1.5 (ref 1.2–2.2)
Albumin: 4.3 g/dL (ref 3.7–4.7)
Alkaline Phosphatase: 175 IU/L — ABNORMAL HIGH (ref 39–117)
BUN/Creatinine Ratio: 16 (ref 12–28)
BUN: 26 mg/dL (ref 8–27)
Bilirubin Total: 0.4 mg/dL (ref 0.0–1.2)
CO2: 20 mmol/L (ref 20–29)
Calcium: 9.8 mg/dL (ref 8.7–10.3)
Chloride: 102 mmol/L (ref 96–106)
Creatinine, Ser: 1.58 mg/dL — ABNORMAL HIGH (ref 0.57–1.00)
GFR calc Af Amer: 37 mL/min/{1.73_m2} — ABNORMAL LOW (ref >59)
GFR calc non Af Amer: 32 mL/min/{1.73_m2} — ABNORMAL LOW (ref >59)
Globulin, Total: 2.8 g/dL (ref 1.5–4.5)
Glucose: 221 mg/dL — ABNORMAL HIGH (ref 65–99)
Potassium: 5.9 mmol/L — ABNORMAL HIGH (ref 3.5–5.2)
Sodium: 139 mmol/L (ref 134–144)
Total Protein: 7.1 g/dL (ref 6.0–8.5)

## 2019-03-18 LAB — HEMOGLOBIN A1C
Est. average glucose Bld gHb Est-mCnc: 232 mg/dL
Hgb A1c MFr Bld: 9.7 % — ABNORMAL HIGH (ref 4.8–5.6)

## 2019-03-25 ENCOUNTER — Ambulatory Visit: Payer: Self-pay

## 2019-03-25 ENCOUNTER — Telehealth: Payer: Self-pay

## 2019-03-25 DIAGNOSIS — N183 Chronic kidney disease, stage 3 unspecified: Secondary | ICD-10-CM

## 2019-03-25 DIAGNOSIS — E1122 Type 2 diabetes mellitus with diabetic chronic kidney disease: Secondary | ICD-10-CM

## 2019-03-28 ENCOUNTER — Ambulatory Visit (INDEPENDENT_AMBULATORY_CARE_PROVIDER_SITE_OTHER): Payer: Medicare Other | Admitting: Pharmacist

## 2019-03-28 ENCOUNTER — Telehealth: Payer: Self-pay

## 2019-03-28 ENCOUNTER — Other Ambulatory Visit: Payer: Self-pay

## 2019-03-28 DIAGNOSIS — E1122 Type 2 diabetes mellitus with diabetic chronic kidney disease: Secondary | ICD-10-CM

## 2019-03-28 DIAGNOSIS — N183 Chronic kidney disease, stage 3 unspecified: Secondary | ICD-10-CM | POA: Diagnosis not present

## 2019-03-28 NOTE — Chronic Care Management (AMB) (Signed)
  Chronic Care Management   Outreach Note  03/28/2019 Name: Teresa Stanley MRN: [id number] DOB: 1946/02/10  Referred by: [name], MD Reason for referral : Chronic Care Management (RQ initial call)   An unsuccessful telephone outreach was attempted today. The patient was referred to the case management team for assistance with care management and care coordination.   Follow Up Plan: Telephone follow up appointment with care management team member scheduled for: 04/25/19  Barb Merino, RN, BSN, CCM Care Management Coordinator [facility] Management/[facility]  Direct Phone: [phone]

## 2019-03-29 NOTE — Patient Instructions (Signed)
Visit Information  Goals Addressed            This Visit's Progress     Patient Stated   . I would like to optimize medication management of my chronic conditions. (pt-stated)       Current Barriers:  . Diabetes: T2DM; most recent A1c 9.7% on 03/17/19--increased since NOV 2020 . Current antihyperglycemic regimen: Ozempic 0.5mg  weekly-pt reports increasing ozempic to 0.75mg  weekly per MD orders (goal to increase to 1mg  weekly)--tolerating well, denies side effects o Adding tresiba 6 units qHS o Provided counseling and diabetes education o Will add tresiba to patient's assistance . Denies hypoglycemic symptoms; denies hyperglycemic symptoms . Current exercise: light walking, ADLs . Current blood glucose readings: FBG 190-200 . Current diet: patient admits to eating desserts and increased carbs around the holidays, counseled on diet/exercise/lifestyle . Cardiovascular riske reduction: o Current hypertensive regimen: amlodipine, triamterene/HCTZ, propranolol  o Current hyperlipidemia regimen:  atorvastatin 20mg  DAILY #90DS filled 02/14/19  Pharmacist Clinical Goal(s):  [name] Over the next 90 days, patient with work with PharmD and primary care provider to address needs related to optimized medication management of chronic conditions (diabetes, hypertension, hyperlipidemia)  Interventions: . Comprehensive medication review performed, medication list updated in electronic medical record.  Reviewed medication fill history via insurance claims data confirming patient appears compliant with having her medications filled on time as prescribed by provider. . Reviewed & discussed the following diabetes-related information with patient: o Continue checking blood sugars as directed o Follow ADA recommended "diabetes-friendly" diet  (reviewed healthy snack/food options) o Discussed GLP-1/insulin injection technique; Patient uses One Touch Ultra glucometer o Reviewed medication purpose/side  effects  Patient Self Care Activities:  . Patient will check blood glucose daily , document, and provide at future appointments . Patient will focus on medication adherence by continuing to take medications as prescribed. . Patient will take medications as prescribed . Patient will contact provider with any episodes of hypoglycemia . Patient will report any questions or concerns to provider   Please see past updates related to this goal by clicking on the "Past Updates" button in the selected goal          The patient verbalized understanding of instructions provided today and declined a print copy of patient instruction materials.   The care management team will reach out to the patient again over the next 5 days.   SIGNATURE Regina Eck, PharmD, BCPS Clinical Pharmacist, [facility] [place]: [phone]

## 2019-03-29 NOTE — Progress Notes (Signed)
Chronic Care Management    Visit Note  03/28/2019 Name: Teresa Stanley MRN: [id number] DOB: March 21, 1945  Referred by: [name], MD Reason for referral : Chronic Care Management (Diabetes)   Teresa Stanley is a 74 y.o. year old female who is a primary care patient of [name], MD. The CCM team was consulted for assistance with chronic disease management and care coordination needs related to DMII  Review of patient status, including review of consultants reports, relevant laboratory and other test results, and collaboration with appropriate care team members and the patient's provider was performed as part of comprehensive patient evaluation and provision of chronic care management services.    SDOH (Social Determinants of Health) screening performed today: Financial Strain . See Care Plan for related entries.   Medications: Outpatient Encounter Medications as of 03/28/2019  Medication Sig  . insulin degludec (TRESIBA FLEXTOUCH) 100 UNIT/ML SOPN FlexTouch Pen Inject 6 Units into the skin at bedtime.  [name] amLODipine (NORVASC) 10 MG tablet TAKE 1 TABLET BY MOUTH EVERY DAY IN THE EVENING  . aspirin 81 MG tablet Take 81 mg by mouth daily.  [name] atorvastatin (LIPITOR) 20 MG tablet TAKE 1 TABLET BY ORAL ROUTE EVERY DAY  . Blood Glucose Monitoring Suppl (ONE TOUCH ULTRA 2) w/Device KIT Use as directed to check blood sugars 2 times per day dx:e11.22  . carboxymethylcellulose (REFRESH PLUS) 0.5 % SOLN Place 1 drop into both eyes daily.  [name] glucose blood (ONE TOUCH ULTRA TEST) test strip Use as instructed to check blood sugars 2 times per day dx: e11.22  . meclizine (ANTIVERT) 25 MG tablet TAKE 1 TABLET (25 MG TOTAL) BY MOUTH 3 (THREE) TIMES DAILY AS NEEDED FOR DIZZINESS.  . methimazole (TAPAZOLE) 5 MG tablet Take 1 tablet (5 mg total) by mouth daily.  [name] omeprazole (PRILOSEC) 20 MG capsule TAKE ONE CAPSULE BY MOUTH BEFORE A MEAL  . OneTouch Delica Lancets 47M MISC Use as directed to check blood  sugars 2 times per day dx: e11.22  . propranolol ER (INDERAL LA) 120 MG 24 hr capsule TAKE 1 CAPSULE BY MOUTH EVERY DAY  . Semaglutide,0.25 or 0.5MG/DOS, (OZEMPIC, 0.25 OR 0.5 MG/DOSE,) 2 MG/1.5ML SOPN Inject 0.5 mg into the skin once a week. (Patient taking differently: Inject 0.5 mg into the skin once a week. Pt states she takes 0.25 on Wednesday and 0.5 on Sunday)  . triamcinolone cream (KENALOG) 0.1 % 1 application daily as needed.  . triamterene-hydrochlorothiazide (MAXZIDE-25) 37.5-25 MG tablet TAKE 1 TABLET BY MOUTH EVERY DAY IN THE MORNING   No facility-administered encounter medications on file as of 03/28/2019.     Objective:   Goals Addressed            This Visit's Progress     Patient Stated   . I would like to optimize medication management of my chronic conditions. (pt-stated)       Current Barriers:  . Diabetes: T2DM; most recent A1c 9.7% on 03/17/19--increased since NOV 2020 . Current antihyperglycemic regimen: Ozempic 0.4109m weekly-pt reports increasing ozempic to 0.710mweekly per MD orders (goal to increase to 109m24meekly)--tolerating well, denies side effects o Adding tresiba 6 units qHS o Provided counseling and diabetes education o Will add tresiba to patient's assistance . Denies hypoglycemic symptoms; denies hyperglycemic symptoms . Current exercise: light walking, ADLs . Current blood glucose readings: FBG 190-200 . Current diet: patient admits to eating desserts and increased carbs around the holidays, counseled on diet/exercise/lifestyle . Cardiovascular riske reduction:  o Current hypertensive regimen: amlodipine, triamterene/HCTZ, propranolol  o Current hyperlipidemia regimen:  atorvastatin 84m DAILY #90DS filled 02/14/19  Pharmacist Clinical Goal(s):  .Marland KitchenOver the next 90 days, patient with work with PharmD and primary care provider to address needs related to optimized medication management of chronic conditions (diabetes, hypertension,  hyperlipidemia)  Interventions: Clinic visit completed with patient on 03/28/19 . Comprehensive medication review performed, medication list updated in electronic medical record.  Reviewed medication fill history via insurance claims data confirming patient appears compliant with having her medications filled on time as prescribed by provider. . Reviewed & discussed the following diabetes-related information with patient: o Continue checking blood sugars as directed o Follow ADA recommended "diabetes-friendly" diet  (reviewed healthy snack/food options) o Discussed GLP-1/insulin injection technique; Patient uses One Touch Ultra glucometer o Reviewed medication purpose/side effects  Patient Self Care Activities:  . Patient will check blood glucose daily , document, and provide at future appointments . Patient will focus on medication adherence by continuing to take medications as prescribed. . Patient will take medications as prescribed . Patient will contact provider with any episodes of hypoglycemia . Patient will report any questions or concerns to provider   Please see past updates related to this goal by clicking on the "Past Updates" button in the selected goal            Plan:   The care management team will reach out to the patient again over the next 5 days.   Provider Signature JRegina Eck PharmD, BCPS Clinical Pharmacist, [facility] [place] [phone]

## 2019-03-30 ENCOUNTER — Other Ambulatory Visit: Payer: Self-pay | Admitting: Pharmacy Technician

## 2019-03-30 NOTE — Patient Outreach (Signed)
[facility]) Care Management  03/30/2019  Teresa Stanley May 21, 1945 OK:[id number]   Received provider portion(s) of patient assistance application(s) for [place]. Faxed completed application and required documents into [employer].  Will follow up with company(ies) in 10-14 business days to check status of application(s).  Maud Deed Chana Bode [place] Certified Pharmacy Technician [place] Management Direct Dial:[phone]

## 2019-04-01 DIAGNOSIS — Z794 Long term (current) use of insulin: Secondary | ICD-10-CM | POA: Diagnosis not present

## 2019-04-01 DIAGNOSIS — E119 Type 2 diabetes mellitus without complications: Secondary | ICD-10-CM | POA: Diagnosis not present

## 2019-04-01 DIAGNOSIS — H35363 Drusen (degenerative) of macula, bilateral: Secondary | ICD-10-CM | POA: Diagnosis not present

## 2019-04-01 DIAGNOSIS — H353130 Nonexudative age-related macular degeneration, bilateral, stage unspecified: Secondary | ICD-10-CM | POA: Diagnosis not present

## 2019-04-01 DIAGNOSIS — H04123 Dry eye syndrome of bilateral lacrimal glands: Secondary | ICD-10-CM | POA: Diagnosis not present

## 2019-04-01 LAB — HM DIABETES EYE EXAM

## 2019-04-04 ENCOUNTER — Other Ambulatory Visit: Payer: Self-pay | Admitting: Internal Medicine

## 2019-04-05 ENCOUNTER — Encounter: Payer: Self-pay | Admitting: Internal Medicine

## 2019-04-14 ENCOUNTER — Other Ambulatory Visit: Payer: Self-pay | Admitting: Pharmacy Technician

## 2019-04-14 NOTE — Patient Outreach (Signed)
[facility]) Care Management  04/14/2019  Teresa Stanley Sep 02, 1945 [id number]   Follow up call placed to [employer] regarding patient assistance application(s) for Teresa Stanley confirms patient has been approved as of 2/17 until 02/10/2020. Medication to arrive at the office in 10-14 business days.  Follow up:  Will route note to [place] to inform and will remove myself from care team.  Maud Deed. Chana Bode [place] Certified Pharmacy Technician [place] Management Direct Dial:[phone]

## 2019-04-18 ENCOUNTER — Ambulatory Visit: Payer: Self-pay | Admitting: Pharmacist

## 2019-04-18 ENCOUNTER — Ambulatory Visit (INDEPENDENT_AMBULATORY_CARE_PROVIDER_SITE_OTHER): Payer: Medicare Other | Admitting: Pharmacist

## 2019-04-18 DIAGNOSIS — N183 Chronic kidney disease, stage 3 unspecified: Secondary | ICD-10-CM

## 2019-04-18 DIAGNOSIS — E1122 Type 2 diabetes mellitus with diabetic chronic kidney disease: Secondary | ICD-10-CM

## 2019-04-18 NOTE — Progress Notes (Signed)
Chronic Care Management   Visit Note  04/18/2019 Name: Teresa Stanley MRN: [id number] DOB: Dec 22, 1945  Referred by: [name], MD Reason for referral : Chronic Care Management and Diabetes   Teresa Stanley is a 74 y.o. year old female who is a primary care patient of [name], MD. The CCM team was consulted for assistance with chronic disease management and care coordination needs related to DMII  Review of patient status, including review of consultants reports, relevant laboratory and other test results, and collaboration with appropriate care team members and the patient's provider was performed as part of comprehensive patient evaluation and provision of chronic care management services.    SDOH (Social Determinants of Health) assessments performed: No See Care Plan activities for detailed interventions related to SDOH)     Medications: Outpatient Encounter Medications as of 04/18/2019  Medication Sig  . amLODipine (NORVASC) 10 MG tablet TAKE 1 TABLET BY MOUTH EVERY DAY IN THE EVENING  . aspirin 81 MG tablet Take 81 mg by mouth daily.  [name] atorvastatin (LIPITOR) 20 MG tablet TAKE 1 TABLET BY ORAL ROUTE EVERY DAY  . Blood Glucose Monitoring Suppl (ONE TOUCH ULTRA 2) w/Device KIT Use as directed to check blood sugars 2 times per day dx:e11.22  . carboxymethylcellulose (REFRESH PLUS) 0.5 % SOLN Place 1 drop into both eyes daily.  [name] glucose blood (ONE TOUCH ULTRA TEST) test strip Use as instructed to check blood sugars 2 times per day dx: e11.22  . insulin degludec (TRESIBA FLEXTOUCH) 100 UNIT/ML SOPN FlexTouch Pen Inject 6 Units into the skin at bedtime.  . meclizine (ANTIVERT) 25 MG tablet TAKE 1 TABLET (25 MG TOTAL) BY MOUTH 3 (THREE) TIMES DAILY AS NEEDED FOR DIZZINESS.  . methimazole (TAPAZOLE) 5 MG tablet TAKE 1 TABLET BY MOUTH EVERY DAY  . omeprazole (PRILOSEC) 20 MG capsule TAKE ONE CAPSULE BY MOUTH BEFORE A MEAL  . OneTouch Delica Lancets 62I MISC Use as directed to check  blood sugars 2 times per day dx: e11.22  . propranolol ER (INDERAL LA) 120 MG 24 hr capsule TAKE 1 CAPSULE BY MOUTH EVERY DAY  . Semaglutide,0.25 or 0.5MG/DOS, (OZEMPIC, 0.25 OR 0.5 MG/DOSE,) 2 MG/1.5ML SOPN Inject 0.5 mg into the skin once a week. (Patient taking differently: Inject 0.5 mg into the skin once a week. Pt states she takes 0.25 on Wednesday and 0.5 on Sunday)  . triamcinolone cream (KENALOG) 0.1 % 1 application daily as needed.  . triamterene-hydrochlorothiazide (MAXZIDE-25) 37.5-25 MG tablet TAKE 1 TABLET BY MOUTH EVERY DAY IN THE MORNING   No facility-administered encounter medications on file as of 04/18/2019.     Objective:   Goals Addressed            This Visit's Progress     Patient Stated   . I would like to optimize medication management of my chronic conditions. (pt-stated)       Current Barriers:  . Diabetes: T2DM; most recent A1c 9.7% on 03/17/19--increased since NOV 2020 . Current antihyperglycemic regimen: Ozempic 0.78m weekly-pt reports increasing ozempic to 0.743mweekly per MD orders (goal to increase to 32m15meekly)--tolerating well, denies side effects o Adding tresiba 6 units qHS o Provided counseling and diabetes education o Will add tresiba to patient's assistance - Patient approved for Tresiba--medication to be delivered to PCP office on 10-14 business days.  Left patient a VM explaining shipment.  . Denies hypoglycemic symptoms; denies hyperglycemic symptoms . Current exercise: light walking, ADLs . Current blood glucose  readings: FBG 190-200 (patient to call back with most recent readings) . Current diet: patient admits to eating desserts and increased carbs around the holidays, counseled on diet/exercise/lifestyle . Cardiovascular riske reduction: o Current hypertensive regimen: amlodipine, triamterene/HCTZ, propranolol  o Current hyperlipidemia regimen:  atorvastatin 76m DAILY #90DS filled 02/14/19  Pharmacist Clinical Goal(s):  .Marland KitchenOver the next  90 days, patient with work with PharmD and primary care provider to address needs related to optimized medication management of chronic conditions (diabetes, hypertension, hyperlipidemia)  Interventions: Clinic visit completed with patient on 04/18/19 . Comprehensive medication review performed, medication list updated in electronic medical record.  Reviewed medication fill history via insurance claims data confirming patient appears compliant with having her medications filled on time as prescribed by provider. . Reviewed & discussed the following diabetes-related information with patient: o Continue checking blood sugars as directed o Follow ADA recommended "diabetes-friendly" diet  (reviewed healthy snack/food options) o Discussed GLP-1/insulin injection technique; Patient uses One Touch Ultra glucometer o Reviewed medication purpose/side effects  Patient Self Care Activities:  . Patient will check blood glucose daily , document, and provide at future appointments . Patient will focus on medication adherence by continuing to take medications as prescribed. . Patient will take medications as prescribed . Patient will contact provider with any episodes of hypoglycemia . Patient will report any questions or concerns to provider   Please see past updates related to this goal by clicking on the "Past Updates" button in the selected goal           Plan:   The care management team will reach out to the patient again over the next 7 days.   Provider Signature JRegina Eck PharmD, BCPS Clinical Pharmacist, [facility] [place] [phone]

## 2019-04-18 NOTE — Patient Instructions (Signed)
Visit Information  Goals Addressed            This Visit's Progress     Patient Stated   . I would like to optimize medication management of my chronic conditions. (pt-stated)       Current Barriers:  . Diabetes: T2DM; most recent A1c 9.7% on 03/17/19--increased since NOV 2020 . Current antihyperglycemic regimen: Ozempic 0.5mg  weekly-pt reports increasing ozempic to 0.75mg  weekly per MD orders (goal to increase to 1mg  weekly)--tolerating well, denies side effects o Adding tresiba 6 units qHS o Provided counseling and diabetes education o Will add tresiba to patient's assistance - Patient approved for Tresiba--medication to be delivered to PCP office on 10-14 business days.  Left patient a VM explaining shipment.  . Denies hypoglycemic symptoms; denies hyperglycemic symptoms . Current exercise: light walking, ADLs . Current blood glucose readings: FBG 190-200 . Current diet: patient admits to eating desserts and increased carbs around the holidays, counseled on diet/exercise/lifestyle . Cardiovascular riske reduction: o Current hypertensive regimen: amlodipine, triamterene/HCTZ, propranolol  o Current hyperlipidemia regimen:  atorvastatin 20mg  DAILY #90DS filled 02/14/19  Pharmacist Clinical Goal(s):  [name] Over the next 90 days, patient with work with PharmD and primary care provider to address needs related to optimized medication management of chronic conditions (diabetes, hypertension, hyperlipidemia)  Interventions: Clinic visit completed with patient on 04/18/19 . Comprehensive medication review performed, medication list updated in electronic medical record.  Reviewed medication fill history via insurance claims data confirming patient appears compliant with having her medications filled on time as prescribed by provider. . Reviewed & discussed the following diabetes-related information with patient: o Continue checking blood sugars as directed o Follow ADA recommended  "diabetes-friendly" diet  (reviewed healthy snack/food options) o Discussed GLP-1/insulin injection technique; Patient uses One Touch Ultra glucometer o Reviewed medication purpose/side effects  Patient Self Care Activities:  . Patient will check blood glucose daily , document, and provide at future appointments . Patient will focus on medication adherence by continuing to take medications as prescribed. . Patient will take medications as prescribed . Patient will contact provider with any episodes of hypoglycemia . Patient will report any questions or concerns to provider   Please see past updates related to this goal by clicking on the "Past Updates" button in the selected goal          The patient verbalized understanding of instructions provided today and declined a print copy of patient instruction materials.   The care management team will reach out to the patient again over the next 7 days.   SIGNATURE Regina Eck, PharmD, BCPS Clinical Pharmacist, [facility] [place]: [phone]

## 2019-04-19 NOTE — Progress Notes (Signed)
Chronic Care Management   Visit Note  04/18/2019 Name: Teresa Stanley MRN: [id number] DOB: 01/16/1946  Referred by: [name], MD Reason for referral : Chronic Care Management and Diabetes   Teresa Stanley is a 74 y.o. year old female who is a primary care patient of [name], MD. The CCM team was consulted for assistance with chronic disease management and care coordination needs related to HLD and DMII  Review of patient status, including review of consultants reports, relevant laboratory and other test results, and collaboration with appropriate care team members and the patient's provider was performed as part of comprehensive patient evaluation and provision of chronic care management services.    SDOH (Social Determinants of Health) assessments performed: Yes See Care Plan activities for detailed interventions related to SDOH)     Medications: Outpatient Encounter Medications as of 04/18/2019  Medication Sig  . amLODipine (NORVASC) 10 MG tablet TAKE 1 TABLET BY MOUTH EVERY DAY IN THE EVENING  . aspirin 81 MG tablet Take 81 mg by mouth daily.  [name] atorvastatin (LIPITOR) 20 MG tablet TAKE 1 TABLET BY ORAL ROUTE EVERY DAY  . Blood Glucose Monitoring Suppl (ONE TOUCH ULTRA 2) w/Device KIT Use as directed to check blood sugars 2 times per day dx:e11.22  . carboxymethylcellulose (REFRESH PLUS) 0.5 % SOLN Place 1 drop into both eyes daily.  [name] glucose blood (ONE TOUCH ULTRA TEST) test strip Use as instructed to check blood sugars 2 times per day dx: e11.22  . insulin degludec (TRESIBA FLEXTOUCH) 100 UNIT/ML SOPN FlexTouch Pen Inject 6 Units into the skin at bedtime.  . meclizine (ANTIVERT) 25 MG tablet TAKE 1 TABLET (25 MG TOTAL) BY MOUTH 3 (THREE) TIMES DAILY AS NEEDED FOR DIZZINESS.  . methimazole (TAPAZOLE) 5 MG tablet TAKE 1 TABLET BY MOUTH EVERY DAY  . omeprazole (PRILOSEC) 20 MG capsule TAKE ONE CAPSULE BY MOUTH BEFORE A MEAL  . OneTouch Delica Lancets 42H MISC Use as directed  to check blood sugars 2 times per day dx: e11.22  . propranolol ER (INDERAL LA) 120 MG 24 hr capsule TAKE 1 CAPSULE BY MOUTH EVERY DAY  . Semaglutide,0.25 or 0.5MG/DOS, (OZEMPIC, 0.25 OR 0.5 MG/DOSE,) 2 MG/1.5ML SOPN Inject 0.5 mg into the skin once a week. (Patient taking differently: Inject 0.5 mg into the skin once a week. Pt states she takes 0.25 on Wednesday and 0.5 on Sunday)  . triamcinolone cream (KENALOG) 0.1 % 1 application daily as needed.  . triamterene-hydrochlorothiazide (MAXZIDE-25) 37.5-25 MG tablet TAKE 1 TABLET BY MOUTH EVERY DAY IN THE MORNING   No facility-administered encounter medications on file as of 04/18/2019.     Objective:   Goals Addressed            This Visit's Progress     Patient Stated   . I would like to optimize medication management of my chronic conditions. (pt-stated)       Current Barriers:  . Diabetes: T2DM; most recent A1c 9.7% on 03/17/19--increased since NOV 2020 . Current antihyperglycemic regimen: Ozempic 0.43m weekly-pt reports increasing ozempic to 0.754mweekly per MD orders (goal to increase to 80m70meekly)--tolerating well, denies side effects o 04/18/19-->Increasing Tresiba to 8 units qHS (MD in agreement with this plan-sample left in fridge for patient until patient assistance shipment arrives) o Provided counseling and diabetes education o Will add tresiba to patient's assistance - Patient approved for Tresiba--medication to be delivered to PCP office on 10-14 business days.  Left patient a VM explaining  shipment.  . Denies hypoglycemic symptoms; denies hyperglycemic symptoms . Current exercise: light walking, ADLs . Current blood glucose readings: FBG 150-170 (improving) . Current diet: patient admits to eating desserts and increased carbs around the holidays, counseled on diet/exercise/lifestyle . Cardiovascular riske reduction: o Current hypertensive regimen: amlodipine, triamterene/HCTZ, propranolol  o Current hyperlipidemia regimen:   atorvastatin 14m DAILY #90DS filled 02/14/19  Pharmacist Clinical Goal(s):  .Marland KitchenOver the next 90 days, patient with work with PharmD and primary care provider to address needs related to optimized medication management of chronic conditions (diabetes, hypertension, hyperlipidemia)  Interventions: Clinic visit completed with patient on 04/18/19 . Comprehensive medication review performed, medication list updated in electronic medical record.  Reviewed medication fill history via insurance claims data confirming patient appears compliant with having her medications filled on time as prescribed by provider. . Reviewed & discussed the following diabetes-related information with patient: o Continue checking blood sugars as directed o Follow ADA recommended "diabetes-friendly" diet  (reviewed healthy snack/food options) o Discussed GLP-1/insulin injection technique; Patient uses One Touch Ultra glucometer o Reviewed medication purpose/side effects  Patient Self Care Activities:  . Patient will check blood glucose daily , document, and provide at future appointments . Patient will focus on medication adherence by continuing to take medications as prescribed. . Patient will take medications as prescribed . Patient will contact provider with any episodes of hypoglycemia . Patient will report any questions or concerns to provider   Please see past updates related to this goal by clicking on the "Past Updates" button in the selected goal           Plan:   The care management team will reach out to the patient again over the next 60 days.   Provider Signature JRegina Eck PharmD, BCPS Clinical Pharmacist, [facility] [place] [phone]

## 2019-04-19 NOTE — Patient Instructions (Signed)
Visit Information  Goals Addressed            This Visit's Progress     Patient Stated   . I would like to optimize medication management of my chronic conditions. (pt-stated)       Current Barriers:  . Diabetes: T2DM; most recent A1c 9.7% on 03/17/19--increased since NOV 2020 . Current antihyperglycemic regimen: Ozempic 0.5mg  weekly-pt reports increasing ozempic to 0.75mg  weekly per MD orders (goal to increase to 1mg  weekly)--tolerating well, denies side effects o 04/18/19-->Increasing Tresiba to 8 units qHS (MD in agreement with this plan-sample left in fridge for patient until patient assistance shipment arrives) o Provided counseling and diabetes education o Will add tresiba to patient's assistance - Patient approved for Tresiba--medication to be delivered to PCP office on 10-14 business days.  Left patient a VM explaining shipment.  . Denies hypoglycemic symptoms; denies hyperglycemic symptoms . Current exercise: light walking, ADLs . Current blood glucose readings: FBG 150-170 (improving) . Current diet: patient admits to eating desserts and increased carbs around the holidays, counseled on diet/exercise/lifestyle . Cardiovascular riske reduction: o Current hypertensive regimen: amlodipine, triamterene/HCTZ, propranolol  o Current hyperlipidemia regimen:  atorvastatin 20mg  DAILY #90DS filled 02/14/19  Pharmacist Clinical Goal(s):  [name] Over the next 90 days, patient with work with PharmD and primary care provider to address needs related to optimized medication management of chronic conditions (diabetes, hypertension, hyperlipidemia)  Interventions: Clinic visit completed with patient on 04/18/19 . Comprehensive medication review performed, medication list updated in electronic medical record.  Reviewed medication fill history via insurance claims data confirming patient appears compliant with having her medications filled on time as prescribed by provider. . Reviewed & discussed the  following diabetes-related information with patient: o Continue checking blood sugars as directed o Follow ADA recommended "diabetes-friendly" diet  (reviewed healthy snack/food options) o Discussed GLP-1/insulin injection technique; Patient uses One Touch Ultra glucometer o Reviewed medication purpose/side effects  Patient Self Care Activities:  . Patient will check blood glucose daily , document, and provide at future appointments . Patient will focus on medication adherence by continuing to take medications as prescribed. . Patient will take medications as prescribed . Patient will contact provider with any episodes of hypoglycemia . Patient will report any questions or concerns to provider   Please see past updates related to this goal by clicking on the "Past Updates" button in the selected goal          The patient verbalized understanding of instructions provided today and declined a print copy of patient instruction materials.   The care management team will reach out to the patient again over the next 30 days.   SIGNATURE Regina Eck, PharmD, BCPS Clinical Pharmacist, [facility] [place]: [phone]

## 2019-04-20 ENCOUNTER — Other Ambulatory Visit: Payer: Self-pay

## 2019-04-20 ENCOUNTER — Ambulatory Visit
Admission: RE | Admit: 2019-04-20 | Discharge: 2019-04-20 | Disposition: A | Discharge status: Home / Self Care | Payer: Medicare Other | Source: Ambulatory Visit | Attending: Internal Medicine | Admitting: Internal Medicine

## 2019-04-20 ENCOUNTER — Other Ambulatory Visit: Payer: Self-pay | Admitting: Internal Medicine

## 2019-04-20 DIAGNOSIS — R921 Mammographic calcification found on diagnostic imaging of breast: Secondary | ICD-10-CM | POA: Diagnosis not present

## 2019-04-20 DIAGNOSIS — Z853 Personal history of malignant neoplasm of breast: Secondary | ICD-10-CM

## 2019-04-25 ENCOUNTER — Ambulatory Visit: Payer: Self-pay

## 2019-04-25 ENCOUNTER — Other Ambulatory Visit: Payer: Self-pay

## 2019-04-25 ENCOUNTER — Telehealth: Payer: Self-pay

## 2019-04-25 DIAGNOSIS — N183 Chronic kidney disease, stage 3 unspecified: Secondary | ICD-10-CM | POA: Diagnosis not present

## 2019-04-25 DIAGNOSIS — E1122 Type 2 diabetes mellitus with diabetic chronic kidney disease: Secondary | ICD-10-CM

## 2019-04-27 NOTE — Patient Instructions (Signed)
Visit Information  Goals Addressed      Patient Stated   . "I would like to know more about my kidney disease" (pt-stated)       CARE PLAN ENTRY (see longtitudinal plan of care for additional care plan information)  Current Barriers:  [name] Knowledge Deficits related to disease process and Self Health management of CKD stage III . Chronic Disease Management support and education needs related to DM, CKD   Nurse Case Manager Clinical Goal(s):  [name] Over the next 90 days, patient will work with the CCM team and PCP to address needs related to disease education and support for improved self Health management of CKD . Over the next 90 days, patient will verbalize basic understanding of Chronic Kidney disease process and self health management plan as evidenced by patient will be able to maintain a GFR of 30-59  CCM RN CM Interventions:  04/26/19 call completed with patient  . Evaluation of current treatment plan related to CKD and patient's adherence to plan as established by provider. . Provided education to patient re: stages of CKD and patient's current GFR of 37; educated on signs/symptoms and ways to prevent further damage to kidneys . Discussed plans with patient for ongoing care management follow up and provided patient with direct contact information for care management team . Provided patient with printed educational materials related to Stages of CKD   Patient Self Care Activities:  . Self administers medications as prescribed . Attends all scheduled provider appointments . Calls pharmacy for medication refills . Performs ADL's independently . Performs IADL's independently . Calls provider office for new concerns or questions  Initial goal documentation     . "I would like to lower my A1C" (pt-stated)       CARE PLAN ENTRY (see longtitudinal plan of care for additional care plan information)  Current Barriers:  [name] Knowledge Deficits related to disease process and Self Health  management of type 2 DM   . Chronic Disease Management support and education needs related to DM, CKD stage 3   Nurse Case Manager Clinical Goal(s):  [name] Over the next 90 days, patient will work with CCM RN CM and PCP to address needs related to disease education and support to improve patient's ability to improve Self Health management of DM  . Over the next 90 days, patient will lower her A1C from 9.7, <7.0  CCM RN CM Interventions:  04/26/19 call completed with patient  . Evaluation of current treatment plan related to DM and patient's adherence to plan as established by provider. . Provided education to patient re: current A1C has increased to 9.7 obtained on 2/4; educated on target A1C <7.0; educated on potential complications for uncontrolled diabetes; educated on best ways to manage DM and lower A1C; by adhering to diabetic friendly diet, implementing exercise in daily routine, 150 minutes weekly; taking diabetic medications exactly as prescribed . Reviewed medications with patient and discussed indication, dosage and frequency of diabetic medications as prescribed; pt reports adherence and denies noted SE; patient is working with embedded Pharm D for financial assistance of drug cost . Discussed plans with patient for ongoing care management follow up and provided patient with direct contact information for care management team . Provided patient with printed educational materials related to Diabetes Management with Meal Planning; Diabetes Zone Safety Tool; Carb Counting; Carb Choices; BP log . Advised patient, providing education and rationale, to check cbg daily before meals and record, calling the CCM team and  or PCP for findings outside established parameters.    Patient Self Care Activities:  . Self administers medications as prescribed . Attends all scheduled provider appointments . Calls pharmacy for medication refills . Performs ADL's independently . Performs IADL's  independently . Calls provider office for new concerns or questions  Initial goal documentation        Other   . COMPLETED: Assist with Chronic Care Management and Care Coordination needs       Current Barriers:  [name] Knowledge Barriers related to resources and support available to address needs related to Chronic Care Management and cost assistance for Ozempic  Case Manager Clinical Goal(s):  [name] Over the next 30 days, patient will work with the CCM team to address needs related to Chronic disease management and medication management including financial assistance for cost of Ozempic  Interventions:  . Collaborated with BSW and initiated plan of care to address needs related to Chronic Care Management and cost assistance for Ozempic  Patient Self Care Activities:  . Self administers medications as prescribed . Attends all scheduled provider appointments . Calls pharmacy for medication refills . Calls provider office for new concerns or questions  Initial goal documentation        Patient verbalizes understanding of instructions provided today.   Telephone follow up appointment with care management team member scheduled for: 05/24/19  Barb Merino, RN, BSN, CCM Care Management Coordinator [place] [facility]  Direct Phone: [phone]

## 2019-04-27 NOTE — Chronic Care Management (AMB) (Signed)
Chronic Care Management   Initial Visit Note  04/26/2019 Name: Teresa Stanley MRN: [id number] DOB: [birth year]  Referred by: [name], MD Reason for referral : Chronic Care Management (RQ #2 Initial Call - DM/CKD)   Teresa Stanley is a 74 y.o. year old female who is a primary care patient of [name], MD. The CCM team was consulted for assistance with chronic disease management and care coordination needs related to DMII and CKD Stage 3 b  Review of patient status, including review of consultants reports, relevant laboratory and other test results, and collaboration with appropriate care team members and the patient's provider was performed as part of comprehensive patient evaluation and provision of chronic care management services.    SDOH (Social Determinants of Health) assessments performed: no See Care Plan activities for detailed interventions related to [place] initial CCM RN CM outbound call to patient to assess for CCM needs.     Medications: Outpatient Encounter Medications as of 04/25/2019  Medication Sig  . Semaglutide,0.25 or 0.5MG/DOS, (OZEMPIC, 0.25 OR 0.5 MG/DOSE,) 2 MG/1.5ML SOPN Inject 0.5 mg into the skin once a week. (Patient taking differently: Inject 0.5 mg into the skin once a week. Pt states she takes 0.25 on Wednesday and 0.5 on Sunday)  . amLODipine (NORVASC) 10 MG tablet TAKE 1 TABLET BY MOUTH EVERY DAY IN THE EVENING  . aspirin 81 MG tablet Take 81 mg by mouth daily.  [name] atorvastatin (LIPITOR) 20 MG tablet TAKE 1 TABLET BY ORAL ROUTE EVERY DAY  . Blood Glucose Monitoring Suppl (ONE TOUCH ULTRA 2) w/Device KIT Use as directed to check blood sugars 2 times per day dx:e11.22  . carboxymethylcellulose (REFRESH PLUS) 0.5 % SOLN Place 1 drop into both eyes daily.  [name] glucose blood (ONE TOUCH ULTRA TEST) test strip Use as instructed to check blood sugars 2 times per day dx: e11.22  . insulin degludec (TRESIBA FLEXTOUCH) 100 UNIT/ML SOPN FlexTouch Pen  Inject 8 Units into the skin at bedtime.   . meclizine (ANTIVERT) 25 MG tablet TAKE 1 TABLET (25 MG TOTAL) BY MOUTH 3 (THREE) TIMES DAILY AS NEEDED FOR DIZZINESS.  . methimazole (TAPAZOLE) 5 MG tablet TAKE 1 TABLET BY MOUTH EVERY DAY  . omeprazole (PRILOSEC) 20 MG capsule TAKE ONE CAPSULE BY MOUTH BEFORE A MEAL  . OneTouch Delica Lancets 78G MISC Use as directed to check blood sugars 2 times per day dx: e11.22  . propranolol ER (INDERAL LA) 120 MG 24 hr capsule TAKE 1 CAPSULE BY MOUTH EVERY DAY  . triamcinolone cream (KENALOG) 0.1 % 1 application daily as needed.  . triamterene-hydrochlorothiazide (MAXZIDE-25) 37.5-25 MG tablet TAKE 1 TABLET BY MOUTH EVERY DAY IN THE MORNING   No facility-administered encounter medications on file as of 04/25/2019.     Objective:  Lab Results  Component Value Date   HGBA1C 9.7 (H) 03/17/2019   HGBA1C 8.6 (H) 12/14/2018   HGBA1C 8.9 (H) 09/01/2018   Lab Results  Component Value Date   MICROALBUR 150 03/17/2019   LDLCALC 80 03/17/2019   CREATININE 1.58 (H) 03/17/2019   BP Readings from Last 3 Encounters:  03/17/19 118/74  03/17/19 118/74  02/16/19 126/61    Goals Addressed            This Visit's Progress     Patient Stated   . "I would like to know more about my kidney disease" (pt-stated)       CARE PLAN ENTRY (see longtitudinal plan of  care for additional care plan information)  Current Barriers:  [name] Knowledge Deficits related to disease process and Self Health management of CKD stage III . Chronic Disease Management support and education needs related to DM, CKD   Nurse Case Manager Clinical Goal(s):  [name] Over the next 90 days, patient will work with the CCM team and PCP to address needs related to disease education and support for improved self Health management of CKD . Over the next 90 days, patient will verbalize basic understanding of Chronic Kidney disease process and self health management plan as evidenced by patient will be  able to maintain a GFR of 30-59  CCM RN CM Interventions:  04/26/19 call completed with patient  . Evaluation of current treatment plan related to CKD and patient's adherence to plan as established by provider. . Provided education to patient re: stages of CKD and patient's current GFR of 37; educated on signs/symptoms and ways to prevent further damage to kidneys . Discussed plans with patient for ongoing care management follow up and provided patient with direct contact information for care management team . Provided patient with printed educational materials related to Stages of CKD   Patient Self Care Activities:  . Self administers medications as prescribed . Attends all scheduled provider appointments . Calls pharmacy for medication refills . Performs ADL's independently . Performs IADL's independently . Calls provider office for new concerns or questions  Initial goal documentation     . "I would like to lower my A1C" (pt-stated)       CARE PLAN ENTRY (see longtitudinal plan of care for additional care plan information)  Current Barriers:  [name] Knowledge Deficits related to disease process and Self Health management of type 2 DM   . Chronic Disease Management support and education needs related to DM, CKD stage 3   Nurse Case Manager Clinical Goal(s):  [name] Over the next 90 days, patient will work with CCM RN CM and PCP to address needs related to disease education and support to improve patient's ability to improve Self Health management of DM  . Over the next 90 days, patient will lower her A1C from 9.7, <7.0  CCM RN CM Interventions:  04/26/19 call completed with patient  . Evaluation of current treatment plan related to DM and patient's adherence to plan as established by provider. . Provided education to patient re: current A1C has increased to 9.7 obtained on 2/4; educated on target A1C <7.0; educated on potential complications for uncontrolled diabetes; educated on best  ways to manage DM and lower A1C; by adhering to diabetic friendly diet, implementing exercise in daily routine, 150 minutes weekly; taking diabetic medications exactly as prescribed . Reviewed medications with patient and discussed indication, dosage and frequency of diabetic medications as prescribed; pt reports adherence and denies noted SE; patient is working with embedded Pharm D for financial assistance of drug cost . Discussed plans with patient for ongoing care management follow up and provided patient with direct contact information for care management team . Provided patient with printed educational materials related to Diabetes Management with Meal Planning; Diabetes Zone Safety Tool; Carb Counting; Carb Choices; BP log . Advised patient, providing education and rationale, to check cbg daily before meals and record, calling the CCM team and or PCP for findings outside established parameters.    Patient Self Care Activities:  . Self administers medications as prescribed . Attends all scheduled provider appointments . Calls pharmacy for medication refills . Performs ADL's independently .  Performs IADL's independently . Calls provider office for new concerns or questions  Initial goal documentation        Other   . COMPLETED: Assist with Chronic Care Management and Care Coordination needs       Current Barriers:  [name] Knowledge Barriers related to resources and support available to address needs related to Chronic Care Management and cost assistance for Ozempic  Case Manager Clinical Goal(s):  [name] Over the next 30 days, patient will work with the CCM team to address needs related to Chronic disease management and medication management including financial assistance for cost of Ozempic  Interventions:  . Collaborated with BSW and initiated plan of care to address needs related to Chronic Care Management and cost assistance for Ozempic  Patient Self Care Activities:  . Self administers  medications as prescribed . Attends all scheduled provider appointments . Calls pharmacy for medication refills . Calls provider office for new concerns or questions  Initial goal documentation        Plan:   Telephone follow up appointment with care management team member scheduled for:05/24/19  Barb Merino, RN, BSN, CCM  Care Management Coordinator [place] [facility]  Direct Phone: [phone]

## 2019-05-04 ENCOUNTER — Ambulatory Visit: Payer: Self-pay | Admitting: Pharmacist

## 2019-05-04 DIAGNOSIS — E1122 Type 2 diabetes mellitus with diabetic chronic kidney disease: Secondary | ICD-10-CM | POA: Diagnosis not present

## 2019-05-04 DIAGNOSIS — N183 Chronic kidney disease, stage 3 unspecified: Secondary | ICD-10-CM | POA: Diagnosis not present

## 2019-05-04 NOTE — Patient Instructions (Signed)
Visit Information  Goals Addressed            This Visit's Progress     Patient Stated   . I would like to optimize medication management of my chronic conditions. (pt-stated)       Current Barriers:  . Diabetes: T2DM; most recent A1c 9.7% on 03/17/19--increased since NOV 2020 . Current antihyperglycemic regimen: Ozempic 0.5mg  weekly-pt reports increasing ozempic to 0.75mg  weekly per MD orders (goal to increase to 1mg  weekly)--tolerating well, denies side effects o 05/04/19-->Increasing Tresiba to 10 units qHS (MD in agreement with this plan-sample left in fridge for patient until patient assistance shipment arrives) o Provided counseling and diabetes education o Will add tresiba to patient's assistance - Patient approved for Tresiba/ozempic--will call in refills to [employer] patient assistance program, medication to be delivered to PCP office on 10-14 business days.   - Sample provided for patient for tresiba while waiting on refills to ship . Denies hypoglycemic symptoms; denies hyperglycemic symptoms . Current exercise: light walking, ADLs . Current blood glucose readings: FBG 130-10 (improving)--130 this AM . Current diet: patient admits to eating desserts and increased carbs around the holidays, counseled on diet/exercise/lifestyle . Cardiovascular riske reduction: o Current hypertensive regimen: amlodipine, triamterene/HCTZ, propranolol  o Current hyperlipidemia regimen:  atorvastatin 20mg  DAILY #90DS filled 02/14/19  Pharmacist Clinical Goal(s):  [name] Over the next 90 days, patient with work with PharmD and primary care provider to address needs related to optimized medication management of chronic conditions (diabetes, hypertension, hyperlipidemia)  Interventions: . Comprehensive medication review performed, medication list updated in electronic medical record.  Reviewed medication fill history via insurance claims data confirming patient appears compliant with having her  medications filled on time as prescribed by provider. . Reviewed & discussed the following diabetes-related information with patient: o Continue checking blood sugars as directed o Follow ADA recommended "diabetes-friendly" diet  (reviewed healthy snack/food options) o Discussed GLP-1/insulin injection technique; Patient uses One Touch Ultra glucometer o Reviewed medication purpose/side effects  Patient Self Care Activities:  . Patient will check blood glucose daily , document, and provide at future appointments . Patient will focus on medication adherence by continuing to take medications as prescribed. . Patient will take medications as prescribed . Patient will contact provider with any episodes of hypoglycemia . Patient will report any questions or concerns to provider   Please see past updates related to this goal by clicking on the "Past Updates" button in the selected goal          The patient verbalized understanding of instructions provided today and declined a print copy of patient instruction materials.   SIGNATURE Regina Eck, PharmD, BCPS Clinical Pharmacist, [facility] [place]: [phone]

## 2019-05-04 NOTE — Progress Notes (Signed)
Chronic Care Management   Visit Note  05/04/2019 Name: CHAYLA SHANDS MRN: [id number] DOB: [birth year]  Referred by: [name], MD Reason for referral : Chronic Care Management and Diabetes   RICHELE STRAND is a 74 y.o. year old female who is a primary care patient of [name], MD. The CCM team was consulted for assistance with chronic disease management and care coordination needs related to DMII  Review of patient status, including review of consultants reports, relevant laboratory and other test results, and collaboration with appropriate care team members and the patient's provider was performed as part of comprehensive patient evaluation and provision of chronic care management services.    SDOH (Social Determinants of Health) assessments performed: No See Care Plan activities for detailed interventions related to SDOH     Medications: Outpatient Encounter Medications as of 05/04/2019  Medication Sig  . amLODipine (NORVASC) 10 MG tablet TAKE 1 TABLET BY MOUTH EVERY DAY IN THE EVENING  . aspirin 81 MG tablet Take 81 mg by mouth daily.  [name] atorvastatin (LIPITOR) 20 MG tablet TAKE 1 TABLET BY ORAL ROUTE EVERY DAY  . Blood Glucose Monitoring Suppl (ONE TOUCH ULTRA 2) w/Device KIT Use as directed to check blood sugars 2 times per day dx:e11.22  . carboxymethylcellulose (REFRESH PLUS) 0.5 % SOLN Place 1 drop into both eyes daily.  [name] glucose blood (ONE TOUCH ULTRA TEST) test strip Use as instructed to check blood sugars 2 times per day dx: e11.22  . insulin degludec (TRESIBA FLEXTOUCH) 100 UNIT/ML SOPN FlexTouch Pen Inject 10 Units into the skin at bedtime.   . meclizine (ANTIVERT) 25 MG tablet TAKE 1 TABLET (25 MG TOTAL) BY MOUTH 3 (THREE) TIMES DAILY AS NEEDED FOR DIZZINESS.  . methimazole (TAPAZOLE) 5 MG tablet TAKE 1 TABLET BY MOUTH EVERY DAY  . omeprazole (PRILOSEC) 20 MG capsule TAKE ONE CAPSULE BY MOUTH BEFORE A MEAL  . OneTouch Delica Lancets 41S MISC Use as directed to  check blood sugars 2 times per day dx: e11.22  . propranolol ER (INDERAL LA) 120 MG 24 hr capsule TAKE 1 CAPSULE BY MOUTH EVERY DAY  . Semaglutide,0.25 or 0.5MG/DOS, (OZEMPIC, 0.25 OR 0.5 MG/DOSE,) 2 MG/1.5ML SOPN Inject 0.5 mg into the skin once a week. (Patient taking differently: Inject 0.5 mg into the skin once a week. Pt states she takes 0.25 on Wednesday and 0.5 on Sunday)  . triamcinolone cream (KENALOG) 0.1 % 1 application daily as needed.  . triamterene-hydrochlorothiazide (MAXZIDE-25) 37.5-25 MG tablet TAKE 1 TABLET BY MOUTH EVERY DAY IN THE MORNING   No facility-administered encounter medications on file as of 05/04/2019.     Objective:   Goals Addressed            This Visit's Progress     Patient Stated   . I would like to optimize medication management of my chronic conditions. (pt-stated)       Current Barriers:  . Diabetes: T2DM; most recent A1c 9.7% on 03/17/19--increased since NOV 2020 . Current antihyperglycemic regimen: Ozempic 0.2m weekly-pt reports increasing ozempic to 0.774mweekly per MD orders (goal to increase to 42m105meekly)--tolerating well, denies side effects o 05/04/19-->Increasing Tresiba to 10 units qHS (MD in agreement with this plan-sample left in fridge for patient until patient assistance shipment arrives) o Provided counseling and diabetes education o Will add tresiba to patient's assistance - Patient approved for Tresiba/ozempic--will call in refills to [employer] patient assistance program, medication to be delivered to PCP office on  10-14 business days.   - Sample provided for patient for tresiba while waiting on refills to ship . Denies hypoglycemic symptoms; denies hyperglycemic symptoms . Current exercise: light walking, ADLs . Current blood glucose readings: FBG 130-10 (improving)--130 this AM . Current diet: patient admits to eating desserts and increased carbs around the holidays, counseled on diet/exercise/lifestyle . Cardiovascular riske  reduction: o Current hypertensive regimen: amlodipine, triamterene/HCTZ, propranolol  o Current hyperlipidemia regimen:  atorvastatin 41m DAILY #90DS filled 02/14/19  Pharmacist Clinical Goal(s):  .Marland KitchenOver the next 90 days, patient with work with PharmD and primary care provider to address needs related to optimized medication management of chronic conditions (diabetes, hypertension, hyperlipidemia)  Interventions: . Comprehensive medication review performed, medication list updated in electronic medical record.  Reviewed medication fill history via insurance claims data confirming patient appears compliant with having her medications filled on time as prescribed by provider. . Reviewed & discussed the following diabetes-related information with patient: o Continue checking blood sugars as directed o Follow ADA recommended "diabetes-friendly" diet  (reviewed healthy snack/food options) o Discussed GLP-1/insulin injection technique; Patient uses One Touch Ultra glucometer o Reviewed medication purpose/side effects  Patient Self Care Activities:  . Patient will check blood glucose daily , document, and provide at future appointments . Patient will focus on medication adherence by continuing to take medications as prescribed. . Patient will take medications as prescribed . Patient will contact provider with any episodes of hypoglycemia . Patient will report any questions or concerns to provider   Please see past updates related to this goal by clicking on the "Past Updates" button in the selected goal           Provider Signature  JRegina Eck PharmD, BMiddleburgPharmacist, [facility] [place] [phone]

## 2019-05-24 ENCOUNTER — Telehealth: Payer: Self-pay

## 2019-05-31 ENCOUNTER — Encounter: Payer: Medicare Other | Admitting: Internal Medicine

## 2019-05-31 ENCOUNTER — Ambulatory Visit: Payer: Medicare Other

## 2019-06-08 ENCOUNTER — Ambulatory Visit (INDEPENDENT_AMBULATORY_CARE_PROVIDER_SITE_OTHER): Payer: Medicare Other

## 2019-06-08 ENCOUNTER — Telehealth: Payer: Self-pay

## 2019-06-08 ENCOUNTER — Other Ambulatory Visit: Payer: Self-pay

## 2019-06-08 DIAGNOSIS — N183 Chronic kidney disease, stage 3 unspecified: Secondary | ICD-10-CM | POA: Diagnosis not present

## 2019-06-08 DIAGNOSIS — E1122 Type 2 diabetes mellitus with diabetic chronic kidney disease: Secondary | ICD-10-CM

## 2019-06-10 NOTE — Chronic Care Management (AMB) (Signed)
Chronic Care Management   Follow Up Note   06/08/2019 Name: Teresa Stanley MRN: [id number] DOB: 31-Jul-1945  Referred by: [name], MD Reason for referral : Chronic Care Management (Inbound call from patient )   Teresa Stanley is a 74 y.o. year old female who is a primary care patient of [name], MD. The CCM team was consulted for assistance with chronic disease management and care coordination needs.    Review of patient status, including review of consultants reports, relevant laboratory and other test results, and collaboration with appropriate care team members and the patient's provider was performed as part of comprehensive patient evaluation and provision of chronic care management services.    SDOH (Social Determinants of Health) assessments performed: Yes - no acute challenges identified at this time.  See Care Plan activities for detailed interventions related to SDOH)   Return call to patient to f/u on refill request for Ozempic.     Outpatient Encounter Medications as of 06/08/2019  Medication Sig  . amLODipine (NORVASC) 10 MG tablet TAKE 1 TABLET BY MOUTH EVERY DAY IN THE EVENING  . aspirin 81 MG tablet Take 81 mg by mouth daily.  [name] atorvastatin (LIPITOR) 20 MG tablet TAKE 1 TABLET BY ORAL ROUTE EVERY DAY  . Blood Glucose Monitoring Suppl (ONE TOUCH ULTRA 2) w/Device KIT Use as directed to check blood sugars 2 times per day dx:e11.22  . carboxymethylcellulose (REFRESH PLUS) 0.5 % SOLN Place 1 drop into both eyes daily.  [name] glucose blood (ONE TOUCH ULTRA TEST) test strip Use as instructed to check blood sugars 2 times per day dx: e11.22  . insulin degludec (TRESIBA FLEXTOUCH) 100 UNIT/ML SOPN FlexTouch Pen Inject 10 Units into the skin at bedtime.   . meclizine (ANTIVERT) 25 MG tablet TAKE 1 TABLET (25 MG TOTAL) BY MOUTH 3 (THREE) TIMES DAILY AS NEEDED FOR DIZZINESS.  . methimazole (TAPAZOLE) 5 MG tablet TAKE 1 TABLET BY MOUTH EVERY DAY  . omeprazole (PRILOSEC) 20  MG capsule TAKE ONE CAPSULE BY MOUTH BEFORE A MEAL  . OneTouch Delica Lancets 56C MISC Use as directed to check blood sugars 2 times per day dx: e11.22  . propranolol ER (INDERAL LA) 120 MG 24 hr capsule TAKE 1 CAPSULE BY MOUTH EVERY DAY  . Semaglutide,0.25 or 0.5MG/DOS, (OZEMPIC, 0.25 OR 0.5 MG/DOSE,) 2 MG/1.5ML SOPN Inject 0.5 mg into the skin once a week. (Patient taking differently: Inject 0.5 mg into the skin once a week. Pt states she takes 0.25 on Wednesday and 0.5 on Sunday)  . triamcinolone cream (KENALOG) 0.1 % 1 application daily as needed.  . triamterene-hydrochlorothiazide (MAXZIDE-25) 37.5-25 MG tablet TAKE 1 TABLET BY MOUTH EVERY DAY IN THE MORNING   No facility-administered encounter medications on file as of 06/08/2019.     Objective:  Lab Results  Component Value Date   HGBA1C 9.7 (H) 03/17/2019   HGBA1C 8.6 (H) 12/14/2018   HGBA1C 8.9 (H) 09/01/2018   Lab Results  Component Value Date   MICROALBUR 150 03/17/2019   LDLCALC 80 03/17/2019   CREATININE 1.58 (H) 03/17/2019   BP Readings from Last 3 Encounters:  03/17/19 118/74  03/17/19 118/74  02/16/19 126/61    Goals Addressed            This Visit's Progress     Patient Stated   . "I would like to lower my A1C" (pt-stated)       CARE PLAN ENTRY (see longtitudinal plan of care for additional care  plan information)  Current Barriers:  [name] Knowledge Deficits related to disease process and Self Health management of type 2 DM   . Chronic Disease Management support and education needs related to DM, CKD stage 3   Nurse Case Manager Clinical Goal(s):  [name] Over the next 90 days, patient will work with CCM RN CM and PCP to address needs related to disease education and support to improve patient's ability to improve Self Health management of DM  . Over the next 90 days, patient will lower her A1C from 9.7, <7.0  CCM RN CM Interventions:  06/08/19 call completed with patient  . Evaluation of current treatment plan  related to DM and patient's adherence to plan as established by provider. . Provided education to patient re: current A1C has increased to 9.7 obtained on 2/4; educated on target A1C <7.0; educated on potential complications for uncontrolled diabetes; educated on best ways to manage DM and lower A1C; by adhering to diabetic friendly diet, implementing exercise in daily routine, 150 minutes weekly; taking diabetic medications exactly as prescribed: Determined patient is running within target ranges with FBS at this time  . Reviewed medications with patient and discussed indication, dosage and frequency of diabetic medications as prescribed; pt reports adherence and denies noted SE; Determined patient is taking Ozempic 0.25 mg on Wednesday and 0.5 mg on Sunday; Determined patient needs a refill due to being low on supply: Determined PCP office has requested refill through [employer]; Determined patient will pick up empic sample from PCP office this afternoon until her refill is ready  . Discussed plans with patient for ongoing care management follow up and provided patient with direct contact information for care management team . Advised patient, providing education and rationale, to check cbg daily before meals and record, calling the CCM team and or PCP for findings outside established parameters.    Patient Self Care Activities:  . Self administers medications as prescribed . Attends all scheduled provider appointments . Calls pharmacy for medication refills . Performs ADL's independently . Performs IADL's independently . Calls provider office for new concerns or questions  Please see past updates related to this goal by clicking on the "Past Updates" button in the selected goal       . COMPLETED: I would like to optimize medication management of my chronic conditions. (pt-stated)       Current Barriers:  . Diabetes: T2DM; most recent A1c 9.7% on 03/17/19--increased since NOV 2020 . Current  antihyperglycemic regimen: Ozempic 0.29m weekly-pt reports increasing ozempic to 0.740mweekly per MD orders (goal to increase to 87m487meekly)--tolerating well, denies side effects o 05/04/19-->Increasing Tresiba to 10 units qHS (MD in agreement with this plan-sample left in fridge for patient until patient assistance shipment arrives) o Provided counseling and diabetes education o Will add tresiba to patient's assistance - Patient approved for Tresiba/ozempic--will call in refills to [employer] patient assistance program, medication to be delivered to PCP office on 10-14 business days.   - Sample provided for patient for tresiba while waiting on refills to ship . Denies hypoglycemic symptoms; denies hyperglycemic symptoms . Current exercise: light walking, ADLs . Current blood glucose readings: FBG 130-10 (improving)--130 this AM . Current diet: patient admits to eating desserts and increased carbs around the holidays, counseled on diet/exercise/lifestyle . Cardiovascular riske reduction: o Current hypertensive regimen: amlodipine, triamterene/HCTZ, propranolol  o Current hyperlipidemia regimen:  atorvastatin 61m65mILY #90DS filled 02/14/19  Pharmacist Clinical Goal(s):  . OvMarland Kitchenr the next 90  days, patient with work with PharmD and primary care provider to address needs related to optimized medication management of chronic conditions (diabetes, hypertension, hyperlipidemia)  Interventions: . Comprehensive medication review performed, medication list updated in electronic medical record.  Reviewed medication fill history via insurance claims data confirming patient appears compliant with having her medications filled on time as prescribed by provider. . Reviewed & discussed the following diabetes-related information with patient: o Continue checking blood sugars as directed o Follow ADA recommended "diabetes-friendly" diet  (reviewed healthy snack/food options) o Discussed GLP-1/insulin injection  technique; Patient uses One Touch Ultra glucometer o Reviewed medication purpose/side effects  Patient Self Care Activities:  . Patient will check blood glucose daily , document, and provide at future appointments . Patient will focus on medication adherence by continuing to take medications as prescribed. . Patient will take medications as prescribed . Patient will contact provider with any episodes of hypoglycemia . Patient will report any questions or concerns to provider   Please see past updates related to this goal by clicking on the "Past Updates" button in the selected goal        Plan:   Telephone follow up appointment with care management team member scheduled for: 06/30/19   Barb Merino, RN, BSN, CCM Care Management Coordinator [facility]  Direct Phone: [phone]

## 2019-06-10 NOTE — Patient Instructions (Addendum)
Visit Information  Goals Addressed      Patient Stated   . "I would like to lower my A1C" (pt-stated)       CARE PLAN ENTRY (see longtitudinal plan of care for additional care plan information)  Current Barriers:  [name] Knowledge Deficits related to disease process and Self Health management of type 2 DM   . Chronic Disease Management support and education needs related to DM, CKD stage 3   Nurse Case Manager Clinical Goal(s):  [name] Over the next 90 days, patient will work with CCM RN CM and PCP to address needs related to disease education and support to improve patient's ability to improve Self Health management of DM  . Over the next 90 days, patient will lower her A1C from 9.7, <7.0  CCM RN CM Interventions:  06/08/19 call completed with patient  . Evaluation of current treatment plan related to DM and patient's adherence to plan as established by provider. . Provided education to patient re: current A1C has increased to 9.7 obtained on 2/4; educated on target A1C <7.0; educated on potential complications for uncontrolled diabetes; educated on best ways to manage DM and lower A1C; by adhering to diabetic friendly diet, implementing exercise in daily routine, 150 minutes weekly; taking diabetic medications exactly as prescribed: Determined patient is running within target ranges with FBS at this time  . Reviewed medications with patient and discussed indication, dosage and frequency of diabetic medications as prescribed; pt reports adherence and denies noted SE; Determined patient is taking Ozempic 0.25 mg on Wednesday and 0.5 mg on Sunday; Determined patient needs a refill due to being low on supply: Determined PCP office has requested refill through [employer]; Determined patient will pick up empic sample from PCP office this afternoon until her refill is ready  . Discussed plans with patient for ongoing care management follow up and provided patient with direct contact information for care  management team . Advised patient, providing education and rationale, to check cbg daily before meals and record, calling the CCM team and or PCP for findings outside established parameters.    Patient Self Care Activities:  . Self administers medications as prescribed . Attends all scheduled provider appointments . Calls pharmacy for medication refills . Performs ADL's independently . Performs IADL's independently . Calls provider office for new concerns or questions  Please see past updates related to this goal by clicking on the "Past Updates" button in the selected goal       . COMPLETED: I would like to optimize medication management of my chronic conditions. (pt-stated)       Current Barriers:  . Diabetes: T2DM; most recent A1c 9.7% on 03/17/19--increased since NOV 2020 . Current antihyperglycemic regimen: Ozempic 0.5mg  weekly-pt reports increasing ozempic to 0.75mg  weekly per MD orders (goal to increase to 1mg  weekly)--tolerating well, denies side effects o 05/04/19-->Increasing Tresiba to 10 units qHS (MD in agreement with this plan-sample left in fridge for patient until patient assistance shipment arrives) o Provided counseling and diabetes education o Will add tresiba to patient's assistance - Patient approved for Tresiba/ozempic--will call in refills to [employer] patient assistance program, medication to be delivered to PCP office on 10-14 business days.   - Sample provided for patient for tresiba while waiting on refills to ship . Denies hypoglycemic symptoms; denies hyperglycemic symptoms . Current exercise: light walking, ADLs . Current blood glucose readings: FBG 130-10 (improving)--130 this AM . Current diet: patient admits to eating desserts and increased carbs  around the holidays, counseled on diet/exercise/lifestyle . Cardiovascular riske reduction: o Current hypertensive regimen: amlodipine, triamterene/HCTZ, propranolol  o Current hyperlipidemia regimen:   atorvastatin 20mg  DAILY #90DS filled 02/14/19  Pharmacist Clinical Goal(s):  [name] Over the next 90 days, patient with work with PharmD and primary care provider to address needs related to optimized medication management of chronic conditions (diabetes, hypertension, hyperlipidemia)  Interventions: . Comprehensive medication review performed, medication list updated in electronic medical record.  Reviewed medication fill history via insurance claims data confirming patient appears compliant with having her medications filled on time as prescribed by provider. . Reviewed & discussed the following diabetes-related information with patient: o Continue checking blood sugars as directed o Follow ADA recommended "diabetes-friendly" diet  (reviewed healthy snack/food options) o Discussed GLP-1/insulin injection technique; Patient uses One Touch Ultra glucometer o Reviewed medication purpose/side effects  Patient Self Care Activities:  . Patient will check blood glucose daily , document, and provide at future appointments . Patient will focus on medication adherence by continuing to take medications as prescribed. . Patient will take medications as prescribed . Patient will contact provider with any episodes of hypoglycemia . Patient will report any questions or concerns to provider   Please see past updates related to this goal by clicking on the "Past Updates" button in the selected goal          Patient verbalizes understanding of instructions provided today.   Telephone follow up appointment with care management team member scheduled for: 06/30/19  Barb Merino, RN, BSN, CCM Care Management Coordinator [facility]  Direct Phone: [phone]

## 2019-06-15 ENCOUNTER — Telehealth: Payer: Self-pay

## 2019-06-16 ENCOUNTER — Ambulatory Visit (INDEPENDENT_AMBULATORY_CARE_PROVIDER_SITE_OTHER): Payer: Medicare Other | Admitting: Internal Medicine

## 2019-06-16 ENCOUNTER — Encounter: Payer: Self-pay | Admitting: Internal Medicine

## 2019-06-16 ENCOUNTER — Other Ambulatory Visit: Payer: Self-pay

## 2019-06-16 VITALS — BP 172/96 | HR 91 | Temp 97.6°F | Ht 61.6 in | Wt 262.6 lb

## 2019-06-16 DIAGNOSIS — Z794 Long term (current) use of insulin: Secondary | ICD-10-CM | POA: Diagnosis not present

## 2019-06-16 DIAGNOSIS — E1121 Type 2 diabetes mellitus with diabetic nephropathy: Secondary | ICD-10-CM | POA: Diagnosis not present

## 2019-06-16 DIAGNOSIS — Z0000 Encounter for general adult medical examination without abnormal findings: Secondary | ICD-10-CM | POA: Diagnosis not present

## 2019-06-16 DIAGNOSIS — Z6841 Body Mass Index (BMI) 40.0 and over, adult: Secondary | ICD-10-CM

## 2019-06-16 DIAGNOSIS — E1122 Type 2 diabetes mellitus with diabetic chronic kidney disease: Secondary | ICD-10-CM

## 2019-06-16 DIAGNOSIS — N1832 Chronic kidney disease, stage 3b: Secondary | ICD-10-CM | POA: Diagnosis not present

## 2019-06-16 DIAGNOSIS — I129 Hypertensive chronic kidney disease with stage 1 through stage 4 chronic kidney disease, or unspecified chronic kidney disease: Secondary | ICD-10-CM | POA: Diagnosis not present

## 2019-06-16 DIAGNOSIS — R202 Paresthesia of skin: Secondary | ICD-10-CM | POA: Diagnosis not present

## 2019-06-16 DIAGNOSIS — Z23 Encounter for immunization: Secondary | ICD-10-CM

## 2019-06-16 DIAGNOSIS — N183 Chronic kidney disease, stage 3 unspecified: Secondary | ICD-10-CM | POA: Diagnosis not present

## 2019-06-16 DIAGNOSIS — Z7982 Long term (current) use of aspirin: Secondary | ICD-10-CM

## 2019-06-16 LAB — POCT URINALYSIS DIPSTICK
Bilirubin, UA: NEGATIVE
Blood, UA: NEGATIVE
Glucose, UA: POSITIVE — AB
Ketones, UA: NEGATIVE
Leukocytes, UA: NEGATIVE
Nitrite, UA: NEGATIVE
Protein, UA: POSITIVE — AB
Spec Grav, UA: 1.02 (ref 1.010–1.025)
Urobilinogen, UA: 0.2 E.U./dL
pH, UA: 6 (ref 5.0–8.0)

## 2019-06-16 LAB — POCT UA - MICROALBUMIN
Albumin/Creatinine Ratio, Urine, POC: 300
Creatinine, POC: 100 mg/dL
Microalbumin Ur, POC: 80 mg/L

## 2019-06-16 MED ORDER — GABAPENTIN 100 MG PO CAPS
ORAL_CAPSULE | ORAL | 2 refills | Status: DC
Start: 2019-06-16 — End: 2019-08-10

## 2019-06-16 NOTE — Patient Instructions (Signed)
Gabapentin, 100mg  nightly for nerve pain  Health Maintenance, Female Adopting a healthy lifestyle and getting preventive care are important in promoting health and wellness. Ask your health care provider about:  The right schedule for you to have regular tests and exams.  Things you can do on your own to prevent diseases and keep yourself healthy. What should I know about diet, weight, and exercise? Eat a healthy diet   Eat a diet that includes plenty of vegetables, fruits, low-fat dairy products, and lean protein.  Do not eat a lot of foods that are high in solid fats, added sugars, or sodium. Maintain a healthy weight Body mass index (BMI) is used to identify weight problems. It estimates body fat based on height and weight. Your health care provider can help determine your BMI and help you achieve or maintain a healthy weight. Get regular exercise Get regular exercise. This is one of the most important things you can do for your health. Most adults should:  Exercise for at least 150 minutes each week. The exercise should increase your heart rate and make you sweat (moderate-intensity exercise).  Do strengthening exercises at least twice a week. This is in addition to the moderate-intensity exercise.  Spend less time sitting. Even light physical activity can be beneficial. Watch cholesterol and blood lipids Have your blood tested for lipids and cholesterol at 74 years of age, then have this test every 5 years. Have your cholesterol levels checked more often if:  Your lipid or cholesterol levels are high.  You are older than 73 years of age.  You are at high risk for heart disease. What should I know about cancer screening? Depending on your health history and family history, you may need to have cancer screening at various ages. This may include screening for:  Breast cancer.  Cervical cancer.  Colorectal cancer.  Skin cancer.  Lung cancer. What should I know about  heart disease, diabetes, and high blood pressure? Blood pressure and heart disease  High blood pressure causes heart disease and increases the risk of stroke. This is more likely to develop in people who have high blood pressure readings, are of African descent, or are overweight.  Have your blood pressure checked: ? Every 3-5 years if you are 73-58 years of age. ? Every year if you are 65 years old or older. Diabetes Have regular diabetes screenings. This checks your fasting blood sugar level. Have the screening done:  Once every three years after age 52 if you are at a normal weight and have a low risk for diabetes.  More often and at a younger age if you are overweight or have a high risk for diabetes. What should I know about preventing infection? Hepatitis B If you have a higher risk for hepatitis B, you should be screened for this virus. Talk with your health care provider to find out if you are at risk for hepatitis B infection. Hepatitis C Testing is recommended for:  Everyone born from [birth year] through 1964.  Anyone with known risk factors for hepatitis C. Sexually transmitted infections (STIs)  Get screened for STIs, including gonorrhea and chlamydia, if: ? You are sexually active and are younger than 73 years of age. ? You are older than 73 years of age and your health care provider tells you that you are at risk for this type of infection. ? Your sexual activity has changed since you were last screened, and you are at increased risk for chlamydia or  gonorrhea. Ask your health care provider if you are at risk.  Ask your health care provider about whether you are at high risk for HIV. Your health care provider may recommend a prescription medicine to help prevent HIV infection. If you choose to take medicine to prevent HIV, you should first get tested for HIV. You should then be tested every 3 months for as long as you are taking the medicine. Pregnancy  If you are about to  stop having your period (premenopausal) and you may become pregnant, seek counseling before you get pregnant.  Take 400 to 800 micrograms (mcg) of folic acid every day if you become pregnant.  Ask for birth control (contraception) if you want to prevent pregnancy. Osteoporosis and menopause Osteoporosis is a disease in which the bones lose minerals and strength with aging. This can result in bone fractures. If you are 65 years old or older, or if you are at risk for osteoporosis and fractures, ask your health care provider if you should:  Be screened for bone loss.  Take a calcium or vitamin D supplement to lower your risk of fractures.  Be given hormone replacement therapy (HRT) to treat symptoms of menopause. Follow these instructions at home: Lifestyle  Do not use any products that contain nicotine or tobacco, such as cigarettes, e-cigarettes, and chewing tobacco. If you need help quitting, ask your health care provider.  Do not use street drugs.  Do not share needles.  Ask your health care provider for help if you need support or information about quitting drugs. Alcohol use  Do not drink alcohol if: ? Your health care provider tells you not to drink. ? You are pregnant, may be pregnant, or are planning to become pregnant.  If you drink alcohol: ? Limit how much you use to 0-1 drink a day. ? Limit intake if you are breastfeeding.  Be aware of how much alcohol is in your drink. In the [place] one drink equals one 12 oz bottle of beer (355 mL), one 5 oz glass of wine (148 mL), or one 1 oz glass of hard liquor (44 mL). General instructions  Schedule regular health, dental, and eye exams.  Stay current with your vaccines.  Tell your health care provider if: ? You often feel depressed. ? You have ever been abused or do not feel safe at home. Summary  Adopting a healthy lifestyle and getting preventive care are important in promoting health and wellness.  Follow your  health care provider's instructions about healthy diet, exercising, and getting tested or screened for diseases.  Follow your health care provider's instructions on monitoring your cholesterol and blood pressure. This information is not intended to replace advice given to you by your health care provider. Make sure you discuss any questions you have with your health care provider. Document Revised: 01/20/2018 Document Reviewed: 01/20/2018 Elsevier Patient Education  2020 [employer]  

## 2019-06-20 LAB — PROTEIN ELECTROPHORESIS, SERUM, WITH REFLEX
A/G Ratio: 1 (ref 0.7–1.7)
Albumin ELP: 3.7 g/dL (ref 2.9–4.4)
Alpha 1: 0.3 g/dL (ref 0.0–0.4)
Alpha 2: 1 g/dL (ref 0.4–1.0)
Beta: 1.3 g/dL (ref 0.7–1.3)
Gamma Globulin: 1.1 g/dL (ref 0.4–1.8)
Globulin, Total: 3.7 g/dL (ref 2.2–3.9)
Total Protein: 7.4 g/dL (ref 6.0–8.5)

## 2019-06-20 LAB — CBC WITH DIFFERENTIAL/PLATELET
Basophils Absolute: 0.1 10*3/uL (ref 0.0–0.2)
Basos: 1 %
EOS (ABSOLUTE): 0.2 10*3/uL (ref 0.0–0.4)
Eos: 3 %
Hematocrit: 40.9 % (ref 34.0–46.6)
Hemoglobin: 13.2 g/dL (ref 11.1–15.9)
Immature Grans (Abs): 0 10*3/uL (ref 0.0–0.1)
Immature Granulocytes: 0 %
Lymphocytes Absolute: 1.5 10*3/uL (ref 0.7–3.1)
Lymphs: 18 %
MCH: 29.8 pg (ref 26.6–33.0)
MCHC: 32.3 g/dL (ref 31.5–35.7)
MCV: 92 fL (ref 79–97)
Monocytes Absolute: 0.4 10*3/uL (ref 0.1–0.9)
Monocytes: 5 %
Neutrophils Absolute: 5.8 10*3/uL (ref 1.4–7.0)
Neutrophils: 73 %
Platelets: 271 10*3/uL (ref 150–450)
RBC: 4.43 x10E6/uL (ref 3.77–5.28)
RDW: 12.8 % (ref 11.7–15.4)
WBC: 7.9 10*3/uL (ref 3.4–10.8)

## 2019-06-20 LAB — HEMOGLOBIN A1C
Est. average glucose Bld gHb Est-mCnc: 209 mg/dL
Hgb A1c MFr Bld: 8.9 % — ABNORMAL HIGH (ref 4.8–5.6)

## 2019-06-20 LAB — BMP8+EGFR
BUN/Creatinine Ratio: 19 (ref 12–28)
BUN: 33 mg/dL — ABNORMAL HIGH (ref 8–27)
CO2: 18 mmol/L — ABNORMAL LOW (ref 20–29)
Calcium: 9.8 mg/dL (ref 8.7–10.3)
Chloride: 100 mmol/L (ref 96–106)
Creatinine, Ser: 1.7 mg/dL — ABNORMAL HIGH (ref 0.57–1.00)
GFR calc Af Amer: 34 mL/min/{1.73_m2} — ABNORMAL LOW (ref >59)
GFR calc non Af Amer: 30 mL/min/{1.73_m2} — ABNORMAL LOW (ref >59)
Glucose: 174 mg/dL — ABNORMAL HIGH (ref 65–99)
Potassium: 5.7 mmol/L — ABNORMAL HIGH (ref 3.5–5.2)
Sodium: 137 mmol/L (ref 134–144)

## 2019-06-20 LAB — PHOSPHORUS: Phosphorus: 3.7 mg/dL (ref 3.0–4.3)

## 2019-06-21 ENCOUNTER — Other Ambulatory Visit: Payer: Self-pay

## 2019-06-23 ENCOUNTER — Telehealth: Payer: Self-pay

## 2019-06-23 NOTE — Telephone Encounter (Signed)
Pt informed that her medication from the PAP is here and ready to be picked up

## 2019-06-24 NOTE — Progress Notes (Signed)
This visit occurred during the SARS-CoV-2 public health emergency.  Safety protocols were in place, including screening questions prior to the visit, additional usage of staff PPE, and extensive cleaning of exam room while observing appropriate contact time as indicated for disinfecting solutions.  Subjective:     Patient ID: Teresa Stanley , female    DOB: 09-Nov-1945 , 74 y.o.   MRN: [id number]   Chief Complaint  Patient presents with  . Annual Exam  . Diabetes  . Hypertension    HPI  She is here today for a full physical exam. She is no longer followed by GYN. She is s/p hysterectomy. She has no specific concerns at this time. She admits eating a lot of BBQ over the weekend while at her son's house in [place].   Diabetes She presents for her follow-up diabetic visit. She has type 2 diabetes mellitus. Her disease course has been improving. There are no hypoglycemic associated symptoms. Pertinent negatives for diabetes include no blurred vision, no chest pain and no weakness. There are no hypoglycemic complications. Diabetic complications include nephropathy. Risk factors for coronary artery disease include diabetes mellitus, dyslipidemia, hypertension, post-menopausal, sedentary lifestyle and obesity.  Hypertension This is a chronic problem. The current episode started more than 1 year ago. The problem has been gradually improving since onset. The problem is controlled. Pertinent negatives include no blurred vision, chest pain, palpitations or shortness of breath.     Past Medical History:  Diagnosis Date  . Arthritis   . Diabetes mellitus    takes Actos and Metformin daily and Victoza  . GERD (gastroesophageal reflux disease)    takes Omeprazole daily  . Headache(784.0)    occasionally  . Hyperlipidemia    takes Atorvastatin daily  . Hypertension    takes Lisinopril,Amlodipine,and Metoprolol daily  . Hypothyroidism   . Joint pain   . Thyroid disease    ?     Family  History  Problem Relation Age of Onset  . Diabetes Mother   . Hypertension Mother   . Diabetes Father   . Asthma Father   . Hypertension Father   . Hypertension Daughter   . Hypertension Sister        2  . Diabetes Sister   . Hypertension Brother        5  . Kidney disease Brother   . Colon cancer Neg Hx   . Esophageal cancer Neg Hx   . Rectal cancer Neg Hx   . Stomach cancer Neg Hx      Current Outpatient Medications:  .  amLODipine (NORVASC) 10 MG tablet, TAKE 1 TABLET BY MOUTH EVERY DAY IN THE EVENING, Disp: 90 tablet, Rfl: 2 .  aspirin 81 MG tablet, Take 81 mg by mouth daily., Disp: , Rfl:  .  atorvastatin (LIPITOR) 20 MG tablet, TAKE 1 TABLET BY ORAL ROUTE EVERY DAY, Disp: 90 tablet, Rfl: 2 .  Blood Glucose Monitoring Suppl (ONE TOUCH ULTRA 2) w/Device KIT, Use as directed to check blood sugars 2 times per day dx:e11.22, Disp: 1 each, Rfl: 1 .  carboxymethylcellulose (REFRESH PLUS) 0.5 % SOLN, Place 1 drop into both eyes daily., Disp: , Rfl:  .  cholecalciferol (VITAMIN D3) 25 MCG (1000 UNIT) tablet, Take 1,000 Units by mouth daily., Disp: , Rfl:  .  glucose blood (ONE TOUCH ULTRA TEST) test strip, Use as instructed to check blood sugars 2 times per day dx: e11.22, Disp: 300 each, Rfl: 2 .  insulin degludec (TRESIBA  FLEXTOUCH) 100 UNIT/ML SOPN FlexTouch Pen, Inject 12 Units into the skin at bedtime. , Disp: , Rfl:  .  meclizine (ANTIVERT) 25 MG tablet, TAKE 1 TABLET (25 MG TOTAL) BY MOUTH 3 (THREE) TIMES DAILY AS NEEDED FOR DIZZINESS., Disp: 30 tablet, Rfl: 0 .  methimazole (TAPAZOLE) 5 MG tablet, TAKE 1 TABLET BY MOUTH EVERY DAY, Disp: 90 tablet, Rfl: 1 .  omeprazole (PRILOSEC) 20 MG capsule, TAKE ONE CAPSULE BY MOUTH BEFORE A MEAL, Disp: 90 capsule, Rfl: 1 .  OneTouch Delica Lancets 95A MISC, Use as directed to check blood sugars 2 times per day dx: e11.22, Disp: 300 each, Rfl: 2 .  propranolol ER (INDERAL LA) 120 MG 24 hr capsule, TAKE 1 CAPSULE BY MOUTH EVERY DAY, Disp: 90  capsule, Rfl: 0 .  Semaglutide,0.25 or 0.5MG/DOS, (OZEMPIC, 0.25 OR 0.5 MG/DOSE,) 2 MG/1.5ML SOPN, Inject 0.5 mg into the skin once a week. (Patient taking differently: Inject 0.5 mg into the skin once a week. Pt states she takes 0.25 on Wednesday and 0.5 on Sunday), Disp: 3 pen, Rfl: 1 .  triamcinolone cream (KENALOG) 0.1 %, 1 application daily as needed., Disp: , Rfl:  .  triamterene-hydrochlorothiazide (MAXZIDE-25) 37.5-25 MG tablet, TAKE 1 TABLET BY MOUTH EVERY DAY IN THE MORNING, Disp: 90 tablet, Rfl: 1 .  gabapentin (NEURONTIN) 100 MG capsule, One capsule po qhs, Disp: 30 capsule, Rfl: 2   No Known Allergies    The patient states she uses none for birth control. Last LMP was No LMP recorded. Patient has had a hysterectomy.. Negative for Dysmenorrhea  Negative for: breast discharge, breast lump(s), breast pain and breast self exam. Associated symptoms include abnormal vaginal bleeding. Pertinent negatives include abnormal bleeding (hematology), anxiety, decreased libido, depression, difficulty falling sleep, dyspareunia, history of infertility, nocturia, sexual dysfunction, sleep disturbances, urinary incontinence, urinary urgency, vaginal discharge and vaginal itching. Diet regular.The patient states her exercise level is  intermittent.  . The patient's tobacco use is:  Social History   Tobacco Use  Smoking Status Never Smoker  Smokeless Tobacco Never Used  . She has been exposed to passive smoke. The patient's alcohol use is:  Social History   Substance and Sexual Activity  Alcohol Use No    Review of Systems  Constitutional: Negative.   HENT: Negative.   Eyes: Negative.  Negative for blurred vision.  Respiratory: Negative.  Negative for shortness of breath.   Cardiovascular: Negative.  Negative for chest pain and palpitations.  Gastrointestinal: Negative.   Endocrine: Negative.   Genitourinary: Negative.   Musculoskeletal: Negative.   Skin: Negative.   Allergic/Immunologic:  Negative.   Neurological: Positive for numbness. Negative for weakness.       She c/o tingling in her feet. No pain with ambulation. Appears to be worse at night. Denies LE weakness. Unable to determine what else triggers her sx.   Hematological: Negative.   Psychiatric/Behavioral: Negative.      Today's Vitals   06/16/19 1050  BP: (!) 172/96  Pulse: 91  Temp: 97.6 F (36.4 C)  TempSrc: Oral  Weight: 262 lb 9.6 oz (119.1 kg)  Height: 5' 1.6" (1.565 m)   Body mass index is 48.66 kg/m.   Objective:  Physical Exam Vitals and nursing note reviewed.  Constitutional:      Appearance: Normal appearance. She is obese.  HENT:     Head: Normocephalic and atraumatic.     Right Ear: Tympanic membrane, ear canal and external ear normal.     Left Ear:  Tympanic membrane, ear canal and external ear normal.     Nose:     Comments: Deferred, masked    Mouth/Throat:     Comments: Deferred, masked Eyes:     Extraocular Movements: Extraocular movements intact.     Conjunctiva/sclera: Conjunctivae normal.     Pupils: Pupils are equal, round, and reactive to light.  Cardiovascular:     Rate and Rhythm: Normal rate and regular rhythm.     Pulses:          Dorsalis pedis pulses are 1+ on the right side and 1+ on the left side.     Heart sounds: Normal heart sounds.  Pulmonary:     Effort: Pulmonary effort is normal.     Breath sounds: Normal breath sounds.  Abdominal:     General: Bowel sounds are normal.     Palpations: Abdomen is soft.     Comments: Rounded soft, obese  Genitourinary:    Comments: deferred Musculoskeletal:        General: Normal range of motion.     Cervical back: Normal range of motion and neck supple.  Feet:     Right foot:     Protective Sensation: 5 sites tested. 5 sites sensed.     Skin integrity: Callus and dry skin present.     Toenail Condition: Right toenails are abnormally thick.     Left foot:     Protective Sensation: 5 sites tested. 5 sites sensed.      Skin integrity: Callus and dry skin present.     Toenail Condition: Left toenails are abnormally thick.  Skin:    General: Skin is warm and dry.  Neurological:     General: No focal deficit present.     Mental Status: She is alert and oriented to person, place, and time.  Psychiatric:        Mood and Affect: Mood normal.        Behavior: Behavior normal.         Assessment And Plan:     1. Routine general medical examination at health care facility  A full exam was performed. Importance of monthly self breast exams was discussed with the patient. PATIENT IS ADVISED TO GET 30-45 MINUTES REGULAR EXERCISE NO LESS THAN FOUR TO FIVE DAYS PER WEEK - BOTH WEIGHTBEARING EXERCISES AND AEROBIC ARE RECOMMENDED.  SHE IS ADVISED TO FOLLOW A HEALTHY DIET WITH AT LEAST SIX FRUITS/VEGGIES PER DAY, DECREASE INTAKE OF RED MEAT, AND TO INCREASE FISH INTAKE TO TWO DAYS PER WEEK.  MEATS/FISH SHOULD NOT BE FRIED, BAKED OR BROILED IS PREFERABLE.  I SUGGEST WEARING SPF 50 SUNSCREEN ON EXPOSED PARTS AND ESPECIALLY WHEN IN THE DIRECT SUNLIGHT FOR AN EXTENDED PERIOD OF TIME.  PLEASE AVOID FAST FOOD RESTAURANTS AND INCREASE YOUR WATER INTAKE.   2. 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 I DISCUSSED WITH THE PATIENT AT LENGTH REGARDING THE GOALS OF GLYCEMIC CONTROL AND POSSIBLE LONG-TERM COMPLICATIONS.  I  ALSO STRESSED THE IMPORTANCE OF COMPLIANCE WITH HOME GLUCOSE MONITORING, DIETARY RESTRICTIONS INCLUDING AVOIDANCE OF SUGARY DRINKS/PROCESSED FOODS,  ALONG WITH REGULAR EXERCISE.  I  ALSO STRESSED THE IMPORTANCE OF ANNUAL EYE EXAMS, SELF FOOT CARE AND COMPLIANCE WITH OFFICE VISITS.  - POCT Urinalysis Dipstick (81002) - POCT UA - Microalbumin - BMP8+EGFR - CBC with Diff - Phosphorus - Serum protein electrophoresis with reflex - Hemoglobin A1c  3. Hypertensive nephropathy  Chronic,  uncontrolled. Exacerbation likely related  to her diet. She is encouraged to avoid adding salt to her foods and to also take meds as prescribed. Repeat BP is 150/90. I suspect once I had an SGLT2 to her diabetes regimen, her BP will improve.   - EKG 12-Lead  4. Paresthesia of both feet  Her sx are likely due to diabetic neuropathy. I will start her on gabapentin, 119m nightly. If no improvement in her sx, I will increase her dose to 2 capsules nightly.   5. Class 3 severe obesity due to excess calories with serious comorbidity and body mass index (BMI) of 45.0 to 49.9 in adult (HCC)  BMI 48. She is encouraged to strive for BMI less than 40 to decrease cardiac risk. She is encouraged to gradually increase her daily activity. Advised to perform chair exercises while seated. Also advised to increase the movement around her home.   6. Immunization due  She is in need of shingles vaccine. She declines at this time. I will address at her next visit.   RMaximino Greenland MD    THE PATIENT IS ENCOURAGED TO PRACTICE SOCIAL DISTANCING DUE TO THE COVID-19 PANDEMIC.

## 2019-06-27 ENCOUNTER — Telehealth: Payer: Self-pay

## 2019-06-27 NOTE — Telephone Encounter (Signed)
A1c Readings Pt was told to call every Monday with her morning readings May 12 136 May 13 134 May 14 153 May 16 157 May 17 146

## 2019-06-27 NOTE — Telephone Encounter (Signed)
Please ask patient to increase her insulin by 2 units nightly and update chart. t

## 2019-06-28 ENCOUNTER — Other Ambulatory Visit: Payer: Self-pay

## 2019-06-28 ENCOUNTER — Telehealth: Payer: Self-pay | Admitting: Internal Medicine

## 2019-06-28 NOTE — Chronic Care Management (AMB) (Signed)
  Care Management   Note  06/28/2019 Name: TRACI PEERS MRN: [id number] DOB: 11-16-1945  JAMILLAH LANDEROS is a 74 y.o. year old female who is a primary care patient of [name], MD and is actively engaged with the care management team. I reached out to [name] by phone today to assist with scheduling an initial visit with the Pharmacist  Follow up plan: Telephone appointment with care management team member scheduled for:07/21/2019  [facility]  [place], [place] [address] Direct Dial: [email or web address] Website: [email or web address]

## 2019-06-30 ENCOUNTER — Ambulatory Visit (INDEPENDENT_AMBULATORY_CARE_PROVIDER_SITE_OTHER): Payer: Medicare Other

## 2019-06-30 ENCOUNTER — Telehealth: Payer: Self-pay

## 2019-06-30 ENCOUNTER — Other Ambulatory Visit: Payer: Self-pay

## 2019-06-30 DIAGNOSIS — E1121 Type 2 diabetes mellitus with diabetic nephropathy: Secondary | ICD-10-CM | POA: Diagnosis not present

## 2019-06-30 DIAGNOSIS — E1122 Type 2 diabetes mellitus with diabetic chronic kidney disease: Secondary | ICD-10-CM

## 2019-06-30 DIAGNOSIS — N1832 Chronic kidney disease, stage 3b: Secondary | ICD-10-CM

## 2019-06-30 DIAGNOSIS — Z794 Long term (current) use of insulin: Secondary | ICD-10-CM | POA: Diagnosis not present

## 2019-07-01 NOTE — Patient Instructions (Signed)
Visit Information  Goals Addressed      Patient Stated   . "I would like to know more about my kidney disease" (pt-stated)       CARE PLAN ENTRY (see longtitudinal plan of care for additional care plan information)  Current Barriers:  [name] Knowledge Deficits related to disease process and Self Health management of CKD stage III . Chronic Disease Management support and education needs related to DM, CKD   Nurse Case Manager Clinical Goal(s):  [name] Over the next 90 days, patient will work with the CCM team and PCP to address needs related to disease education and support for improved self Health management of CKD . Over the next 90 days, patient will verbalize basic understanding of Chronic Kidney disease process and self health management plan as evidenced by patient will be able to maintain a GFR of 30-59  CCM RN CM Interventions:  07/01/19 call completed with patient  . Evaluation of current treatment plan related to CKD and patient's adherence to plan as established by provider. . Reinforced education to patient re: stages of CKD and patient's current GFR of 34; educated on signs/symptoms and ways to prevent further damage to kidneys . Reviewed and discussed patient's elevated potassium level; Reinforced eliminating [name] from her cooking, further educated patient on best salt substitutes to eat with kidney disease  . Discussed plans with patient for ongoing care management follow up and provided patient with direct contact information for care management team . Provided patient with printed educational materials related to 6 Ways to be Water Wise; Diabetes and Kidney Disease: What to Eat?; The Best Salt Substitutes for Kidney Patients  Patient Self Care Activities:  . Self administers medications as prescribed . Attends all scheduled provider appointments . Calls pharmacy for medication refills . Performs ADL's independently . Performs IADL's independently . Calls provider office for  new concerns or questions  Please see past updates related to this goal by clicking on the "Past Updates" button in the selected goal      . "I would like to lower my A1C" (pt-stated)       CARE PLAN ENTRY (see longtitudinal plan of care for additional care plan information)  Current Barriers:  [name] Knowledge Deficits related to disease process and Self Health management of type 2 DM   . Chronic Disease Management support and education needs related to DM, CKD stage 3   Nurse Case Manager Clinical Goal(s):  [name] Over the next 90 days, patient will work with CCM RN CM and PCP to address needs related to disease education and support to improve patient's ability to improve Self Health management of DM  . Over the next 90 days, patient will lower her A1C from 9.7, <7.0  CCM RN CM Interventions:  07/01/19 call completed with patient  . Evaluation of current treatment plan related to DM and patient's adherence to plan as established by provider. . Provided education to patient re: current A1C has decreased to 8.9 % from 9.7 % obtained on 5/6; Positive reinforcement given to patient for maiking efforts to lower her A1c and get a better control on her diabetes; educated on target A1c <7.0; educated on potential complications for uncontrolled diabetes; educated on best ways to manage DM and lower A1C; by adhering to diabetic friendly diet, implementing exercise in daily routine, 150 minutes weekly; taking diabetic medications exactly as prescribed: Determined patient provided PCP a weekly record of FBS with a few readings noted to be above  the target range  . Reviewed medications with patient and discussed indication, dosage and frequency of diabetic medications as prescribed; pt reports adherence and denies noted SE; Discussed and reviewed patient's understanding to increase her Tresiba to 14u at bedtime qd per Dr. Baird Cancer, Determined patient is taking Ozempic 0.25 mg on Wednesday and 0.5 mg on Sunday;  Determined patient just picked up her new shipment of medication from PCP office and is adhering to her prescribed regimen   . Discussed plans with patient for ongoing care management follow up and provided patient with direct contact information for care management team . Advised patient, providing education and rationale, to check cbg 1-2 times daily before meals and record, calling the CCM team and or PCP for findings outside established parameters.    Patient Self Care Activities:  . Self administers medications as prescribed . Attends all scheduled provider appointments . Calls pharmacy for medication refills . Performs ADL's independently . Performs IADL's independently . Calls provider office for new concerns or questions  Please see past updates related to this goal by clicking on the "Past Updates" button in the selected goal          Patient verbalizes understanding of instructions provided today.   Telephone follow up appointment with care management team member scheduled for: 08/01/19  Barb Merino, RN, BSN, CCM Care Management Coordinator [facility]  Direct Phone: [phone]

## 2019-07-01 NOTE — Chronic Care Management (AMB) (Signed)
Chronic Care Management   Follow Up Note   07/01/2019 Name: Teresa Stanley MRN: [id number] DOB: Jun 30, 1945  Referred by: [name], MD Reason for referral : Chronic Care Management (FU RNCM Call )   Teresa Stanley is a 74 y.o. year old female who is a primary care patient of [name], MD. The CCM team was consulted for assistance with chronic disease management and care coordination needs.    Review of patient status, including review of consultants reports, relevant laboratory and other test results, and collaboration with appropriate care team members and the patient's provider was performed as part of comprehensive patient evaluation and provision of chronic care management services.    SDOH (Social Determinants of Health) assessments performed: Yes - No Acute Challenges Identified at this time See Care Plan activities for detailed interventions related to [facility])   Placed outbound follow up call to patient for a CCM RN CM update.     Outpatient Encounter Medications as of 06/30/2019  Medication Sig  . amLODipine (NORVASC) 10 MG tablet TAKE 1 TABLET BY MOUTH EVERY DAY IN THE EVENING  . aspirin 81 MG tablet Take 81 mg by mouth daily.  [name] atorvastatin (LIPITOR) 20 MG tablet TAKE 1 TABLET BY ORAL ROUTE EVERY DAY  . Blood Glucose Monitoring Suppl (ONE TOUCH ULTRA 2) w/Device KIT Use as directed to check blood sugars 2 times per day dx:e11.22  . carboxymethylcellulose (REFRESH PLUS) 0.5 % SOLN Place 1 drop into both eyes daily.  . cholecalciferol (VITAMIN D3) 25 MCG (1000 UNIT) tablet Take 1,000 Units by mouth daily.  [name] gabapentin (NEURONTIN) 100 MG capsule One capsule po qhs  . glucose blood (ONE TOUCH ULTRA TEST) test strip Use as instructed to check blood sugars 2 times per day dx: e11.22  . insulin degludec (TRESIBA FLEXTOUCH) 100 UNIT/ML SOPN FlexTouch Pen Inject 14 Units into the skin at bedtime.  . meclizine (ANTIVERT) 25 MG tablet TAKE 1 TABLET (25 MG TOTAL) BY MOUTH 3  (THREE) TIMES DAILY AS NEEDED FOR DIZZINESS.  . methimazole (TAPAZOLE) 5 MG tablet TAKE 1 TABLET BY MOUTH EVERY DAY  . omeprazole (PRILOSEC) 20 MG capsule TAKE ONE CAPSULE BY MOUTH BEFORE A MEAL  . OneTouch Delica Lancets 62V MISC Use as directed to check blood sugars 2 times per day dx: e11.22  . propranolol ER (INDERAL LA) 120 MG 24 hr capsule TAKE 1 CAPSULE BY MOUTH EVERY DAY  . Semaglutide,0.25 or 0.5MG/DOS, (OZEMPIC, 0.25 OR 0.5 MG/DOSE,) 2 MG/1.5ML SOPN Inject 0.5 mg into the skin once a week. (Patient taking differently: Inject 0.5 mg into the skin once a week. Pt states she takes 0.25 on Wednesday and 0.5 on Sunday)  . triamcinolone cream (KENALOG) 0.1 % 1 application daily as needed.  . triamterene-hydrochlorothiazide (MAXZIDE-25) 37.5-25 MG tablet TAKE 1 TABLET BY MOUTH EVERY DAY IN THE MORNING   No facility-administered encounter medications on file as of 06/30/2019.     Objective:  Lab Results  Component Value Date   HGBA1C 8.9 (H) 06/16/2019   HGBA1C 9.7 (H) 03/17/2019   HGBA1C 8.6 (H) 12/14/2018   Lab Results  Component Value Date   MICROALBUR 80 06/16/2019   LDLCALC 80 03/17/2019   CREATININE 1.70 (H) 06/16/2019   BP Readings from Last 3 Encounters:  06/16/19 (!) 172/96  03/17/19 118/74  03/17/19 118/74    Goals Addressed      Patient Stated   . "I would like to know more about my kidney disease" (pt-stated)  CARE PLAN ENTRY (see longtitudinal plan of care for additional care plan information)  Current Barriers:  [name] Knowledge Deficits related to disease process and Self Health management of CKD stage III . Chronic Disease Management support and education needs related to DM, CKD   Nurse Case Manager Clinical Goal(s):  [name] Over the next 90 days, patient will work with the CCM team and PCP to address needs related to disease education and support for improved self Health management of CKD . Over the next 90 days, patient will verbalize basic understanding of  Chronic Kidney disease process and self health management plan as evidenced by patient will be able to maintain a GFR of 30-59  CCM RN CM Interventions:  07/01/19 call completed with patient  . Evaluation of current treatment plan related to CKD and patient's adherence to plan as established by provider. . Reinforced education to patient re: stages of CKD and patient's current GFR of 34; educated on signs/symptoms and ways to prevent further damage to kidneys . Reviewed and discussed patient's elevated potassium level; Reinforced eliminating [name] from her cooking, further educated patient on best salt substitutes to eat with kidney disease  . Discussed plans with patient for ongoing care management follow up and provided patient with direct contact information for care management team . Provided patient with printed educational materials related to 6 Ways to be Water Wise; Diabetes and Kidney Disease: What to Eat?; The Best Salt Substitutes for Kidney Patients  Patient Self Care Activities:  . Self administers medications as prescribed . Attends all scheduled provider appointments . Calls pharmacy for medication refills . Performs ADL's independently . Performs IADL's independently . Calls provider office for new concerns or questions  Please see past updates related to this goal by clicking on the "Past Updates" button in the selected goal     . "I would like to lower my A1C" (pt-stated)       CARE PLAN ENTRY (see longtitudinal plan of care for additional care plan information)  Current Barriers:  [name] Knowledge Deficits related to disease process and Self Health management of type 2 DM   . Chronic Disease Management support and education needs related to DM, CKD stage 3   Nurse Case Manager Clinical Goal(s):  [name] Over the next 90 days, patient will work with CCM RN CM and PCP to address needs related to disease education and support to improve patient's ability to improve Self Health  management of DM  . Over the next 90 days, patient will lower her A1C from 9.7, <7.0  CCM RN CM Interventions:  07/01/19 call completed with patient  . Evaluation of current treatment plan related to DM and patient's adherence to plan as established by provider. . Provided education to patient re: current A1C has decreased to 8.9 % from 9.7 % obtained on 5/6; Positive reinforcement given to patient for maiking efforts to lower her A1c and get a better control on her diabetes; educated on target A1c <7.0; educated on potential complications for uncontrolled diabetes; educated on best ways to manage DM and lower A1C; by adhering to diabetic friendly diet, implementing exercise in daily routine, 150 minutes weekly; taking diabetic medications exactly as prescribed: Determined patient provided PCP a weekly record of FBS with a few readings noted to be above the target range  . Reviewed medications with patient and discussed indication, dosage and frequency of diabetic medications as prescribed; pt reports adherence and denies noted SE; Discussed and reviewed patient's understanding  to increase her [name] to 14u at bedtime qd per Dr. Baird Cancer, Determined patient is taking Ozempic 0.25 mg on Wednesday and 0.5 mg on Sunday; Determined patient just picked up her new shipment of medication from PCP office and is adhering to her prescribed regimen   . Discussed plans with patient for ongoing care management follow up and provided patient with direct contact information for care management team . Advised patient, providing education and rationale, to check cbg 1-2 times daily before meals and record, calling the CCM team and or PCP for findings outside established parameters.    Patient Self Care Activities:  . Self administers medications as prescribed . Attends all scheduled provider appointments . Calls pharmacy for medication refills . Performs ADL's independently . Performs IADL's independently . Calls  provider office for new concerns or questions  Please see past updates related to this goal by clicking on the "Past Updates" button in the selected goal         Plan:   Telephone follow up appointment with care management team member scheduled for: 08/01/19   Barb Merino, RN, BSN, CCM Care Management Coordinator [facility]  Direct Phone: [phone]

## 2019-07-03 ENCOUNTER — Other Ambulatory Visit: Payer: Self-pay | Admitting: Internal Medicine

## 2019-07-04 ENCOUNTER — Other Ambulatory Visit: Payer: Self-pay | Admitting: Internal Medicine

## 2019-07-07 ENCOUNTER — Telehealth: Payer: Self-pay

## 2019-07-07 ENCOUNTER — Other Ambulatory Visit: Payer: Self-pay | Admitting: Internal Medicine

## 2019-07-07 NOTE — Telephone Encounter (Signed)
Forms faxed for diabetic supplies

## 2019-07-08 ENCOUNTER — Telehealth: Payer: Self-pay

## 2019-07-08 NOTE — Telephone Encounter (Signed)
-----   Message from [name], MD sent at 07/06/2019  8:10 PM EDT ----- Please ask her to increase to 16 unites, pls update chart.  ----- Message ----- From: Michelle Nasuti, [place] Sent: 07/06/2019   4:05 PM EDT To: [name], MD  The pt called in her sugar readings for last week. Monday 149, Tues 130, Wednes 134, Thurs 179, Fri 140, Sat 145, Sun 143, this Week Mon 145.  The pt said that she is currently on 14 units of tresiba.

## 2019-07-09 ENCOUNTER — Other Ambulatory Visit: Payer: Self-pay | Admitting: Internal Medicine

## 2019-07-14 ENCOUNTER — Ambulatory Visit: Payer: Medicare Other | Admitting: Internal Medicine

## 2019-07-18 ENCOUNTER — Telehealth: Payer: Self-pay

## 2019-07-18 NOTE — Telephone Encounter (Signed)
The pt called with her sugar readings for last week, may 31st 119, June 1st 118, 3rd 112, 4th 124, 25th 134, 6th 145 and she was told that Dr. Baird Cancer said that the pt's sugars are better and that she doesn't need to call weekly with her blood sugars anymore.

## 2019-07-21 ENCOUNTER — Telehealth: Payer: Medicare Other

## 2019-08-01 ENCOUNTER — Telehealth: Payer: Self-pay

## 2019-08-01 ENCOUNTER — Ambulatory Visit: Payer: Self-pay

## 2019-08-01 ENCOUNTER — Other Ambulatory Visit: Payer: Self-pay

## 2019-08-01 DIAGNOSIS — N1832 Chronic kidney disease, stage 3b: Secondary | ICD-10-CM

## 2019-08-01 DIAGNOSIS — E1122 Type 2 diabetes mellitus with diabetic chronic kidney disease: Secondary | ICD-10-CM

## 2019-08-02 NOTE — Chronic Care Management (AMB) (Signed)
Chronic Care Management   Follow Up Note   08/02/2019 Name: Teresa Stanley MRN: [id number] DOB: 1945-07-05  Referred by: [name], MD Reason for referral : Chronic Care Management (FU RN CM Call - DM/CKD )   Teresa Stanley is a 74 y.o. year old female who is a primary care patient of [name], MD. The CCM team was consulted for assistance with chronic disease management and care coordination needs.    Review of patient status, including review of consultants reports, relevant laboratory and other test results, and collaboration with appropriate care team members and the patient's provider was performed as part of comprehensive patient evaluation and provision of chronic care management services.    SDOH (Social Determinants of Health) assessments performed: No See Care Plan activities for detailed interventions related to [facility])   Reviewed case in preparation to contact patient. Coordinated next call following PCP and Pharm D OV scheduled for 08/04/19.     Outpatient Encounter Medications as of 08/01/2019  Medication Sig  . amLODipine (NORVASC) 10 MG tablet TAKE 1 TABLET BY MOUTH EVERY DAY IN THE EVENING  . aspirin 81 MG tablet Take 81 mg by mouth daily.  [name] atorvastatin (LIPITOR) 20 MG tablet TAKE 1 TABLET BY ORAL ROUTE EVERY DAY  . Blood Glucose Monitoring Suppl (ONE TOUCH ULTRA 2) w/Device KIT Use as directed to check blood sugars 2 times per day dx:e11.22  . carboxymethylcellulose (REFRESH PLUS) 0.5 % SOLN Place 1 drop into both eyes daily.  . cholecalciferol (VITAMIN D3) 25 MCG (1000 UNIT) tablet Take 1,000 Units by mouth daily.  [name] gabapentin (NEURONTIN) 100 MG capsule One capsule po qhs  . glucose blood (ONE TOUCH ULTRA TEST) test strip Use as instructed to check blood sugars 2 times per day dx: e11.22  . insulin degludec (TRESIBA FLEXTOUCH) 100 UNIT/ML SOPN FlexTouch Pen Inject 14 Units into the skin at bedtime.  . meclizine (ANTIVERT) 25 MG tablet TAKE 1 TABLET (25 MG  TOTAL) BY MOUTH 3 (THREE) TIMES DAILY AS NEEDED FOR DIZZINESS.  . methimazole (TAPAZOLE) 5 MG tablet TAKE 1 TABLET BY MOUTH EVERY DAY  . omeprazole (PRILOSEC) 20 MG capsule TAKE ONE CAPSULE BY MOUTH BEFORE A MEAL  . OneTouch Delica Lancets 85Y MISC Use as directed to check blood sugars 2 times per day dx: e11.22  . propranolol ER (INDERAL LA) 120 MG 24 hr capsule TAKE 1 CAPSULE BY MOUTH EVERY DAY  . Semaglutide,0.25 or 0.5MG/DOS, (OZEMPIC, 0.25 OR 0.5 MG/DOSE,) 2 MG/1.5ML SOPN Inject 0.5 mg into the skin once a week. (Patient taking differently: Inject 0.5 mg into the skin once a week. Pt states she takes 0.25 on Wednesday and 0.5 on Sunday)  . triamcinolone cream (KENALOG) 0.1 % 1 application daily as needed.  . triamterene-hydrochlorothiazide (MAXZIDE-25) 37.5-25 MG tablet TAKE 1 TABLET BY MOUTH EVERY DAY IN THE MORNING   No facility-administered encounter medications on file as of 08/01/2019.     Objective:  Lab Results  Component Value Date   HGBA1C 8.9 (H) 06/16/2019   HGBA1C 9.7 (H) 03/17/2019   HGBA1C 8.6 (H) 12/14/2018   Lab Results  Component Value Date   MICROALBUR 80 06/16/2019   LDLCALC 80 03/17/2019   CREATININE 1.70 (H) 06/16/2019   BP Readings from Last 3 Encounters:  06/16/19 (!) 172/96  03/17/19 118/74  03/17/19 118/74   Plan:   Telephone follow up appointment with care management team member scheduled for: 08/08/19  Barb Merino, RN, BSN, CCM Care Management  Coordinator [facility]  Direct Phone: [phone]    

## 2019-08-04 ENCOUNTER — Encounter: Payer: Self-pay | Admitting: Internal Medicine

## 2019-08-04 ENCOUNTER — Other Ambulatory Visit: Payer: Self-pay

## 2019-08-04 ENCOUNTER — Ambulatory Visit (INDEPENDENT_AMBULATORY_CARE_PROVIDER_SITE_OTHER): Payer: Medicare Other | Admitting: Internal Medicine

## 2019-08-04 ENCOUNTER — Ambulatory Visit: Payer: Medicare Other

## 2019-08-04 VITALS — BP 160/92 | HR 87 | Temp 98.1°F | Ht 61.6 in | Wt 267.8 lb

## 2019-08-04 DIAGNOSIS — I129 Hypertensive chronic kidney disease with stage 1 through stage 4 chronic kidney disease, or unspecified chronic kidney disease: Secondary | ICD-10-CM

## 2019-08-04 DIAGNOSIS — Z6841 Body Mass Index (BMI) 40.0 and over, adult: Secondary | ICD-10-CM

## 2019-08-04 DIAGNOSIS — E1122 Type 2 diabetes mellitus with diabetic chronic kidney disease: Secondary | ICD-10-CM | POA: Diagnosis not present

## 2019-08-04 DIAGNOSIS — N1832 Chronic kidney disease, stage 3b: Secondary | ICD-10-CM

## 2019-08-04 DIAGNOSIS — N183 Chronic kidney disease, stage 3 unspecified: Secondary | ICD-10-CM

## 2019-08-04 DIAGNOSIS — E66813 Obesity, class 3: Secondary | ICD-10-CM

## 2019-08-04 MED ORDER — HYDRALAZINE HCL 25 MG PO TABS
ORAL_TABLET | ORAL | 1 refills | Status: DC
Start: 2019-08-04 — End: 2019-08-26

## 2019-08-04 NOTE — Chronic Care Management (AMB) (Signed)
Chronic Care Management Pharmacy  Name: Teresa Stanley  MRN: [id number] DOB: Apr 09, 1945  Chief Complaint/ HPI  Teresa Stanley,  74 y.o. , female presents for their Initial CCM visit with the clinical pharmacist In office.  PCP : Teresa Chard, MD  Their chronic conditions include: Hypertension, Diabetes mellitus with CKD  Office Visits: 08/04/19 OV: Started hydralazine 78m twice daily for blood pressure. Increase gabapentin to 2061mnightly. 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   07/18/19 Telephone call: Pt called with weekly BG readings (119,118, 112, 124, 134, 145). Pt's BG has improved and PCP told pt she does not need to call weekly anymore.   07/08/19 Telephone call: Pt called with weekly BG readings (149, 130, 134, 179, 140, 145, 143, 145) on 14 units of [place] instructed to increase to 16 units daily.   06/16/19 OV: Presented for full physical exam and DM and HTN follow up. Diabetic foot exam performed. Shingles vaccine declined at this time.   03/17/19 AWV and OV: HTN/DM follow up visit. Both conditions are chronic and fairly controlled on current medications. Labs ordered (CMP14+EGFR, lipid panel, HgbA1c). Pt will need to be started on insulin due to increase in A1c.   02/16/19 Televisit: Complains of symptoms concerning for COVID-19 (cough, sinus congestion). COVID test last Saturday was negative. Try loratadine 1012maily for postnasal drip.   Consult Visits:  CCM Encounters: 05/04/19 PharmD: Increasing [name] 10 units at bedtime.   04/25/19 RN: Provided patient education regarding chronic disease states and treatment regimens  04/14/19 PharmD: Inc[place] DrapeeTyler Aas 8 units at bedtime  04/14/19 CPhT: TreTyler Aasproved through [employer] 02/10/20  03/30/19 CPhT: completed application for Tresiba faxed to [employer]/15/21 PharmD: Adding TreTyler Aasunits at bedtime due to increase of A1c to 9.6. Add [name] pt assistance and counsel on proper use.   02/23/19  CPhT: Approved for Ozempic through [employer] 01/10/20  Medications: Outpatient Encounter Medications as of 08/04/2019  Medication Sig  . amLODipine (NORVASC) 10 MG tablet TAKE 1 TABLET BY MOUTH EVERY DAY IN THE EVENING  . aspirin 81 MG tablet Take 81 mg by mouth daily.  . aMarland Kitchenorvastatin (LIPITOR) 20 MG tablet TAKE 1 TABLET BY ORAL ROUTE EVERY DAY  . Blood Glucose Monitoring Suppl (ONE TOUCH ULTRA 2) w/Device KIT Use as directed to check blood sugars 2 times per day dx:e11.22  . carboxymethylcellulose (REFRESH PLUS) 0.5 % SOLN Place 1 drop into both eyes daily.  . cholecalciferol (VITAMIN D3) 25 MCG (1000 UNIT) tablet Take 1,000 Units by mouth daily.  . gMarland Kitchenbapentin (NEURONTIN) 100 MG capsule Take 2 capsules po qhs  . glucose blood (ONE TOUCH ULTRA TEST) test strip Use as instructed to check blood sugars 2 times per day dx: e11.22  . hydrALAZINE (APRESOLINE) 25 MG tablet TAKE 1 TABLET BY MOUTH TWICE A DAY  . insulin degludec (TRESIBA FLEXTOUCH) 100 UNIT/ML SOPN FlexTouch Pen Inject 16 Units into the skin at bedtime.   . meclizine (ANTIVERT) 25 MG tablet TAKE 1 TABLET (25 MG TOTAL) BY MOUTH 3 (THREE) TIMES DAILY AS NEEDED FOR DIZZINESS.  . methimazole (TAPAZOLE) 5 MG tablet TAKE 1 TABLET BY MOUTH EVERY DAY  . omeprazole (PRILOSEC) 20 MG capsule TAKE ONE CAPSULE BY MOUTH BEFORE A MEAL  . OneTouch Delica Lancets [id number] Use as directed to check blood sugars 2 times per day dx: e11.22  . propranolol ER (INDERAL LA) 120 MG 24 hr capsule TAKE 1  CAPSULE BY MOUTH EVERY DAY  . Semaglutide,0.25 or 0.'5MG'$ /DOS, (OZEMPIC, 0.25 OR 0.5 MG/DOSE,) 2 MG/1.5ML SOPN Inject 0.5 mg into the skin once a week. (Patient taking differently: Inject 0.5 mg into the skin once a week. Pt states she takes 0.25 on Wednesday and 0.5 on Sunday)  . triamterene-hydrochlorothiazide (MAXZIDE-25) 37.5-25 MG tablet TAKE 1 TABLET BY MOUTH EVERY DAY IN THE MORNING  . [DISCONTINUED] gabapentin (NEURONTIN) 100 MG capsule One capsule  po qhs  . [DISCONTINUED] hydrALAZINE (APRESOLINE) 25 MG tablet One tab po twice daily  . [DISCONTINUED] triamcinolone cream (KENALOG) 0.1 % 1 application daily as needed. (Patient not taking: Reported on 08/03/2019)   No facility-administered encounter medications on file as of 08/04/2019.    Current Diagnosis/Assessment:  SDOH Interventions     Most Recent Value  SDOH Interventions  Financial Strain Interventions Other (Comment)  [Will continue to assist with obtaining medications via patient assistance program]      Goals Addressed            This Visit's Progress   . Pharmacy Care Plan       CARE PLAN ENTRY (see longitudinal plan of care for additional care plan information)  Current Barriers:  . Chronic Disease Management support, education, and care coordination needs related to Hypertension, Hyperlipidemia, and Diabetes   Hypertension BP Readings from Last 3 Encounters:  08/23/19 (!) 148/86  08/04/19 (!) 160/92  06/16/19 (!) 172/96   . Pharmacist Clinical Goal(s): o Over the next 90 days, patient will work with PharmD and providers to achieve BP goal <130/80 . Current regimen:   Amlodipine '10mg'$  daily  Triamterene/ HCTZ 37.'5mg'$ /'25mg'$  daily  Propranolol ER '120mg'$  daily  Hydralazine '25mg'$  twice daily . Interventions: o Provided dietary and exercise recommendations - Carbohydrate counting - Provided patient with "Diabetes and You" booklet o Encouraged increased water intake to 64 ounces daily . Patient self care activities - Over the next 90 days, patient will: o Check BP weekly, document, and provide at future appointments o Ensure daily salt intake < 2300 mg/day o Exercise 30 minutes daily 5 days per week  Hyperlipidemia Lab Results  Component Value Date/Time   LDLCALC 80 03/17/2019 10:41 AM   . Pharmacist Clinical Goal(s): o Over the next 90 days, patient will work with PharmD and providers to achieve LDL goal < 70 . Current regimen:  o Atorvastatin '20mg'$   daily . Interventions: o Provided dietary and exercise recommendations o Addressed cholesterol goals . Patient self care activities - Over the next 90 days, patient will: o Focus on a heart healthy diet o Exercise 30 minutes daily 5 days per week  Diabetes Lab Results  Component Value Date/Time   HGBA1C 8.9 (H) 06/16/2019 05:02 PM   HGBA1C 9.7 (H) 03/17/2019 10:41 AM   . Pharmacist Clinical Goal(s): o Over the next 90 days, patient will work with PharmD and providers to achieve A1c goal <7% . Current regimen:  o Tresiba 16 units daily o Ozempic 0.'5mg'$  once weekly- per patient taking a total of 0.'75mg'$  weekly on 2 separate days . Interventions: o Discuss increasing Ozempic to '1mg'$  daily with PCP . Patient self care activities - Over the next 90 days, patient will: o Check blood sugar once daily, document, and provide at future appointments o Contact provider with any episodes of hypoglycemia  Medication management . Pharmacist Clinical Goal(s): o Over the next 90 days, patient will work with PharmD and providers to maintain optimal medication adherence . Current [facility]: [facility] . Interventions  o Comprehensive medication review performed. o Continue current medication management strategy . Patient self care activities - Over the next 90 days, patient will: o Focus on medication adherence by continued use of pill box o Take medications as prescribed o Report any questions or concerns to PharmD and/or provider(s)  Initial goal documentation        Diabetes   Recent Relevant Labs: Lab Results  Component Value Date/Time   HGBA1C 8.9 (H) 06/16/2019 05:02 PM   HGBA1C 9.7 (H) 03/17/2019 10:41 AM   MICROALBUR 80 06/16/2019 02:39 PM   MICROALBUR 150 03/17/2019 11:53 AM    Checking BG: Daily  Recent FBG Readings: 119/120, one morning was 140 Recent pre-meal BG readings:  Recent 2hr PP BG readings:   Recent HS BG readings:  Patient has failed these meds in past: Glipizide,  Victoza, Metformin, pioglitazone Patient is currently uncontrolled on the following medications:   Ozempic 0.'5mg'$  once weekly  0.'25mg'$  weekly on Wednesdays, 0.'5mg'$  weekly on Sundays  Tresiba 16 units daily  Aspirin '81mg'$  daily  Last diabetic Foot exam: 12/14/18 Last diabetic Eye exam:  Lab Results  Component Value Date/Time   HMDIABEYEEXA No Retinopathy 04/01/2019 12:00 AM    We discussed:  Pt denies side effects from current medications  Denies signs/symptoms of hypoglycemia, but does know how to treat hypoglycemia  Plan Continue current medications  Consult with PCP regarding twice weekly dosing of Ozempic  Discuss with PCP increasing Ozempic 1 mg weekly  Hypertension   Office blood pressures are  BP Readings from Last 3 Encounters:  08/23/19 (!) 148/86  08/04/19 (!) 160/92  06/16/19 (!) 172/96   Patient has failed these meds in the past: Chlorthalidone, Clonidine, Lisinopril, Metoprolol tartrate,  Patient is currently uncontrolled on the following medications:   Amlodipine '10mg'$  daily  Triamterene/ HCTZ 37.'5mg'$ /'25mg'$  daily  Propranolol ER '120mg'$  daily  Hydralazine '25mg'$  twice daily--started at office visit today  Patient checks BP at home weekly  Patient home BP readings are ranging: 120-140/45-50  We discussed:  . Diet extensively o Likes salad, greens, chicken, shrimp o Buys fruit cups with no sugar o Sometimes eats cake/ice cream o Drink zero sugar Gatorade o Drinks 3-16 oz bottle of water - Encourage pt to increase water intake to 4- 16 ounce glasses daily o Gave pt "Diabetes and you" book o Discussed carbohydrate counting Exercise extensively o Uses hand weights some o Walks some o Limited activity due to neuropathy o Goal 30 minutes daily 5 days per week  Plan Continue current medications   Hyperlipidemia   LDL goal < 70  Lipid Panel     Component Value Date/Time   CHOL 162 03/17/2019 1041   TRIG 124 03/17/2019 1041   HDL 60 03/17/2019  1041   LDLCALC 80 03/17/2019 1041    Hepatic Function Latest Ref Rng & Units 06/16/2019 03/17/2019 06/02/2018  Total Protein 6.0 - 8.5 g/dL 7.4 7.1 6.7  Albumin 3.7 - 4.7 g/dL - 4.3 -  AST 0 - 40 IU/L - 15 -  ALT 0 - 32 IU/L - 10 -  Alk Phosphatase 39 - 117 IU/L - 175(H) -  Total Bilirubin 0.0 - 1.2 mg/dL - 0.4 -    The 10-year ASCVD risk score Mikey Bussing DC Jr., et al., 2013) is: 30.8%   Values used to calculate the score:     Age: 55 years     Sex: Female     Is Non-Hispanic African American: Yes     Diabetic: Yes  Tobacco smoker: No     Systolic Blood Pressure: 704 mmHg     Is BP treated: Yes     HDL Cholesterol: 60 mg/dL     Total Cholesterol: 162 mg/dL   Patient has failed these meds in past: N/A Patient is currently uncontrolled on the following medications:  . Atorvastatin 37m daily  We discussed: Diet and exercise extensively  LDL above goal of 70 and HDL below goal of 50  Plan Continue current medications along with lifestyle modifications  Hyperthyroidism   Lab Results  Component Value Date/Time   TSH 0.005 (L) 08/23/2009 07:54 PM   TSH 0.022 (L) 11/14/2008 08:42 PM   FREET4 1.33 08/23/2009 07:54 PM   FREET4 1.32 11/14/2008 08:42 PM   Patient has failed these meds in past: N/A Patient is currently controlled on the following medications:  .Marland KitchenMethimazole 56mdaily  Plan Continue current medications  Recheck Thyroid hormone levels at next PCP appointment  Diabetic Neuropathy   Patient has failed these meds in past: N/A Patient is currently uncontrolled on the following medications:   Gabapentin 10016m capsules nightly  We discussed:  Gabapentin just increased to 2 capsules nightly  Plan Continue current medications  Vaccines   Reviewed and discussed patient's vaccination history.    Immunization History  Administered Date(s) Administered  . Influenza, High Dose Seasonal PF 11/12/2017, 11/09/2018  . PFIZER SARS-COV-2 Vaccination 04/04/2019,  04/25/2019  . Pneumococcal Polysaccharide-23 11/29/2015   Discussed the importance of vaccinations (specifically pneumonia and shingles)  Plan Recommended patient receive Prevnar13 and Shingrix vaccines in pharmacy.   Medication Management   [facility] Uses pill box? Yes Pt endorses 100% compliance  We discussed:   Importance of daily medication adherence  Plan Continue current medication management strategy  Check preferred pharmacy status Discuss adherence packaging and synchronization at follow up  Follow up: 2 month phone visit  CouJannette FogoharmD Clinical Pharmacist [facility] [phone]

## 2019-08-04 NOTE — Progress Notes (Signed)
This visit occurred during the SARS-CoV-2 public health emergency.  Safety protocols were in place, including screening questions prior to the visit, additional usage of staff PPE, and extensive cleaning of exam room while observing appropriate contact time as indicated for disinfecting solutions.  Subjective:     Patient ID: Teresa Stanley , female    DOB: [birth year] , 74 y.o.   MRN: [id number]   Chief Complaint  Patient presents with  . Diabetes  . Hypertension    HPI  She presents today for f/u dm/htn.  She reports compliance with meds. She reports her BS have improved greatly.   Diabetes She presents for her follow-up diabetic visit. She has type 2 diabetes mellitus. Her disease course has been stable. There are no hypoglycemic associated symptoms. Pertinent negatives for diabetes include no blurred vision and no chest pain. There are no hypoglycemic complications. Diabetic complications include nephropathy. Risk factors for coronary artery disease include diabetes mellitus, dyslipidemia, obesity, post-menopausal and sedentary lifestyle. She is compliant with treatment some of the time. Her weight is decreasing steadily. She is following a generally healthy diet. She participates in exercise intermittently. Her breakfast blood glucose is taken between 8-9 am. Her breakfast blood glucose range is generally 130-140 mg/dl. An ACE inhibitor/angiotensin II receptor blocker is being taken. Eye exam is current.  Hypertension The current episode started more than 1 year ago. The problem has been gradually improving since onset. The problem is uncontrolled. Pertinent negatives include no blurred vision, chest pain, palpitations or shortness of breath. The current treatment provides moderate improvement. Compliance problems include exercise.  Hypertensive end-organ damage includes kidney disease.     Past Medical History:  Diagnosis Date  . Arthritis   . Diabetes mellitus    takes Actos and  Metformin daily and Victoza  . GERD (gastroesophageal reflux disease)    takes Omeprazole daily  . Headache(784.0)    occasionally  . Hyperlipidemia    takes Atorvastatin daily  . Hypertension    takes Lisinopril,Amlodipine,and Metoprolol daily  . Hypothyroidism   . Joint pain   . Thyroid disease    ?     Family History  Problem Relation Age of Onset  . Diabetes Mother   . Hypertension Mother   . Diabetes Father   . Asthma Father   . Hypertension Father   . Hypertension Daughter   . Hypertension Sister        2  . Diabetes Sister   . Hypertension Brother        5  . Kidney disease Brother   . Colon cancer Neg Hx   . Esophageal cancer Neg Hx   . Rectal cancer Neg Hx   . Stomach cancer Neg Hx      Current Outpatient Medications:  .  amLODipine (NORVASC) 10 MG tablet, TAKE 1 TABLET BY MOUTH EVERY DAY IN THE EVENING, Disp: 90 tablet, Rfl: 2 .  aspirin 81 MG tablet, Take 81 mg by mouth daily., Disp: , Rfl:  .  atorvastatin (LIPITOR) 20 MG tablet, TAKE 1 TABLET BY ORAL ROUTE EVERY DAY, Disp: 90 tablet, Rfl: 2 .  Blood Glucose Monitoring Suppl (ONE TOUCH ULTRA 2) w/Device KIT, Use as directed to check blood sugars 2 times per day dx:e11.22, Disp: 1 each, Rfl: 1 .  carboxymethylcellulose (REFRESH PLUS) 0.5 % SOLN, Place 1 drop into both eyes daily., Disp: , Rfl:  .  cholecalciferol (VITAMIN D3) 25 MCG (1000 UNIT) tablet, Take 1,000 Units by mouth daily., Disp: ,  Rfl:  .  gabapentin (NEURONTIN) 100 MG capsule, One capsule po qhs, Disp: 30 capsule, Rfl: 2 .  glucose blood (ONE TOUCH ULTRA TEST) test strip, Use as instructed to check blood sugars 2 times per day dx: e11.22, Disp: 300 each, Rfl: 2 .  insulin degludec (TRESIBA FLEXTOUCH) 100 UNIT/ML SOPN FlexTouch Pen, Inject 16 Units into the skin at bedtime. , Disp: , Rfl:  .  meclizine (ANTIVERT) 25 MG tablet, TAKE 1 TABLET (25 MG TOTAL) BY MOUTH 3 (THREE) TIMES DAILY AS NEEDED FOR DIZZINESS., Disp: 30 tablet, Rfl: 0 .   methimazole (TAPAZOLE) 5 MG tablet, TAKE 1 TABLET BY MOUTH EVERY DAY, Disp: 90 tablet, Rfl: 1 .  omeprazole (PRILOSEC) 20 MG capsule, TAKE ONE CAPSULE BY MOUTH BEFORE A MEAL, Disp: 90 capsule, Rfl: 1 .  OneTouch Delica Lancets 93Y MISC, Use as directed to check blood sugars 2 times per day dx: e11.22, Disp: 300 each, Rfl: 2 .  propranolol ER (INDERAL LA) 120 MG 24 hr capsule, TAKE 1 CAPSULE BY MOUTH EVERY DAY, Disp: 90 capsule, Rfl: 0 .  Semaglutide,0.25 or 0.5MG/DOS, (OZEMPIC, 0.25 OR 0.5 MG/DOSE,) 2 MG/1.5ML SOPN, Inject 0.5 mg into the skin once a week. (Patient taking differently: Inject 0.5 mg into the skin once a week. Pt states she takes 0.25 on Wednesday and 0.5 on Sunday), Disp: 3 pen, Rfl: 1 .  triamterene-hydrochlorothiazide (MAXZIDE-25) 37.5-25 MG tablet, TAKE 1 TABLET BY MOUTH EVERY DAY IN THE MORNING, Disp: 90 tablet, Rfl: 1 .  hydrALAZINE (APRESOLINE) 25 MG tablet, One tab po twice daily, Disp: 60 tablet, Rfl: 1 .  triamcinolone cream (KENALOG) 0.1 %, 1 application daily as needed. (Patient not taking: Reported on 08/03/2019), Disp: , Rfl:    No Known Allergies   Review of Systems  Constitutional: Negative.   Eyes: Negative for blurred vision.  Respiratory: Negative.  Negative for shortness of breath.   Cardiovascular: Negative.  Negative for chest pain and palpitations.  Gastrointestinal: Negative.   Neurological: Negative.   Psychiatric/Behavioral: Negative.      Today's Vitals   08/04/19 0858  BP: (!) 160/92  Pulse: 87  Temp: 98.1 F (36.7 C)  TempSrc: Oral  Weight: 267 lb 12.8 oz (121.5 kg)  Height: 5' 1.6" (1.565 m)   Body mass index is 49.62 kg/m.   Objective:  Physical Exam Vitals and nursing note reviewed.  Constitutional:      Appearance: Normal appearance.  HENT:     Head: Normocephalic and atraumatic.  Cardiovascular:     Rate and Rhythm: Normal rate and regular rhythm.     Heart sounds: Murmur heard.   Pulmonary:     Effort: Pulmonary effort is  normal.     Breath sounds: Normal breath sounds.  Skin:    General: Skin is warm.  Neurological:     General: No focal deficit present.     Mental Status: She is alert.  Psychiatric:        Mood and Affect: Mood normal.        Behavior: Behavior normal.         Assessment And Plan:     1. Diabetes mellitus with stage 3 chronic kidney disease (HCC)  Chronic. Last a1c 8.19 Jun 2019, insulin dose was adjusted. She has tolerated this without any issues. She is encouraged to avoid sugary beverages.   2. Hypertensive nephropathy  Chronic, uncontrolled.  I will add hydralazine 57m po twice daily. She agrees to rto in 1-2 weeks for  a nurse visit.  I will see her back in four to six weeks.   3. Stage 3b chronic kidney disease  Chronic. This has been stable; however, I would like to refer her to Renal for further evaluation.  Encouraged to stay well hydrated.   - Ambulatory referral to Nephrology  4. Class 3 severe obesity due to excess calories with serious comorbidity and body mass index (BMI) of 45.0 to 49.9 in adult (HCC)  BMI 49. She is encouraged to strive for BMI less than 40 to decrease cardiac risk. Again encourage to incorporate more activity into her daily routine. Encouraged to perform chair exercises while seated.  Maximino Greenland, MD    THE PATIENT IS ENCOURAGED TO PRACTICE SOCIAL DISTANCING DUE TO THE COVID-19 PANDEMIC.

## 2019-08-04 NOTE — Patient Instructions (Signed)
Start hydralazine 25mg  twice daily for blood pressure  Increase gabapentin to 2 - 100mg  capsules nightly   Diabetic Neuropathy Diabetic neuropathy refers to nerve damage that is caused by diabetes (diabetes mellitus). Over time, people with diabetes can develop nerve damage throughout the body. There are several types of diabetic neuropathy:  Peripheral neuropathy. This is the most common type of diabetic neuropathy. It causes damage to nerves that carry signals between the spinal cord and other parts of the body (peripheral nerves). This usually affects nerves in the feet and legs first, and may eventually affect the hands and arms. The damage affects the ability to sense touch or temperature.  Autonomic neuropathy. This type causes damage to nerves that control involuntary functions (autonomic nerves). These nerves carry signals that control: ? Heartbeat. ? Body temperature. ? Blood pressure. ? Urination. ? Digestion. ? Sweating. ? Sexual function. ? Response to changing blood sugar (glucose) levels.  Focal neuropathy. This type of nerve damage affects one area of the body, such as an arm, a leg, or the face. The injury may involve one nerve or a small group of nerves. Focal neuropathy can be painful and unpredictable, and occurs most often in older adults with diabetes. This often develops suddenly, but usually improves over time and does not cause long-term problems.  Proximal neuropathy. This type of nerve damage affects the nerves of the thighs, hips, buttocks, or legs. It causes severe pain, weakness, and muscle death (atrophy), usually in the thigh muscles. It is more common among older men and people who have type 2 diabetes. The length of recovery time may vary. What are the causes? Peripheral, autonomic, and focal neuropathies are caused by diabetes that is not well controlled with treatment. The cause of proximal neuropathy is not known, but it may be caused by inflammation  related to uncontrolled blood glucose levels. What are the signs or symptoms? Peripheral neuropathy Peripheral neuropathy develops slowly over time. When the nerves of the feet and legs no longer work, you may experience:  Burning, stabbing, or aching pain in the legs or feet.  Pain or cramping in the legs or feet.  Loss of feeling (numbness) and inability to feel pressure or pain in the feet. This can lead to: ? Thick calluses or sores on areas of constant pressure. ? Ulcers. ? Reduced ability to feel temperature changes.  Foot deformities.  Muscle weakness.  Loss of balance or coordination. Autonomic neuropathy The symptoms of autonomic neuropathy vary depending on which nerves are affected. Symptoms may include:  Problems with digestion, such as: ? Nausea or vomiting. ? Poor appetite. ? Bloating. ? Diarrhea or constipation. ? Trouble swallowing. ? Losing weight without trying to.  Problems with the heart, blood and lungs, such as: ? Dizziness, especially when standing up. ? Fainting. ? Shortness of breath. ? Irregular heartbeat.  Bladder problems, such as: ? Trouble starting or stopping urination. ? Leaking urine. ? Trouble emptying the bladder. ? Urinary tract infections (UTIs).  Problems with other body functions, such as: ? Sweat. You may sweat too much or too little. ? Temperature. You might get hot easily. Or, you might feel cold more than usual. ? Sexual function. Men may not be able to get or maintain an erection. Women may have vaginal dryness and difficulty with arousal. Focal neuropathy Symptoms affect only one area of the body. Common symptoms include:  Numbness.  Tingling.  Burning pain.  Prickling feeling.  Very sensitive skin.  Weakness.  Inability to  move (paralysis).  Muscle twitching.  Muscles getting smaller (wasting).  Poor coordination.  Double or blurred vision. Proximal neuropathy  Sudden, severe pain in the hip, thigh,  or buttocks. Pain may spread from the back into the legs (sciatica).  Pain and numbness in the arms and legs.  Tingling.  Loss of bladder control or bowel control.  Weakness and wasting of thigh muscles.  Difficulty getting up from a seated position.  Abdominal swelling.  Unexplained weight loss. How is this diagnosed? Diagnosis usually involves reviewing your medical history and any symptoms you have. Diagnosis varies depending on the type of neuropathy your health care provider suspects. Peripheral neuropathy Your health care provider will check areas that are affected by your nervous system (neurologic exam), such as your reflexes, how you move, and what you can feel. You may have other tests, such as:  Blood tests.  Removal and examination of fluid that surrounds the spinal cord (lumbar puncture).  CT scan.  MRI.  A test to check the nerves that control muscles (electromyogram, EMG).  Tests of how quickly messages pass through your nerves (nerve conduction velocity tests).  Removal of a small piece of nerve to be examined under a microscope (biopsy). Autonomic neuropathy You may have tests, such as:  Tests to measure your blood pressure and heart rate. This may include monitoring you while you are safely secured to an exam table that moves you from a lying position to an upright position (table tilt test).  Breathing tests to check your lungs.  Tests to check how food moves through the digestive system (gastric emptying tests).  Blood, sweat, or urine tests.  Ultrasound of your bladder.  Spinal fluid tests. Focal neuropathy This condition may be diagnosed with:  A neurologic exam.  CT scan.  MRI.  EMG.  Nerve conduction velocity tests. Proximal neuropathy There is no test to diagnose this type of neuropathy. You may have tests to rule out other possible causes of this type of neuropathy. Tests may include:  X-rays of your spine and lumbar  region.  Lumbar puncture.  MRI. How is this treated? The goal of treatment is to keep nerve damage from getting worse. The most important part of treatment is keeping your blood glucose level and your A1C level within your target range by following your diabetes management plan. Over time, maintaining lower blood glucose levels helps lessen symptoms. In some cases, you may need prescription pain medicine. Follow these instructions at home:  Lifestyle   Do not use any products that contain nicotine or tobacco, such as cigarettes and e-cigarettes. If you need help quitting, ask your health care provider.  Be physically active every day. Include strength training and balance exercises.  Follow a healthy meal plan.  Work with your health care provider to manage your blood pressure. General instructions  Follow your diabetes management plan as directed. ? Check your blood glucose levels as directed by your health care provider. ? Keep your blood glucose in your target range as directed by your health care provider. ? Have your A1C level checked at least two times a year, or as often as told by your health care provider.  Take over the counter and prescription medicines only as told by your health care provider. This includes insulin and diabetes medicine.  Do not drive or use heavy machinery while taking prescription pain medicines.  Check your skin and feet every day for cuts, bruises, redness, blisters, or sores.  Keep all follow  up visits as told by your health care provider. This is important. Contact a health care provider if:  You have burning, stabbing, or aching pain in your legs or feet.  You are unable to feel pressure or pain in your feet.  You develop problems with digestion, such as: ? Nausea. ? Vomiting. ? Bloating. ? Constipation. ? Diarrhea. ? Abdominal pain.  You have difficulty with urination, such as inability: ? To control when you urinate  (incontinence). ? To completely empty the bladder (retention).  You have palpitations.  You feel dizzy, weak, or faint when you stand up. Get help right away if:  You cannot urinate.  You have sudden weakness or loss of coordination.  You have trouble speaking.  You have pain or pressure in your chest.  You have an irregular heart beat.  You have sudden inability to move a part of your body. Summary  Diabetic neuropathy refers to nerve damage that is caused by diabetes. It can affect nerves throughout the entire body, causing numbness and pain in the arms, legs, digestive tract, heart, and other body systems.  Keep your blood glucose level and your blood pressure in your target range, as directed by your health care provider. This can help prevent neuropathy from getting worse.  Check your skin and feet every day for cuts, bruises, redness, blisters, or sores.  Do not use any products that contain nicotine or tobacco, such as cigarettes and e-cigarettes. If you need help quitting, ask your health care provider. This information is not intended to replace advice given to you by your health care provider. Make sure you discuss any questions you have with your health care provider. Document Revised: 03/11/2017 Document Reviewed: 03/03/2016 Elsevier Patient Education  2020 [employer].

## 2019-08-08 ENCOUNTER — Ambulatory Visit (INDEPENDENT_AMBULATORY_CARE_PROVIDER_SITE_OTHER): Payer: Medicare Other

## 2019-08-08 ENCOUNTER — Telehealth: Payer: Self-pay

## 2019-08-08 ENCOUNTER — Other Ambulatory Visit: Payer: Self-pay

## 2019-08-08 DIAGNOSIS — N183 Chronic kidney disease, stage 3 unspecified: Secondary | ICD-10-CM

## 2019-08-08 DIAGNOSIS — E1121 Type 2 diabetes mellitus with diabetic nephropathy: Secondary | ICD-10-CM

## 2019-08-08 DIAGNOSIS — E1122 Type 2 diabetes mellitus with diabetic chronic kidney disease: Secondary | ICD-10-CM

## 2019-08-08 DIAGNOSIS — N1832 Chronic kidney disease, stage 3b: Secondary | ICD-10-CM | POA: Diagnosis not present

## 2019-08-08 DIAGNOSIS — Z794 Long term (current) use of insulin: Secondary | ICD-10-CM

## 2019-08-08 DIAGNOSIS — I129 Hypertensive chronic kidney disease with stage 1 through stage 4 chronic kidney disease, or unspecified chronic kidney disease: Secondary | ICD-10-CM

## 2019-08-09 ENCOUNTER — Other Ambulatory Visit: Payer: Self-pay

## 2019-08-09 MED ORDER — TRIAMCINOLONE ACETONIDE 0.1 % EX CREA: 1.0000 "application " | TOPICAL_CREAM | Freq: Two times a day (BID) | CUTANEOUS | 1 refills | Status: AC

## 2019-08-10 ENCOUNTER — Other Ambulatory Visit: Payer: Self-pay

## 2019-08-10 MED ORDER — GABAPENTIN 100 MG PO CAPS: ORAL_CAPSULE | ORAL | 0 refills | Status: DC

## 2019-08-11 NOTE — Patient Instructions (Signed)
Visit Information  Goals Addressed      Patient Stated     "to get better blood pressure control" (pt-stated)        CARE PLAN ENTRY (see longitudinal plan of care for additional care plan information)  Current Barriers:   Knowledge Deficits related to disease process and Self Health management of HTN  Chronic Disease Management support and education needs related to DM, CKD III, Hypertensive nephropathy  Nurse Case Manager Clinical Goal(s):   Over the next 90 days, patient will work with the CCM team to address needs related to disease education and support to help improve Self Health management of HTN  CCM RN CM Interventions:   Inter-disciplinary care team collaboration (see longitudinal plan of care)  Evaluation of current treatment plan related to HTN and patient's adherence to plan as established by provider.  Provided education to patient re: target BP <130/80; Educated on ways to improve Self Health management of HTN by adhering to heart healthy, low Sodium diet, taking medications exactly as prescribed and implementing a routine exercise regimen, 150 minutes per week as tolerated  Reviewed medications with patient and discussed Dr. Baird Cancer added Hydralazine HCL 25 mg, one tablet twice daily, patient is Self Checking and recording BP's  Determined patient will provide BP readings at next PCP nurse visit scheduled for 2 weeks  Advised patient, providing education and rationale, to monitor blood pressure daily and record, calling the CCM team and PCP for findings outside established parameters  Discussed plans with patient for ongoing care management follow up and provided patient with direct contact information for care management team  Provided patient with printed educational materials related to What is High Blood Pressure?; Why Should I Lower Sodium?; African Americans and High Blood Pressure; High Blood Pressure and Stroke  Patient Self Care Activities:   Self  administers medications as prescribed  Attends all scheduled provider appointments  Calls pharmacy for medication refills  Performs ADL's independently  Performs IADL's independently  Calls provider office for new concerns or questions  Initial goal documentation       Patient verbalizes understanding of instructions provided today.   Telephone follow up appointment with care management team member scheduled for: 10/05/19  Barb Merino, RN, BSN, CCM Care Management Coordinator [facility]  Direct Phone: [phone]

## 2019-08-11 NOTE — Chronic Care Management (AMB) (Signed)
Chronic Care Management   Follow Up Note   08/09/2019 Name: Teresa Stanley MRN: [id number] DOB: 04-26-1945  Referred by: [name], MD Reason for referral : Chronic Care Management (FU RN CM Call - DM/CKD )   Teresa Stanley is a 74 y.o. year old female who is a primary care patient of [name], MD. The CCM team was consulted for assistance with chronic disease management and care coordination needs.    Review of patient status, including review of consultants reports, relevant laboratory and other test results, and collaboration with appropriate care team members and the patient's provider was performed as part of comprehensive patient evaluation and provision of chronic care management services.    SDOH (Social Determinants of Health) assessments performed: Yes - no acute challenges  See Care Plan activities for detailed interventions related to [place])   Placed outbound CCM RN CM care plan follow up call to patient.     Outpatient Encounter Medications as of 08/08/2019  Medication Sig  . amLODipine (NORVASC) 10 MG tablet TAKE 1 TABLET BY MOUTH EVERY DAY IN THE EVENING  . aspirin 81 MG tablet Take 81 mg by mouth daily.  [name] atorvastatin (LIPITOR) 20 MG tablet TAKE 1 TABLET BY ORAL ROUTE EVERY DAY  . Blood Glucose Monitoring Suppl (ONE TOUCH ULTRA 2) w/Device KIT Use as directed to check blood sugars 2 times per day dx:e11.22  . carboxymethylcellulose (REFRESH PLUS) 0.5 % SOLN Place 1 drop into both eyes daily.  . cholecalciferol (VITAMIN D3) 25 MCG (1000 UNIT) tablet Take 1,000 Units by mouth daily.  [name] glucose blood (ONE TOUCH ULTRA TEST) test strip Use as instructed to check blood sugars 2 times per day dx: e11.22  . hydrALAZINE (APRESOLINE) 25 MG tablet One tab po twice daily  . insulin degludec (TRESIBA FLEXTOUCH) 100 UNIT/ML SOPN FlexTouch Pen Inject 16 Units into the skin at bedtime.   . meclizine (ANTIVERT) 25 MG tablet TAKE 1 TABLET (25 MG TOTAL) BY MOUTH 3 (THREE) TIMES  DAILY AS NEEDED FOR DIZZINESS.  . methimazole (TAPAZOLE) 5 MG tablet TAKE 1 TABLET BY MOUTH EVERY DAY  . omeprazole (PRILOSEC) 20 MG capsule TAKE ONE CAPSULE BY MOUTH BEFORE A MEAL  . OneTouch Delica Lancets 78G MISC Use as directed to check blood sugars 2 times per day dx: e11.22  . propranolol ER (INDERAL LA) 120 MG 24 hr capsule TAKE 1 CAPSULE BY MOUTH EVERY DAY  . Semaglutide,0.25 or 0.'5MG'$ /DOS, (OZEMPIC, 0.25 OR 0.5 MG/DOSE,) 2 MG/1.5ML SOPN Inject 0.5 mg into the skin once a week. (Patient taking differently: Inject 0.5 mg into the skin once a week. Pt states she takes 0.25 on Wednesday and 0.5 on Sunday)  . triamterene-hydrochlorothiazide (MAXZIDE-25) 37.5-25 MG tablet TAKE 1 TABLET BY MOUTH EVERY DAY IN THE MORNING  . [DISCONTINUED] gabapentin (NEURONTIN) 100 MG capsule One capsule po qhs  . [DISCONTINUED] triamcinolone cream (KENALOG) 0.1 % 1 application daily as needed. (Patient not taking: Reported on 08/03/2019)   No facility-administered encounter medications on file as of 08/08/2019.     Objective:  Lab Results  Component Value Date   HGBA1C 8.9 (H) 06/16/2019   HGBA1C 9.7 (H) 03/17/2019   HGBA1C 8.6 (H) 12/14/2018   Lab Results  Component Value Date   MICROALBUR 80 06/16/2019   LDLCALC 80 03/17/2019   CREATININE 1.70 (H) 06/16/2019   BP Readings from Last 3 Encounters:  08/04/19 (!) 160/92  06/16/19 (!) 172/96  03/17/19 118/74    Goals Addressed  Patient Stated   .  "to get better blood pressure control" (pt-stated)        CARE PLAN ENTRY (see longitudinal plan of care for additional care plan information)  Current Barriers:  [name] Knowledge Deficits related to disease process and Self Health management of HTN . Chronic Disease Management support and education needs related to DM, CKD III, Hypertensive nephropathy  Nurse Case Manager Clinical Goal(s):  [name] Over the next 90 days, patient will work with the CCM team to address needs related to disease education and  support to help improve Self Health management of HTN  CCM RN CM Interventions:  . Inter-disciplinary care team collaboration (see longitudinal plan of care) . Evaluation of current treatment plan related to HTN and patient's adherence to plan as established by provider. . Provided education to patient re: target BP <130/80; Educated on ways to improve Self Health management of HTN by adhering to heart healthy, low Sodium diet, taking medications exactly as prescribed and implementing a routine exercise regimen, 150 minutes per week as tolerated . Reviewed medications with patient and discussed Dr. Baird Cancer added Hydralazine HCL 25 mg, one tablet twice daily, patient is Self Checking and recording BP's . Determined patient will provide BP readings at next PCP nurse visit scheduled for 2 weeks . Advised patient, providing education and rationale, to monitor blood pressure daily and record, calling the CCM team and PCP for findings outside established parameters . Discussed plans with patient for ongoing care management follow up and provided patient with direct contact information for care management team . Provided patient with printed educational materials related to What is High Blood Pressure?; Why Should I Lower Sodium?; African Americans and High Blood Pressure; High Blood Pressure and Stroke  Patient Self Care Activities:  . Self administers medications as prescribed . Attends all scheduled provider appointments . Calls pharmacy for medication refills . Performs ADL's independently . Performs IADL's independently . Calls provider office for new concerns or questions  Initial goal documentation       Plan:   Telephone follow up appointment with care management team member scheduled for: 10/05/19  Barb Merino, RN, BSN, CCM Care Management Coordinator [place] [facility]  Direct Phone: [phone]

## 2019-08-23 ENCOUNTER — Other Ambulatory Visit: Payer: Self-pay

## 2019-08-23 ENCOUNTER — Ambulatory Visit: Payer: Medicare Other

## 2019-08-23 VITALS — BP 148/86 | HR 90 | Temp 97.7°F | Ht 61.6 in | Wt 268.4 lb

## 2019-08-23 DIAGNOSIS — I129 Hypertensive chronic kidney disease with stage 1 through stage 4 chronic kidney disease, or unspecified chronic kidney disease: Secondary | ICD-10-CM

## 2019-08-23 NOTE — Progress Notes (Addendum)
Pt presents today for b/p check she is currently on amlodipine 10mg , hydralazine 25 mg, triamterene hydrochlorothiazide 37.5-25 MG 148/86 at home pt states her b/p was 128/65  Last appt pt b/p was 160/92  Sent information to provider   Per RS: she can continue with current regimen. tel her hydralazine breakfast and dinner. be sure to exercise - move more during the day.

## 2019-08-26 ENCOUNTER — Other Ambulatory Visit: Payer: Self-pay | Admitting: Internal Medicine

## 2019-08-29 NOTE — Patient Instructions (Signed)
Visit Information  Goals Addressed            This Visit's Progress   . Pharmacy Care Plan       CARE PLAN ENTRY (see longitudinal plan of care for additional care plan information)  Current Barriers:  . Chronic Disease Management support, education, and care coordination needs related to Hypertension, Hyperlipidemia, and Diabetes   Hypertension BP Readings from Last 3 Encounters:  08/23/19 (!) 148/86  08/04/19 (!) 160/92  06/16/19 (!) 172/96   . Pharmacist Clinical Goal(s): o Over the next 90 days, patient will work with PharmD and providers to achieve BP goal <130/80 . Current regimen:   Amlodipine 10mg  daily  Triamterene/ HCTZ 37.5mg /25mg  daily  Propranolol ER 120mg  daily  Hydralazine 25mg  twice daily . Interventions: o Provided dietary and exercise recommendations - Carbohydrate counting - Provided patient with "Diabetes and You" booklet o Encouraged increased water intake to 64 ounces daily . Patient self care activities - Over the next 90 days, patient will: o Check BP weekly, document, and provide at future appointments o Ensure daily salt intake < 2300 mg/day o Exercise 30 minutes daily 5 days per week  Hyperlipidemia Lab Results  Component Value Date/Time   LDLCALC 80 03/17/2019 10:41 AM   . Pharmacist Clinical Goal(s): o Over the next 90 days, patient will work with PharmD and providers to achieve LDL goal < 70 . Current regimen:  o Atorvastatin 20mg  daily . Interventions: o Provided dietary and exercise recommendations o Addressed cholesterol goals . Patient self care activities - Over the next 90 days, patient will: o Focus on a heart healthy diet o Exercise 30 minutes daily 5 days per week  Diabetes Lab Results  Component Value Date/Time   HGBA1C 8.9 (H) 06/16/2019 05:02 PM   HGBA1C 9.7 (H) 03/17/2019 10:41 AM   . Pharmacist Clinical Goal(s): o Over the next 90 days, patient will work with PharmD and providers to achieve A1c goal  <7% . Current regimen:  o Tresiba 16 units daily o Ozempic 0.5mg  once weekly- per patient taking a total of 0.75mg  weekly on 2 separate days . Interventions: o Discuss increasing Ozempic to 1mg  daily with PCP . Patient self care activities - Over the next 90 days, patient will: o Check blood sugar once daily, document, and provide at future appointments o Contact provider with any episodes of hypoglycemia  Medication management . Pharmacist Clinical Goal(s): o Over the next 90 days, patient will work with PharmD and providers to maintain optimal medication adherence . Current pharmacy: [facility] . Interventions o Comprehensive medication review performed. o Continue current medication management strategy . Patient self care activities - Over the next 90 days, patient will: o Focus on medication adherence by continued use of pill box o Take medications as prescribed o Report any questions or concerns to PharmD and/or provider(s)  Initial goal documentation        Ms. Cange was given information about Chronic Care Management services today including:  1. CCM service includes personalized support from designated clinical staff supervised by her physician, including individualized plan of care and coordination with other care providers 2. 24/7 contact phone numbers for assistance for urgent and routine care needs. 3. Standard insurance, coinsurance, copays and deductibles apply for chronic care management only during months in which we provide at least 20 minutes of these services. Most insurances cover these services at 100%, however patients may be responsible for any copay, coinsurance and/or deductible if applicable. This service may  help you avoid the need for more expensive face-to-face services. 4. Only one practitioner may furnish and bill the service in a calendar month. 5. The patient may stop CCM services at any time (effective at the end of the month) by phone call to the office  staff.  Patient agreed to services and verbal consent obtained.   The patient verbalized understanding of instructions provided today and agreed to receive a mailed copy of patient instruction and/or educational materials. Telephone follow up appointment with pharmacy team member scheduled for: 10/12/19 @ [place], PharmD Clinical Pharmacist [facility] [phone]

## 2019-09-08 ENCOUNTER — Ambulatory Visit: Payer: Self-pay

## 2019-09-08 DIAGNOSIS — I129 Hypertensive chronic kidney disease with stage 1 through stage 4 chronic kidney disease, or unspecified chronic kidney disease: Secondary | ICD-10-CM

## 2019-09-08 DIAGNOSIS — Z794 Long term (current) use of insulin: Secondary | ICD-10-CM

## 2019-09-08 NOTE — Patient Instructions (Signed)
Visit Information  Goals Addressed            This Visit's Progress   . Medication Assistance       CARE PLAN ENTRY (see longitudinal plan of care for additional care plan information)  Current Barriers:  . Financial Barriers: patient has [employer] and reports copay for [employer] [name] is cost prohibitive at this time  Pharmacist Clinical Goal(s):  [name] Over the next 30 days, patient will work with PharmD and providers to relieve medication access concerns  Interventions: . Comprehensive medication review completed; medication list updated in electronic medical record.  Bertram Savin care team collaboration (see longitudinal plan of care) . [name] and Ozempic by [employer]: Patient meets income criteria for this medication's patient assistance program. Both medications have been approved through 02/10/20. Collaborated with primary care provider Dr. [name] for their portion of application. . Completed application faxed to [employer] patient assistance program on 09/08/19  Patient Self Care Activities:  . Pick up samples of Tresiba at the office on Monday 8/2  Initial goal documentation        Patient verbalizes understanding of instructions provided today.   Telephone follow up appointment with pharmacy team member scheduled for: 10/12/19 @ 4 PM  [name], PharmD Clinical Pharmacist [facility] [phone]

## 2019-09-08 NOTE — Chronic Care Management (AMB) (Signed)
   Chronic Care Management Pharmacy  Name: Teresa Stanley  MRN: [id number] DOB: March 29, 1945  PCP : [name], MD  Refill request/ reorder form completed and faxed to [employer] patient assistance program for [place] and Ozempic. Requested increased strength of Ozempic 1mg  weekly dose after collaboration with PCP about increasing dose. Pt called and notified of refill status. Pt stated that she has enough [place] to last until next Wednesday. Advised pt that I will put a sample aside for her to pick up Monday for the [place].   SDOH Interventions     Most Recent Value  SDOH Interventions  Financial Strain Interventions Other (Comment)  [Placed refill requests for [place] and Ozempic with [employer] patient assistance program]     Goals Addressed            This Visit's Progress   . Medication Assistance       CARE PLAN ENTRY (see longitudinal plan of care for additional care plan information)  Current Barriers:  . Financial Barriers: patient has [employer] and reports copay for [employer] [name] is cost prohibitive at this time  Pharmacist Clinical Goal(s):  [name] Over the next 30 days, patient will work with PharmD and providers to relieve medication access concerns  Interventions: . Comprehensive medication review completed; medication list updated in electronic medical record.  Bertram Savin care team collaboration (see longitudinal plan of care) . [name] and Ozempic by [employer]: Patient meets income criteria for this medication's patient assistance program. Both medications have been approved through 02/10/20. Collaborated with primary care provider Dr. [name] for their portion of application. . Completed application faxed to [employer] patient assistance program on 09/08/19  Patient Self Care Activities:  . Pick up samples of T[place]iba at the office on Monday 8/2  Initial goal documentation        [name],  PharmD Clinical Pharmacist [facility] [phone]

## 2019-09-26 ENCOUNTER — Other Ambulatory Visit: Payer: Self-pay | Admitting: Internal Medicine

## 2019-09-29 ENCOUNTER — Other Ambulatory Visit: Payer: Self-pay | Admitting: Internal Medicine

## 2019-09-30 ENCOUNTER — Telehealth: Payer: Self-pay

## 2019-09-30 NOTE — Telephone Encounter (Signed)
Informed pt that her needles from PAP was ready for pickup

## 2019-10-05 ENCOUNTER — Telehealth: Payer: Medicare Other

## 2019-10-05 ENCOUNTER — Other Ambulatory Visit: Payer: Self-pay

## 2019-10-05 ENCOUNTER — Ambulatory Visit (INDEPENDENT_AMBULATORY_CARE_PROVIDER_SITE_OTHER): Payer: Medicare Other

## 2019-10-05 DIAGNOSIS — E1121 Type 2 diabetes mellitus with diabetic nephropathy: Secondary | ICD-10-CM | POA: Diagnosis not present

## 2019-10-05 DIAGNOSIS — N1832 Chronic kidney disease, stage 3b: Secondary | ICD-10-CM

## 2019-10-05 DIAGNOSIS — Z794 Long term (current) use of insulin: Secondary | ICD-10-CM | POA: Diagnosis not present

## 2019-10-05 DIAGNOSIS — I129 Hypertensive chronic kidney disease with stage 1 through stage 4 chronic kidney disease, or unspecified chronic kidney disease: Secondary | ICD-10-CM | POA: Diagnosis not present

## 2019-10-08 ENCOUNTER — Other Ambulatory Visit: Payer: Self-pay | Admitting: Internal Medicine

## 2019-10-10 ENCOUNTER — Telehealth: Payer: Self-pay

## 2019-10-10 NOTE — Chronic Care Management (AMB) (Signed)
Chronic Care Management   Follow Up Note   10/05/2019 Name: Teresa Stanley MRN: [id number] DOB: [birth year]  Referred by: Teresa Chard, MD Reason for referral : Chronic Care Management (FU RN CM Call )   Teresa Stanley is a 74 y.o. year old female who is a primary care patient of Teresa Chard, MD. The CCM team was consulted for assistance with chronic disease management and care coordination needs.    Review of patient status, including review of consultants reports, relevant laboratory and other test results, and collaboration with appropriate care team members and the patient's provider was performed as part of comprehensive patient evaluation and provision of chronic care management services.    SDOH (Social Determinants of Health) assessments performed: Yes - no acute challenges identified  See Care Plan activities for detailed interventions related to [place])   Placed outbound CCM RN CM call for a BP update.     Outpatient Encounter Medications as of 10/05/2019  Medication Sig  . aspirin 81 MG tablet Take 81 mg by mouth daily.  [name] atorvastatin (LIPITOR) 20 MG tablet TAKE 1 TABLET BY ORAL ROUTE EVERY DAY  . Blood Glucose Monitoring Suppl (ONE TOUCH ULTRA 2) w/Device KIT Use as directed to check blood sugars 2 times per day dx:e11.22  . carboxymethylcellulose (REFRESH PLUS) 0.5 % SOLN Place 1 drop into both eyes daily.  . cholecalciferol (VITAMIN D3) 25 MCG (1000 UNIT) tablet Take 1,000 Units by mouth daily.  [name] gabapentin (NEURONTIN) 100 MG capsule Take 2 capsules po qhs  . glucose blood (ONE TOUCH ULTRA TEST) test strip Use as instructed to check blood sugars 2 times per day dx: e11.22  . hydrALAZINE (APRESOLINE) 25 MG tablet TAKE 1 TABLET BY MOUTH TWICE A DAY  . insulin degludec (TRESIBA FLEXTOUCH) 100 UNIT/ML SOPN FlexTouch Pen Inject 16 Units into the skin at bedtime.   . methimazole (TAPAZOLE) 5 MG tablet TAKE 1 TABLET BY MOUTH EVERY DAY  . omeprazole (PRILOSEC) 20 MG capsule  TAKE ONE CAPSULE BY MOUTH BEFORE A MEAL  . OneTouch Delica Lancets 92J MISC Use as directed to check blood sugars 2 times per day dx: e11.22  . propranolol ER (INDERAL LA) 120 MG 24 hr capsule TAKE 1 CAPSULE BY MOUTH EVERY DAY  . Semaglutide,0.25 or 0.5MG/DOS, (OZEMPIC, 0.25 OR 0.5 MG/DOSE,) 2 MG/1.5ML SOPN Inject 0.5 mg into the skin once a week. (Patient taking differently: Inject 0.5 mg into the skin once a week. Pt states she takes 0.25 on Wednesday and 0.5 on Sunday)  . triamcinolone cream (KENALOG) 0.1 % Apply 1 application topically 2 (two) times daily. Apply (Patient not taking: Reported on 08/29/2019)  . triamterene-hydrochlorothiazide (MAXZIDE-25) 37.5-25 MG tablet TAKE 1 TABLET BY MOUTH EVERY DAY IN THE MORNING  . [DISCONTINUED] amLODipine (NORVASC) 10 MG tablet TAKE 1 TABLET BY MOUTH EVERY DAY IN THE EVENING  . [DISCONTINUED] meclizine (ANTIVERT) 25 MG tablet TAKE 1 TABLET (25 MG TOTAL) BY MOUTH 3 (THREE) TIMES DAILY AS NEEDED FOR DIZZINESS.   No facility-administered encounter medications on file as of 10/05/2019.     Objective:  Lab Results  Component Value Date   HGBA1C 8.9 (H) 06/16/2019   HGBA1C 9.7 (H) 03/17/2019   HGBA1C 8.6 (H) 12/14/2018   Lab Results  Component Value Date   MICROALBUR 80 06/16/2019   LDLCALC 80 03/17/2019   CREATININE 1.70 (H) 06/16/2019   BP Readings from Last 3 Encounters:  08/23/19 (!) 148/86  08/04/19 (!) 160/92  06/16/19 [name])  172/96    Goals Addressed      Patient Stated   .  "to get better blood pressure control" (pt-stated)        CARE PLAN ENTRY (see longitudinal plan of care for additional care plan information)  Current Barriers:  [name] Knowledge Deficits related to disease process and Self Health management of HTN . Chronic Disease Management support and education needs related to DM, CKD III, Hypertensive nephropathy  Nurse Case Manager Clinical Goal(s):  [name] Over the next 90 days, patient will work with the CCM team to address  needs related to disease education and support to help improve Self Health management of HTN  CCM RN CM Interventions:  10/05/19 call completed with patient  . Inter-disciplinary care team collaboration (see longitudinal plan of care) . Evaluation of current treatment plan related to HTN and patient's adherence to plan as established by provider . Re-educated patient re: target BP <130/80; Educated on ways to improve Self Health management of HTN by adhering to heart healthy, low Sodium diet, taking medications exactly as prescribed and implementing a routine exercise regimen, 150 minutes per week as tolerated . Reviewed medications with patient, educated on importance of adherence, patient's current regimen; o amlodipine 30m, hydralazine 25 mg, triamterene hydrochlorothiazide 37.5-25 MG  . Determined patient continues to Self monitor her BP at home and is recording her Bp's, reports average BP has improved to 120's-140's over low 80's . Advised patient, providing education and rationale, to monitor blood pressure daily and record, calling the CCM team and PCP for findings outside established parameters . Discussed plans with patient for ongoing care management follow up and provided patient with direct contact information for care management team . Confirmed patient received/reviewed printed educational materials related to What is High Blood Pressure?; Why Should I Lower Sodium?; African Americans and High Blood Pressure; High Blood Pressure and Stroke  Patient Self Care Activities:  . Self administers medications as prescribed . Attends all scheduled provider appointments . Calls pharmacy for medication refills . Performs ADL's independently . Performs IADL's independently . Calls provider office for new concerns or questions  Please see past updates related to this goal by clicking on the "Past Updates" button in the selected goal        Plan:   Telephone follow up appointment with  care management team member scheduled for: 11/16/19  ABarb Merino RN, BSN, CCM Care Management Coordinator [place]  Direct Phone: [phone]

## 2019-10-10 NOTE — Chronic Care Management (AMB) (Signed)
    Chronic Care Management Pharmacy Assistant   Name: Teresa Stanley  MRN: [id number] DOB: [birth year]  Reason for Encounter: Medication Review / Patient Assistance Follow up  PCP : [name], MD  Allergies:  No Known Allergies  Medications: Outpatient Encounter Medications as of 10/10/2019  Medication Sig  . amLODipine (NORVASC) 10 MG tablet TAKE 1 TABLET BY MOUTH EVERY DAY IN THE EVENING  . aspirin 81 MG tablet Take 81 mg by mouth daily.  [name] atorvastatin (LIPITOR) 20 MG tablet TAKE 1 TABLET BY ORAL ROUTE EVERY DAY  . Blood Glucose Monitoring Suppl (ONE TOUCH ULTRA 2) w/Device KIT Use as directed to check blood sugars 2 times per day dx:e11.22  . carboxymethylcellulose (REFRESH PLUS) 0.5 % SOLN Place 1 drop into both eyes daily.  . cholecalciferol (VITAMIN D3) 25 MCG (1000 UNIT) tablet Take 1,000 Units by mouth daily.  [name] gabapentin (NEURONTIN) 100 MG capsule Take 2 capsules po qhs  . glucose blood (ONE TOUCH ULTRA TEST) test strip Use as instructed to check blood sugars 2 times per day dx: e11.22  . hydrALAZINE (APRESOLINE) 25 MG tablet TAKE 1 TABLET BY MOUTH TWICE A DAY  . insulin degludec (TRESIBA FLEXTOUCH) 100 UNIT/ML SOPN FlexTouch Pen Inject 16 Units into the skin at bedtime.   . meclizine (ANTIVERT) 25 MG tablet TAKE 1 TABLET (25 MG TOTAL) BY MOUTH 3 (THREE) TIMES DAILY AS NEEDED FOR DIZZINESS.  . methimazole (TAPAZOLE) 5 MG tablet TAKE 1 TABLET BY MOUTH EVERY DAY  . omeprazole (PRILOSEC) 20 MG capsule TAKE ONE CAPSULE BY MOUTH BEFORE A MEAL  . OneTouch Delica Lancets 78G MISC Use as directed to check blood sugars 2 times per day dx: e11.22  . propranolol ER (INDERAL LA) 120 MG 24 hr capsule TAKE 1 CAPSULE BY MOUTH EVERY DAY  . Semaglutide,0.25 or 0.5MG/DOS, (OZEMPIC, 0.25 OR 0.5 MG/DOSE,) 2 MG/1.5ML SOPN Inject 0.5 mg into the skin once a week. (Patient taking differently: Inject 0.5 mg into the skin once a week. Pt states she takes 0.25 on Wednesday and 0.5 on Sunday)  .  triamcinolone cream (KENALOG) 0.1 % Apply 1 application topically 2 (two) times daily. Apply (Patient not taking: Reported on 08/29/2019)  . triamterene-hydrochlorothiazide (MAXZIDE-25) 37.5-25 MG tablet TAKE 1 TABLET BY MOUTH EVERY DAY IN THE MORNING   No facility-administered encounter medications on file as of 10/10/2019.    Current Diagnosis: Patient Active Problem List   Diagnosis Date Noted  . Class 3 severe obesity due to excess calories with serious comorbidity and body mass index (BMI) of 45.0 to 49.9 in adult ([place]) 05/28/2018  . Hypertensive nephropathy 08/11/2017  . Right knee DJD 11/26/2015  . S/P total knee arthroplasty 04/18/2013  . Unilateral primary osteoarthritis, right knee 04/04/2013    Class: Diagnosis of  . [place] intertrigo 08/09/2012  . Hypertension 12/15/2011  . Diabetes mellitus with stage 3 chronic kidney disease ([place]) 12/15/2011     Follow-Up:  Patient Buckner, CPA called [employer] to follow up on patient's Ozempic medication. Per representative fax form from 09/05/19 was not received completely, there was a cut off where the providers signature and date should be. Refaxing form for processing. [name], CPP notified.  [name], [place] Pharmacist Assistant [phone]

## 2019-10-10 NOTE — Patient Instructions (Addendum)
Visit Information  Goals Addressed      Patient Stated   .  "to get better blood pressure control" (pt-stated)        CARE PLAN ENTRY (see longitudinal plan of care for additional care plan information)  Current Barriers:  [name] Knowledge Deficits related to disease process and Self Health management of HTN . Chronic Disease Management support and education needs related to DM, CKD III, Hypertensive nephropathy  Nurse Case Manager Clinical Goal(s):  [name] Over the next 90 days, patient will work with the CCM team to address needs related to disease education and support to help improve Self Health management of HTN  CCM RN CM Interventions:  10/05/19 call completed with patient  . Inter-disciplinary care team collaboration (see longitudinal plan of care) . Evaluation of current treatment plan related to HTN and patient's adherence to plan as established by provider . Re-educated patient re: target BP <130/80; Educated on ways to improve Self Health management of HTN by adhering to heart healthy, low Sodium diet, taking medications exactly as prescribed and implementing a routine exercise regimen, 150 minutes per week as tolerated . Reviewed medications with patient, educated on importance of adherence, patient's current regimen; o amlodipine 10mg , hydralazine 25 mg, triamterene hydrochlorothiazide 37.5-25 MG  . Determined patient continues to Self monitor her BP at home and is recording her Bp's, reports average BP has improved to 120's-140's over low 80's . Advised patient, providing education and rationale, to monitor blood pressure daily and record, calling the CCM team and PCP for findings outside established parameters . Discussed plans with patient for ongoing care management follow up and provided patient with direct contact information for care management team . Confirmed patient received/reviewed printed educational materials related to What is High Blood Pressure?; Why Should I Lower  Sodium?; African Americans and High Blood Pressure; High Blood Pressure and Stroke  Patient Self Care Activities:  . Self administers medications as prescribed . Attends all scheduled provider appointments . Calls pharmacy for medication refills . Performs ADL's independently . Performs IADL's independently . Calls provider office for new concerns or questions  Please see past updates related to this goal by clicking on the "Past Updates" button in the selected goal       Patient verbalizes understanding of instructions provided today.   Telephone follow up appointment with care management team member scheduled for: 11/16/19  Barb Merino, RN, BSN, CCM Care Management Coordinator [place] [facility]  Direct Phone: [phone]

## 2019-10-11 DIAGNOSIS — N1832 Chronic kidney disease, stage 3b: Secondary | ICD-10-CM | POA: Diagnosis not present

## 2019-10-11 DIAGNOSIS — E875 Hyperkalemia: Secondary | ICD-10-CM | POA: Diagnosis not present

## 2019-10-11 DIAGNOSIS — R809 Proteinuria, unspecified: Secondary | ICD-10-CM | POA: Diagnosis not present

## 2019-10-11 DIAGNOSIS — I129 Hypertensive chronic kidney disease with stage 1 through stage 4 chronic kidney disease, or unspecified chronic kidney disease: Secondary | ICD-10-CM | POA: Diagnosis not present

## 2019-10-11 DIAGNOSIS — D631 Anemia in chronic kidney disease: Secondary | ICD-10-CM | POA: Diagnosis not present

## 2019-10-12 ENCOUNTER — Ambulatory Visit: Payer: Self-pay

## 2019-10-12 ENCOUNTER — Other Ambulatory Visit: Payer: Self-pay

## 2019-10-12 DIAGNOSIS — Z794 Long term (current) use of insulin: Secondary | ICD-10-CM

## 2019-10-12 DIAGNOSIS — I129 Hypertensive chronic kidney disease with stage 1 through stage 4 chronic kidney disease, or unspecified chronic kidney disease: Secondary | ICD-10-CM

## 2019-10-12 NOTE — Patient Instructions (Addendum)
Visit Information  Goals Addressed            This Visit's Progress   . Pharmacy Care Plan       CARE PLAN ENTRY (see longitudinal plan of care for additional care plan information)  Current Barriers:  . Chronic Disease Management support, education, and care coordination needs related to Hypertension, Hyperlipidemia, and Diabetes   Hypertension BP Readings from Last 3 Encounters:  08/23/19 (!) 148/86  08/04/19 (!) 160/92  06/16/19 (!) 172/96   . Pharmacist Clinical Goal(s): o Over the next 90 days, patient will work with PharmD and providers to achieve BP goal <130/80 . Current regimen:   Amlodipine 10mg  daily  Triamterene/ HCTZ 37.5mg /25mg  daily  Propranolol ER 120mg  daily  Hydralazine 25mg  twice daily . Interventions: o Provided dietary and exercise recommendations o Patient stated that triamterene/HCTZ changed to chlorthalidone by Nephrologist due to hyperkalemia - Updated medication list o Encouraged increased water intake to 64 ounces daily . Patient self care activities - Over the next 90 days, patient will: o Check BP daily, document, and provide at future appointments o Ensure daily salt intake < 2300 mg/day o Exercise 30 minutes daily 5 days per week  Hyperlipidemia Lab Results  Component Value Date/Time   LDLCALC 80 03/17/2019 10:41 AM   . Pharmacist Clinical Goal(s): o Over the next 90 days, patient will work with PharmD and providers to achieve LDL goal < 70 . Current regimen:  o Atorvastatin 20mg  daily . Interventions: o Provided dietary and exercise recommendations o Discussed appropriate goals for LDL (less than 70) . Patient self care activities - Over the next 90 days, patient will: o Focus on a heart healthy diet o Exercise 30 minutes daily 5 days per week  Diabetes Lab Results  Component Value Date/Time   HGBA1C 8.9 (H) 06/16/2019 05:02 PM   HGBA1C 9.7 (H) 03/17/2019 10:41 AM   . Pharmacist Clinical Goal(s): o Over the next 90 days,  patient will work with PharmD and providers to achieve A1c goal <7% . Current regimen:  o Tresiba 16 units daily o Ozempic 0.5mg  once weekly- per patient taking a total of 0.75mg  weekly on 2 separate days . Interventions: o Resubmitted patient assistance refill form for Ozempic o Discussed with patient that when shipment arrives it will be for Ozempic 1mg  weekly and she will only need to do 1 shot instead of 2 o Provided dietary and exercise recommendations . Patient self care activities - Over the next 90 days, patient will: o Check blood sugar once daily, document, and provide at future appointments o Contact provider with any episodes of hypoglycemia o Exercise 30 minutes daily 5 days per week  Medication management . Pharmacist Clinical Goal(s): o Over the next 90 days, patient will work with PharmD and providers to maintain optimal medication adherence . Current pharmacy: [facility] . Interventions o Comprehensive medication review performed. o Continue current medication management strategy . Patient self care activities - Over the next 90 days, patient will: o Focus on medication adherence by continued use of pill box o Take medications as prescribed o Report any questions or concerns to PharmD and/or provider(s)  Please see past updates related to this goal by clicking on the "Past Updates" button in the selected goal         The patient verbalized understanding of instructions provided today and agreed to receive a mailed copy of patient instruction and/or educational materials.  Telephone follow up appointment with pharmacy team member scheduled  for:12/15/19 @ 4:15 PM  [name], PharmD Clinical Pharmacist [facility] [phone]   Diabetes Mellitus and Nutrition, Adult When you have diabetes (diabetes mellitus), it is very important to have healthy eating habits because your blood sugar (glucose) levels are greatly affected by what you eat  and drink. Eating healthy foods in the appropriate amounts, at about the same times every day, can help you:  Control your blood glucose.  Lower your risk of heart disease.  Improve your blood pressure.  Reach or maintain a healthy weight. Every person with diabetes is different, and each person has different needs for a meal plan. Your health care provider may recommend that you work with a diet and nutrition specialist (dietitian) to make a meal plan that is best for you. Your meal plan may vary depending on factors such as:  The calories you need.  The medicines you take.  Your weight.  Your blood glucose, blood pressure, and cholesterol levels.  Your activity level.  Other health conditions you have, such as heart or kidney disease. How do carbohydrates affect me? Carbohydrates, also called carbs, affect your blood glucose level more than any other type of food. Eating carbs naturally raises the amount of glucose in your blood. Carb counting is a method for keeping track of how many carbs you eat. Counting carbs is important to keep your blood glucose at a healthy level, especially if you use insulin or take certain oral diabetes medicines. It is important to know how many carbs you can safely have in each meal. This is different for every person. Your dietitian can help you calculate how many carbs you should have at each meal and for each snack. Foods that contain carbs include:  Bread, cereal, rice, pasta, and crackers.  Potatoes and corn.  Peas, beans, and lentils.  Milk and yogurt.  Fruit and juice.  Desserts, such as cakes, cookies, ice cream, and candy. How does alcohol affect me? Alcohol can cause a sudden decrease in blood glucose (hypoglycemia), especially if you use insulin or take certain oral diabetes medicines. Hypoglycemia can be a life-threatening condition. Symptoms of hypoglycemia (sleepiness, dizziness, and confusion) are similar to symptoms of having too  much alcohol. If your health care provider says that alcohol is safe for you, follow these guidelines:  Limit alcohol intake to no more than 1 drink per day for nonpregnant women and 2 drinks per day for men. One drink equals 12 oz of beer, 5 oz of wine, or 1 oz of hard liquor.  Do not drink on an empty stomach.  Keep yourself hydrated with water, diet soda, or unsweetened iced tea.  Keep in mind that regular soda, juice, and other mixers may contain a lot of sugar and must be counted as carbs. What are tips for following this plan?  Reading food labels  Start by checking the serving size on the "Nutrition Facts" label of packaged foods and drinks. The amount of calories, carbs, fats, and other nutrients listed on the label is based on one serving of the item. Many items contain more than one serving per package.  Check the total grams (g) of carbs in one serving. You can calculate the number of servings of carbs in one serving by dividing the total carbs by 15. For example, if a food has 30 g of total carbs, it would be equal to 2 servings of carbs.  Check the number of grams (g) of saturated and trans fats in  one serving. Choose foods that have low or no amount of these fats.  Check the number of milligrams (mg) of salt (sodium) in one serving. Most people should limit total sodium intake to less than 2,300 mg per day.  Always check the nutrition information of foods labeled as "low-fat" or "nonfat". These foods may be higher in added sugar or refined carbs and should be avoided.  Talk to your dietitian to identify your daily goals for nutrients listed on the label. Shopping  Avoid buying canned, premade, or processed foods. These foods tend to be high in fat, sodium, and added sugar.  Shop around the outside edge of the grocery store. This includes fresh fruits and vegetables, bulk grains, fresh meats, and fresh dairy. Cooking  Use low-heat cooking methods, such as baking, instead  of high-heat cooking methods like deep frying.  Cook using healthy oils, such as olive, canola, or sunflower oil.  Avoid cooking with butter, cream, or high-fat meats. Meal planning  Eat meals and snacks regularly, preferably at the same times every day. Avoid going long periods of time without eating.  Eat foods high in fiber, such as fresh fruits, vegetables, beans, and whole grains. Talk to your dietitian about how many servings of carbs you can eat at each meal.  Eat 4-6 ounces (oz) of lean protein each day, such as lean meat, chicken, fish, eggs, or tofu. One oz of lean protein is equal to: ? 1 oz of meat, chicken, or fish. ? 1 egg. ?  cup of tofu.  Eat some foods each day that contain healthy fats, such as avocado, nuts, seeds, and fish. Lifestyle  Check your blood glucose regularly.  Exercise regularly as told by your health care provider. This may include: ? 150 minutes of moderate-intensity or vigorous-intensity exercise each week. This could be brisk walking, biking, or water aerobics. ? Stretching and doing strength exercises, such as yoga or weightlifting, at least 2 times a week.  Take medicines as told by your health care provider.  Do not use any products that contain nicotine or tobacco, such as cigarettes and e-cigarettes. If you need help quitting, ask your health care provider.  Work with a Social worker or diabetes educator to identify strategies to manage stress and any emotional and social challenges. Questions to ask a health care provider  Do I need to meet with a diabetes educator?  Do I need to meet with a dietitian?  What number can I call if I have questions?  When are the best times to check my blood glucose? Where to find more information:  American Diabetes Association: diabetes.org  Academy of Nutrition and Dietetics: www.eatright.[employer] of Diabetes and Digestive and Kidney Diseases (NIH): [employer] Summary  A  healthy meal plan will help you control your blood glucose and maintain a healthy lifestyle.  Working with a diet and nutrition specialist (dietitian) can help you make a meal plan that is best for you.  Keep in mind that carbohydrates (carbs) and alcohol have immediate effects on your blood glucose levels. It is important to count carbs and to use alcohol carefully. This information is not intended to replace advice given to you by your health care provider. Make sure you discuss any questions you have with your health care provider. Document Revised: 01/09/2017 Document Reviewed: 03/03/2016 Elsevier Patient Education  2020 [employer].

## 2019-10-12 NOTE — Chronic Care Management (AMB) (Signed)
Chronic Care Management Pharmacy  Name: Teresa Stanley  MRN: [id number] DOB: 1945/02/28  Chief Complaint/ HPI  Teresa Stanley,  74 y.o. , female presents for their Follow-Up CCM visit with the clinical pharmacist via telephone due to COVID-19 Pandemic.  PCP : [name], MD  Their chronic conditions include: Hypertension, Diabetes mellitus with CKD  Office Visits: 09/30/19 Telephone call: Pt notified needles from PAP ready for pickup  08/23/19 Nurse visit: BP 148/86, pt states BP was 128/65 at home. Continue current regimen. Hydralazine with breakfast and dinner. Exercise  08/04/19 OV: Started hydralazine 65m twice daily for blood pressure. Increase gabapentin to 2066mnightly. 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   07/18/19 Telephone call: Pt called with weekly BG readings (119,118, 112, 124, 134, 145). Pt's BG has improved and PCP told pt she does not need to call weekly anymore.   07/08/19 Telephone call: Pt called with weekly BG readings (149, 130, 134, 179, 140, 145, 143, 145) on 14 units of [place] instructed to increase to 16 units daily.   06/16/19 OV: Presented for full physical exam and DM and HTN follow up. Diabetic foot exam performed. Shingles vaccine declined at this time.   03/17/19 AWV and OV: HTN/DM follow up visit. Both conditions are chronic and fairly controlled on current medications. Labs ordered (CMP14+EGFR, lipid panel, HgbA1c). Pt will need to be started on insulin due to increase in A1c.   02/16/19 Televisit: Complains of symptoms concerning for COVID-19 (cough, sinus congestion). COVID test last Saturday was negative. Try loratadine 1025maily for postnasal drip.   Consult Visits:  CCM Encounters: 10/05/19 RN: reviewed BP and treatment plan  08/08/19 RN: Reviewed hypertension treatment regimen. Provided patient education  05/04/19 PharmD: Increasing [name] 10 units at bedtime.   04/25/19 RN: Provided patient education regarding chronic disease states  and treatment regimens  04/14/19 PharmD: [place] DrapeeTyler Aas 8 units at bedtime  04/14/19 CPhT: TreTyler Aasproved through [employer] 02/10/20  03/30/19 CPhT: completed application for Tresiba faxed to [employer]/15/21 PharmD: Adding TreTyler Aasunits at bedtime due to increase of A1c to 9.6. Add [name] pt assistance and counsel on proper use.   02/23/19 CPhT: Approved for Ozempic through [employer] 01/10/20  Medications: Outpatient Encounter Medications as of 10/12/2019  Medication Sig  . amLODipine (NORVASC) 10 MG tablet TAKE 1 TABLET BY MOUTH EVERY DAY IN THE EVENING  . aspirin 81 MG tablet Take 81 mg by mouth daily.  . aMarland Kitchenorvastatin (LIPITOR) 20 MG tablet TAKE 1 TABLET BY ORAL ROUTE EVERY DAY  . Blood Glucose Monitoring Suppl (ONE TOUCH ULTRA 2) w/Device KIT Use as directed to check blood sugars 2 times per day dx:e11.22  . carboxymethylcellulose (REFRESH PLUS) 0.5 % SOLN Place 1 drop into both eyes daily.  . chlorthalidone (HYGROTON) 25 MG tablet Take 25 mg by mouth daily.  . cholecalciferol (VITAMIN D3) 25 MCG (1000 UNIT) tablet Take 1,000 Units by mouth daily.  . famotidine (PEPCID) 20 MG tablet Take 20 mg by mouth daily.  . gMarland Kitchenbapentin (NEURONTIN) 100 MG capsule Take 2 capsules po qhs  . glucose blood (ONE TOUCH ULTRA TEST) test strip Use as instructed to check blood sugars 2 times per day dx: e11.22  . hydrALAZINE (APRESOLINE) 25 MG tablet TAKE 1 TABLET BY MOUTH TWICE A DAY  . insulin degludec (TRESIBA FLEXTOUCH) 100 UNIT/ML SOPN FlexTouch Pen Inject 16 Units into the skin at bedtime.   . meclizine (  ANTIVERT) 25 MG tablet TAKE 1 TABLET (25 MG TOTAL) BY MOUTH 3 (THREE) TIMES DAILY AS NEEDED FOR DIZZINESS.  . methimazole (TAPAZOLE) 5 MG tablet TAKE 1 TABLET BY MOUTH EVERY DAY  . OneTouch Delica Lancets 06T MISC Use as directed to check blood sugars 2 times per day dx: e11.22  . propranolol ER (INDERAL LA) 120 MG 24 hr capsule TAKE 1 CAPSULE BY MOUTH EVERY DAY  .  Semaglutide,0.25 or 0.5MG/DOS, (OZEMPIC, 0.25 OR 0.5 MG/DOSE,) 2 MG/1.5ML SOPN Inject 0.5 mg into the skin once a week. (Patient taking differently: Inject 0.5 mg into the skin once a week. Pt states she takes 0.25 on Wednesday and 0.5 on Sunday)  . omeprazole (PRILOSEC) 20 MG capsule TAKE ONE CAPSULE BY MOUTH BEFORE A MEAL (Patient not taking: Reported on 10/12/2019)  . triamcinolone cream (KENALOG) 0.1 % Apply 1 application topically 2 (two) times daily. Apply (Patient not taking: Reported on 08/29/2019)  . triamterene-hydrochlorothiazide (MAXZIDE-25) 37.5-25 MG tablet TAKE 1 TABLET BY MOUTH EVERY DAY IN THE MORNING (Patient not taking: Reported on 10/12/2019)   No facility-administered encounter medications on file as of 10/12/2019.    Current Diagnosis/Assessment:  SDOH Interventions     Most Recent Value  SDOH Interventions  Financial Strain Interventions Other (Comment)  [Will continue to assist with obtaining Ozempic and Tresiba through [employer] patient assistance program]      Goals Addressed            This Visit's Progress   . Pharmacy Care Plan       CARE PLAN ENTRY (see longitudinal plan of care for additional care plan information)  Current Barriers:  . Chronic Disease Management support, education, and care coordination needs related to Hypertension, Hyperlipidemia, and Diabetes   Hypertension BP Readings from Last 3 Encounters:  08/23/19 (!) 148/86  08/04/19 (!) 160/92  06/16/19 (!) 172/96   . Pharmacist Clinical Goal(s): o Over the next 90 days, patient will work with PharmD and providers to achieve BP goal <130/80 . Current regimen:   Amlodipine 71m daily  Triamterene/ HCTZ 37.54m15mg daily  Propranolol ER 12017maily  Hydralazine 15m29mice daily . Interventions: o Provided dietary and exercise recommendations o Patient stated that triamterene/HCTZ changed to chlorthalidone by Nephrologist due to hyperkalemia - Updated medication list o Encouraged  increased water intake to 64 ounces daily . Patient self care activities - Over the next 90 days, patient will: o Check BP daily, document, and provide at future appointments o Ensure daily salt intake < 2300 mg/day o Exercise 30 minutes daily 5 days per week  Hyperlipidemia Lab Results  Component Value Date/Time   LDLCALC 80 03/17/2019 10:41 AM   . Pharmacist Clinical Goal(s): o Over the next 90 days, patient will work with PharmD and providers to achieve LDL goal < 70 . Current regimen:  o Atorvastatin 20mg42mly . Interventions: o Provided dietary and exercise recommendations o Discussed appropriate goals for LDL (less than 70) . Patient self care activities - Over the next 90 days, patient will: o Focus on a heart healthy diet o Exercise 30 minutes daily 5 days per week  Diabetes Lab Results  Component Value Date/Time   HGBA1C 8.9 (H) 06/16/2019 05:02 PM   HGBA1C 9.7 (H) 03/17/2019 10:41 AM   . Pharmacist Clinical Goal(s): o Over the next 90 days, patient will work with PharmD and providers to achieve A1c goal <7% . Current regimen:  o Tresiba 16 units daily o Ozempic 0.5mg o12m weekly-  per patient taking a total of 0.38m weekly on 2 separate days . Interventions: o Resubmitted patient assistance refill form for Ozempic o Discussed with patient that when shipment arrives it will be for Ozempic 140mweekly and she will only need to do 1 shot instead of 2 o Provided dietary and exercise recommendations . Patient self care activities - Over the next 90 days, patient will: o Check blood sugar once daily, document, and provide at future appointments o Contact provider with any episodes of hypoglycemia o Exercise 30 minutes daily 5 days per week  Medication management . Pharmacist Clinical Goal(s): o Over the next 90 days, patient will work with PharmD and providers to maintain optimal medication adherence . [facility] . Interventions o Comprehensive medication  review performed. o Continue current medication management strategy . Patient self care activities - Over the next 90 days, patient will: o Focus on medication adherence by continued use of pill box o Take medications as prescribed o Report any questions or concerns to PharmD and/or provider(s)  Please see past updates related to this goal by clicking on the "Past Updates" button in the selected goal         Diabetes   Recent Relevant Labs: Lab Results  Component Value Date/Time   HGBA1C 8.9 (H) 06/16/2019 05:02 PM   HGBA1C 9.7 (H) 03/17/2019 10:41 AM   MICROALBUR 80 06/16/2019 02:39 PM   MICROALBUR 150 03/17/2019 11:53 AM    Checking BG: Daily  Recent FBG Readings: 115,118,130 Recent pre-meal BG readings:  Recent 2hr PP BG readings:   Recent HS BG readings:  Patient has failed these meds in past: Glipizide, Victoza, Metformin, pioglitazone Patient is currently uncontrolled on the following medications:   Ozempic 0.68m26mnce weekly  0.268m468mekly on Wednesdays, 0.68mg 69mkly on Sundays  Tresiba 16 units daily  Aspirin 81mg 38my  Last diabetic Foot exam: 12/14/18 Last diabetic Eye exam:  Lab Results  Component Value Date/Time   HMDIABEYEEXA No Retinopathy 04/01/2019 12:00 AM    We discussed:  Discussed patient assistance application resent for Ozempic due to [employer] original application was cut off before prescriber signature  Pt states that she has 2 doses left  Advised pt to contact PharmD or office for samples before she runs out  Discussed that [place] from [employer] for the 1mg in66mtion to be done once a week  Pt states she picked up [place] assistance shipment last week . Diet extensively o Pt states she has cut back on starches, bread, sweets, etc o Pt states she is going to start drinking 4 bottles of water daily (has been drinking water though) . Exercise extensively o Walking and using hands weights o Trying  to exercise every morning for about 30 minutes o Recommend pt get 30 minutes of moderate intensity exercise daily 5 times a week (150 minutes total per week)  Plan Continue current medications  Follow up on status of Ozempic 1mg ref39m with [employer]o Nordisk   Hypertension   Office blood pressures are  BP Readings from Last 3 Encounters:  08/23/19 (!) 148/86  08/04/19 (!) 160/92  06/16/19 (!) 172/96   Patient has failed these meds in the past: Chlorthalidone, Clonidine, Lisinopril, Metoprolol tartrate,  Patient is currently uncontrolled on the following medications:   Amlodipine 10mg dai81mTriamterene/ HCTZ 37.68mg/268mg 968mly  Propranolol ER 120mg daily82mdralazine 268mg twice 77my  Patient checks BP at home daily  Patient home BP  readings are ranging: 124/50  We discussed:   Pt states that triamterene/HCTZ was stopped and changed to something else yesterday by Nephrologist  After chart review appears that patient was changed to chlorthalidone 73m  Pt is going to pick up new medication and start today  Will follow up with Nephrologist in 2 months . Pt states potassium was high (she was also prescribed a medication for that and will have follow up labwork done on Friday) . Discussed triamterene was most likely stopped because it can cause hyperkalemia  Plan Continue current medications   Hyperlipidemia   LDL goal < 70  Lipid Panel     Component Value Date/Time   CHOL 162 03/17/2019 1041   TRIG 124 03/17/2019 1041   HDL 60 03/17/2019 1041   LDLCALC 80 03/17/2019 1041    Hepatic Function Latest Ref Rng & Units 06/16/2019 03/17/2019 06/02/2018  Total Protein 6.0 - 8.5 g/dL 7.4 7.1 6.7  Albumin 3.7 - 4.7 g/dL - 4.3 -  AST 0 - 40 IU/L - 15 -  ALT 0 - 32 IU/L - 10 -  Alk Phosphatase 39 - 117 IU/L - 175(H) -  Total Bilirubin 0.0 - 1.2 mg/dL - 0.4 -    The 10-year ASCVD risk score (Mikey BussingDC Jr., et al., 2013) is: 30.8%   Values used to calculate the score:     Age: [age] years     Sex: Female     Is Non-Hispanic African American: Yes     Diabetic: Yes     Tobacco smoker: No     Systolic Blood Pressure: 1921mmHg     Is BP treated: Yes     HDL Cholesterol: 60 mg/dL     Total Cholesterol: 162 mg/dL   Patient has failed these meds in past: N/A Patient is currently uncontrolled on the following medications:  . Atorvastatin 256mdaily  We discussed: Diet and exercise extensively  LDL above goal of 70 and HDL below goal of 50  Plan Continue current medications along with lifestyle modifications  Hyperthyroidism   Lab Results  Component Value Date/Time   TSH 0.005 (L) 08/23/2009 07:54 PM   TSH 0.022 (L) 11/14/2008 08:42 PM   FREET4 1.33 08/23/2009 07:54 PM   FREET4 1.32 11/14/2008 08:42 PM   Patient has failed these meds in past: N/A Patient is currently controlled on the following medications:  . Marland Kitchenethimazole 22m39maily  Plan Continue current medications   Diabetic Neuropathy   Patient has failed these meds in past: N/A Patient is currently controlled on the following medications:   Gabapentin 100m71mcapsules nightly  We discussed:  Gabapentin has helped with nerve pain  Pt has skipped a dose or two  Advised her that if she skis an occasional dose she may not notice any effect, but if she misses several doses she will notice the effect  Plan Continue current medications  Vaccines   Reviewed and discussed patient's vaccination history.    Immunization History  Administered Date(s) Administered  . Influenza, High Dose Seasonal PF 11/12/2017, 11/09/2018  . PFIZER SARS-COV-2 Vaccination 04/04/2019, 04/25/2019  . Pneumococcal Polysaccharide-23 11/29/2015   Discussed the importance of vaccinations (specifically pneumonia and shingles)  Plan Recommended patient receive Prevnar13 and Shingrix vaccines in pharmacy.   Medication Management   [facility] Uses pill box? Yes Pt endorses 100%  compliance  We discussed:   Importance of daily medication adherence  Medication synchronization, adherence packaging, or  delivery with [employer]  Not interested right now  Plan Continue current medication management strategy   Follow up: 2 month phone visit  [name], PharmD Clinical Pharmacist [facility] [phone]

## 2019-10-13 DIAGNOSIS — H353131 Nonexudative age-related macular degeneration, bilateral, early dry stage: Secondary | ICD-10-CM | POA: Diagnosis not present

## 2019-10-13 DIAGNOSIS — E119 Type 2 diabetes mellitus without complications: Secondary | ICD-10-CM | POA: Diagnosis not present

## 2019-10-13 DIAGNOSIS — H0288A Meibomian gland dysfunction right eye, upper and lower eyelids: Secondary | ICD-10-CM | POA: Diagnosis not present

## 2019-10-13 DIAGNOSIS — H04123 Dry eye syndrome of bilateral lacrimal glands: Secondary | ICD-10-CM | POA: Diagnosis not present

## 2019-10-14 DIAGNOSIS — E875 Hyperkalemia: Secondary | ICD-10-CM | POA: Diagnosis not present

## 2019-10-21 ENCOUNTER — Other Ambulatory Visit: Payer: Self-pay | Admitting: Nephrology

## 2019-10-21 DIAGNOSIS — N1832 Chronic kidney disease, stage 3b: Secondary | ICD-10-CM

## 2019-10-21 DIAGNOSIS — R809 Proteinuria, unspecified: Secondary | ICD-10-CM

## 2019-10-24 ENCOUNTER — Telehealth: Payer: Self-pay

## 2019-10-24 DIAGNOSIS — M542 Cervicalgia: Secondary | ICD-10-CM | POA: Diagnosis not present

## 2019-10-24 DIAGNOSIS — R000 Tachycardia, unspecified: Secondary | ICD-10-CM | POA: Diagnosis not present

## 2019-10-24 DIAGNOSIS — N08 Glomerular disorders in diseases classified elsewhere: Secondary | ICD-10-CM | POA: Diagnosis not present

## 2019-10-24 DIAGNOSIS — E119 Type 2 diabetes mellitus without complications: Secondary | ICD-10-CM | POA: Diagnosis not present

## 2019-10-24 DIAGNOSIS — H748X9 Other specified disorders of middle ear and mastoid, unspecified ear: Secondary | ICD-10-CM | POA: Diagnosis not present

## 2019-10-25 ENCOUNTER — Other Ambulatory Visit: Payer: Self-pay | Admitting: Internal Medicine

## 2019-10-25 NOTE — Chronic Care Management (AMB) (Signed)
    Chronic Care Management Pharmacy Assistant   Name: HALENA MOHAR  MRN: [id number] DOB: July 16, 1945  Reason for Encounter: Medication Review / Patient Assistance Coordination   PCP : [name], MD  Allergies:  No Known Allergies  Medications: Outpatient Encounter Medications as of 10/24/2019  Medication Sig  . amLODipine (NORVASC) 10 MG tablet TAKE 1 TABLET BY MOUTH EVERY DAY IN THE EVENING  . aspirin 81 MG tablet Take 81 mg by mouth daily.  [name] atorvastatin (LIPITOR) 20 MG tablet TAKE 1 TABLET BY ORAL ROUTE EVERY DAY  . Blood Glucose Monitoring Suppl (ONE TOUCH ULTRA 2) w/Device KIT Use as directed to check blood sugars 2 times per day dx:e11.22  . carboxymethylcellulose (REFRESH PLUS) 0.5 % SOLN Place 1 drop into both eyes daily.  . chlorthalidone (HYGROTON) 25 MG tablet Take 25 mg by mouth daily.  . cholecalciferol (VITAMIN D3) 25 MCG (1000 UNIT) tablet Take 1,000 Units by mouth daily.  . famotidine (PEPCID) 20 MG tablet Take 20 mg by mouth daily.  [name] gabapentin (NEURONTIN) 100 MG capsule Take 2 capsules po qhs  . glucose blood (ONE TOUCH ULTRA TEST) test strip Use as instructed to check blood sugars 2 times per day dx: e11.22  . hydrALAZINE (APRESOLINE) 25 MG tablet TAKE 1 TABLET BY MOUTH TWICE A DAY  . insulin degludec (TRESIBA FLEXTOUCH) 100 UNIT/ML SOPN FlexTouch Pen Inject 16 Units into the skin at bedtime.   . meclizine (ANTIVERT) 25 MG tablet TAKE 1 TABLET (25 MG TOTAL) BY MOUTH 3 (THREE) TIMES DAILY AS NEEDED FOR DIZZINESS.  . methimazole (TAPAZOLE) 5 MG tablet TAKE 1 TABLET BY MOUTH EVERY DAY  . omeprazole (PRILOSEC) 20 MG capsule TAKE ONE CAPSULE BY MOUTH BEFORE A MEAL (Patient not taking: Reported on 10/12/2019)  . OneTouch Delica Lancets 93Z MISC Use as directed to check blood sugars 2 times per day dx: e11.22  . propranolol ER (INDERAL LA) 120 MG 24 hr capsule TAKE 1 CAPSULE BY MOUTH EVERY DAY  . Semaglutide,0.25 or 0.5MG/DOS, (OZEMPIC, 0.25 OR 0.5 MG/DOSE,) 2  MG/1.5ML SOPN Inject 0.5 mg into the skin once a week. (Patient taking differently: Inject 0.5 mg into the skin once a week. Pt states she takes 0.25 on Wednesday and 0.5 on Sunday)  . triamcinolone cream (KENALOG) 0.1 % Apply 1 application topically 2 (two) times daily. Apply (Patient not taking: Reported on 08/29/2019)  . triamterene-hydrochlorothiazide (MAXZIDE-25) 37.5-25 MG tablet TAKE 1 TABLET BY MOUTH EVERY DAY IN THE MORNING (Patient not taking: Reported on 10/12/2019)   No facility-administered encounter medications on file as of 10/24/2019.    Current Diagnosis: Patient Active Problem List   Diagnosis Date Noted  . Class 3 severe obesity due to excess calories with serious comorbidity and body mass index (BMI) of 45.0 to 49.9 in adult ([place]) 05/28/2018  . Hypertensive nephropathy 08/11/2017  . Right knee DJD 11/26/2015  . S/P total knee arthroplasty 04/18/2013  . Unilateral primary osteoarthritis, right knee 04/04/2013    Class: Diagnosis of  . Candidal intertrigo 08/09/2012  . Hypertension 12/15/2011  . Diabetes mellitus with stage 3 chronic kidney disease ([place]) 12/15/2011      Follow-Up:  Patient Assistance [place] regarding patient assistance for [place] , spoke with Christinia Gully, was informed that 4 boxes of Ozempic was sent out on Sept 6,2021. Will be at providers office on or before Sept 24,2021.  [place] aware.  Judithann Sheen, [facility] Clinical Pharmacist Assistant [phone]

## 2019-10-26 ENCOUNTER — Other Ambulatory Visit: Payer: Self-pay

## 2019-10-26 NOTE — Telephone Encounter (Signed)
The pt was notified that a sample of ozempic is available for pickup.

## 2019-10-27 ENCOUNTER — Ambulatory Visit
Admission: RE | Admit: 2019-10-27 | Discharge: 2019-10-27 | Disposition: A | Discharge status: Home / Self Care | Payer: Medicare Other | Source: Ambulatory Visit | Attending: Nephrology | Admitting: Nephrology

## 2019-10-27 DIAGNOSIS — N281 Cyst of kidney, acquired: Secondary | ICD-10-CM | POA: Diagnosis not present

## 2019-10-27 DIAGNOSIS — N1832 Chronic kidney disease, stage 3b: Secondary | ICD-10-CM

## 2019-10-27 DIAGNOSIS — R809 Proteinuria, unspecified: Secondary | ICD-10-CM

## 2019-11-02 ENCOUNTER — Telehealth: Payer: Self-pay

## 2019-11-02 MED ORDER — PROPRANOLOL HCL ER 120 MG PO CP24: 120.0000 mg | ORAL_CAPSULE | Freq: Every day | ORAL | 0 refills | Status: DC

## 2019-11-02 NOTE — Telephone Encounter (Signed)
The pt was told that Dr. Baird Cancer said that it's ok for the refill on the propanolol because the pt said that what Dr. Merita Norton replaced it with is running her heart rate up.  The pt was told that Dr. Baird Cancer said to also contact Dr. Merita Norton.

## 2019-11-16 ENCOUNTER — Telehealth: Payer: Medicare Other

## 2019-11-16 ENCOUNTER — Telehealth: Payer: Self-pay

## 2019-11-16 NOTE — Telephone Encounter (Cosign Needed)
  Chronic Care Management   Outreach Note  11/16/2019 Name: Teresa Stanley MRN: [id number] DOB: December 13, 1945  Referred by: [name], MD Reason for referral : Chronic Care Management (CCM RN CM FU Call )   An unsuccessful telephone outreach was attempted today. The patient was referred to the case management team for assistance with care management and care coordination.   Follow Up Plan: A HIPAA compliant phone message was left for the patient providing contact information and requesting a return call.  Telephone follow up appointment with care management team member scheduled for: 12/14/19  Barb Merino, RN, BSN, CCM Care Management Coordinator [place] [facility]  Direct Phone: [phone]

## 2019-11-17 ENCOUNTER — Telehealth: Payer: Self-pay

## 2019-11-17 NOTE — Chronic Care Management (AMB) (Signed)
Chronic Care Management Pharmacy Assistant    Name: KARIS RILLING  MRN: [id number] DOB: Aug 13, 1945     Reason for Encounter: Medication Review - Patient Assistance Coordination.    PCP : [name], MD  Allergies:  No Known Allergies  Medications: Outpatient Encounter Medications as of 11/17/2019  Medication Sig  . amLODipine (NORVASC) 10 MG tablet TAKE 1 TABLET BY MOUTH EVERY DAY IN THE EVENING  . aspirin 81 MG tablet Take 81 mg by mouth daily.  [name] atorvastatin (LIPITOR) 20 MG tablet TAKE 1 TABLET BY ORAL ROUTE EVERY DAY  . Blood Glucose Monitoring Suppl (ONE TOUCH ULTRA 2) w/Device KIT Use as directed to check blood sugars 2 times per day dx:e11.22  . carboxymethylcellulose (REFRESH PLUS) 0.5 % SOLN Place 1 drop into both eyes daily.  . chlorthalidone (HYGROTON) 25 MG tablet Take 25 mg by mouth daily.  . cholecalciferol (VITAMIN D3) 25 MCG (1000 UNIT) tablet Take 1,000 Units by mouth daily.  . famotidine (PEPCID) 20 MG tablet Take 20 mg by mouth daily.  [name] gabapentin (NEURONTIN) 100 MG capsule Take 2 capsules po qhs  . glucose blood (ONE TOUCH ULTRA TEST) test strip Use as instructed to check blood sugars 2 times per day dx: e11.22  . hydrALAZINE (APRESOLINE) 25 MG tablet TAKE 1 TABLET BY MOUTH TWICE A DAY  . insulin degludec (TRESIBA FLEXTOUCH) 100 UNIT/ML SOPN FlexTouch Pen Inject 16 Units into the skin at bedtime.   . meclizine (ANTIVERT) 25 MG tablet TAKE 1 TABLET (25 MG TOTAL) BY MOUTH 3 (THREE) TIMES DAILY AS NEEDED FOR DIZZINESS.  . methimazole (TAPAZOLE) 5 MG tablet TAKE 1 TABLET BY MOUTH EVERY DAY  . omeprazole (PRILOSEC) 20 MG capsule TAKE ONE CAPSULE BY MOUTH BEFORE A MEAL (Patient not taking: Reported on 10/12/2019)  . OneTouch Delica Lancets 62M MISC Use as directed to check blood sugars 2 times per day dx: e11.22  . propranolol ER (INDERAL LA) 120 MG 24 hr capsule Take 1 capsule (120 mg total) by mouth daily.  . Semaglutide,0.25 or 0.5MG/DOS, (OZEMPIC, 0.25 OR  0.5 MG/DOSE,) 2 MG/1.5ML SOPN Inject 0.5 mg into the skin once a week. (Patient taking differently: Inject 0.5 mg into the skin once a week. Pt states she takes 0.25 on Wednesday and 0.5 on Sunday)  . triamcinolone cream (KENALOG) 0.1 % Apply 1 application topically 2 (two) times daily. Apply (Patient not taking: Reported on 08/29/2019)  . triamterene-hydrochlorothiazide (MAXZIDE-25) 37.5-25 MG tablet TAKE 1 TABLET BY MOUTH EVERY DAY IN THE MORNING (Patient not taking: Reported on 10/12/2019)   No facility-administered encounter medications on file as of 11/17/2019.    Current Diagnosis: Patient Active Problem List   Diagnosis Date Noted  . Class 3 severe obesity due to excess calories with serious comorbidity and body mass index (BMI) of 45.0 to 49.9 in adult ([place]) 05/28/2018  . Hypertensive nephropathy 08/11/2017  . Right knee DJD 11/26/2015  . S/P total knee arthroplasty 04/18/2013  . Unilateral primary osteoarthritis, right knee 04/04/2013    Class: Diagnosis of  . Candidal intertrigo 08/09/2012  . Hypertension 12/15/2011  . Diabetes mellitus with stage 3 chronic kidney disease ([place]) 12/15/2011     Follow-Up:  Patient Assistance Coordination - Reorder form filled out to [employer] for Ozempic 0.5 mg . Awaiting for provider signature to fax.  12/06/2019 - [employer] to check on status of Ozempic / Tresiba, per Mardene Celeste need refill request for [place].  Ozempic has been sent out.  Patient will need new application sent in before 01/2020 . Sent message to [name] today with teams.  Beverly Milch, CPP  Notified.  Judithann Sheen, [facility] Clinical Pharmacist Assistant [phone]

## 2019-11-21 ENCOUNTER — Other Ambulatory Visit: Payer: Self-pay | Admitting: Internal Medicine

## 2019-11-21 ENCOUNTER — Other Ambulatory Visit: Payer: Self-pay

## 2019-11-21 MED ORDER — ATORVASTATIN CALCIUM 20 MG PO TABS: ORAL_TABLET | ORAL | 2 refills | Status: DC

## 2019-11-24 ENCOUNTER — Other Ambulatory Visit: Payer: Self-pay | Admitting: Internal Medicine

## 2019-11-28 ENCOUNTER — Other Ambulatory Visit: Payer: Self-pay | Admitting: Internal Medicine

## 2019-12-01 ENCOUNTER — Other Ambulatory Visit: Payer: Self-pay | Admitting: Internal Medicine

## 2019-12-02 NOTE — Telephone Encounter (Signed)
Gabapentin refill

## 2019-12-06 DIAGNOSIS — R809 Proteinuria, unspecified: Secondary | ICD-10-CM | POA: Diagnosis not present

## 2019-12-06 DIAGNOSIS — Z7689 Persons encountering health services in other specified circumstances: Secondary | ICD-10-CM | POA: Diagnosis not present

## 2019-12-06 DIAGNOSIS — N1832 Chronic kidney disease, stage 3b: Secondary | ICD-10-CM | POA: Diagnosis not present

## 2019-12-06 DIAGNOSIS — I129 Hypertensive chronic kidney disease with stage 1 through stage 4 chronic kidney disease, or unspecified chronic kidney disease: Secondary | ICD-10-CM | POA: Diagnosis not present

## 2019-12-06 DIAGNOSIS — D631 Anemia in chronic kidney disease: Secondary | ICD-10-CM | POA: Diagnosis not present

## 2019-12-06 DIAGNOSIS — E875 Hyperkalemia: Secondary | ICD-10-CM | POA: Diagnosis not present

## 2019-12-13 ENCOUNTER — Telehealth: Payer: Self-pay

## 2019-12-13 NOTE — Telephone Encounter (Signed)
The pt was notified that Dr. Candiss Norse did blood work that is positive for syphilis and is the pt willing to go to the health dept for treatment.  The pt said yes.

## 2019-12-14 ENCOUNTER — Telehealth: Payer: Self-pay

## 2019-12-14 ENCOUNTER — Telehealth: Payer: Medicare Other

## 2019-12-14 NOTE — Telephone Encounter (Cosign Needed)
  Chronic Care Management   Outreach Note  12/14/2019 Name: Teresa Stanley MRN: [id number] DOB: 1945-03-16  Referred by: [name], MD Reason for referral : Chronic Care Management (CCM RNCM FU Call )   An unsuccessful telephone outreach was attempted today. The patient was referred to the case management team for assistance with care management and care coordination.   Follow Up Plan: Telephone follow up appointment with care management team member scheduled for: 01/19/20  Barb Merino, RN, BSN, CCM Care Management Coordinator [place] [facility]  Direct Phone: [phone]

## 2019-12-15 ENCOUNTER — Telehealth: Payer: Self-pay

## 2019-12-16 ENCOUNTER — Ambulatory Visit: Payer: Self-pay

## 2019-12-16 ENCOUNTER — Telehealth: Payer: Medicare Other

## 2019-12-16 ENCOUNTER — Other Ambulatory Visit: Payer: Self-pay

## 2019-12-16 DIAGNOSIS — N1832 Chronic kidney disease, stage 3b: Secondary | ICD-10-CM

## 2019-12-16 DIAGNOSIS — I129 Hypertensive chronic kidney disease with stage 1 through stage 4 chronic kidney disease, or unspecified chronic kidney disease: Secondary | ICD-10-CM

## 2019-12-16 DIAGNOSIS — Z794 Long term (current) use of insulin: Secondary | ICD-10-CM

## 2019-12-19 ENCOUNTER — Ambulatory Visit: Payer: Medicare Other | Admitting: Internal Medicine

## 2019-12-19 NOTE — Patient Instructions (Signed)
Visit Information  Goals Addressed      Patient Stated   .  "I would like to know more about my kidney disease" (pt-stated)   On track     [place] (see longtitudinal plan of care for additional care plan information)  Current Barriers:  [name] Knowledge Deficits related to disease process and Self Health management of CKD stage III . Chronic Disease Management support and education needs related to DM, CKD   Nurse Case Manager Clinical Goal(s):  . New 12/16/19 Over the next 90 days, patient will work with the CCM team and PCP to address needs related to disease education and support for improved self Health management of CKD . Over the next 90 days, patient will verbalize basic understanding of Chronic Kidney disease process and self health management plan as evidenced by patient will be able to maintain a GFR of 30-59 Goal Met   CCM RN CM Interventions:  12/16/19 call completed with patient  . Evaluation of current treatment plan related to CKD and patient's adherence to plan as established by provider. . Reinforced education to patient re: stages of CKD and patient's current GFR of 34; educated on signs/symptoms and ways to prevent further damage to kidneys . Reviewed and discussed patient's elevated potassium level; Re-educated on importance of eliminating [name] from her cooking, further educated patient on best salt substitutes to eat with kidney disease  . Determined patient is established with Nephrology who is also following her renal function closely . Confirmed patient received mailed printed educational materials related to 6 Ways to be Water Wise; Diabetes and Kidney Disease: What to Eat?; The Best Salt Substitutes for Kidney Patients, patient denies questions  . Discussed plans with patient for ongoing care management follow up and provided patient with direct contact information for care management team  Patient Self Care Activities:  . Self administers medications as  prescribed . Attends all scheduled provider appointments . Calls pharmacy for medication refills . Performs ADL's independently . Performs IADL's independently . Calls provider office for new concerns or questions  Please see past updates related to this goal by clicking on the "Past Updates" button in the selected goal      .  "I would like to lower my A1C" (pt-stated)   On track     [place] (see longtitudinal plan of care for additional care plan information)  Current Barriers:  [name] Knowledge Deficits related to disease process and Self Health management of type 2 DM   . Chronic Disease Management support and education needs related to DM, CKD stage 3   Nurse Case Manager Clinical Goal(s):  . New 12/16/19 Over the next 90 days, patient will work with CCM RN CM and PCP to address needs related to disease education and support to improve patient's ability to improve Self Health management of DM  . Over the next 90 days, patient will lower her A1C from 9.7, <7.0 Goal Not Met   CCM RN CM Interventions:  12/16/19 call completed with patient  . Evaluation of current treatment plan related to DM and patient's adherence to plan as established by provider. . Provided education to patient re: current A1c has decreased to 8.9 from 9.7; Positive reinforcement given to patient for maiking efforts to lower her A1c and get a better control on her diabetes; Re-educated on target A1c <7.0; Re-educated on potential complications for uncontrolled diabetes; Re-educated on best ways to manage DM and lower A1c; by adhering  to diabetic friendly diet, implementing exercise in daily routine, 150 minutes weekly; taking diabetic medications exactly as prescribed; Educated on 15'15 rule; Educated on daily glycemic goal FBS 80-130, <180 after meals; Determined patient provided PCP a weekly record of FBS with a few readings noted to be above the target range  . Reviewed medications with patient and discussed  indication, dosage and frequency of diabetic medications as prescribed; pt reports adherence and denies noted SE; Discussed and reviewed patient's understanding to increase her Tresiba to 16u at bedtime qd per Dr. Baird Cancer, Determined patient is taking Ozempic 0.25 mg on Wednesday and 0.5 mg on Sunday . Advised patient, providing education and rationale, to check cbg 1-2 times daily before meals and record, calling the CCM team and or PCP for findings outside established parameters . Discussed plans with patient for ongoing care management follow up and provided patient with direct contact information for care management team  Patient Self Care Activities:  . Self administers medications as prescribed . Attends all scheduled provider appointments . Calls pharmacy for medication refills . Performs ADL's independently . Performs IADL's independently . Calls provider office for new concerns or questions  Please see past updates related to this goal by clicking on the "Past Updates" button in the selected goal       .  "to get better blood pressure control" (pt-stated)   On track     [place] (see longitudinal plan of care for additional care plan information)  Current Barriers:  [name] Knowledge Deficits related to disease process and Self Health management of HTN . Chronic Disease Management support and education needs related to DM, CKD III, Hypertensive nephropathy  Nurse Case Manager Clinical Goal(s):  . New 12/16/19 Over the next 90 days, patient will work with the CCM team to address needs related to disease education and support to help improve Self Health management of HTN  CCM RN CM Interventions:  12/16/19 call completed with patient  . Inter-disciplinary care team collaboration (see longitudinal plan of care) . Evaluation of current treatment plan related to HTN and patient's adherence to plan as established by provider . Re-educated patient re: target BP <130/80; Educated on  ways to improve Self Health management of HTN by adhering to heart healthy, low Sodium diet, taking medications exactly as prescribed and implementing a routine exercise regimen, 150 minutes per week as tolerated . Reviewed medications with patient, educated on importance of adherence, patient's current regimen; o amLODipine (NORVASC) 10 MG tablet, TAKE 1 TABLET BY MOUTH EVERY DAY IN THE EVENING o propranolol ER (INDERAL LA) 120 MG 24 hr capsule, TAKE 1 CAPSULE BY MOUTH EVERY DAY o triamterene-hydrochlorothiazide (MAXZIDE-25) 37.5-25 MG tablet, TAKE 1 TABLET BY MOUTH EVERY DAY IN THE MORNING o hydrALAZINE (APRESOLINE) 25 MG tablet, One tab po twice daily . Determined patient continues to Self monitor her BP at home and is recording her Bp's, reports average BP has improved to 120's-140's over low 80's . Advised patient, providing education and rationale, to monitor blood pressure daily and record, calling the CCM team and PCP for findings outside established parameters . Discussed plans with patient for ongoing care management follow up and provided patient with direct contact information for care management team  Patient Self Care Activities:  . Self administers medications as prescribed . Attends all scheduled provider appointments . Calls pharmacy for medication refills . Performs ADL's independently . Performs IADL's independently . Calls provider office for new concerns or questions  Please see  past updates related to this goal by clicking on the "Past Updates" button in the selected goal        Patient verbalizes understanding of instructions provided today.   Telephone follow up appointment with care management team member scheduled for: 01/30/20  Barb Merino, RN, BSN, CCM Care Management Coordinator [place] [facility]  Direct Phone: [phone]

## 2019-12-19 NOTE — Chronic Care Management (AMB) (Signed)
Chronic Care Management   Follow Up Note   12/16/2019 Name: DONNITA FARINA MRN: [id number] DOB: 03-26-1945  Referred by: [name], MD Reason for referral : Chronic Care Management (Inbound Call from patient )   NEHEMIE CASSERLY is a 74 y.o. year old female who is a primary care patient of [name], MD. The CCM team was consulted for assistance with chronic disease management and care coordination needs.    Review of patient status, including review of consultants reports, relevant laboratory and other test results, and collaboration with appropriate care team members and the patient's provider was performed as part of comprehensive patient evaluation and provision of chronic care management services.    SDOH (Social Determinants of Health) assessments performed: Yes - no acute challenges noted  See Care Plan activities for detailed interventions related to [place])   Placed outbound CCM RNCM follow up call to patient for a care plan update.     Outpatient Encounter Medications as of 12/16/2019  Medication Sig  . amLODipine (NORVASC) 10 MG tablet TAKE 1 TABLET BY MOUTH EVERY DAY IN THE EVENING  . aspirin 81 MG tablet Take 81 mg by mouth daily.  [name] atorvastatin (LIPITOR) 20 MG tablet TAKE 1 TABLET BY MOUTH EVERY DAY  . Blood Glucose Monitoring Suppl (ONE TOUCH ULTRA 2) w/Device KIT Use as directed to check blood sugars 2 times per day dx:e11.22  . carboxymethylcellulose (REFRESH PLUS) 0.5 % SOLN Place 1 drop into both eyes daily.  . chlorthalidone (HYGROTON) 25 MG tablet Take 25 mg by mouth daily.  . cholecalciferol (VITAMIN D3) 25 MCG (1000 UNIT) tablet Take 1,000 Units by mouth daily.  . famotidine (PEPCID) 20 MG tablet Take 20 mg by mouth daily.  [name] gabapentin (NEURONTIN) 100 MG capsule TAKE 2 CAPSULES BY MOUTH AT BEDTIME  . hydrALAZINE (APRESOLINE) 25 MG tablet TAKE 1 TABLET BY MOUTH TWICE A DAY  . insulin degludec (TRESIBA FLEXTOUCH) 100 UNIT/ML SOPN FlexTouch Pen Inject 16  Units into the skin at bedtime.   . meclizine (ANTIVERT) 25 MG tablet TAKE 1 TABLET (25 MG TOTAL) BY MOUTH 3 (THREE) TIMES DAILY AS NEEDED FOR DIZZINESS.  . methimazole (TAPAZOLE) 5 MG tablet TAKE 1 TABLET BY MOUTH EVERY DAY  . omeprazole (PRILOSEC) 20 MG capsule TAKE ONE CAPSULE BY MOUTH BEFORE A MEAL (Patient not taking: Reported on 10/12/2019)  . OneTouch Delica Lancets 46N MISC Use as directed to check blood sugars 2 times per day dx: e11.22  . ONETOUCH ULTRA test strip USE AS INSTRUCTED TO CHECK BLOOD SUGARS 2 TIMES PER DAY DX: E11.22  . propranolol ER (INDERAL LA) 120 MG 24 hr capsule Take 1 capsule (120 mg total) by mouth daily.  . Semaglutide,0.25 or 0.5MG/DOS, (OZEMPIC, 0.25 OR 0.5 MG/DOSE,) 2 MG/1.5ML SOPN Inject 0.5 mg into the skin once a week. (Patient taking differently: Inject 0.5 mg into the skin once a week. Pt states she takes 0.25 on Wednesday and 0.5 on Sunday)  . triamcinolone cream (KENALOG) 0.1 % Apply 1 application topically 2 (two) times daily. Apply (Patient not taking: Reported on 08/29/2019)  . triamterene-hydrochlorothiazide (MAXZIDE-25) 37.5-25 MG tablet TAKE 1 TABLET BY MOUTH EVERY DAY IN THE MORNING (Patient not taking: Reported on 10/12/2019)   No facility-administered encounter medications on file as of 12/16/2019.     Objective:  Lab Results  Component Value Date   HGBA1C 8.9 (H) 06/16/2019   HGBA1C 9.7 (H) 03/17/2019   HGBA1C 8.6 (H) 12/14/2018   Lab Results  Component Value Date   MICROALBUR 80 06/16/2019   LDLCALC 80 03/17/2019   CREATININE 1.70 (H) 06/16/2019   BP Readings from Last 3 Encounters:  08/23/19 (!) 148/86  08/04/19 (!) 160/92  06/16/19 (!) 172/96    Goals Addressed      Patient Stated   .  "I would like to know more about my kidney disease" (pt-stated)   On track     [place] (see longtitudinal plan of care for additional care plan information)  Current Barriers:  [name] Knowledge Deficits related to disease process and Self  Health management of CKD stage III . Chronic Disease Management support and education needs related to DM, CKD   Nurse Case Manager Clinical Goal(s):  . New 12/16/19 Over the next 90 days, patient will work with the CCM team and PCP to address needs related to disease education and support for improved self Health management of CKD . Over the next 90 days, patient will verbalize basic understanding of Chronic Kidney disease process and self health management plan as evidenced by patient will be able to maintain a GFR of 30-59 Goal Met   CCM RN CM Interventions:  12/16/19 call completed with patient  . Evaluation of current treatment plan related to CKD and patient's adherence to plan as established by provider. . Reinforced education to patient re: stages of CKD and patient's current GFR of 34; educated on signs/symptoms and ways to prevent further damage to kidneys . Reviewed and discussed patient's elevated potassium level; Re-educated on importance of eliminating [name] from her cooking, further educated patient on best salt substitutes to eat with kidney disease  . Determined patient is established with Nephrology who is also following her renal function closely . Confirmed patient received mailed printed educational materials related to 6 Ways to be Water Wise; Diabetes and Kidney Disease: What to Eat?; The Best Salt Substitutes for Kidney Patients, patient denies questions  . Discussed plans with patient for ongoing care management follow up and provided patient with direct contact information for care management team  Patient Self Care Activities:  . Self administers medications as prescribed . Attends all scheduled provider appointments . Calls pharmacy for medication refills . Performs ADL's independently . Performs IADL's independently . Calls provider office for new concerns or questions  Please see past updates related to this goal by clicking on the "Past Updates" button in the  selected goal      .  "I would like to lower my A1C" (pt-stated)   On track     [place] (see longtitudinal plan of care for additional care plan information)  Current Barriers:  [name] Knowledge Deficits related to disease process and Self Health management of type 2 DM   . Chronic Disease Management support and education needs related to DM, CKD stage 3   Nurse Case Manager Clinical Goal(s):  . New 12/16/19 Over the next 90 days, patient will work with CCM RN CM and PCP to address needs related to disease education and support to improve patient's ability to improve Self Health management of DM  . Over the next 90 days, patient will lower her A1C from 9.7, <7.0 Goal Not Met   CCM RN CM Interventions:  12/16/19 call completed with patient  . Evaluation of current treatment plan related to DM and patient's adherence to plan as established by provider. . Provided education to patient re: current A1c has decreased to 8.9 from 9.7; Positive reinforcement given to  patient for maiking efforts to lower her A1c and get a better control on her diabetes; Re-educated on target A1c <7.0; Re-educated on potential complications for uncontrolled diabetes; Re-educated on best ways to manage DM and lower A1c; by adhering to diabetic friendly diet, implementing exercise in daily routine, 150 minutes weekly; taking diabetic medications exactly as prescribed; Educated on 15'15 rule; Educated on daily glycemic goal FBS 80-130, <180 after meals; Determined patient provided PCP a weekly record of FBS with a few readings noted to be above the target range  . Reviewed medications with patient and discussed indication, dosage and frequency of diabetic medications as prescribed; pt reports adherence and denies noted SE; Discussed and reviewed patient's understanding to increase her Tresiba to 16u at bedtime qd per Dr. Baird Cancer, Determined patient is taking Ozempic 0.25 mg on Wednesday and 0.5 mg on Sunday . Advised  patient, providing education and rationale, to check cbg 1-2 times daily before meals and record, calling the CCM team and or PCP for findings outside established parameters . Discussed plans with patient for ongoing care management follow up and provided patient with direct contact information for care management team  Patient Self Care Activities:  . Self administers medications as prescribed . Attends all scheduled provider appointments . Calls pharmacy for medication refills . Performs ADL's independently . Performs IADL's independently . Calls provider office for new concerns or questions  Please see past updates related to this goal by clicking on the "Past Updates" button in the selected goal       .  "to get better blood pressure control" (pt-stated)   On track     [place] (see longitudinal plan of care for additional care plan information)  Current Barriers:  [name] Knowledge Deficits related to disease process and Self Health management of HTN . Chronic Disease Management support and education needs related to DM, CKD III, Hypertensive nephropathy  Nurse Case Manager Clinical Goal(s):  . New 12/16/19 Over the next 90 days, patient will work with the CCM team to address needs related to disease education and support to help improve Self Health management of HTN  CCM RN CM Interventions:  12/16/19 call completed with patient  . Inter-disciplinary care team collaboration (see longitudinal plan of care) . Evaluation of current treatment plan related to HTN and patient's adherence to plan as established by provider . Re-educated patient re: target BP <130/80; Educated on ways to improve Self Health management of HTN by adhering to heart healthy, low Sodium diet, taking medications exactly as prescribed and implementing a routine exercise regimen, 150 minutes per week as tolerated . Reviewed medications with patient, educated on importance of adherence, patient's current  regimen; o amLODipine (NORVASC) 10 MG tablet, TAKE 1 TABLET BY MOUTH EVERY DAY IN THE EVENING o propranolol ER (INDERAL LA) 120 MG 24 hr capsule, TAKE 1 CAPSULE BY MOUTH EVERY DAY o triamterene-hydrochlorothiazide (MAXZIDE-25) 37.5-25 MG tablet, TAKE 1 TABLET BY MOUTH EVERY DAY IN THE MORNING o hydrALAZINE (APRESOLINE) 25 MG tablet, One tab po twice daily . Determined patient continues to Self monitor her BP at home and is recording her Bp's, reports average BP has improved to 120's-140's over low 80's . Advised patient, providing education and rationale, to monitor blood pressure daily and record, calling the CCM team and PCP for findings outside established parameters . Discussed plans with patient for ongoing care management follow up and provided patient with direct contact information for care management team  Patient Self Care  Activities:  . Self administers medications as prescribed . Attends all scheduled provider appointments . Calls pharmacy for medication refills . Performs ADL's independently . Performs IADL's independently . Calls provider office for new concerns or questions  Please see past updates related to this goal by clicking on the "Past Updates" button in the selected goal        Plan:   Telephone follow up appointment with care management team member scheduled for: 01/30/20  Barb Merino, RN, BSN, CCM Care Management Coordinator [place] [facility]  Direct Phone: [phone]

## 2019-12-23 ENCOUNTER — Other Ambulatory Visit: Payer: Self-pay | Admitting: Internal Medicine

## 2020-01-16 ENCOUNTER — Other Ambulatory Visit: Payer: Self-pay | Admitting: Internal Medicine

## 2020-01-19 ENCOUNTER — Telehealth: Payer: Medicare Other

## 2020-01-23 ENCOUNTER — Telehealth: Payer: Self-pay

## 2020-01-23 NOTE — Chronic Care Management (AMB) (Signed)
Chronic Care Management Pharmacy Assistant   Name: Teresa Stanley  MRN: [id number] DOB: 1945-03-20  Reason for Encounter: Medication Review    PCP : [name], MD  Allergies:  No Known Allergies  Medications: Outpatient Encounter Medications as of 01/23/2020  Medication Sig   amLODipine (NORVASC) 10 MG tablet TAKE 1 TABLET BY MOUTH EVERY DAY IN THE EVENING   aspirin 81 MG tablet Take 81 mg by mouth daily.   atorvastatin (LIPITOR) 20 MG tablet TAKE 1 TABLET BY MOUTH EVERY DAY   Blood Glucose Monitoring Suppl (ONE TOUCH ULTRA 2) w/Device KIT Use as directed to check blood sugars 2 times per day dx:e11.22   carboxymethylcellulose (REFRESH PLUS) 0.5 % SOLN Place 1 drop into both eyes daily.   chlorthalidone (HYGROTON) 25 MG tablet Take 25 mg by mouth daily.   cholecalciferol (VITAMIN D3) 25 MCG (1000 UNIT) tablet Take 1,000 Units by mouth daily.   famotidine (PEPCID) 20 MG tablet Take 20 mg by mouth daily.   gabapentin (NEURONTIN) 100 MG capsule TAKE 2 CAPSULES BY MOUTH AT BEDTIME   hydrALAZINE (APRESOLINE) 25 MG tablet TAKE 1 TABLET BY MOUTH TWICE A DAY   insulin degludec (TRESIBA FLEXTOUCH) 100 UNIT/ML SOPN FlexTouch Pen Inject 16 Units into the skin at bedtime.    meclizine (ANTIVERT) 25 MG tablet TAKE 1 TABLET (25 MG TOTAL) BY MOUTH 3 (THREE) TIMES DAILY AS NEEDED FOR DIZZINESS.   methimazole (TAPAZOLE) 5 MG tablet TAKE 1 TABLET BY MOUTH EVERY DAY   omeprazole (PRILOSEC) 20 MG capsule TAKE ONE CAPSULE BY MOUTH BEFORE A MEAL (Patient not taking: Reported on 08/12/2200)   OneTouch Delica Lancets 54Y MISC Use as directed to check blood sugars 2 times per day dx: e11.22   ONETOUCH ULTRA test strip USE AS INSTRUCTED TO CHECK BLOOD SUGARS 2 TIMES PER DAY DX: E11.22   propranolol ER (INDERAL LA) 120 MG 24 hr capsule Take 1 capsule (120 mg total) by mouth daily.   Semaglutide,0.25 or 0.5MG/DOS, (OZEMPIC, 0.25 OR 0.5 MG/DOSE,) 2 MG/1.5ML SOPN Inject 0.5 mg into the  skin once a week. (Patient taking differently: Inject 0.5 mg into the skin once a week. Pt states she takes 0.25 on Wednesday and 0.5 on Sunday)   triamcinolone cream (KENALOG) 0.1 % Apply 1 application topically 2 (two) times daily. Apply (Patient not taking: Reported on 08/29/2019)   triamterene-hydrochlorothiazide (MAXZIDE-25) 37.5-25 MG tablet TAKE 1 TABLET BY MOUTH EVERY DAY IN THE MORNING (Patient not taking: Reported on 10/12/2019)   No facility-administered encounter medications on file as of 01/23/2020.    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  Waiting for provider and patient's signature and proof of income.  Called patient to have her come to PCP office to sign application and to bring in proof of income. Per patient will be in the week of 01/30/2020.   Teresa Stanley,CPP  Notified  Teresa Stanley, [place] Pharmacist Assistant [phone]

## 2020-01-25 ENCOUNTER — Other Ambulatory Visit: Payer: Self-pay | Admitting: Internal Medicine

## 2020-01-30 ENCOUNTER — Telehealth: Payer: Medicare Other

## 2020-02-08 ENCOUNTER — Other Ambulatory Visit: Payer: Self-pay | Admitting: Internal Medicine

## 2020-02-13 ENCOUNTER — Telehealth: Payer: Self-pay

## 2020-02-13 NOTE — Chronic Care Management (AMB) (Signed)
° ° °  Chronic Care Management Pharmacy Assistant   Name: Teresa Stanley  MRN: [id number] DOB: Apr 19, 1945  Reason for Encounter: Medication Review     PCP : [name], MD  Allergies:  No Known Allergies  Medications: Outpatient Encounter Medications as of 02/13/2020  Medication Sig   amLODipine (NORVASC) 10 MG tablet TAKE 1 TABLET BY MOUTH EVERY DAY IN THE EVENING   aspirin 81 MG tablet Take 81 mg by mouth daily.   atorvastatin (LIPITOR) 20 MG tablet TAKE 1 TABLET BY MOUTH EVERY DAY   Blood Glucose Monitoring Suppl (ONE TOUCH ULTRA 2) w/Device KIT Use as directed to check blood sugars 2 times per day dx:e11.22   carboxymethylcellulose (REFRESH PLUS) 0.5 % SOLN Place 1 drop into both eyes daily.   chlorthalidone (HYGROTON) 25 MG tablet Take 25 mg by mouth daily.   cholecalciferol (VITAMIN D3) 25 MCG (1000 UNIT) tablet Take 1,000 Units by mouth daily.   famotidine (PEPCID) 20 MG tablet Take 20 mg by mouth daily.   gabapentin (NEURONTIN) 100 MG capsule TAKE 2 CAPSULES BY MOUTH AT BEDTIME   hydrALAZINE (APRESOLINE) 25 MG tablet TAKE 1 TABLET BY MOUTH TWICE A DAY   insulin degludec (TRESIBA FLEXTOUCH) 100 UNIT/ML SOPN FlexTouch Pen Inject 16 Units into the skin at bedtime.    meclizine (ANTIVERT) 25 MG tablet TAKE 1 TABLET (25 MG TOTAL) BY MOUTH 3 (THREE) TIMES DAILY AS NEEDED FOR DIZZINESS.   methimazole (TAPAZOLE) 5 MG tablet TAKE 1 TABLET BY MOUTH EVERY DAY   omeprazole (PRILOSEC) 20 MG capsule TAKE ONE CAPSULE BY MOUTH BEFORE A MEAL (Patient not taking: Reported on 02/16/9676)   OneTouch Delica Lancets 93Y MISC Use as directed to check blood sugars 2 times per day dx: e11.22   ONETOUCH ULTRA test strip USE AS INSTRUCTED TO CHECK BLOOD SUGARS 2 TIMES PER DAY DX: E11.22   propranolol ER (INDERAL LA) 120 MG 24 hr capsule TAKE 1 CAPSULE BY MOUTH EVERY DAY   Semaglutide,0.25 or 0.5MG/DOS, (OZEMPIC, 0.25 OR 0.5 MG/DOSE,) 2 MG/1.5ML SOPN Inject 0.5 mg into the skin once a  week. (Patient taking differently: Inject 0.5 mg into the skin once a week. Pt states she takes 0.25 on Wednesday and 0.5 on Sunday)   triamcinolone cream (KENALOG) 0.1 % Apply 1 application topically 2 (two) times daily. Apply (Patient not taking: Reported on 08/29/2019)   triamterene-hydrochlorothiazide (MAXZIDE-25) 37.5-25 MG tablet TAKE 1 TABLET BY MOUTH EVERY DAY IN THE MORNING (Patient not taking: Reported on 10/12/2019)   No facility-administered encounter medications on file as of 02/13/2020.    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 Per Patient she took her proof of income last week to office , will be in on 02/16/2020.  [name], CPP. Notified  Teresa Stanley, [place] Pharmacist Assistant [phone]

## 2020-02-22 ENCOUNTER — Other Ambulatory Visit: Payer: Self-pay | Admitting: Internal Medicine

## 2020-02-24 ENCOUNTER — Other Ambulatory Visit: Payer: Self-pay | Admitting: Internal Medicine

## 2020-03-02 ENCOUNTER — Other Ambulatory Visit: Payer: Self-pay | Admitting: Internal Medicine

## 2020-03-05 ENCOUNTER — Telehealth: Payer: Self-pay

## 2020-03-05 ENCOUNTER — Telehealth: Payer: Medicare Other

## 2020-03-05 NOTE — Progress Notes (Signed)
  Chronic Care Management Pharmacy Assistant   Name: Teresa Stanley  MRN: [id number] DOB: 02/22/1945  Reason for Encounter: Medication Review    PCP : Sanders, Robyn, MD  Allergies:  No Known Allergies  Medications: Outpatient Encounter Medications as of 03/05/2020  Medication Sig  . amLODipine (NORVASC) 10 MG tablet TAKE 1 TABLET BY MOUTH EVERY DAY IN THE EVENING  . aspirin 81 MG tablet Take 81 mg by mouth daily.  . atorvastatin (LIPITOR) 20 MG tablet TAKE 1 TABLET BY MOUTH EVERY DAY  . Blood Glucose Monitoring Suppl (ONE TOUCH ULTRA 2) w/Device KIT Use as directed to check blood sugars 2 times per day dx:e11.22  . carboxymethylcellulose (REFRESH PLUS) 0.5 % SOLN Place 1 drop into both eyes daily.  . chlorthalidone (HYGROTON) 25 MG tablet Take 25 mg by mouth daily.  . cholecalciferol (VITAMIN D3) 25 MCG (1000 UNIT) tablet Take 1,000 Units by mouth daily.  . famotidine (PEPCID) 20 MG tablet Take 20 mg by mouth daily.  . gabapentin (NEURONTIN) 100 MG capsule TAKE 2 CAPSULES BY MOUTH AT BEDTIME  . hydrALAZINE (APRESOLINE) 25 MG tablet TAKE 1 TABLET BY MOUTH TWICE A DAY  . insulin degludec (TRESIBA FLEXTOUCH) 100 UNIT/ML SOPN FlexTouch Pen Inject 16 Units into the skin at bedtime.   . meclizine (ANTIVERT) 25 MG tablet TAKE 1 TABLET (25 MG TOTAL) BY MOUTH 3 (THREE) TIMES DAILY AS NEEDED FOR DIZZINESS.  . methimazole (TAPAZOLE) 5 MG tablet TAKE 1 TABLET BY MOUTH EVERY DAY  . omeprazole (PRILOSEC) 20 MG capsule TAKE ONE CAPSULE BY MOUTH BEFORE A MEAL (Patient not taking: Reported on 10/12/2019)  . OneTouch Delica Lancets 33G MISC Use as directed to check blood sugars 2 times per day dx: e11.22  . ONETOUCH ULTRA test strip USE AS INSTRUCTED TO CHECK BLOOD SUGARS 2 TIMES PER DAY DX: E11.22  . propranolol ER (INDERAL LA) 120 MG 24 hr capsule TAKE 1 CAPSULE BY MOUTH EVERY DAY  . Semaglutide,0.25 or 0.5MG/DOS, (OZEMPIC, 0.25 OR 0.5 MG/DOSE,) 2 MG/1.5ML SOPN Inject 0.5 mg into the skin once a  week. (Patient taking differently: Inject 0.5 mg into the skin once a week. Pt states she takes 0.25 on Wednesday and 0.5 on Sunday)  . triamcinolone cream (KENALOG) 0.1 % Apply 1 application topically 2 (two) times daily. Apply (Patient not taking: Reported on 08/29/2019)  . triamterene-hydrochlorothiazide (MAXZIDE-25) 37.5-25 MG tablet TAKE 1 TABLET BY MOUTH EVERY DAY IN THE MORNING (Patient not taking: Reported on 10/12/2019)   No facility-administered encounter medications on file as of 03/05/2020.    Current Diagnosis: Patient Active Problem List   Diagnosis Date Noted  . Class 3 severe obesity due to excess calories with serious comorbidity and body mass index (BMI) of 45.0 to 49.9 in adult ([facility]) 05/28/2018  . Hypertensive nephropathy 08/11/2017  . Right knee DJD 11/26/2015  . S/P total knee arthroplasty 04/18/2013  . Unilateral primary osteoarthritis, right knee 04/04/2013    Class: Diagnosis of  . Candidal intertrigo 08/09/2012  . Hypertension 12/15/2011  . Diabetes mellitus with stage 3 chronic kidney disease ([facility]) 12/15/2011     Follow-Up:  Patient Assistance Coordination -  Staff message to return patent's call about her patient assistance form for Ozempic.  Called patient she said that she just received an application for from [employer] to filled out for he Ozempic. States she has already filled these forms out and sign at PCP. I told her that I would check on this and call   her back, Patient voiced understanding.  03/08/2020 Called patient per Tamala Melvin, to let her know that her application for Ozempic has been faxed. Patient can go by Dr. Sanders office to pick up some samples for her Ozempic. Per patient she will go today to pick up at PCP office. I told patient to just keep the application that was mailed to her in a safe place just in case we needed later. Patient voiced understanding.   Teresa Stanley, CPP Notified  Veronica Foster, CCMA Clinical Pharmacist  Assistant [phone]   

## 2020-03-05 NOTE — Telephone Encounter (Cosign Needed)
   03/05/2020  Teresa Stanley 12-08-1945 [id number]    Received voice message from patient, stating she received a letter requesting further documentation for assistance with drug cost for Ozempic. Noted patient is established with embedded Pharm D [name] who is assisting patient with this issue. Sent in basket message to [facility], requesting she f/u with Ms. Lingafelter to further assist with the requested documents needed for Ozempic PAP. The patient was referred to the case management team for assistance with care management and care coordination.   Follow Up Plan: Telephone follow up appointment with care management team member scheduled for: 03/23/20  Barb Merino, RN, BSN, CCM Care Management Coordinator [place] [facility]  Direct Phone: [phone]

## 2020-03-07 ENCOUNTER — Telehealth: Payer: Self-pay

## 2020-03-07 NOTE — Chronic Care Management (AMB) (Signed)
03/07/20-Received a staff message to call and update the patient that her [place] patient assistance application for Ozempic has been re-faxed. Called and spoke with the patient to let her know patient assistance application for Ozempic was re-faxed to [employer]. Patient verbalized understanding.  Patient reported she is completley out of her Ozempic medication and inquiring to know if Dr.Sanders has any samples. Advised the patient I will need to look into this and call her back. Patient voiced understanding.  [name], CPP notified.  Raynelle Highland, [place] Pharmacist Assistant [phone]

## 2020-03-15 ENCOUNTER — Other Ambulatory Visit: Payer: Self-pay | Admitting: Internal Medicine

## 2020-03-15 DIAGNOSIS — Z1231 Encounter for screening mammogram for malignant neoplasm of breast: Secondary | ICD-10-CM

## 2020-03-21 ENCOUNTER — Telehealth: Payer: Self-pay

## 2020-03-21 NOTE — Telephone Encounter (Signed)
Pt notified about medication ready for pick up [place]

## 2020-03-22 ENCOUNTER — Encounter: Payer: Self-pay | Admitting: Internal Medicine

## 2020-03-22 ENCOUNTER — Ambulatory Visit (INDEPENDENT_AMBULATORY_CARE_PROVIDER_SITE_OTHER): Payer: Medicare Other

## 2020-03-22 ENCOUNTER — Other Ambulatory Visit: Payer: Self-pay

## 2020-03-22 ENCOUNTER — Ambulatory Visit (INDEPENDENT_AMBULATORY_CARE_PROVIDER_SITE_OTHER): Payer: Medicare Other | Admitting: Internal Medicine

## 2020-03-22 VITALS — BP 160/110 | HR 92 | Temp 97.8°F | Ht 62.0 in | Wt 261.8 lb

## 2020-03-22 DIAGNOSIS — Z0000 Encounter for general adult medical examination without abnormal findings: Secondary | ICD-10-CM

## 2020-03-22 DIAGNOSIS — Z23 Encounter for immunization: Secondary | ICD-10-CM

## 2020-03-22 DIAGNOSIS — E1122 Type 2 diabetes mellitus with diabetic chronic kidney disease: Secondary | ICD-10-CM | POA: Diagnosis not present

## 2020-03-22 DIAGNOSIS — N1832 Chronic kidney disease, stage 3b: Secondary | ICD-10-CM

## 2020-03-22 DIAGNOSIS — E0590 Thyrotoxicosis, unspecified without thyrotoxic crisis or storm: Secondary | ICD-10-CM | POA: Diagnosis not present

## 2020-03-22 DIAGNOSIS — I129 Hypertensive chronic kidney disease with stage 1 through stage 4 chronic kidney disease, or unspecified chronic kidney disease: Secondary | ICD-10-CM

## 2020-03-22 DIAGNOSIS — Z794 Long term (current) use of insulin: Secondary | ICD-10-CM | POA: Diagnosis not present

## 2020-03-22 DIAGNOSIS — Z6841 Body Mass Index (BMI) 40.0 and over, adult: Secondary | ICD-10-CM

## 2020-03-22 MED ORDER — PREVNAR 13 IM SUSP: 0.5000 mL | INTRAMUSCULAR | 0 refills | Status: AC

## 2020-03-22 MED ORDER — SHINGRIX 50 MCG/0.5ML IM SUSR: 0.5000 mL | Freq: Once | INTRAMUSCULAR | 0 refills | Status: AC

## 2020-03-22 MED ORDER — SEMAGLUTIDE (1 MG/DOSE) 4 MG/3ML ~~LOC~~ SOPN: 1.0000 mg | PEN_INJECTOR | SUBCUTANEOUS | 1 refills | Status: DC

## 2020-03-22 NOTE — Progress Notes (Signed)
This visit occurred during the SARS-CoV-2 public health emergency.  Safety protocols were in place, including screening questions prior to the visit, additional usage of staff PPE, and extensive cleaning of exam room while observing appropriate contact time as indicated for disinfecting solutions.  Subjective:   ABRIE EGLOFF is a 75 y.o. female who presents for Medicare Annual (Subsequent) preventive examination.  Review of Systems     Cardiac Risk Factors include: advanced age (>41mn, >>63women);diabetes mellitus;hypertension;obesity (BMI >30kg/m2)     Objective:    Today's Vitals   03/22/20 0938  BP: (!) 160/110  Pulse: 92  Temp: 97.8 F (36.6 C)  TempSrc: Oral  SpO2: 92%  Weight: 261 lb 12.8 oz (118.8 kg)  Height: '5\' 2"'  (1.575 m)   Body mass index is 47.88 kg/m.  Advanced Directives 03/22/2020 03/17/2019 05/27/2018 01/16/2016 11/27/2015 11/16/2015 04/04/2013  Does Patient Have a Medical Advance Directive? Yes Yes Yes Yes No No Patient has advance directive, copy not in chart  Type of Advance Directive [place] will [place] will [place] will - - - HPress photographer Does patient want to make changes to medical advance directive? - - No - Patient declined - - - No change requested  Copy of [place] Chart? No - copy requested No - copy requested No - copy requested - - - -  Would patient like information on creating a medical advance directive? - - - - No - patient declined information - -    Current Medications (verified) Outpatient Encounter Medications as of 03/22/2020  Medication Sig  . amLODipine (NORVASC) 10 MG tablet TAKE 1 TABLET BY MOUTH EVERY DAY IN THE EVENING  . aspirin 81 MG tablet Take 81 mg by mouth daily.  .Marland Kitchenatorvastatin (LIPITOR) 20 MG tablet TAKE 1 TABLET BY MOUTH EVERY DAY  . Blood Glucose Monitoring Suppl (ONE TOUCH ULTRA 2) w/Device KIT Use as  directed to check blood sugars 2 times per day dx:e11.22  . carboxymethylcellulose (REFRESH PLUS) 0.5 % SOLN Place 1 drop into both eyes daily.  . chlorthalidone (HYGROTON) 25 MG tablet Take 25 mg by mouth daily.  . cholecalciferol (VITAMIN D3) 25 MCG (1000 UNIT) tablet Take 1,000 Units by mouth daily.  . famotidine (PEPCID) 20 MG tablet Take 20 mg by mouth daily.  .Marland Kitchengabapentin (NEURONTIN) 100 MG capsule TAKE 2 CAPSULES BY MOUTH AT BEDTIME  . hydrALAZINE (APRESOLINE) 25 MG tablet TAKE 1 TABLET BY MOUTH TWICE A DAY  . insulin degludec (TRESIBA FLEXTOUCH) 100 UNIT/ML SOPN FlexTouch Pen Inject 16 Units into the skin at bedtime.   . meclizine (ANTIVERT) 25 MG tablet TAKE 1 TABLET (25 MG TOTAL) BY MOUTH 3 (THREE) TIMES DAILY AS NEEDED FOR DIZZINESS.  . methimazole (TAPAZOLE) 5 MG tablet TAKE 1 TABLET BY MOUTH EVERY DAY  . OneTouch Delica Lancets 332RMISC Use as directed to check blood sugars 2 times per day dx: e11.22  . ONETOUCH ULTRA test strip USE AS INSTRUCTED TO CHECK BLOOD SUGARS 2 TIMES PER DAY DX: E11.22  . pneumococcal 13-valent conjugate vaccine (PREVNAR 13) SUSP injection Inject 0.5 mLs into the muscle tomorrow at 10 am for 1 dose.  . propranolol ER (INDERAL LA) 120 MG 24 hr capsule TAKE 1 CAPSULE BY MOUTH EVERY DAY  . Semaglutide,0.25 or 0.5MG/DOS, (OZEMPIC, 0.25 OR 0.5 MG/DOSE,) 2 MG/1.5ML SOPN Inject 0.5 mg into the skin once a week. (Patient taking differently: Inject 0.5  mg into the skin once a week. Pt states she takes 0.25 on Wednesday and 0.5 on Sunday)  . triamcinolone cream (KENALOG) 0.1 % Apply 1 application topically 2 (two) times daily. Apply  . Zoster Vaccine Adjuvanted [facility]) injection Inject 0.5 mLs into the muscle once for 1 dose.  [name] omeprazole (PRILOSEC) 20 MG capsule TAKE ONE CAPSULE BY MOUTH BEFORE A MEAL (Patient not taking: Reported on 03/22/2020)  . triamterene-hydrochlorothiazide (MAXZIDE-25) 37.5-25 MG tablet TAKE 1 TABLET BY MOUTH EVERY DAY IN THE MORNING (Patient  not taking: No sig reported)   No facility-administered encounter medications on file as of 03/22/2020.    Allergies (verified) Patient has no known allergies.   History: Past Medical History:  Diagnosis Date  . Arthritis   . Diabetes mellitus    takes Actos and Metformin daily and Victoza  . GERD (gastroesophageal reflux disease)    takes Omeprazole daily  . Headache(784.0)    occasionally  . Hyperlipidemia    takes Atorvastatin daily  . Hypertension    takes Lisinopril,Amlodipine,and Metoprolol daily  . Hypothyroidism   . Joint pain   . Thyroid disease    ?   Past Surgical History:  Procedure Laterality Date  . ABDOMINAL HYSTERECTOMY    . BREAST BIOPSY Left   . BREAST EXCISIONAL BIOPSY Left   . CARDIAC CATHETERIZATION  2009  . CATARACT EXTRACTION Right   . EYE SURGERY    . JOINT REPLACEMENT    . KNEE ARTHROPLASTY Left 04/04/2013   Procedure: COMPUTER ASSISTED TOTAL KNEE ARTHROPLASTY;  Surgeon: Marybelle Killings, MD;  Location: [facility];  Service: Orthopedics;  Laterality: Left;  Left Total Knee Arthroplasty, Cemented  . TOTAL KNEE ARTHROPLASTY Right 11/26/2015   Procedure: TOTAL KNEE ARTHROPLASTY;  Surgeon: Marybelle Killings, MD;  Location: [facility];  Service: Orthopedics;  Laterality: Right;   Family History  Problem Relation Age of Onset  . Diabetes Mother   . Hypertension Mother   . Diabetes Father   . Asthma Father   . Hypertension Father   . Hypertension Daughter   . Hypertension Sister        2  . Diabetes Sister   . Hypertension Brother        5  . Kidney disease Brother   . Colon cancer Neg Hx   . Esophageal cancer Neg Hx   . Rectal cancer Neg Hx   . Stomach cancer Neg Hx    Social History   Socioeconomic History  . Marital status: Widowed    Spouse name: Not on file  . Number of children: 4  . Years of education: Not on file  . Highest education level: Not on file  Occupational History  . Occupation: retired  Tobacco Use  . Smoking status: Never Smoker   . Smokeless tobacco: Never Used  Vaping Use  . Vaping Use: Never used  Substance and Sexual Activity  . Alcohol use: No  . Drug use: No  . Sexual activity: Not Currently    Birth control/protection: Surgical  Other Topics Concern  . Not on file  Social History Narrative  . Not on file   Social Determinants of Health   Financial Resource Strain: Low Risk   . Difficulty of Paying Living Expenses: Not hard at all  Food Insecurity: No Food Insecurity  . Worried About Charity fundraiser in the Last Year: Never true  . Ran Out of Food in the Last Year: Never true  Transportation Needs: No  Transportation Needs  . Lack of Transportation (Medical): No  . Lack of Transportation (Non-Medical): No  Physical Activity: Sufficiently Active  . Days of Exercise per Week: 5 days  . Minutes of Exercise per Session: 30 min  Stress: No Stress Concern Present  . Feeling of Stress : Not at all  Social Connections: Not on file    Tobacco Counseling Counseling given: Not Answered   Clinical Intake:  Pre-visit preparation completed: Yes  Pain : No/denies pain     Nutritional Status: BMI > 30  Obese Nutritional Risks: None Diabetes: Yes  How often do you need to have someone help you when you read instructions, pamphlets, or other written materials from your doctor or pharmacy?: 1 - Never What is the last grade level you completed in school?: 12th grade  Diabetic? Yes Nutrition Risk Assessment:  Has the patient had any N/V/D within the last 2 months?  No  Does the patient have any non-healing wounds?  No  Has the patient had any unintentional weight loss or weight gain?  No   Diabetes:  Is the patient diabetic?  Yes  If diabetic, was a CBG obtained today?  No  Did the patient bring in their glucometer from home?  No  How often do you monitor your CBG's? daily.   Financial Strains and Diabetes Management:  Are you having any financial strains with the device, your supplies or  your medication? No .  Does the patient want to be seen by Chronic Care Management for management of their diabetes?  No  Would the patient like to be referred to a Nutritionist or for Diabetic Management?  No   Diabetic Exams:  Diabetic Eye Exam: Overdue for diabetic eye exam. Pt has been advised about the importance in completing this exam. Patient advised to call and schedule an eye exam. Diabetic Foot Exam: Overdue, Pt has been advised about the importance in completing this exam. Pt is scheduled for diabetic foot exam on next appointment.   Interpreter Needed?: No  Information entered by :: NAllen LPN   Activities of Daily Living In your present state of health, do you have any difficulty performing the following activities: 03/22/2020  Hearing? N  Vision? N  Difficulty concentrating or making decisions? N  Walking or climbing stairs? Y  Dressing or bathing? N  Doing errands, shopping? N  Preparing Food and eating ? N  Using the Toilet? N  In the past six months, have you accidently leaked urine? N  Do you have problems with loss of bowel control? N  Managing your Medications? N  Managing your Finances? N  Housekeeping or managing your Housekeeping? N  Some recent data might be hidden    Patient Care Team: [name], MD as PCP - General (Internal Medicine) Rex Kras, Claudette Stapler, RN as Case Manager [place], Kennieth Francois, [facility] (Inactive) (Pharmacist)  Indicate any recent Medical Services you may have received from other than [facility] providers in the past year (date may be approximate).     Assessment:   This is a routine wellness examination for Nikkita.  Hearing/Vision screen  Hearing Screening   '125Hz'  '250Hz'  '500Hz'  '1000Hz'  '2000Hz'  '3000Hz'  '4000Hz'  '6000Hz'  '8000Hz'   Right ear:           Left ear:           Vision Screening Comments: Regular eye exams, Dr Harrison Mons  Dietary issues and exercise activities discussed: Current Exercise Habits: Home exercise routine, Type of exercise:  stretching;walking, Time (  Minutes): 30, Frequency (Times/Week): 5, Weekly Exercise (Minutes/Week): 150  Goals    .  "I would like to know more about my kidney disease" (pt-stated)      CARE PLAN ENTRY (see longtitudinal plan of care for additional care plan information)  Current Barriers:  [name] Knowledge Deficits related to disease process and Self Health management of CKD stage III . Chronic Disease Management support and education needs related to DM, CKD   Nurse Case Manager Clinical Goal(s):  . New 12/16/19 Over the next 90 days, patient will work with the CCM team and PCP to address needs related to disease education and support for improved self Health management of CKD . Over the next 90 days, patient will verbalize basic understanding of Chronic Kidney disease process and self health management plan as evidenced by patient will be able to maintain a GFR of 30-59 Goal Met   CCM RN CM Interventions:  12/16/19 call completed with patient  . Evaluation of current treatment plan related to CKD and patient's adherence to plan as established by provider. . Reinforced education to patient re: stages of CKD and patient's current GFR of 34; educated on signs/symptoms and ways to prevent further damage to kidneys . Reviewed and discussed patient's elevated potassium level; Re-educated on importance of eliminating [name] from her cooking, further educated patient on best salt substitutes to eat with kidney disease  . Determined patient is established with Nephrology who is also following her renal function closely . Confirmed patient received mailed printed educational materials related to 6 Ways to be Water Wise; Diabetes and Kidney Disease: What to Eat?; The Best Salt Substitutes for Kidney Patients, patient denies questions  . Discussed plans with patient for ongoing care management follow up and provided patient with direct contact information for care management team  Patient Self Care  Activities:  . Self administers medications as prescribed . Attends all scheduled provider appointments . Calls pharmacy for medication refills . Performs ADL's independently . Performs IADL's independently . Calls provider office for new concerns or questions  Please see past updates related to this goal by clicking on the "Past Updates" button in the selected goal      .  "I would like to lower my A1C" (pt-stated)      CARE PLAN ENTRY (see longtitudinal plan of care for additional care plan information)  Current Barriers:  [name] Knowledge Deficits related to disease process and Self Health management of type 2 DM   . Chronic Disease Management support and education needs related to DM, CKD stage 3   Nurse Case Manager Clinical Goal(s):  . New 12/16/19 Over the next 90 days, patient will work with CCM RN CM and PCP to address needs related to disease education and support to improve patient's ability to improve Self Health management of DM  . Over the next 90 days, patient will lower her A1C from 9.7, <7.0 Goal Not Met   CCM RN CM Interventions:  12/16/19 call completed with patient  . Evaluation of current treatment plan related to DM and patient's adherence to plan as established by provider. . Provided education to patient re: current A1c has decreased to 8.9 from 9.7; Positive reinforcement given to patient for maiking efforts to lower her A1c and get a better control on her diabetes; Re-educated on target A1c <7.0; Re-educated on potential complications for uncontrolled diabetes; Re-educated on best ways to manage DM and lower A1c; by adhering to diabetic friendly diet, implementing exercise  in daily routine, 150 minutes weekly; taking diabetic medications exactly as prescribed; Educated on 15'15 rule; Educated on daily glycemic goal FBS 80-130, <180 after meals; Determined patient provided PCP a weekly record of FBS with a few readings noted to be above the target range  . Reviewed  medications with patient and discussed indication, dosage and frequency of diabetic medications as prescribed; pt reports adherence and denies noted SE; Discussed and reviewed patient's understanding to increase her Tresiba to 16u at bedtime qd per Dr. Baird Cancer, Determined patient is taking Ozempic 0.25 mg on Wednesday and 0.5 mg on Sunday . Advised patient, providing education and rationale, to check cbg 1-2 times daily before meals and record, calling the CCM team and or PCP for findings outside established parameters . Discussed plans with patient for ongoing care management follow up and provided patient with direct contact information for care management team  Patient Self Care Activities:  . Self administers medications as prescribed . Attends all scheduled provider appointments . Calls pharmacy for medication refills . Performs ADL's independently . Performs IADL's independently . Calls provider office for new concerns or questions  Please see past updates related to this goal by clicking on the "Past Updates" button in the selected goal       .  "to get better blood pressure control" (pt-stated)      CARE PLAN ENTRY (see longitudinal plan of care for additional care plan information)  Current Barriers:  [name] Knowledge Deficits related to disease process and Self Health management of HTN . Chronic Disease Management support and education needs related to DM, CKD III, Hypertensive nephropathy  Nurse Case Manager Clinical Goal(s):  . New 12/16/19 Over the next 90 days, patient will work with the CCM team to address needs related to disease education and support to help improve Self Health management of HTN  CCM RN CM Interventions:  12/16/19 call completed with patient  . Inter-disciplinary care team collaboration (see longitudinal plan of care) . Evaluation of current treatment plan related to HTN and patient's adherence to plan as established by provider . Re-educated patient re:  target BP <130/80; Educated on ways to improve Self Health management of HTN by adhering to heart healthy, low Sodium diet, taking medications exactly as prescribed and implementing a routine exercise regimen, 150 minutes per week as tolerated . Reviewed medications with patient, educated on importance of adherence, patient's current regimen; o amLODipine (NORVASC) 10 MG tablet, TAKE 1 TABLET BY MOUTH EVERY DAY IN THE EVENING o propranolol ER (INDERAL LA) 120 MG 24 hr capsule, TAKE 1 CAPSULE BY MOUTH EVERY DAY o triamterene-hydrochlorothiazide (MAXZIDE-25) 37.5-25 MG tablet, TAKE 1 TABLET BY MOUTH EVERY DAY IN THE MORNING o hydrALAZINE (APRESOLINE) 25 MG tablet, One tab po twice daily . Determined patient continues to Self monitor her BP at home and is recording her Bp's, reports average BP has improved to 120's-140's over low 80's . Advised patient, providing education and rationale, to monitor blood pressure daily and record, calling the CCM team and PCP for findings outside established parameters . Discussed plans with patient for ongoing care management follow up and provided patient with direct contact information for care management team  Patient Self Care Activities:  . Self administers medications as prescribed . Attends all scheduled provider appointments . Calls pharmacy for medication refills . Performs ADL's independently . Performs IADL's independently . Calls provider office for new concerns or questions  Please see past updates related to this goal by clicking on  the "Past Updates" button in the selected goal      .  I need financial assistance for my medications (pt-stated)      Current Barriers:  . Financial Barriers: patient has [employer] and reports copay for Ozempic (>$500/month) is cost prohibitive at this time  Pharmacist Clinical Goal(s):  [name] Over the next 60 days, patient will work with PharmD and providers to relieve medication access concerns (goal updated due  to delays in processing with DSS due to [place])  Interventions: Call completed with patient on 01/20/19 . Comprehensive medication review completed; medication list updated in electronic medical record.  [name] by [employer]: Patient meets income/NO out of pocket spend criteria for this medication's patient assistance program. Reviewed application process. Patient provided proof of income, out of pocket spend report, and will sign application. Will collaborate with primary care provider, Dr. [name] for their portion of application. Once completed, will submit to [employer] patient assistance program. . Patient completed application in office.  Application faxed to company for approval.  Company denied application due to income, however patient to apply for LIS/extrahelp through medicare.  Assisted patient with application online per verbal permission.  Patient verbalized understanding.  [name] Consulted with [place] to assist patient in applying for [employer].  Application submitted week of 11/15/2018.  Can take up to 45 days for reply.  Patient denied for Medicaid/extra help.  Application prepared and submitted for 2021 on 01/20/2019 . Achieving this goal will allow patient to continue to control her diabetes.  CCM SW Interventions: Completed 01/13/2019  . Outbound call to the patient to review steps once letter received from [facility] . Determined the patient has received letter in the mail . Advised the patient to bring the letter to her primary providers office over the next week to drop off for embedded PharmD . Discussed importance of PharmD receiving letter to assist with patient assistance application for the patients DM medication . Collaboration with provider office informing of patients plan to drop letter off this afternoon . Collaboration with PharmD to communicate above interventions  Patient Self Care Activities:  . Patient will provide necessary portions  of application   Please see past updates related to this goal by clicking on the "Past Updates" button in the selected goal       .  Medication Assistance      CARE PLAN ENTRY (see longitudinal plan of care for additional care plan information)  Current Barriers:  . Financial Barriers: patient has [employer] and reports copay for [employer] [name] is cost prohibitive at this time  Pharmacist Clinical Goal(s):  [name] Over the next 30 days, patient will work with PharmD and providers to relieve medication access concerns  Interventions: . Comprehensive medication review completed; medication list updated in electronic medical record.  Bertram Savin care team collaboration (see longitudinal plan of care) . [name] and Ozempic by [employer]: Patient meets income criteria for this medication's patient assistance program. Both medications have been approved through 02/10/20. Collaborated with primary care provider Dr. [name] for their portion of application. . Completed application faxed to [employer] patient assistance program on 09/08/19  Patient Self Care Activities:  . Pick up samples of Tresiba at the office on Monday 8/2  Initial goal documentation     .  Patient Stated      03/22/2020, wants to weigh 220 pounds    .  Pharmacy Care Plan  CARE PLAN ENTRY (see longitudinal plan of care for additional care plan information)  Current Barriers:  . Chronic Disease Management support, education, and care coordination needs related to Hypertension, Hyperlipidemia, and Diabetes   Hypertension BP Readings from Last 3 Encounters:  08/23/19 (!) 148/86  08/04/19 (!) 160/92  06/16/19 (!) 172/96   . Pharmacist Clinical Goal(s): o Over the next 90 days, patient will work with PharmD and providers to achieve BP goal <130/80 . Current regimen:   Amlodipine 48m daily  Triamterene/ HCTZ 37.548m22mg daily  Propranolol ER 12056maily  Hydralazine 22m36mwice daily . Interventions: o Provided dietary and exercise recommendations o Patient stated that triamterene/HCTZ changed to chlorthalidone by Nephrologist due to hyperkalemia - Updated medication list o Encouraged increased water intake to 64 ounces daily . Patient self care activities - Over the next 90 days, patient will: o Check BP daily, document, and provide at future appointments o Ensure daily salt intake < 2300 mg/day o Exercise 30 minutes daily 5 days per week  Hyperlipidemia Lab Results  Component Value Date/Time   LDLCALC 80 03/17/2019 10:41 AM   . Pharmacist Clinical Goal(s): o Over the next 90 days, patient will work with PharmD and providers to achieve LDL goal < 70 . Current regimen:  o Atorvastatin 20mg55mly . Interventions: o Provided dietary and exercise recommendations o Discussed appropriate goals for LDL (less than 70) . Patient self care activities - Over the next 90 days, patient will: o Focus on a heart healthy diet o Exercise 30 minutes daily 5 days per week  Diabetes Lab Results  Component Value Date/Time   HGBA1C 8.9 (H) 06/16/2019 05:02 PM   HGBA1C 9.7 (H) 03/17/2019 10:41 AM   . Pharmacist Clinical Goal(s): o Over the next 90 days, patient will work with PharmD and providers to achieve A1c goal <7% . Current regimen:  o Tresiba 16 units daily o Ozempic 0.5mg o63m weekly- per patient taking a total of 0.75mg w34my on 2 separate days . Interventions: o Resubmitted patient assistance refill form for Ozempic o Discussed with patient that when shipment arrives it will be for Ozempic 1mg wee46m and she will only need to do 1 shot instead of 2 o Provided dietary and exercise recommendations . Patient self care activities - Over the next 90 days, patient will: o Check blood sugar once daily, document, and provide at future appointments o Contact provider with any episodes of hypoglycemia o Exercise 30 minutes daily 5 days per week  Medication  management . Pharmacist Clinical Goal(s): o Over the next 90 days, patient will work with PharmD and providers to maintain optimal medication adherence . Current pharmacy: [facility] . Interventions o Comprehensive medication review performed. o Continue current medication management strategy . Patient self care activities - Over the next 90 days, patient will: o Focus on medication adherence by continued use of pill box o Take medications as prescribed o Report any questions or concerns to PharmD and/or provider(s)  Please see past updates related to this goal by clicking on the "Past Updates" button in the selected goal      .  Weight (lb) < 200 lb (90.7 kg)      03/17/2019, wants to get down to 220 pounds      Depression Screen PHQ 2/9 Scores 03/22/2020 03/17/2019 05/27/2018 02/01/2018 12/29/2017 11/12/2017 07/02/2015  PHQ - 2 Score 0 0 0 0 0 0 0  PHQ- 9 Score - 0 0 - - - -  Fall Risk Fall Risk  03/22/2020 03/17/2019 09/01/2018 07/06/2018 05/27/2018  Falls in the past year? 0 0 0 0 0  Risk for fall due to : Impaired balance/gait;Impaired mobility;Medication side effect Medication side effect - - Medication side effect;Impaired balance/gait  Follow up Falls evaluation completed;Education provided;Falls prevention discussed Education provided;Falls prevention discussed - - Falls prevention discussed;Education provided    FALL RISK PREVENTION PERTAINING TO THE HOME:  Any stairs in or around the home? No  If so, are there any without handrails? n/a Home free of loose throw rugs in walkways, pet beds, electrical cords, etc? Yes  Adequate lighting in your home to reduce risk of falls? Yes   ASSISTIVE DEVICES UTILIZED TO PREVENT FALLS:  Life alert? No  Use of a cane, walker or w/c? Yes  Grab bars in the bathroom? No  Shower chair or bench in shower? No  Elevated toilet seat or a handicapped toilet? Yes   TIMED UP AND GO:  Was the test performed? No .     Gait slow and steady with  assistive device  Cognitive Function:     6CIT Screen 03/22/2020 03/17/2019 05/27/2018  What Year? 0 points 0 points 0 points  What month? 0 points 0 points 0 points  What time? 0 points 0 points 0 points  Count back from 20 2 points 0 points 0 points  Months in reverse 0 points 0 points 2 points  Repeat phrase 4 points 2 points 2 points  Total Score '6 2 4    ' Immunizations Immunization History  Administered Date(s) Administered  . Influenza, High Dose Seasonal PF 11/12/2017, 11/09/2018, 11/22/2019  . PFIZER(Purple Top)SARS-COV-2 Vaccination 04/04/2019, 04/25/2019, 12/07/2019  . Pneumococcal Polysaccharide-23 11/29/2015    TDAP status: Up to date  Flu Vaccine status: Up to date  Pneumococcal vaccine status: sent to pharmacy  Covid-19 vaccine status: Completed vaccines  Qualifies for Shingles Vaccine? Yes   Zostavax completed No   Shingrix Completed?: No.    Education has been provided regarding the importance of this vaccine. Patient has been advised to call insurance company to determine out of pocket expense if they have not yet received this vaccine. Advised may also receive vaccine at local pharmacy or Health Dept. Verbalized acceptance and understanding.  Screening Tests Health Maintenance  Topic Date Due  . PNA vac Low Risk Adult (2 of 2 - PCV13) 11/28/2016  . FOOT EXAM  12/14/2019  . HEMOGLOBIN A1C  12/17/2019  . OPHTHALMOLOGY EXAM  03/31/2020  . MAMMOGRAM  04/19/2021  . COLONOSCOPY (Pts 45-67yr Insurance coverage will need to be confirmed)  05/02/2024  . TETANUS/TDAP  08/12/2027  . INFLUENZA VACCINE  Completed  . DEXA SCAN  Completed  . COVID-19 Vaccine  Completed  . Hepatitis C Screening  Completed    Health Maintenance  Health Maintenance Due  Topic Date Due  . PNA vac Low Risk Adult (2 of 2 - PCV13) 11/28/2016  . FOOT EXAM  12/14/2019  . HEMOGLOBIN A1C  12/17/2019    Colorectal cancer screening: Type of screening: Colonoscopy. Completed 05/03/2014.  Repeat every 10 years  Mammogram status: Completed 04/20/2019. Repeat every year  Bone Density status: Completed 09/17/2016. Results reflect: Bone density results: NORMAL. Repeat every 0 years.  Lung Cancer Screening: (Low Dose CT Chest recommended if Age 74-80years, 30 pack-year currently smoking OR have quit w/in 15years.) does not qualify.   Lung Cancer Screening Referral: no  Additional Screening:  Hepatitis C Screening: does qualify; Completed 02/01/2018  Vision  Screening: Recommended annual ophthalmology exams for early detection of glaucoma and other disorders of the eye. Is the patient up to date with their annual eye exam?  No  Who is the provider or what is the name of the office in which the patient attends annual eye exams? Dr. Harrison Mons If pt is not established with a provider, would they like to be referred to a provider to establish care? No .   Dental Screening: Recommended annual dental exams for proper oral hygiene  Community Resource Referral / Chronic Care Management: CRR required this visit?  No   CCM required this visit?  No      Plan:     I have personally reviewed and noted the following in the patient's chart:   . Medical and social history . Use of alcohol, tobacco or illicit drugs  . Current medications and supplements . Functional ability and status . Nutritional status . Physical activity . Advanced directives . List of other physicians . Hospitalizations, surgeries, and ER visits in previous 12 months . Vitals . Screenings to include cognitive, depression, and falls . Referrals and appointments  In addition, I have reviewed and discussed with patient certain preventive protocols, quality metrics, and best practice recommendations. A written personalized care plan for preventive services as well as general preventive health recommendations were provided to patient.     Kellie Simmering, LPN   1/48/[id number]   Nurse Notes:

## 2020-03-22 NOTE — Progress Notes (Signed)
I,Teresa Stanley,acting as a Education administrator for Teresa Greenland, MD.,have documented all relevant documentation on the behalf of Teresa Greenland, MD,as directed by  Teresa Greenland, MD while in the presence of Teresa Greenland, MD.  This visit occurred during the SARS-CoV-2 public health emergency.  Safety protocols were in place, including screening questions prior to the visit, additional usage of staff PPE, and extensive cleaning of exam room while observing appropriate contact time as indicated for disinfecting solutions.  Subjective:     Patient ID: Teresa Stanley , female    DOB: [birth year] , 75 y.o.   MRN: [id number]   Chief Complaint  Patient presents with  . Diabetes  . Hypertension    HPI  She presents today for f/u DM/HTN f/u. She reports compliance with meds. She is excited to report that her sugars have improved.   Diabetes She presents for her follow-up diabetic visit. She has type 2 diabetes mellitus. Her disease course has been stable. There are no hypoglycemic associated symptoms. Pertinent negatives for diabetes include no blurred vision and no chest pain. There are no hypoglycemic complications. Diabetic complications include nephropathy. Risk factors for coronary artery disease include diabetes mellitus, dyslipidemia, obesity, post-menopausal and sedentary lifestyle. She is compliant with treatment some of the time. Her weight is decreasing steadily. She is following a generally healthy diet. She participates in exercise intermittently. Her breakfast blood glucose is taken between 8-9 am. Her breakfast blood glucose range is generally 130-140 mg/dl. An ACE inhibitor/angiotensin II receptor blocker is being taken. Eye exam is current.  Hypertension The current episode started more than 1 year ago. The problem has been gradually improving since onset. The problem is uncontrolled. Pertinent negatives include no blurred vision, chest pain, palpitations or shortness of breath. The  current treatment provides moderate improvement. Compliance problems include exercise.  Hypertensive end-organ damage includes kidney disease.     Past Medical History:  Diagnosis Date  . Arthritis   . Diabetes mellitus    takes Actos and Metformin daily and Victoza  . GERD (gastroesophageal reflux disease)    takes Omeprazole daily  . Headache(784.0)    occasionally  . Hyperlipidemia    takes Atorvastatin daily  . Hypertension    takes Lisinopril,Amlodipine,and Metoprolol daily  . Hypothyroidism   . Joint pain   . Thyroid disease    ?     Family History  Problem Relation Age of Onset  . Diabetes Mother   . Hypertension Mother   . Diabetes Father   . Asthma Father   . Hypertension Father   . Hypertension Daughter   . Hypertension Sister        2  . Diabetes Sister   . Hypertension Brother        5  . Kidney disease Brother   . Colon cancer Neg Hx   . Esophageal cancer Neg Hx   . Rectal cancer Neg Hx   . Stomach cancer Neg Hx      Current Outpatient Medications:  .  Semaglutide, 1 MG/DOSE, 4 MG/3ML SOPN, Inject 1 mg into the skin once a week., Disp: 3 mL, Rfl: 1 .  amLODipine (NORVASC) 10 MG tablet, TAKE 1 TABLET BY MOUTH EVERY DAY IN THE EVENING, Disp: 90 tablet, Rfl: 2 .  aspirin 81 MG tablet, Take 81 mg by mouth daily., Disp: , Rfl:  .  atorvastatin (LIPITOR) 20 MG tablet, TAKE 1 TABLET BY MOUTH EVERY DAY, Disp: 90 tablet, Rfl: 2 .  Blood Glucose Monitoring Suppl (ONE TOUCH ULTRA 2) w/Device KIT, Use as directed to check blood sugars 2 times per day dx:e11.22, Disp: 1 each, Rfl: 1 .  carboxymethylcellulose (REFRESH PLUS) 0.5 % SOLN, Place 1 drop into both eyes daily., Disp: , Rfl:  .  chlorthalidone (HYGROTON) 25 MG tablet, Take 25 mg by mouth daily., Disp: , Rfl:  .  cholecalciferol (VITAMIN D3) 25 MCG (1000 UNIT) tablet, Take 1,000 Units by mouth daily., Disp: , Rfl:  .  famotidine (PEPCID) 20 MG tablet, Take 20 mg by mouth daily., Disp: , Rfl:  .  gabapentin  (NEURONTIN) 100 MG capsule, TAKE 2 CAPSULES BY MOUTH AT BEDTIME, Disp: 180 capsule, Rfl: 2 .  hydrALAZINE (APRESOLINE) 25 MG tablet, TAKE 1 TABLET BY MOUTH TWICE A DAY, Disp: 60 tablet, Rfl: 1 .  insulin degludec (TRESIBA FLEXTOUCH) 100 UNIT/ML SOPN FlexTouch Pen, Inject 16 Units into the skin at bedtime. , Disp: , Rfl:  .  meclizine (ANTIVERT) 25 MG tablet, TAKE 1 TABLET (25 MG TOTAL) BY MOUTH 3 (THREE) TIMES DAILY AS NEEDED FOR DIZZINESS., Disp: 30 tablet, Rfl: 0 .  methimazole (TAPAZOLE) 5 MG tablet, TAKE 1 TABLET BY MOUTH EVERY DAY, Disp: 90 tablet, Rfl: 1 .  OneTouch Delica Lancets 02T MISC, Use as directed to check blood sugars 2 times per day dx: e11.22, Disp: 300 each, Rfl: 2 .  ONETOUCH ULTRA test strip, USE AS INSTRUCTED TO CHECK BLOOD SUGARS 2 TIMES PER DAY DX: E11.22, Disp: 200 strip, Rfl: 4 .  propranolol ER (INDERAL LA) 120 MG 24 hr capsule, TAKE 1 CAPSULE BY MOUTH EVERY DAY, Disp: 90 capsule, Rfl: 0 .  triamcinolone cream (KENALOG) 0.1 %, Apply 1 application topically 2 (two) times daily. Apply, Disp: 45 g, Rfl: 1   No Known Allergies   Review of Systems  Constitutional: Negative.   Eyes: Negative for blurred vision.  Respiratory: Negative.  Negative for shortness of breath.   Cardiovascular: Negative.  Negative for chest pain and palpitations.  Gastrointestinal: Negative.   Neurological: Negative.   Psychiatric/Behavioral: Negative.      Today's Vitals   03/22/20 0955  BP: (!) 160/110  Pulse: 92  Temp: 97.8 F (36.6 C)  TempSrc: Oral  Weight: 261 lb 12.8 oz (118.8 kg)  Height: _0  (1.575 m)   Body mass index is 47.88 kg/m.  Wt Readings from Last 3 Encounters:  03/22/20 261 lb 12.8 oz (118.8 kg)  03/22/20 261 lb 12.8 oz (118.8 kg)  08/23/19 268 lb 6.4 oz (121.7 kg)   Objective:  Physical Exam Vitals and nursing note reviewed.  Constitutional:      Appearance: Normal appearance. She is obese.  HENT:     Head: Normocephalic and atraumatic.     Nose:      Comments: Masked     Mouth/Throat:     Comments: Masked  Cardiovascular:     Rate and Rhythm: Normal rate and regular rhythm.     Heart sounds: Normal heart sounds.  Pulmonary:     Effort: Pulmonary effort is normal.     Breath sounds: Normal breath sounds.  Musculoskeletal:     Cervical back: Normal range of motion.     Comments: Ambulatory with cane  Skin:    General: Skin is warm.  Neurological:     General: No focal deficit present.     Mental Status: She is alert.  Psychiatric:        Mood and Affect: Mood normal.  Behavior: Behavior normal.         Assessment And Plan:     1. Type 2 diabetes mellitus with stage 3b chronic kidney disease, with long-term current use of insulin (HCC) Comments: Chronic. I will check labs as listed below. She was congratulated on her lifestyle changes and encouraged to keep up the great work.  - Hemoglobin A1c - CMP14+EGFR - Lipid panel - CBC no Diff - Protein electrophoresis, serum - Parathyroid Hormone, Intact w/Ca - Phosphorus  2. Hypertensive nephropathy Comments: Uncontrolled.  Importance of following low sodium diet was stressed to the patient. I will also increase hydralazine to THREE times daily.  - CMP14+EGFR - Lipid panel  3. Hyperthyroidism Comments: Chronic. I will check thyroid panel and adjsut meds as needed.  - TSH - T4, Free  4. Class 3 severe obesity due to excess calories with serious comorbidity and body mass index (BMI) of 45.0 to 49.9 in adult [facility])  She was congratulated on her 7 pound weight loss and encouraged to keep up the great work. She is encouraged to initially  strive for BMI less than 40 to decrease cardiac risk. Advised to aim for at least 150 minutes of exercise per week.   Patient was given opportunity to ask questions. Patient verbalized understanding of the plan and was able to repeat key elements of the plan. All questions were answered to their satisfaction.   I, Teresa Greenland, MD,  have reviewed all documentation for this visit. The documentation on 04/08/20 for the exam, diagnosis, procedures, and orders are all accurate and complete.  THE PATIENT IS ENCOURAGED TO PRACTICE SOCIAL DISTANCING DUE TO THE COVID-19 PANDEMIC.

## 2020-03-22 NOTE — Patient Instructions (Addendum)
Please increase hydralazine to THREE times per day   Diabetes Mellitus and [place] care is an important part of your health, especially when you have diabetes. Diabetes may cause you to have problems because of poor blood flow (circulation) to your feet and legs, which can cause your skin to:  Become thinner and drier.  Break more easily.  Heal more slowly.  Peel and crack. You may also have nerve damage (neuropathy) in your legs and feet, causing decreased feeling in them. This means that you may not notice minor injuries to your feet that could lead to more serious problems. Noticing and addressing any potential problems early is the best way to prevent future foot problems. How to care for your feet Foot hygiene  Wash your feet daily with warm water and mild soap. Do not use hot water. Then, pat your feet and the areas between your toes until they are completely dry. Do not soak your feet as this can dry your skin.  Trim your toenails straight across. Do not dig under them or around the cuticle. File the edges of your nails with an emery board or nail file.  Apply a moisturizing lotion or petroleum jelly to the skin on your feet and to dry, brittle toenails. Use lotion that does not contain alcohol and is unscented. Do not apply lotion between your toes.   Shoes and socks  Wear clean socks or stockings every day. Make sure they are not too tight. Do not wear knee-high stockings since they may decrease blood flow to your legs.  Wear shoes that fit properly and have enough cushioning. Always look in your shoes before you put them on to be sure there are no objects inside.  To break in new shoes, wear them for just a few hours a day. This prevents injuries on your feet. Wounds, scrapes, corns, and calluses  Check your feet daily for blisters, cuts, bruises, sores, and redness. If you cannot see the bottom of your feet, use a mirror or ask someone for help.  Do not cut corns or  calluses or try to remove them with medicine.  If you find a minor scrape, cut, or break in the skin on your feet, keep it and the skin around it clean and dry. You may clean these areas with mild soap and water. Do not clean the area with peroxide, alcohol, or iodine.  If you have a wound, scrape, corn, or callus on your foot, look at it several times a day to make sure it is healing and not infected. Check for: ? Redness, swelling, or pain. ? Fluid or blood. ? Warmth. ? Pus or a bad smell.   General tips  Do not cross your legs. This may decrease blood flow to your feet.  Do not use heating pads or hot water bottles on your feet. They may burn your skin. If you have lost feeling in your feet or legs, you may not know this is happening until it is too late.  Protect your feet from hot and cold by wearing shoes, such as at the beach or on hot pavement.  Schedule a complete foot exam at least once a year (annually) or more often if you have foot problems. Report any cuts, sores, or bruises to your health care provider immediately. Where to find more information  American Diabetes Association: www.diabetes.org  Association of Diabetes Care & Education Specialists: www.diabeteseducator.org Contact a health care provider if:  You have  a medical condition that increases your risk of infection and you have any cuts, sores, or bruises on your feet.  You have an injury that is not healing.  You have redness on your legs or feet.  You feel burning or tingling in your legs or feet.  You have pain or cramps in your legs and feet.  Your legs or feet are numb.  Your feet always feel cold.  You have pain around any toenails. Get help right away if:  You have a wound, scrape, corn, or callus on your foot and: ? You have pain, swelling, or redness that gets worse. ? You have fluid or blood coming from the wound, scrape, corn, or callus. ? Your wound, scrape, corn, or callus feels warm to  the touch. ? You have pus or a bad smell coming from the wound, scrape, corn, or callus. ? You have a fever. ? You have a red line going up your leg. Summary  Check your feet every day for blisters, cuts, bruises, sores, and redness.  Apply a moisturizing lotion or petroleum jelly to the skin on your feet and to dry, brittle toenails.  Wear shoes that fit properly and have enough cushioning.  If you have foot problems, report any cuts, sores, or bruises to your health care provider immediately.  Schedule a complete foot exam at least once a year (annually) or more often if you have foot problems. This information is not intended to replace advice given to you by your health care provider. Make sure you discuss any questions you have with your health care provider. Document Revised: 08/18/2019 Document Reviewed: 08/18/2019 Elsevier Patient Education  [employer].

## 2020-03-22 NOTE — Patient Instructions (Signed)
Teresa Stanley , Thank you for taking time to come for your Medicare Wellness Visit. I appreciate your ongoing commitment to your health goals. Please review the following plan we discussed and let me know if I can assist you in the future.   Screening recommendations/referrals: Colonoscopy: completed 05/03/2014 Mammogram: completed 04/20/2019 Bone Density: completed 09/17/2016 Recommended yearly ophthalmology/optometry visit for glaucoma screening and checkup Recommended yearly dental visit for hygiene and checkup  Vaccinations: Influenza vaccine: completed 10/12/202, due 09/10/2020 Pneumococcal vaccine: sent to pharmacy Tdap vaccine: completed 08/11/2017, due 08/12/2027 Shingles vaccine: sent to pharmacy   Covid-19: 12/07/2019, 04/25/2019, 04/04/2019  Advanced directives: Please bring a copy of your POA (Power of Attorney) and/or Living Will to your next appointment.   Conditions/risks identified: none  Next appointment: Follow up in one year for your annual wellness visit    Preventive Care 65 Years and Older, Female Preventive care refers to lifestyle choices and visits with your health care provider that can promote health and wellness. What does preventive care include?  A yearly physical exam. This is also called an annual well check.  Dental exams once or twice a year.  Routine eye exams. Ask your health care provider how often you should have your eyes checked.  Personal lifestyle choices, including:  Daily care of your teeth and gums.  Regular physical activity.  Eating a healthy diet.  Avoiding tobacco and drug use.  Limiting alcohol use.  Practicing safe sex.  Taking low-dose aspirin every day.  Taking vitamin and mineral supplements as recommended by your health care provider. What happens during an annual well check? The services and screenings done by your health care provider during your annual well check will depend on your age, overall health, lifestyle risk  factors, and family history of disease. Counseling  Your health care provider may ask you questions about your:  Alcohol use.  Tobacco use.  Drug use.  Emotional well-being.  Home and relationship well-being.  Sexual activity.  Eating habits.  History of falls.  Memory and ability to understand (cognition).  Work and work Statistician.  Reproductive health. Screening  You may have the following tests or measurements:  Height, weight, and BMI.  Blood pressure.  Lipid and cholesterol levels. These may be checked every 5 years, or more frequently if you are over 74 years old.  Skin check.  Lung cancer screening. You may have this screening every year starting at age 74 if you have a 30-pack-year history of smoking and currently smoke or have quit within the past 15 years.  Fecal occult blood test (FOBT) of the stool. You may have this test every year starting at age 35.  Flexible sigmoidoscopy or colonoscopy. You may have a sigmoidoscopy every 5 years or a colonoscopy every 10 years starting at age 74.  Hepatitis C blood test.  Hepatitis B blood test.  Sexually transmitted disease (STD) testing.  Diabetes screening. This is done by checking your blood sugar (glucose) after you have not eaten for a while (fasting). You may have this done every 1-3 years.  Bone density scan. This is done to screen for osteoporosis. You may have this done starting at age 74.  Mammogram. This may be done every 1-2 years. Talk to your health care provider about how often you should have regular mammograms. Talk with your health care provider about your test results, treatment options, and if necessary, the need for more tests. Vaccines  Your health care provider may recommend certain vaccines, such  as:  Influenza vaccine. This is recommended every year.  Tetanus, diphtheria, and acellular pertussis (Tdap, Td) vaccine. You may need a Td booster every 10 years.  Zoster vaccine. You  may need this after age 13.  Pneumococcal 13-valent conjugate (PCV13) vaccine. One dose is recommended after age 74.  Pneumococcal polysaccharide (PPSV23) vaccine. One dose is recommended after age 77. Talk to your health care provider about which screenings and vaccines you need and how often you need them. This information is not intended to replace advice given to you by your health care provider. Make sure you discuss any questions you have with your health care provider. Document Released: 02/23/2015 Document Revised: 10/17/2015 Document Reviewed: 11/28/2014 Elsevier Interactive Patient Education  2017 [place] Prevention in the Home Falls can cause injuries. They can happen to people of all ages. There are many things you can do to make your home safe and to help prevent falls. What can I do on the outside of my home?  Regularly fix the edges of walkways and driveways and fix any cracks.  Remove anything that might make you trip as you walk through a door, such as a raised step or threshold.  Trim any bushes or trees on the path to your home.  Use bright outdoor lighting.  Clear any walking paths of anything that might make someone trip, such as rocks or tools.  Regularly check to see if handrails are loose or broken. Make sure that both sides of any steps have handrails.  Any raised decks and porches should have guardrails on the edges.  Have any leaves, snow, or ice cleared regularly.  Use sand or salt on walking paths during winter.  Clean up any spills in your garage right away. This includes oil or grease spills. What can I do in the bathroom?  Use night lights.  Install grab bars by the toilet and in the tub and shower. Do not use towel bars as grab bars.  Use non-skid mats or decals in the tub or shower.  If you need to sit down in the shower, use a plastic, non-slip stool.  Keep the floor dry. Clean up any water that spills on the floor as soon as  it happens.  Remove soap buildup in the tub or shower regularly.  Attach bath mats securely with double-sided non-slip rug tape.  Do not have throw rugs and other things on the floor that can make you trip. What can I do in the bedroom?  Use night lights.  Make sure that you have a light by your bed that is easy to reach.  Do not use any sheets or blankets that are too big for your bed. They should not hang down onto the floor.  Have a firm chair that has side arms. You can use this for support while you get dressed.  Do not have throw rugs and other things on the floor that can make you trip. What can I do in the kitchen?  Clean up any spills right away.  Avoid walking on wet floors.  Keep items that you use a lot in easy-to-reach places.  If you need to reach something above you, use a strong step stool that has a grab bar.  Keep electrical cords out of the way.  Do not use floor polish or wax that makes floors slippery. If you must use wax, use non-skid floor wax.  Do not have throw rugs and other things on the  floor that can make you trip. What can I do with my stairs?  Do not leave any items on the stairs.  Make sure that there are handrails on both sides of the stairs and use them. Fix handrails that are broken or loose. Make sure that handrails are as long as the stairways.  Check any carpeting to make sure that it is firmly attached to the stairs. Fix any carpet that is loose or worn.  Avoid having throw rugs at the top or bottom of the stairs. If you do have throw rugs, attach them to the floor with carpet tape.  Make sure that you have a light switch at the top of the stairs and the bottom of the stairs. If you do not have them, ask someone to add them for you. What else can I do to help prevent falls?  Wear shoes that:  Do not have high heels.  Have rubber bottoms.  Are comfortable and fit you well.  Are closed at the toe. Do not wear sandals.  If  you use a stepladder:  Make sure that it is fully opened. Do not climb a closed stepladder.  Make sure that both sides of the stepladder are locked into place.  Ask someone to hold it for you, if possible.  Clearly mark and make sure that you can see:  Any grab bars or handrails.  First and last steps.  Where the edge of each step is.  Use tools that help you move around (mobility aids) if they are needed. These include:  Canes.  Walkers.  Scooters.  Crutches.  Turn on the lights when you go into a dark area. Replace any light bulbs as soon as they burn out.  Set up your furniture so you have a clear path. Avoid moving your furniture around.  If any of your floors are uneven, fix them.  If there are any pets around you, be aware of where they are.  Review your medicines with your doctor. Some medicines can make you feel dizzy. This can increase your chance of falling. Ask your doctor what other things that you can do to help prevent falls. This information is not intended to replace advice given to you by your health care provider. Make sure you discuss any questions you have with your health care provider. Document Released: 11/23/2008 Document Revised: 07/05/2015 Document Reviewed: 03/03/2014 Elsevier Interactive Patient Education  2017 [employer].

## 2020-03-23 ENCOUNTER — Telehealth: Payer: Medicare Other

## 2020-03-23 ENCOUNTER — Other Ambulatory Visit: Payer: Self-pay

## 2020-03-23 LAB — CMP14+EGFR
ALT: 12 IU/L (ref 0–32)
AST: 13 IU/L (ref 0–40)
Albumin/Globulin Ratio: 1.4 (ref 1.2–2.2)
Albumin: 4.3 g/dL (ref 3.7–4.7)
Alkaline Phosphatase: 157 IU/L — ABNORMAL HIGH (ref 44–121)
BUN/Creatinine Ratio: 17 (ref 12–28)
BUN: 24 mg/dL (ref 8–27)
Bilirubin Total: 0.4 mg/dL (ref 0.0–1.2)
CO2: 23 mmol/L (ref 20–29)
Calcium: 9.8 mg/dL (ref 8.7–10.3)
Chloride: 98 mmol/L (ref 96–106)
Creatinine, Ser: 1.39 mg/dL — ABNORMAL HIGH (ref 0.57–1.00)
GFR calc Af Amer: 43 mL/min/{1.73_m2} — ABNORMAL LOW (ref >59)
GFR calc non Af Amer: 38 mL/min/{1.73_m2} — ABNORMAL LOW (ref >59)
Globulin, Total: 3.1 g/dL (ref 1.5–4.5)
Glucose: 187 mg/dL — ABNORMAL HIGH (ref 65–99)
Potassium: 4.7 mmol/L (ref 3.5–5.2)
Sodium: 140 mmol/L (ref 134–144)
Total Protein: 7.4 g/dL (ref 6.0–8.5)

## 2020-03-23 LAB — PROTEIN ELECTROPHORESIS, SERUM
A/G Ratio: 1.1 (ref 0.7–1.7)
Albumin ELP: 3.8 g/dL (ref 2.9–4.4)
Alpha 1: 0.3 g/dL (ref 0.0–0.4)
Alpha 2: 1 g/dL (ref 0.4–1.0)
Beta: 1.1 g/dL (ref 0.7–1.3)
Gamma Globulin: 1.2 g/dL (ref 0.4–1.8)
Globulin, Total: 3.6 g/dL (ref 2.2–3.9)

## 2020-03-23 LAB — LIPID PANEL
Chol/HDL Ratio: 2.7 ratio (ref 0.0–4.4)
Cholesterol, Total: 175 mg/dL (ref 100–199)
HDL: 65 mg/dL (ref >39)
LDL Chol Calc (NIH): 94 mg/dL (ref 0–99)
Triglycerides: 86 mg/dL (ref 0–149)
VLDL Cholesterol Cal: 16 mg/dL (ref 5–40)

## 2020-03-23 LAB — CBC
Hematocrit: 41.2 % (ref 34.0–46.6)
Hemoglobin: 12.9 g/dL (ref 11.1–15.9)
MCH: 28.1 pg (ref 26.6–33.0)
MCHC: 31.3 g/dL — ABNORMAL LOW (ref 31.5–35.7)
MCV: 90 fL (ref 79–97)
Platelets: 305 10*3/uL (ref 150–450)
RBC: 4.59 x10E6/uL (ref 3.77–5.28)
RDW: 13.6 % (ref 11.7–15.4)
WBC: 7.5 10*3/uL (ref 3.4–10.8)

## 2020-03-23 LAB — TSH: TSH: 2.93 u[IU]/mL (ref 0.450–4.500)

## 2020-03-23 LAB — PHOSPHORUS: Phosphorus: 3.5 mg/dL (ref 3.0–4.3)

## 2020-03-23 LAB — PTH, INTACT AND CALCIUM: PTH: 23 pg/mL (ref 15–65)

## 2020-03-23 LAB — T4, FREE: Free T4: 1.73 ng/dL (ref 0.82–1.77)

## 2020-03-23 LAB — HEMOGLOBIN A1C
Est. average glucose Bld gHb Est-mCnc: 163 mg/dL
Hgb A1c MFr Bld: 7.3 % — ABNORMAL HIGH (ref 4.8–5.6)

## 2020-03-23 MED ORDER — PREVNAR 20 0.5 ML IM SUSY: 0.5000 mL | PREFILLED_SYRINGE | Freq: Once | INTRAMUSCULAR | 0 refills | Status: AC

## 2020-03-26 NOTE — Progress Notes (Signed)
Does this mean she has been taking daily? Also, were labs faxed to Dr. Candiss Norse?

## 2020-03-27 ENCOUNTER — Other Ambulatory Visit: Payer: Self-pay

## 2020-03-27 MED ORDER — PREVNAR 20 0.5 ML IM SUSY: 0.5000 mg | PREFILLED_SYRINGE | Freq: Once | INTRAMUSCULAR | 0 refills | Status: AC

## 2020-04-02 DIAGNOSIS — N2581 Secondary hyperparathyroidism of renal origin: Secondary | ICD-10-CM | POA: Diagnosis not present

## 2020-04-02 DIAGNOSIS — I129 Hypertensive chronic kidney disease with stage 1 through stage 4 chronic kidney disease, or unspecified chronic kidney disease: Secondary | ICD-10-CM | POA: Diagnosis not present

## 2020-04-02 DIAGNOSIS — E872 Acidosis: Secondary | ICD-10-CM | POA: Diagnosis not present

## 2020-04-02 DIAGNOSIS — R809 Proteinuria, unspecified: Secondary | ICD-10-CM | POA: Diagnosis not present

## 2020-04-02 DIAGNOSIS — E875 Hyperkalemia: Secondary | ICD-10-CM | POA: Diagnosis not present

## 2020-04-02 DIAGNOSIS — N1832 Chronic kidney disease, stage 3b: Secondary | ICD-10-CM | POA: Diagnosis not present

## 2020-04-03 ENCOUNTER — Other Ambulatory Visit: Payer: Self-pay | Admitting: Internal Medicine

## 2020-04-20 ENCOUNTER — Other Ambulatory Visit: Payer: Self-pay | Admitting: Internal Medicine

## 2020-04-26 ENCOUNTER — Telehealth: Payer: Self-pay

## 2020-04-26 ENCOUNTER — Other Ambulatory Visit: Payer: Self-pay | Admitting: Internal Medicine

## 2020-04-26 NOTE — Chronic Care Management (AMB) (Signed)
    Chronic Care Management Pharmacy Assistant   Name: Teresa Stanley  MRN: [id number] DOB: 23-Sep-1945  Reason for Encounter: CCM Appointment Request Call-Patient Assistance Coordination.   Medications: Outpatient Encounter Medications as of 04/26/2020  Medication Sig  . amLODipine (NORVASC) 10 MG tablet TAKE 1 TABLET BY MOUTH EVERY DAY IN THE EVENING  . aspirin 81 MG tablet Take 81 mg by mouth daily.  [name] atorvastatin (LIPITOR) 20 MG tablet TAKE 1 TABLET BY MOUTH EVERY DAY  . Blood Glucose Monitoring Suppl (ONE TOUCH ULTRA 2) w/Device KIT Use as directed to check blood sugars 2 times per day dx:e11.22  . carboxymethylcellulose (REFRESH PLUS) 0.5 % SOLN Place 1 drop into both eyes daily.  . chlorthalidone (HYGROTON) 25 MG tablet Take 25 mg by mouth daily.  . cholecalciferol (VITAMIN D3) 25 MCG (1000 UNIT) tablet Take 1,000 Units by mouth daily.  . famotidine (PEPCID) 20 MG tablet Take 20 mg by mouth daily.  [name] gabapentin (NEURONTIN) 100 MG capsule TAKE 2 CAPSULES BY MOUTH AT BEDTIME  . hydrALAZINE (APRESOLINE) 25 MG tablet TAKE 1 TABLET BY MOUTH TWICE A DAY  . insulin degludec (TRESIBA FLEXTOUCH) 100 UNIT/ML SOPN FlexTouch Pen Inject 16 Units into the skin at bedtime.   . meclizine (ANTIVERT) 25 MG tablet TAKE 1 TABLET (25 MG TOTAL) BY MOUTH 3 (THREE) TIMES DAILY AS NEEDED FOR DIZZINESS.  . methimazole (TAPAZOLE) 5 MG tablet TAKE 1 TABLET BY MOUTH EVERY DAY  . OneTouch Delica Lancets 01U MISC Use as directed to check blood sugars 2 times per day dx: e11.22  . ONETOUCH ULTRA test strip USE AS INSTRUCTED TO CHECK BLOOD SUGARS 2 TIMES PER DAY DX: E11.22  . propranolol ER (INDERAL LA) 120 MG 24 hr capsule TAKE 1 CAPSULE BY MOUTH EVERY DAY  . Semaglutide, 1 MG/DOSE, 4 MG/3ML SOPN Inject 1 mg into the skin once a week.  . triamcinolone cream (KENALOG) 0.1 % Apply 1 application topically 2 (two) times daily. Apply   No facility-administered encounter medications on file as of 04/26/2020.     Note:Called the patient to schedule a follow up with [name], CPP. Patient confirmed CCM Call Appointment on Wednesday 05/16/20 at 11:00 AM. During the telephone call patient voiced she has one dose of her Ozempic left for this week and will need samples to pick up from PCP's office tomorrow if possible. I told the patient I will need to reach out to Dr. [facility] nurse staff and verify if Ozempic samples are available and will call [place] to find out her application status. The patient verbalized understanding.  Called and spoke with a [employer] representative who informed me the patient's max dose on application was incorrect and the sig will need to be indicated. I voiced to the representative will get this completed as soon as possible for the patient.  Received a staff message from [employer]. Per Sydnee Cabal; patient can come by to pick up samples.  Called the patient to notify her Ozempic samples are available at the office and can pick up tomorrow as requested. Updated the patient on application status and informed her we will resend the application with the correct max dose. The patient verbalized understanding.  Teams message sent to [facility] regarding patient's assistance application.   [name], CPP Notified.   Raynelle Highland, [place] Pharmacist Assistant [phone] CCM Total Time: 9 minutes (Novo Nordisk: 47 minutes)

## 2020-04-30 DIAGNOSIS — E119 Type 2 diabetes mellitus without complications: Secondary | ICD-10-CM | POA: Diagnosis not present

## 2020-04-30 DIAGNOSIS — H353110 Nonexudative age-related macular degeneration, right eye, stage unspecified: Secondary | ICD-10-CM | POA: Diagnosis not present

## 2020-04-30 DIAGNOSIS — H43821 Vitreomacular adhesion, right eye: Secondary | ICD-10-CM | POA: Diagnosis not present

## 2020-04-30 LAB — HM DIABETES EYE EXAM

## 2020-05-02 ENCOUNTER — Inpatient Hospital Stay: Admission: RE | Admit: 2020-05-02 | Payer: Medicare Other | Source: Ambulatory Visit

## 2020-05-03 ENCOUNTER — Telehealth: Payer: Medicare Other

## 2020-05-14 ENCOUNTER — Telehealth: Payer: Medicare Other

## 2020-05-15 ENCOUNTER — Telehealth: Payer: Self-pay

## 2020-05-15 NOTE — Progress Notes (Signed)
05/15/20-Attempted to outreach the patient on home telephone number, patient did not answer, unable to leave a voicemail due to mailbox not being set up.  Attempted to contact the patient's son, left a voicemail with appointment date and time for the patient and to make the patient aware to have medications and supplements near during phone visit.  [name], CPP Notified.  Raynelle Highland, [place] Pharmacist Assistant [phone] CCM Total Time: 12 minutes

## 2020-05-16 ENCOUNTER — Ambulatory Visit (INDEPENDENT_AMBULATORY_CARE_PROVIDER_SITE_OTHER): Payer: Medicare Other

## 2020-05-16 ENCOUNTER — Ambulatory Visit: Payer: Medicare Other

## 2020-05-16 ENCOUNTER — Other Ambulatory Visit: Payer: Self-pay

## 2020-05-16 DIAGNOSIS — E1122 Type 2 diabetes mellitus with diabetic chronic kidney disease: Secondary | ICD-10-CM | POA: Diagnosis not present

## 2020-05-16 DIAGNOSIS — I10 Essential (primary) hypertension: Secondary | ICD-10-CM | POA: Diagnosis not present

## 2020-05-16 DIAGNOSIS — I129 Hypertensive chronic kidney disease with stage 1 through stage 4 chronic kidney disease, or unspecified chronic kidney disease: Secondary | ICD-10-CM

## 2020-05-16 DIAGNOSIS — N1832 Chronic kidney disease, stage 3b: Secondary | ICD-10-CM

## 2020-05-16 DIAGNOSIS — Z794 Long term (current) use of insulin: Secondary | ICD-10-CM | POA: Diagnosis not present

## 2020-05-17 NOTE — Progress Notes (Signed)
Chronic Care Management Pharmacy Note  05/23/2020 Name:  Teresa Stanley MRN:  [id number] DOB:  [birth year]  Subjective: Teresa Stanley is an 75 y.o. year old female who is a primary patient of [name], MD.  The CCM team was consulted for assistance with disease management and care coordination needs.    Engaged with patient face to face for follow up visit in response to provider referral for pharmacy case management and/or care coordination services.   Consent to Services:  The patient was given information about Chronic Care Management services, agreed to services, and gave verbal consent prior to initiation of services.  Please see initial visit note for detailed documentation.   Patient Care Team: [name], MD as PCP - General (Internal Medicine) Lynne Logan, RN as Case Manager [place], Kennieth Francois, [facility] (Inactive) (Pharmacist)  Recent office visits: 03/22/2020 PCP OV - Hydralazine increased to three times daily     Hospital visits: None in previous 6 months  Objective:  Lab Results  Component Value Date   CREATININE 1.39 (H) 03/22/2020   BUN 24 03/22/2020   GFR 39.42 (L) 12/25/2017   GFRNONAA 38 (L) 03/22/2020   GFRAA 43 (L) 03/22/2020   NA 140 03/22/2020   K 4.7 03/22/2020   CALCIUM 9.8 03/22/2020   CO2 23 03/22/2020   GLUCOSE 187 (H) 03/22/2020    Lab Results  Component Value Date/Time   HGBA1C 7.3 (H) 03/22/2020 11:12 AM   HGBA1C 8.9 (H) 06/16/2019 05:02 PM   GFR 39.42 (L) 12/25/2017 03:08 PM   MICROALBUR 80 06/16/2019 02:39 PM   MICROALBUR 150 03/17/2019 11:53 AM    Last diabetic Eye exam:  Lab Results  Component Value Date/Time   HMDIABEYEEXA No Retinopathy 04/30/2020 12:00 AM    Last diabetic Foot exam: No results found for: HMDIABFOOTEX   Lab Results  Component Value Date   CHOL 175 03/22/2020   HDL 65 03/22/2020   LDLCALC 94 03/22/2020   TRIG 86 03/22/2020   CHOLHDL 2.7 03/22/2020    Hepatic Function Latest Ref Rng & Units  03/22/2020 06/16/2019 03/17/2019  Total Protein 6.0 - 8.5 g/dL 7.4 7.4 7.1  Albumin 3.7 - 4.7 g/dL 4.3 - 4.3  AST 0 - 40 IU/L 13 - 15  ALT 0 - 32 IU/L 12 - 10  Alk Phosphatase 44 - 121 IU/L 157(H) - 175(H)  Total Bilirubin 0.0 - 1.2 mg/dL 0.4 - 0.4    Lab Results  Component Value Date/Time   TSH 2.930 03/22/2020 11:12 AM   TSH 0.005 (L) 08/23/2009 07:54 PM   FREET4 1.73 03/22/2020 11:12 AM   FREET4 1.33 08/23/2009 07:54 PM    CBC Latest Ref Rng & Units 03/22/2020 06/16/2019 11/12/2017  WBC 3.4 - 10.8 x10E3/uL 7.5 7.9 7.6  Hemoglobin 11.1 - 15.9 g/dL 12.9 13.2 11.0(L)  Hematocrit 34.0 - 46.6 % 41.2 40.9 35.9  Platelets 150 - 450 x10E3/uL 305 271 308    No results found for: VD25OH  Clinical ASCVD: No  The 10-year ASCVD risk score Mikey Bussing DC Jr., et al., 2013) is: 38.4%   Values used to calculate the score:     Age: 35 years     Sex: Female     Is Non-Hispanic African American: Yes     Diabetic: Yes     Tobacco smoker: No     Systolic Blood Pressure: 235 mmHg     Is BP treated: Yes     HDL Cholesterol: 65 mg/dL  Total Cholesterol: 175 mg/dL    Depression screen [facility] 2/9 03/22/2020 03/17/2019 05/27/2018  Decreased Interest 0 0 0  Down, Depressed, Hopeless 0 0 0  PHQ - 2 Score 0 0 0  Altered sleeping - 0 0  Tired, decreased energy - 0 0  Change in appetite - 0 0  Feeling bad or failure about yourself  - 0 0  Trouble concentrating - 0 0  Moving slowly or fidgety/restless - 0 0  Suicidal thoughts - 0 0  PHQ-9 Score - 0 0  Difficult doing work/chores - Not difficult at all -      Social History   Tobacco Use  Smoking Status Never Smoker  Smokeless Tobacco Never Used   BP Readings from Last 3 Encounters:  03/22/20 (!) 160/110  03/22/20 (!) 160/110  08/23/19 (!) 148/86   Pulse Readings from Last 3 Encounters:  03/22/20 92  03/22/20 92  08/23/19 90   Wt Readings from Last 3 Encounters:  03/22/20 261 lb 12.8 oz (118.8 kg)  03/22/20 261 lb 12.8 oz (118.8 kg)  08/23/19  268 lb 6.4 oz (121.7 kg)   BMI Readings from Last 3 Encounters:  03/22/20 47.88 kg/m  03/22/20 47.88 kg/m  08/23/19 49.73 kg/m    Assessment/Interventions: Review of patient past medical history, allergies, medications, health status, including review of consultants reports, laboratory and other test data, was performed as part of comprehensive evaluation and provision of chronic care management services.   SDOH:  (Social Determinants of Health) assessments and interventions performed: No  SDOH Screenings   Alcohol Screen: Not on file  Depression (PHQ2-9): Low Risk   . PHQ-2 Score: 0  Financial Resource Strain: Low Risk   . Difficulty of Paying Living Expenses: Not hard at all  Food Insecurity: No Food Insecurity  . Worried About Charity fundraiser in the Last Year: Never true  . Ran Out of Food in the Last Year: Never true  Housing: Not on file  Physical Activity: Sufficiently Active  . Days of Exercise per Week: 5 days  . Minutes of Exercise per Session: 30 min  Social Connections: Not on file  Stress: No Stress Concern Present  . Feeling of Stress : Not at all  Tobacco Use: Low Risk   . Smoking Tobacco Use: Never Smoker  . Smokeless Tobacco Use: Never Used  Transportation Needs: No Transportation Needs  . Lack of Transportation (Medical): No  . Lack of Transportation (Non-Medical): No    CCM Care Plan  No Known Allergies  Medications Reviewed Today    Reviewed by Mayford Knife, RPH (Pharmacist) on 05/16/20 at 1146  Med List Status: <None>  Medication Order Taking? Sig Documenting Provider Last Dose Status Informant  amLODipine (NORVASC) 10 MG tablet [id number] No TAKE 1 TABLET BY MOUTH EVERY DAY IN THE Fredric Mare, MD Taking Active   aspirin 81 MG tablet [id number] No Take 81 mg by mouth daily. [provider] Taking Active   atorvastatin (LIPITOR) 20 MG tablet [id number] No TAKE 1 TABLET BY MOUTH EVERY DAY [name], MD Taking Active    Blood Glucose Monitoring Suppl (ONE TOUCH ULTRA 2) w/Device KIT [id number] No Use as directed to check blood sugars 2 times per day dx:e11.22 [name], MD Taking Active   carboxymethylcellulose (REFRESH PLUS) 0.5 % SOLN [id number] No Place 1 drop into both eyes daily. [provider] Taking Active Self  chlorthalidone (HYGROTON) 25 MG tablet [id number] No Take 25 mg by mouth daily.  [provider] Taking Active   cholecalciferol (VITAMIN D3) 25 MCG (1000 UNIT) tablet [id number] No Take 1,000 Units by mouth daily. [provider] Taking Active   famotidine (PEPCID) 20 MG tablet [id number] No Take 20 mg by mouth daily. [provider] Taking Active   gabapentin (NEURONTIN) 100 MG capsule [id number] No TAKE 2 CAPSULES BY MOUTH AT BEDTIME [name], MD Taking Active   hydrALAZINE (APRESOLINE) 25 MG tablet [id number]  TAKE 1 TABLET BY MOUTH TWICE A Lynnell Dike, MD  Active   insulin degludec (TRESIBA FLEXTOUCH) 100 UNIT/ML SOPN FlexTouch Pen [id number] No Inject 16 Units into the skin at bedtime.  [provider] Taking Active   meclizine (ANTIVERT) 25 MG tablet [id number] No TAKE 1 TABLET (25 MG TOTAL) BY MOUTH 3 (THREE) TIMES DAILY AS NEEDED FOR DIZZINESS. [name], MD Taking Active   methimazole (TAPAZOLE) 5 MG tablet [id number] No TAKE 1 TABLET BY MOUTH EVERY DAY [name], MD Taking Active   OneTouch Delica Lancets 00T [place] [id number] No Use as directed to check blood sugars 2 times per day dx: e11.22 [name], MD Taking Active   [facility] ULTRA test strip [id number] No USE AS INSTRUCTED TO CHECK BLOOD SUGARS 2 TIMES PER DAY DX: E11.22 [name], MD Taking Active   propranolol ER (INDERAL LA) 120 MG 24 hr capsule [id number]  TAKE 1 CAPSULE BY MOUTH EVERY DAY [name], MD  Active   Semaglutide, 1 MG/DOSE, 4 MG/3ML SOPN [id number]  Inject 1 mg into the skin once a week. [name], MD  Active   triamcinolone cream (KENALOG)  0.1 % [id number] No Apply 1 application topically 2 (two) times daily. Apply [name], MD Taking Active           Patient Active Problem List   Diagnosis Date Noted  . Class 3 severe obesity due to excess calories with serious comorbidity and body mass index (BMI) of 45.0 to 49.9 in adult ([place]) 05/28/2018  . Hypertensive nephropathy 08/11/2017  . Right knee DJD 11/26/2015  . S/P total knee arthroplasty 04/18/2013  . Unilateral primary osteoarthritis, right knee 04/04/2013    Class: Diagnosis of  . Candidal intertrigo 08/09/2012  . Hypertension 12/15/2011  . Diabetes mellitus with stage 3 chronic kidney disease ([place]) 12/15/2011    Immunization History  Administered Date(s) Administered  . Influenza, High Dose Seasonal PF 11/12/2017, 11/09/2018, 11/22/2019  . PFIZER(Purple Top)SARS-COV-2 Vaccination 04/04/2019, 04/25/2019, 12/07/2019  . Pneumococcal Polysaccharide-23 11/29/2015    Conditions to be addressed/monitored:  Hypertension and Diabetes  Care Plan : Pharmacy Care Plan  Updates made by Mayford Knife, RPH since 05/23/2020 12:00 AM    Problem: HTN, DM II   Priority: High    Long-Range Goal: Disease Management   This Visit's Progress: On track  Note:    Current Barriers:  . Unable to independently afford treatment regimen . Unable to achieve control of Diabetes.     Pharmacist Clinical Goal(s):  [name] Patient will achieve adherence to monitoring guidelines and medication adherence to achieve therapeutic efficacy through collaboration with PharmD and provider.    Interventions: . 1:1 collaboration with [name], MD regarding development and update of comprehensive plan of care as evidenced by provider attestation and co-signature . Inter-disciplinary care team collaboration (see longitudinal plan of care) . Comprehensive medication review performed; medication list updated in electronic medical record  Hypertension (BP goal  <130/80) -Uncontrolled -Current treatment: . Chlorthalidone 25 mg tablet once per day . Amlodipine 10  mg tablet once per day . Propanolol ER 120 mg capsule every day . Hydralazine 25 mg tablet twice per day o Changed to three times per day at last office visit  -Current home readings: around 140/80 -Current dietary habits: patient is avoiding fried and fatty foods -Current exercise habits: patient is going to start walking a little bit more, she was going to the gym but due to the Pandemic she stopped -Spoke with patient about the importance of taking her medication at the same time each day  -Denies hypotensive/hypertensive symptoms -Educated on BP goals and benefits of medications for prevention of heart attack, stroke and kidney damage; Exercise goal of 150 minutes per week; Importance of home blood pressure monitoring; Proper BP monitoring technique; -Counseled to monitor BP at home at least once per day and, document, and provide log at future appointments -Recommended to continue current medication  Diabetes (A1c goal <7%) -Uncontrolled -Current medications: . Tresiba 100 unit/ml - inject 16 units at bedtime  . Ozempic 1 mg into the skin once a week.  -Current home glucose readings . fasting glucose: usually 100-130  -Denies hypoglycemic/hyperglycemic symptoms -Current meal patterns:  . breakfast: boiled egg and toast  . lunch: sometimes does not eat lunch, other times a sandwich  . dinner: vegetables, a little bit of rice and meat  . snacks: trying to stay away from deserts  . Drinks: drinking mostly water, unsweetened tea  -Current exercise: not exercising as  Much, discussed silver sneakers program available on youtube and showed patient how to access system.  -Patient reported there were no pen needles with her [place] shipment, I gave her pen needles that were available at the office,  -Educated on A1c and blood sugar goals; Complications of diabetes including kidney  damage, retinal damage, and cardiovascular disease; Benefits of weight loss; Prevention and management of hypoglycemic episodes; -Counseled to check feet daily and get yearly eye exams -Congratulated patient on A1c improvement -Counseled on diet and exercise extensively Recommended to continue current medication. Reviewed patients current medication regimen, at this time no changes need to be made for renal adjustment.    Patient Goals/Self-Care Activities . Patient will:  - take medications as prescribed target a minimum of 150 minutes of moderate intensity exercise weekly  Follow Up Plan: The patient has been provided with contact information for the care management team and has been advised to call with any health related questions or concerns.       Medication Assistance: [name] obtained through [employer] medication assistance program.  Enrollment ends 01/2021.   Patient's preferred pharmacy is:  [id number] [place] [id number]: [phone]: [phone] Uses pill box? Yes Pt endorses 95% compliance  We discussed: Benefits of medication synchronization, packaging and delivery as well as enhanced pharmacist oversight with [employer]. Patient decided to: Continue current medication management strategy  Care Plan and Follow Up Patient Decision:  Patient agrees to Care Plan and Follow-up.  Plan: Telephone follow up appointment with care management team member scheduled for:  06/28/2020 and The patient has been provided with contact information for the care management team and has been advised to call with any health related questions or concerns.   VOrlando Penner PharmD Clinical Pharmacist [facility] [phone]

## 2020-05-21 DIAGNOSIS — N1832 Chronic kidney disease, stage 3b: Secondary | ICD-10-CM | POA: Diagnosis not present

## 2020-05-23 NOTE — Patient Instructions (Signed)
Visit Information It was great speaking with you today!  Please let me know if you have any questions about our visit.  Goals Addressed            This Visit's Progress   . Manage My Medicine       Timeframe:  Long-Range Goal Priority:  High Start Date:                             Expected End Date:                       Follow Up Date 06/28/2020     - call for medicine refill 2 or 3 days before it runs out - call if I am sick and can't take my medicine - use a pillbox to sort medicine - use an alarm clock or phone to remind me to take my medicine    Why is this important?   . These steps will help you keep on track with your medicines.   Notes:  Try to take your medication at the same time each day       Patient Care Plan: Pharmacy Care Plan    Problem Identified: HTN, DM II   Priority: High    Long-Range Goal: Disease Management   This Visit's Progress: On track  Note:    Current Barriers:  . Unable to independently afford treatment regimen . Unable to achieve control of Diabetes.     Pharmacist Clinical Goal(s):  [name] Patient will achieve adherence to monitoring guidelines and medication adherence to achieve therapeutic efficacy through collaboration with PharmD and provider.    Interventions: . 1:1 collaboration with [name], MD regarding development and update of comprehensive plan of care as evidenced by provider attestation and co-signature . Inter-disciplinary care team collaboration (see longitudinal plan of care) . Comprehensive medication review performed; medication list updated in electronic medical record  Hypertension (BP goal <130/80) -Uncontrolled -Current treatment: . Chlorthalidone 25 mg tablet once per day . Amlodipine 10 mg tablet once per day . Propanolol ER 120 mg capsule every day . Hydralazine 25 mg tablet twice per day o Changed to three times per day at last office visit  -Current home readings: around 140/80 -Current  dietary habits: patient is avoiding fried and fatty foods -Current exercise habits: patient is going to start walking a little bit more, she was going to the gym but due to the Pandemic she stopped -Spoke with patient about the importance of taking her medication at the same time each day  -Denies hypotensive/hypertensive symptoms -Educated on BP goals and benefits of medications for prevention of heart attack, stroke and kidney damage; Exercise goal of 150 minutes per week; Importance of home blood pressure monitoring; Proper BP monitoring technique; -Counseled to monitor BP at home at least once per day and, document, and provide log at future appointments -Recommended to continue current medication  Diabetes (A1c goal <7%) -Uncontrolled -Current medications: . Tresiba 100 unit/ml - inject 16 units at bedtime  . Ozempic 1 mg into the skin once a week.  -Current home glucose readings . fasting glucose: usually 100-130  -Denies hypoglycemic/hyperglycemic symptoms -Current meal patterns:  . breakfast: boiled egg and toast  . lunch: sometimes does not eat lunch, other times a sandwich  . dinner: vegetables, a little bit of rice and meat  . snacks: trying to stay away from deserts  . Drinks:  drinking mostly water, unsweetened tea  -Current exercise: not exercising as  Much, discussed silver sneakers program available on youtube and showed patient how to access system.  -Educated on A1c and blood sugar goals; Complications of diabetes including kidney damage, retinal damage, and cardiovascular disease; Benefits of weight loss; Prevention and management of hypoglycemic episodes; -Counseled to check feet daily and get yearly eye exams -Congratulated patient on A1c improvement -Counseled on diet and exercise extensively Recommended to continue current medication. Reviewed patients current medication regimen, at this time no changes need to be made for renal adjustment.    Patient  Goals/Self-Care Activities . Patient will:  - take medications as prescribed target a minimum of 150 minutes of moderate intensity exercise weekly  Follow Up Plan: The patient has been provided with contact information for the care management team and has been advised to call with any health related questions or concerns.       Patient agreed to services and verbal consent obtained.   The patient verbalized understanding of instructions, educational materials, and care plan provided today and agreed to receive a mailed copy of patient instructions, educational materials, and care plan.   [name], PharmD Clinical Pharmacist [facility] [phone]

## 2020-05-25 ENCOUNTER — Other Ambulatory Visit: Payer: Self-pay | Admitting: Internal Medicine

## 2020-05-29 DIAGNOSIS — R809 Proteinuria, unspecified: Secondary | ICD-10-CM | POA: Diagnosis not present

## 2020-05-29 DIAGNOSIS — N2581 Secondary hyperparathyroidism of renal origin: Secondary | ICD-10-CM | POA: Diagnosis not present

## 2020-05-29 DIAGNOSIS — N1832 Chronic kidney disease, stage 3b: Secondary | ICD-10-CM | POA: Diagnosis not present

## 2020-05-29 DIAGNOSIS — E875 Hyperkalemia: Secondary | ICD-10-CM | POA: Diagnosis not present

## 2020-05-29 DIAGNOSIS — I129 Hypertensive chronic kidney disease with stage 1 through stage 4 chronic kidney disease, or unspecified chronic kidney disease: Secondary | ICD-10-CM | POA: Diagnosis not present

## 2020-05-30 ENCOUNTER — Telehealth: Payer: Self-pay

## 2020-05-30 ENCOUNTER — Telehealth: Payer: Medicare Other

## 2020-05-30 NOTE — Telephone Encounter (Addendum)
  Chronic Care Management   Outreach Note  05/30/2020 Name: Teresa Stanley MRN: [id number] DOB: [birth year]  Referred by: [name], MD Reason for referral : Chronic Care Management (RN CM FU Call Attempt )   An unsuccessful telephone outreach was attempted today. The patient was referred to the case management team for assistance with care management and care coordination.   Follow Up Plan: A HIPAA compliant phone message was left for the patient providing contact information and requesting a return call. Telephone follow up appointment with care management team member scheduled for: 07/11/20  Barb Merino, RN, BSN, CCM Care Management Coordinator [facility] [facility]  Direct Phone: [phone]

## 2020-06-04 ENCOUNTER — Telehealth: Payer: Self-pay

## 2020-06-04 ENCOUNTER — Other Ambulatory Visit: Payer: Self-pay

## 2020-06-04 NOTE — Telephone Encounter (Signed)
The pt was notified to contact her cardiologist and notify him that the olmesartan is causing her to be sick because the pt wants to know if it's ok for her to discontinue the med.

## 2020-06-06 ENCOUNTER — Telehealth: Payer: Self-pay

## 2020-06-06 MED ORDER — SEMAGLUTIDE (1 MG/DOSE) 4 MG/3ML ~~LOC~~ SOPN: 1.0000 mg | PEN_INJECTOR | SUBCUTANEOUS | 2 refills | Status: DC

## 2020-06-06 NOTE — Telephone Encounter (Signed)
The pt was notified that the office is out of ozempic and that a refill will be sent to her pharmacy.  The pt was asked if she is still in the novo nordisk program and the pt said that she hasn't received a call back.

## 2020-06-07 ENCOUNTER — Telehealth: Payer: Self-pay

## 2020-06-07 NOTE — Telephone Encounter (Signed)
Contacted patient to give her a status update on her patient assistance program. Left a HIPAA compliant voicemail for patient to call back.  Total Time 2 minutes [name], PharmD Clinical Pharmacist [facility] [phone]

## 2020-06-08 ENCOUNTER — Telehealth: Payer: Self-pay

## 2020-06-08 NOTE — Chronic Care Management (AMB) (Addendum)
Chronic Care Management Pharmacy Assistant   Name: KRISNA OMAR  MRN: [id number] DOB: [birth year]   Reason for Encounter:  Patient Assistance Coordination  06/08/2020- [place] to inquire on status of patient assistance medications. Used the automatic system, [name] was approved until 02/09/2021 and processed for shipment on 05/14/2020. I did not get any information for Ozempic, so I transferred to a representative. Per Representative they do not have Ozempic only [place] on application. Refaxed application updating Ozempic 1 mg to order. Patient notified.   06/13/2020- Called [facility] to follow up on patient assistance medication Ozempic, per automated system, Ozempic was not mentioned, transferred to a representative, spoke with Tamela Oddi, they received Ozempic order and it was processed on today 5/4, patient will receive medication in 10-14 business days to PCP office, she also informed me that patient's [name] order was sent last month and she has 1 refill left prior to needing to reorder/reapply. Called patient to inform, no answer, left message to return call.   06/20/2020- Called patient to back to inform status on patient assistance medication. While talking to patient, she mentioned that she had not been feeling well, weakness, questionable Covid last week but home Covid test was performed and it was negative. Patient stated she was feeling better from last week but not 100%, she is not eating much to make sure she recovers, eating vegetable broth mainly, slowly increasing food intake. Patient wondered if her symptoms were coming from a new medication she was given by her Kidney specialist- Dr. Candiss Norse a week ago- Olmesartan. Patient wondered if she was taking too many blood pressure medication. She was also taking Amlodipine at bedtime and Hydralazine three times a day. Patient's Hydralazine was decreased to two times a day by Dr Baird Cancer when she contacted the office regarding symptoms  and Dr. Candiss Norse discussed with Dr Baird Cancer and medication reduction of Hydralazine was started. Patient states her blood pressures are all over the place, 154/70, 145/70 135/50, 119/70. Today her blood pressure is at 155/73. Patient is checking her blood sugars also, she is noticing they are increasing since not having the Ozempic, last fasting reading at 156 and ususally before they run around 119 and 120. Patient wonders if she should have another medication to replace Ozempic until it is received into the office. [name], CPP notified and after review chart she suggested patient continue with current medication and continue to monitor, and will discuss with Dr Baird Cancer on any further suggestions. Patient aware and ok with plan. Patient aware PCP office will contact her when medication arrives.   Medications: Outpatient Encounter Medications as of 06/08/2020  Medication Sig   amLODipine (NORVASC) 10 MG tablet TAKE 1 TABLET BY MOUTH EVERY DAY IN THE EVENING   aspirin 81 MG tablet Take 81 mg by mouth daily.   atorvastatin (LIPITOR) 20 MG tablet TAKE 1 TABLET BY MOUTH EVERY DAY   Blood Glucose Monitoring Suppl (ONE TOUCH ULTRA 2) w/Device KIT Use as directed to check blood sugars 2 times per day dx:e11.22   carboxymethylcellulose (REFRESH PLUS) 0.5 % SOLN Place 1 drop into both eyes daily.   chlorthalidone (HYGROTON) 25 MG tablet Take 25 mg by mouth daily.   cholecalciferol (VITAMIN D3) 25 MCG (1000 UNIT) tablet Take 1,000 Units by mouth daily.   famotidine (PEPCID) 20 MG tablet Take 20 mg by mouth daily.   gabapentin (NEURONTIN) 100 MG capsule TAKE 2 CAPSULES BY MOUTH AT BEDTIME   hydrALAZINE (APRESOLINE) 25 MG  tablet TAKE 1 TABLET BY MOUTH TWICE A DAY   insulin degludec (TRESIBA FLEXTOUCH) 100 UNIT/ML SOPN FlexTouch Pen Inject 16 Units into the skin at bedtime.    meclizine (ANTIVERT) 25 MG tablet TAKE 1 TABLET (25 MG TOTAL) BY MOUTH 3 (THREE) TIMES DAILY AS NEEDED FOR DIZZINESS.   methimazole  (TAPAZOLE) 5 MG tablet TAKE 1 TABLET BY MOUTH EVERY DAY   OneTouch Delica Lancets 48D MISC Use as directed to check blood sugars 2 times per day dx: e11.22   ONETOUCH ULTRA test strip USE AS INSTRUCTED TO CHECK BLOOD SUGARS 2 TIMES PER DAY DX: E11.22   propranolol ER (INDERAL LA) 120 MG 24 hr capsule TAKE 1 CAPSULE BY MOUTH EVERY DAY   Semaglutide, 1 MG/DOSE, 4 MG/3ML SOPN Inject 1 mg into the skin once a week.   triamcinolone cream (KENALOG) 0.1 % Apply 1 application topically 2 (two) times daily. Apply   No facility-administered encounter medications on file as of 06/08/2020.   06/26/2020- Patient called by Laverda Sorenson, [facility] scheduler on 06/25/2020 to reschedule 06/28/2020 appointment with [name], CPP. Patient aware and appointment rescheduled to 07/11/2020. Patient requested a refill of one touch lancets and and pen needles for [place] patient assistance medication. Request sent to [place] at PCP for refills of both lancets and needles and reorder form sent to [employer] for pen needles for [place]. Printed for MD to sign and will fax. [name], CPP notified.    Star Rating Drugs: Atorvastatin 20 mg- Last filled 05/21/2020 [facility] day supply at [facility]. Olmesartan 20 mg- Last fileld 05/29/2020 for [facility] day supply at [facility]. Ozempic 1 mg- Last filled on 06/18/2020 for [facility] day supply at [facility]. Metformin 1000 mg- Last filled 11/16/2018 for [facility] day supply at [facility]- PATIENT NO LONGER TAKING  SIG: [name], [place] [phone]  I have reviewed the care management and care coordination activities outlined in this encounter and I am certifying that I agree with the content of this note. No further action required. -Called patient to let her know that her Ozempic is ready and she can pick it up, no answer.  Mayford Knife, [facility] 07/04/20 4:31 PM

## 2020-06-18 ENCOUNTER — Encounter: Payer: Self-pay | Admitting: Internal Medicine

## 2020-06-18 ENCOUNTER — Other Ambulatory Visit: Payer: Self-pay

## 2020-06-18 ENCOUNTER — Ambulatory Visit (INDEPENDENT_AMBULATORY_CARE_PROVIDER_SITE_OTHER): Payer: Medicare Other | Admitting: Internal Medicine

## 2020-06-18 VITALS — BP 138/72 | HR 88 | Temp 97.8°F | Ht 62.0 in | Wt 260.6 lb

## 2020-06-18 DIAGNOSIS — M79672 Pain in left foot: Secondary | ICD-10-CM | POA: Diagnosis not present

## 2020-06-18 DIAGNOSIS — Z6841 Body Mass Index (BMI) 40.0 and over, adult: Secondary | ICD-10-CM

## 2020-06-18 DIAGNOSIS — I129 Hypertensive chronic kidney disease with stage 1 through stage 4 chronic kidney disease, or unspecified chronic kidney disease: Secondary | ICD-10-CM

## 2020-06-18 DIAGNOSIS — E1122 Type 2 diabetes mellitus with diabetic chronic kidney disease: Secondary | ICD-10-CM | POA: Diagnosis not present

## 2020-06-18 DIAGNOSIS — Z794 Long term (current) use of insulin: Secondary | ICD-10-CM

## 2020-06-18 DIAGNOSIS — M79671 Pain in right foot: Secondary | ICD-10-CM | POA: Diagnosis not present

## 2020-06-18 DIAGNOSIS — Z0000 Encounter for general adult medical examination without abnormal findings: Secondary | ICD-10-CM

## 2020-06-18 DIAGNOSIS — N1832 Chronic kidney disease, stage 3b: Secondary | ICD-10-CM

## 2020-06-18 LAB — POCT URINALYSIS DIPSTICK
Bilirubin, UA: NEGATIVE
Blood, UA: NEGATIVE
Glucose, UA: NEGATIVE
Ketones, UA: NEGATIVE
Nitrite, UA: NEGATIVE
Protein, UA: NEGATIVE
Spec Grav, UA: 1.02 (ref 1.010–1.025)
Urobilinogen, UA: 0.2 E.U./dL
pH, UA: 7 (ref 5.0–8.0)

## 2020-06-18 LAB — POCT UA - MICROALBUMIN
Albumin/Creatinine Ratio, Urine, POC: HIGH
Creatinine, POC: 100 mg/dL
Microalbumin Ur, POC: 150 mg/L

## 2020-06-18 MED ORDER — SEMAGLUTIDE (1 MG/DOSE) 4 MG/3ML ~~LOC~~ SOPN: 1.0000 mg | PEN_INJECTOR | SUBCUTANEOUS | 2 refills | Status: AC

## 2020-06-18 NOTE — Patient Instructions (Signed)
Health Maintenance, Female Adopting a healthy lifestyle and getting preventive care are important in promoting health and wellness. Ask your health care provider about:  The right schedule for you to have regular tests and exams.  Things you can do on your own to prevent diseases and keep yourself healthy. What should I know about diet, weight, and exercise? Eat a healthy diet  Eat a diet that includes plenty of vegetables, fruits, low-fat dairy products, and lean protein.  Do not eat a lot of foods that are high in solid fats, added sugars, or sodium.   Maintain a healthy weight Body mass index (BMI) is used to identify weight problems. It estimates body fat based on height and weight. Your health care provider can help determine your BMI and help you achieve or maintain a healthy weight. Get regular exercise Get regular exercise. This is one of the most important things you can do for your health. Most adults should:  Exercise for at least 150 minutes each week. The exercise should increase your heart rate and make you sweat (moderate-intensity exercise).  Do strengthening exercises at least twice a week. This is in addition to the moderate-intensity exercise.  Spend less time sitting. Even light physical activity can be beneficial. Watch cholesterol and blood lipids Have your blood tested for lipids and cholesterol at 74 years of age, then have this test every 5 years. Have your cholesterol levels checked more often if:  Your lipid or cholesterol levels are high.  You are older than 74 years of age.  You are at high risk for heart disease. What should I know about cancer screening? Depending on your health history and family history, you may need to have cancer screening at various ages. This may include screening for:  Breast cancer.  Cervical cancer.  Colorectal cancer.  Skin cancer.  Lung cancer. What should I know about heart disease, diabetes, and high blood  pressure? Blood pressure and heart disease  High blood pressure causes heart disease and increases the risk of stroke. This is more likely to develop in people who have high blood pressure readings, are of African descent, or are overweight.  Have your blood pressure checked: ? Every 3-5 years if you are 18-39 years of age. ? Every year if you are 40 years old or older. Diabetes Have regular diabetes screenings. This checks your fasting blood sugar level. Have the screening done:  Once every three years after age 40 if you are at a normal weight and have a low risk for diabetes.  More often and at a younger age if you are overweight or have a high risk for diabetes. What should I know about preventing infection? Hepatitis B If you have a higher risk for hepatitis B, you should be screened for this virus. Talk with your health care provider to find out if you are at risk for hepatitis B infection. Hepatitis C Testing is recommended for:  Everyone born from 1945 through 1965.  Anyone with known risk factors for hepatitis C. Sexually transmitted infections (STIs)  Get screened for STIs, including gonorrhea and chlamydia, if: ? You are sexually active and are younger than 74 years of age. ? You are older than 74 years of age and your health care provider tells you that you are at risk for this type of infection. ? Your sexual activity has changed since you were last screened, and you are at increased risk for chlamydia or gonorrhea. Ask your health care provider   if you are at risk.  Ask your health care provider about whether you are at high risk for HIV. Your health care provider may recommend a prescription medicine to help prevent HIV infection. If you choose to take medicine to prevent HIV, you should first get tested for HIV. You should then be tested every 3 months for as long as you are taking the medicine. Pregnancy  If you are about to stop having your period (premenopausal) and  you may become pregnant, seek counseling before you get pregnant.  Take 400 to 800 micrograms (mcg) of folic acid every day if you become pregnant.  Ask for birth control (contraception) if you want to prevent pregnancy. Osteoporosis and menopause Osteoporosis is a disease in which the bones lose minerals and strength with aging. This can result in bone fractures. If you are 65 years old or older, or if you are at risk for osteoporosis and fractures, ask your health care provider if you should:  Be screened for bone loss.  Take a calcium or vitamin D supplement to lower your risk of fractures.  Be given hormone replacement therapy (HRT) to treat symptoms of menopause. Follow these instructions at home: Lifestyle  Do not use any products that contain nicotine or tobacco, such as cigarettes, e-cigarettes, and chewing tobacco. If you need help quitting, ask your health care provider.  Do not use street drugs.  Do not share needles.  Ask your health care provider for help if you need support or information about quitting drugs. Alcohol use  Do not drink alcohol if: ? Your health care provider tells you not to drink. ? You are pregnant, may be pregnant, or are planning to become pregnant.  If you drink alcohol: ? Limit how much you use to 0-1 drink a day. ? Limit intake if you are breastfeeding.  Be aware of how much alcohol is in your drink. In the [place] one drink equals one 12 oz bottle of beer (355 mL), one 5 oz glass of wine (148 mL), or one 1 oz glass of hard liquor (44 mL). General instructions  Schedule regular health, dental, and eye exams.  Stay current with your vaccines.  Tell your health care provider if: ? You often feel depressed. ? You have ever been abused or do not feel safe at home. Summary  Adopting a healthy lifestyle and getting preventive care are important in promoting health and wellness.  Follow your health care provider's instructions about healthy  diet, exercising, and getting tested or screened for diseases.  Follow your health care provider's instructions on monitoring your cholesterol and blood pressure. This information is not intended to replace advice given to you by your health care provider. Make sure you discuss any questions you have with your health care provider. Document Revised: 01/20/2018 Document Reviewed: 01/20/2018 Elsevier Patient Education  2021 Elsevier Inc.  

## 2020-06-18 NOTE — Progress Notes (Signed)
Earleen Newport as a Education administrator for Maximino Greenland, MD.,have documented all relevant documentation on the behalf of Maximino Greenland, MD,as directed by  Maximino Greenland, MD while in the presence of Maximino Greenland, MD.  This visit occurred during the SARS-CoV-2 public health emergency.  Safety protocols were in place, including screening questions prior to the visit, additional usage of staff PPE, and extensive cleaning of exam room while observing appropriate contact time as indicated for disinfecting solutions.  Subjective:     Patient ID: Teresa Stanley , female    DOB: [birth year] , 75 y.o.   MRN: [id number]   Chief Complaint  Patient presents with  . Annual Exam  . Diabetes  . Hypertension    HPI  She presents today for HM. She reports compliance with meds.  She was recently started on olmesartan 56m daily. She has not had any issues with meds. She denies headaches , chest pain and shortness of breath.   Diabetes She presents for her follow-up diabetic visit. She has type 2 diabetes mellitus. Her disease course has been stable. There are no hypoglycemic associated symptoms. Pertinent negatives for diabetes include no blurred vision and no chest pain. There are no hypoglycemic complications. Diabetic complications include nephropathy. Risk factors for coronary artery disease include diabetes mellitus, dyslipidemia, obesity, post-menopausal and sedentary lifestyle. She is compliant with treatment some of the time. Her weight is decreasing steadily. She is following a generally healthy diet. She participates in exercise intermittently. Her breakfast blood glucose is taken between 8-9 am. Her breakfast blood glucose range is generally 130-140 mg/dl. An ACE inhibitor/angiotensin II receptor blocker is being taken. Eye exam is current.  Hypertension The current episode started more than 1 year ago. The problem has been gradually improving since onset. The problem is uncontrolled. Pertinent  negatives include no blurred vision, chest pain, palpitations or shortness of breath. The current treatment provides moderate improvement. Compliance problems include exercise.  Hypertensive end-organ damage includes kidney disease.     Past Medical History:  Diagnosis Date  . Arthritis   . Diabetes mellitus    takes Actos and Metformin daily and Victoza  . GERD (gastroesophageal reflux disease)    takes Omeprazole daily  . Headache(784.0)    occasionally  . Hyperlipidemia    takes Atorvastatin daily  . Hypertension    takes Lisinopril,Amlodipine,and Metoprolol daily  . Hypothyroidism   . Joint pain   . Thyroid disease    ?     Family History  Problem Relation Age of Onset  . Diabetes Mother   . Hypertension Mother   . Diabetes Father   . Asthma Father   . Hypertension Father   . Hypertension Daughter   . Hypertension Sister        2  . Diabetes Sister   . Hypertension Brother        5  . Kidney disease Brother   . Colon cancer Neg Hx   . Esophageal cancer Neg Hx   . Rectal cancer Neg Hx   . Stomach cancer Neg Hx      Current Outpatient Medications:  .  amLODipine (NORVASC) 10 MG tablet, TAKE 1 TABLET BY MOUTH EVERY DAY IN THE EVENING, Disp: 90 tablet, Rfl: 2 .  aspirin 81 MG tablet, Take 81 mg by mouth daily., Disp: , Rfl:  .  atorvastatin (LIPITOR) 20 MG tablet, TAKE 1 TABLET BY MOUTH EVERY DAY, Disp: 90 tablet, Rfl: 2 .  Blood Glucose  Monitoring Suppl (ONE TOUCH ULTRA 2) w/Device KIT, Use as directed to check blood sugars 2 times per day dx:e11.22, Disp: 1 each, Rfl: 1 .  carboxymethylcellulose (REFRESH PLUS) 0.5 % SOLN, Place 1 drop into both eyes daily., Disp: , Rfl:  .  chlorthalidone (HYGROTON) 25 MG tablet, Take 25 mg by mouth daily., Disp: , Rfl:  .  cholecalciferol (VITAMIN D3) 25 MCG (1000 UNIT) tablet, Take 1,000 Units by mouth daily., Disp: , Rfl:  .  famotidine (PEPCID) 20 MG tablet, Take 20 mg by mouth daily., Disp: , Rfl:  .  gabapentin (NEURONTIN)  100 MG capsule, TAKE 2 CAPSULES BY MOUTH AT BEDTIME, Disp: 180 capsule, Rfl: 2 .  hydrALAZINE (APRESOLINE) 25 MG tablet, TAKE 1 TABLET BY MOUTH TWICE A DAY, Disp: 60 tablet, Rfl: 1 .  insulin degludec (TRESIBA FLEXTOUCH) 100 UNIT/ML SOPN FlexTouch Pen, Inject 16 Units into the skin at bedtime. , Disp: , Rfl:  .  meclizine (ANTIVERT) 25 MG tablet, TAKE 1 TABLET (25 MG TOTAL) BY MOUTH 3 (THREE) TIMES DAILY AS NEEDED FOR DIZZINESS., Disp: 30 tablet, Rfl: 0 .  methimazole (TAPAZOLE) 5 MG tablet, TAKE 1 TABLET BY MOUTH EVERY DAY, Disp: 90 tablet, Rfl: 1 .  olmesartan (BENICAR) 20 MG tablet, Take 20 mg by mouth daily., Disp: , Rfl:  .  OneTouch Delica Lancets 16B MISC, Use as directed to check blood sugars 2 times per day dx: e11.22, Disp: 300 each, Rfl: 2 .  ONETOUCH ULTRA test strip, USE AS INSTRUCTED TO CHECK BLOOD SUGARS 2 TIMES PER DAY DX: E11.22, Disp: 200 strip, Rfl: 4 .  propranolol ER (INDERAL LA) 120 MG 24 hr capsule, TAKE 1 CAPSULE BY MOUTH EVERY DAY, Disp: 90 capsule, Rfl: 2 .  triamcinolone cream (KENALOG) 0.1 %, Apply 1 application topically 2 (two) times daily. Apply, Disp: 45 g, Rfl: 1 .  Semaglutide, 1 MG/DOSE, 4 MG/3ML SOPN, Inject 1 mg into the skin once a week., Disp: 3 mL, Rfl: 2   No Known Allergies    The patient states she uses none for birth control. Last LMP was No LMP recorded. Patient has had a hysterectomy.. Negative for Dysmenorrhea. Negative for: breast discharge, breast lump(s), breast pain and breast self exam. Associated symptoms include abnormal vaginal bleeding. Pertinent negatives include abnormal bleeding (hematology), anxiety, decreased libido, depression, difficulty falling sleep, dyspareunia, history of infertility, nocturia, sexual dysfunction, sleep disturbances, urinary incontinence, urinary urgency, vaginal discharge and vaginal itching. Diet regular.The patient states her exercise level is  intermittent.  . The patient's tobacco use is:  Social History    Tobacco Use  Smoking Status Never Smoker  Smokeless Tobacco Never Used  . She has been exposed to passive smoke. The patient's alcohol use is:  Social History   Substance and Sexual Activity  Alcohol Use No   Review of Systems  Constitutional: Negative.   HENT: Negative.   Eyes: Negative.  Negative for blurred vision.  Respiratory: Negative.  Negative for shortness of breath.   Cardiovascular: Negative.  Negative for chest pain and palpitations.  Gastrointestinal: Negative.   Endocrine: Negative.   Genitourinary: Negative.   Musculoskeletal: Positive for arthralgias.       She c/o b/l foot pain. Reports sometimes pain is dull/aching. Also with burning in her feet. Unable to identify triggers. Denies LE weakness.   Skin: Negative.   Allergic/Immunologic: Negative.   Neurological: Negative.   Hematological: Negative.   Psychiatric/Behavioral: Negative.      Today's Vitals   06/18/20  1116  BP: 138/72  Pulse: 88  Temp: 97.8 F (36.6 C)  SpO2: 99%  Weight: 260 lb 9.6 oz (118.2 kg)  Height: '5\' 2"'  (1.575 m)  PainSc: 0-No pain   Body mass index is 47.66 kg/m.  Wt Readings from Last 3 Encounters:  06/18/20 260 lb 9.6 oz (118.2 kg)  03/22/20 261 lb 12.8 oz (118.8 kg)  03/22/20 261 lb 12.8 oz (118.8 kg)   Objective:  Physical Exam Vitals and nursing note reviewed.  Constitutional:      Appearance: Normal appearance. She is obese.  HENT:     Head: Normocephalic and atraumatic.     Right Ear: Tympanic membrane, ear canal and external ear normal.     Left Ear: Tympanic membrane, ear canal and external ear normal.     Nose:     Comments: Masked     Mouth/Throat:     Comments: Masked  Eyes:     Extraocular Movements: Extraocular movements intact.     Conjunctiva/sclera: Conjunctivae normal.     Pupils: Pupils are equal, round, and reactive to light.  Cardiovascular:     Rate and Rhythm: Normal rate and regular rhythm.     Pulses:          Dorsalis pedis pulses are  3+ on the right side and 3+ on the left side.     Heart sounds: Normal heart sounds.  Pulmonary:     Effort: Pulmonary effort is normal.     Breath sounds: Normal breath sounds.  Chest:  Breasts:     Tanner Score is 5.     Right: Normal.     Left: Normal.    Abdominal:     General: Bowel sounds are normal.     Palpations: Abdomen is soft.     Comments: Obese, soft  Genitourinary:    Comments: deferred Musculoskeletal:        General: Normal range of motion.     Cervical back: Normal range of motion and neck supple.     Right foot: Bunion present.     Left foot: Bunion present.  Feet:     Right foot:     Protective Sensation: 5 sites tested. 5 sites sensed.     Skin integrity: Callus and dry skin present.     Toenail Condition: Right toenails are normal.     Left foot:     Protective Sensation: 5 sites tested. 5 sites sensed.     Skin integrity: Callus and dry skin present.     Toenail Condition: Left toenails are normal.  Skin:    General: Skin is warm and dry.     Comments: Scattered seborrheic keratoses and DPNs  Neurological:     General: No focal deficit present.     Mental Status: She is alert and oriented to person, place, and time.  Psychiatric:        Mood and Affect: Mood normal.        Behavior: Behavior normal.         Assessment And Plan:     1. Routine general medical examination at health care facility Comments: A full exam was performed. Importance of monthly self breast exams was discussed with the patient.  PATIENT IS ADVISED TO GET 30-45 MINUTES REGULAR EXERCISE NO LESS THAN FOUR TO FIVE DAYS PER WEEK - BOTH WEIGHTBEARING EXERCISES AND AEROBIC ARE RECOMMENDED.  PATIENT IS ADVISED TO FOLLOW A HEALTHY DIET WITH AT LEAST SIX FRUITS/VEGGIES PER DAY, DECREASE INTAKE  OF RED MEAT, AND TO INCREASE FISH INTAKE TO TWO DAYS PER WEEK.  MEATS/FISH SHOULD NOT BE FRIED, BAKED OR BROILED IS PREFERABLE.  IT IS ALSO IMPORTANT TO CUT BACK ON YOUR SUGAR INTAKE. PLEASE  AVOID ANYTHING WITH ADDED SUGAR, CORN SYRUP OR OTHER SWEETENERS. IF YOU MUST USE A SWEETENER, YOU CAN TRY STEVIA. IT IS ALSO IMPORTANT TO AVOID ARTIFICIALLY SWEETENERS AND DIET BEVERAGES. LASTLY, I SUGGEST WEARING SPF 50 SUNSCREEN ON EXPOSED PARTS AND ESPECIALLY WHEN IN THE DIRECT SUNLIGHT FOR AN EXTENDED PERIOD OF TIME.  PLEASE AVOID FAST FOOD RESTAURANTS AND INCREASE YOUR WATER INTAKE.   2. Parenchymal renal hypertension, stage 1 through stage 4 or unspecified chronic kidney disease Comments: Chronic, fair control. Advised to follow low sodium diet. EKG performed, NSR w/ LAFB.  - EKG 12-Lead  3. Type 2 diabetes mellitus with stage 3b chronic kidney disease, with long-term current use of insulin (HCC) Comments: Diabetic foot exam was performed. I DISCUSSED WITH THE PATIENT AT LENGTH REGARDING THE GOALS OF GLYCEMIC CONTROL AND POSSIBLE LONG-TERM COMPLICATIONS.  I  ALSO STRESSED THE IMPORTANCE OF COMPLIANCE WITH HOME GLUCOSE MONITORING, DIETARY RESTRICTIONS INCLUDING AVOIDANCE OF SUGARY DRINKS/PROCESSED FOODS,  ALONG WITH REGULAR EXERCISE.  I  ALSO STRESSED THE IMPORTANCE OF ANNUAL EYE EXAMS, SELF FOOT CARE AND COMPLIANCE WITH OFFICE VISITS.  - CBC - Hemoglobin A1c - POCT UA - Microalbumin - POCT urinalysis dipstick - BMP8+EGFR  4. Bilateral foot pain Comments: Some of her sx are suggestive of arthritis. She is also having some sx suggestive of neuropathy. I will increase gabapentin and refer to Podiatry.  - Ambulatory referral to Podiatry  5. Class 3 severe obesity due to excess calories with serious comorbidity and body mass index (BMI) of 45.0 to 49.9 in adult (HCC) BMI 47. She is encouraged to initially strive for BMI less than 40 to decrease cardiac risk. Advised to aim for at least 150 minutes of exercise per week.  Patient was given opportunity to ask questions. Patient verbalized understanding of the plan and was able to repeat key elements of the plan. All questions were answered to  their satisfaction.   I, Maximino Greenland, MD, have reviewed all documentation for this visit. The documentation on 06/18/20 for the exam, diagnosis, procedures, and orders are all accurate and complete.  THE PATIENT IS ENCOURAGED TO PRACTICE SOCIAL DISTANCING DUE TO THE COVID-19 PANDEMIC.

## 2020-06-19 LAB — BMP8+EGFR
BUN/Creatinine Ratio: 23 (ref 12–28)
BUN: 36 mg/dL — ABNORMAL HIGH (ref 8–27)
CO2: 23 mmol/L (ref 20–29)
Calcium: 10 mg/dL (ref 8.7–10.3)
Chloride: 97 mmol/L (ref 96–106)
Creatinine, Ser: 1.56 mg/dL — ABNORMAL HIGH (ref 0.57–1.00)
Glucose: 191 mg/dL — ABNORMAL HIGH (ref 65–99)
Potassium: 5.2 mmol/L (ref 3.5–5.2)
Sodium: 138 mmol/L (ref 134–144)
eGFR: 35 mL/min/{1.73_m2} — ABNORMAL LOW (ref >59)

## 2020-06-19 LAB — HEMOGLOBIN A1C
Est. average glucose Bld gHb Est-mCnc: 194 mg/dL
Hgb A1c MFr Bld: 8.4 % — ABNORMAL HIGH (ref 4.8–5.6)

## 2020-06-19 LAB — CBC
Hematocrit: 40.5 % (ref 34.0–46.6)
Hemoglobin: 12.9 g/dL (ref 11.1–15.9)
MCH: 28.8 pg (ref 26.6–33.0)
MCHC: 31.9 g/dL (ref 31.5–35.7)
MCV: 90 fL (ref 79–97)
Platelets: 388 10*3/uL (ref 150–450)
RBC: 4.48 x10E6/uL (ref 3.77–5.28)
RDW: 13.4 % (ref 11.7–15.4)
WBC: 8.4 10*3/uL (ref 3.4–10.8)

## 2020-06-21 ENCOUNTER — Other Ambulatory Visit: Payer: Self-pay | Admitting: Internal Medicine

## 2020-06-25 ENCOUNTER — Other Ambulatory Visit: Payer: Self-pay

## 2020-06-25 ENCOUNTER — Ambulatory Visit
Admission: RE | Admit: 2020-06-25 | Discharge: 2020-06-25 | Disposition: A | Discharge status: Home / Self Care | Payer: Medicare Other | Source: Ambulatory Visit | Attending: Internal Medicine | Admitting: Internal Medicine

## 2020-06-25 DIAGNOSIS — Z1231 Encounter for screening mammogram for malignant neoplasm of breast: Secondary | ICD-10-CM | POA: Diagnosis not present

## 2020-06-25 MED ORDER — ONETOUCH DELICA LANCETS 33G MISC: 2 refills | Status: AC

## 2020-06-25 MED ORDER — INSULIN PEN NEEDLE 32G X 4 MM MISC: 10 refills | Status: DC

## 2020-06-28 ENCOUNTER — Telehealth: Payer: Self-pay

## 2020-07-02 ENCOUNTER — Other Ambulatory Visit: Payer: Self-pay | Admitting: Internal Medicine

## 2020-07-02 ENCOUNTER — Telehealth: Payer: Self-pay

## 2020-07-02 NOTE — Telephone Encounter (Signed)
The pt said she wanted to Dr. Baird Cancer know that the the increase in gabapentin has been helping and she wanted to tell Dr. Baird Cancer thank you.

## 2020-07-11 ENCOUNTER — Telehealth: Payer: Self-pay

## 2020-07-11 ENCOUNTER — Telehealth: Payer: Medicare Other

## 2020-07-11 NOTE — Telephone Encounter (Signed)
  Care Management   Follow Up Note   07/11/2020 Name: KORTLYN KOLTZ MRN: [id number] DOB: Jun 29, 1945   Referred by: [name], MD Reason for referral : No chief complaint on file.   A second unsuccessful telephone outreach was attempted today. The patient was referred to the case management team for assistance with care management and care coordination.   Follow Up Plan: A HIPPA compliant phone message was left for the patient providing contact information and requesting a return call.   Barb Merino, RN, BSN, CCM Care Management Coordinator [facility]  Direct Phone: [phone]

## 2020-07-12 ENCOUNTER — Ambulatory Visit (INDEPENDENT_AMBULATORY_CARE_PROVIDER_SITE_OTHER): Payer: Medicare Other

## 2020-07-12 ENCOUNTER — Ambulatory Visit: Payer: Medicare Other | Admitting: Sports Medicine

## 2020-07-12 ENCOUNTER — Encounter: Payer: Self-pay | Admitting: Sports Medicine

## 2020-07-12 ENCOUNTER — Other Ambulatory Visit: Payer: Self-pay

## 2020-07-12 DIAGNOSIS — M79671 Pain in right foot: Secondary | ICD-10-CM | POA: Diagnosis not present

## 2020-07-12 DIAGNOSIS — E1149 Type 2 diabetes mellitus with other diabetic neurological complication: Secondary | ICD-10-CM

## 2020-07-12 DIAGNOSIS — E119 Type 2 diabetes mellitus without complications: Secondary | ICD-10-CM

## 2020-07-12 DIAGNOSIS — M79672 Pain in left foot: Secondary | ICD-10-CM | POA: Diagnosis not present

## 2020-07-12 DIAGNOSIS — E1140 Type 2 diabetes mellitus with diabetic neuropathy, unspecified: Secondary | ICD-10-CM

## 2020-07-12 NOTE — Progress Notes (Signed)
Dg  

## 2020-07-12 NOTE — Patient Instructions (Signed)
Diabetes Mellitus and Foot Care Foot care is an important part of your health, especially when you have diabetes. Diabetes may cause you to have problems because of poor blood flow (circulation) to your feet and legs, which can cause your skin to:  Become thinner and drier.  Break more easily.  Heal more slowly.  Peel and crack. You may also have nerve damage (neuropathy) in your legs and feet, causing decreased feeling in them. This means that you may not notice minor injuries to your feet that could lead to more serious problems. Noticing and addressing any potential problems early is the best way to prevent future foot problems. How to care for your feet Foot hygiene  Wash your feet daily with warm water and mild soap. Do not use hot water. Then, pat your feet and the areas between your toes until they are completely dry. Do not soak your feet as this can dry your skin.  Trim your toenails straight across. Do not dig under them or around the cuticle. File the edges of your nails with an emery board or nail file.  Apply a moisturizing lotion or petroleum jelly to the skin on your feet and to dry, brittle toenails. Use lotion that does not contain alcohol and is unscented. Do not apply lotion between your toes.   Shoes and socks  Wear clean socks or stockings every day. Make sure they are not too tight. Do not wear knee-high stockings since they may decrease blood flow to your legs.  Wear shoes that fit properly and have enough cushioning. Always look in your shoes before you put them on to be sure there are no objects inside.  To break in new shoes, wear them for just a few hours a day. This prevents injuries on your feet. Wounds, scrapes, corns, and calluses  Check your feet daily for blisters, cuts, bruises, sores, and redness. If you cannot see the bottom of your feet, use a mirror or ask someone for help.  Do not cut corns or calluses or try to remove them with medicine.  If you  find a minor scrape, cut, or break in the skin on your feet, keep it and the skin around it clean and dry. You may clean these areas with mild soap and water. Do not clean the area with peroxide, alcohol, or iodine.  If you have a wound, scrape, corn, or callus on your foot, look at it several times a day to make sure it is healing and not infected. Check for: ? Redness, swelling, or pain. ? Fluid or blood. ? Warmth. ? Pus or a bad smell.   General tips  Do not cross your legs. This may decrease blood flow to your feet.  Do not use heating pads or hot water bottles on your feet. They may burn your skin. If you have lost feeling in your feet or legs, you may not know this is happening until it is too late.  Protect your feet from hot and cold by wearing shoes, such as at the beach or on hot pavement.  Schedule a complete foot exam at least once a year (annually) or more often if you have foot problems. Report any cuts, sores, or bruises to your health care provider immediately. Where to find more information  American Diabetes Association: www.diabetes.org  Association of Diabetes Care & Education Specialists: www.diabeteseducator.org Contact a health care provider if:  You have a medical condition that increases your risk of infection and   you have any cuts, sores, or bruises on your feet.  You have an injury that is not healing.  You have redness on your legs or feet.  You feel burning or tingling in your legs or feet.  You have pain or cramps in your legs and feet.  Your legs or feet are numb.  Your feet always feel cold.  You have pain around any toenails. Get help right away if:  You have a wound, scrape, corn, or callus on your foot and: ? You have pain, swelling, or redness that gets worse. ? You have fluid or blood coming from the wound, scrape, corn, or callus. ? Your wound, scrape, corn, or callus feels warm to the touch. ? You have pus or a bad smell coming from  the wound, scrape, corn, or callus. ? You have a fever. ? You have a red line going up your leg. Summary  Check your feet every day for blisters, cuts, bruises, sores, and redness.  Apply a moisturizing lotion or petroleum jelly to the skin on your feet and to dry, brittle toenails.  Wear shoes that fit properly and have enough cushioning.  If you have foot problems, report any cuts, sores, or bruises to your health care provider immediately.  Schedule a complete foot exam at least once a year (annually) or more often if you have foot problems. This information is not intended to replace advice given to you by your health care provider. Make sure you discuss any questions you have with your health care provider. Document Revised: 08/18/2019 Document Reviewed: 08/18/2019 Elsevier Patient Education  2021 Elsevier Inc.  

## 2020-07-12 NOTE — Progress Notes (Signed)
Subjective: Teresa Stanley is a 75 y.o. female patient with history of diabetes who presents to office today for diabetic foot exam.  Patient reports that she was referred by her PCP for an examination and states that she does have some numbness tingling and burning and was diagnosed with neuropathy and was started on gabapentin with the dose adjusted just 2 weeks ago.  Patient reports that with this dose adjustment of gabapentin it seems like it is helping with her pains in her feet.  Patient denies any significant trauma redness warmth swelling or any other acute symptoms in the feet at this time.  Review of systems noncontributory.  Patient Active Problem List   Diagnosis Date Noted  . Class 3 severe obesity due to excess calories with serious comorbidity and body mass index (BMI) of 45.0 to 49.9 in adult ([place]) 05/28/2018  . Hypertensive nephropathy 08/11/2017  . Right knee DJD 11/26/2015  . S/P total knee arthroplasty 04/18/2013  . Unilateral primary osteoarthritis, right knee 04/04/2013    Class: Diagnosis of  . Candidal intertrigo 08/09/2012  . Hypertension 12/15/2011  . Diabetes mellitus with stage 3 chronic kidney disease ([place]) 12/15/2011   Current Outpatient Medications on File Prior to Visit  Medication Sig Dispense Refill  . amLODipine (NORVASC) 10 MG tablet TAKE 1 TABLET BY MOUTH EVERY DAY IN THE EVENING 90 tablet 2  . aspirin 81 MG tablet Take 81 mg by mouth daily.    [name] atorvastatin (LIPITOR) 20 MG tablet TAKE 1 TABLET BY MOUTH EVERY DAY 90 tablet 2  . Blood Glucose Monitoring Suppl (ONE TOUCH ULTRA 2) w/Device KIT Use as directed to check blood sugars 2 times per day dx:e11.22 1 each 1  . carboxymethylcellulose (REFRESH PLUS) 0.5 % SOLN Place 1 drop into both eyes daily.    . chlorthalidone (HYGROTON) 25 MG tablet Take 25 mg by mouth daily.    . cholecalciferol (VITAMIN D3) 25 MCG (1000 UNIT) tablet Take 1,000 Units by mouth daily.    . famotidine (PEPCID) 20 MG tablet Take 20  mg by mouth daily.    [name] gabapentin (NEURONTIN) 100 MG capsule TAKE 2 CAPSULES BY MOUTH AT BEDTIME 180 capsule 2  . hydrALAZINE (APRESOLINE) 25 MG tablet TAKE 1 TABLET BY MOUTH TWICE A DAY 60 tablet 1  . insulin degludec (TRESIBA FLEXTOUCH) 100 UNIT/ML SOPN FlexTouch Pen Inject 16 Units into the skin at bedtime.     . Insulin Pen Needle 32G X 4 MM MISC USE WITH TRESIBA 100 each 10  . LOKELMA 10 g PACK packet TAKE 1 PACKET DAILY X2 DAYS, THEN START TAKING EVERY OTHER DAY    . meclizine (ANTIVERT) 25 MG tablet TAKE 1 TABLET (25 MG TOTAL) BY MOUTH 3 (THREE) TIMES DAILY AS NEEDED FOR DIZZINESS. 30 tablet 0  . methimazole (TAPAZOLE) 5 MG tablet TAKE 1 TABLET BY MOUTH EVERY DAY 90 tablet 1  . olmesartan (BENICAR) 20 MG tablet Take 20 mg by mouth daily.    Glory Rosebush Delica Lancets 54M MISC Use as directed to check blood sugars 2 times per day dx: e11.22 300 each 2  . ONETOUCH ULTRA test strip USE AS INSTRUCTED TO CHECK BLOOD SUGARS 2 TIMES PER DAY DX: E11.22 200 strip 4  . propranolol ER (INDERAL LA) 120 MG 24 hr capsule TAKE 1 CAPSULE BY MOUTH EVERY DAY 90 capsule 2  . Semaglutide, 1 MG/DOSE, 4 MG/3ML SOPN Inject 1 mg into the skin once a week. 3 mL 2  .  sodium bicarbonate 650 MG tablet Take 650 mg by mouth daily.    [name] triamcinolone cream (KENALOG) 0.1 % Apply 1 application topically 2 (two) times daily. Apply 45 g 1   No current facility-administered medications on file prior to visit.   No Known Allergies  Recent Results (from the past 2160 hour(s))  HM DIABETES EYE EXAM     Status: None   Collection Time: 04/30/20 12:00 AM  Result Value Ref Range   HM Diabetic Eye Exam No Retinopathy No Retinopathy  CBC     Status: None   Collection Time: 06/18/20 12:10 PM  Result Value Ref Range   WBC 8.4 3.4 - 10.8 x10E3/uL   RBC 4.48 3.77 - 5.28 x10E6/uL   Hemoglobin 12.9 11.1 - 15.9 g/dL   Hematocrit 40.5 34.0 - 46.6 %   MCV 90 79 - 97 fL   MCH 28.8 26.6 - 33.0 pg   MCHC 31.9 31.5 - 35.7 g/dL    RDW 13.4 11.7 - 15.4 %   Platelets 388 150 - 450 x10E3/uL  Hemoglobin A1c     Status: Abnormal   Collection Time: 06/18/20 12:10 PM  Result Value Ref Range   Hgb A1c MFr Bld 8.4 (H) 4.8 - 5.6 %    Comment:          Prediabetes: 5.7 - 6.4          Diabetes: >6.4          Glycemic control for adults with diabetes: <7.0    Est. average glucose Bld gHb Est-mCnc 194 mg/dL  BMP8+EGFR     Status: Abnormal   Collection Time: 06/18/20 12:10 PM  Result Value Ref Range   Glucose 191 (H) 65 - 99 mg/dL   BUN 36 (H) 8 - 27 mg/dL   Creatinine, Ser 1.56 (H) 0.57 - 1.00 mg/dL   eGFR 35 (L) >59 mL/min/1.73   BUN/Creatinine Ratio 23 12 - 28   Sodium 138 134 - 144 mmol/L   Potassium 5.2 3.5 - 5.2 mmol/L   Chloride 97 96 - 106 mmol/L   CO2 23 20 - 29 mmol/L   Calcium 10.0 8.7 - 10.3 mg/dL  POCT urinalysis dipstick     Status: Abnormal   Collection Time: 06/18/20 12:25 PM  Result Value Ref Range   Color, UA     Clarity, UA     Glucose, UA Negative Negative   Bilirubin, UA Negative    Ketones, UA Negative    Spec Grav, UA 1.020 1.010 - 1.025   Blood, UA Negative    pH, UA 7.0 5.0 - 8.0   Protein, UA Negative Negative   Urobilinogen, UA 0.2 0.2 or 1.0 E.U./dL   Nitrite, UA negative    Leukocytes, UA Small (1+) (A) Negative   Appearance     Odor    POCT UA - Microalbumin     Status: None   Collection Time: 06/18/20 12:27 PM  Result Value Ref Range   Microalbumin Ur, POC 150 mg/L   Creatinine, POC 100 mg/dL   Albumin/Creatinine Ratio, Urine, POC High Abnormal     Objective: General: Patient is awake, alert, and oriented x 3 and in no acute distress.  Integument: Skin is warm, dry and supple bilateral. Nails are short thickened and  dystrophic with subungual debris mildly at the distal and however patient does have her nails polish but due to the nail changes this is likely consistent with onychomycosis, 1-5 bilateral. No signs of infection. No  open lesions or preulcerative lesions present  bilateral. Remaining integument unremarkable.  Vasculature:  Dorsalis Pedis pulse 1/4 bilateral. Posterior Tibial pulse 1/4 bilateral.  Capillary fill time <3 sec 1-5 bilateral.  Scant hair growth to the level of the digits. Temperature gradient within normal limits. No varicosities present bilateral.  Trace edema present bilateral ankles.   Neurology: The patient has intact sensation measured with a 5.07/10g Semmes Weinstein Monofilament at all pedal sites bilateral. Vibratory sensation diminished bilateral with tuning fork. No Babinski sign present bilateral.  Subjective burning and tingling that is better since starting on 300 mg of gabapentin at bedtime.  Musculoskeletal: Asymptomatic bunion, hammertoe, pes planus pedal deformities noted bilateral. Muscular strength 5/5 in all lower extremity muscular groups bilateral without pain on range of motion . No tenderness with calf compression bilateral.  Assessment and Plan: Problem List Items Addressed This Visit   None   Visit Diagnoses    Bilateral foot pain    -  Primary   Relevant Orders   DG Foot Complete Left   DG Foot Complete Right   Comprehensive diabetic foot examination, type 2 DM, encounter for [facility])       Type 2 diabetes mellitus with diabetic neuropathy, unspecified whether long term insulin use ([place])          -Examined patient. -X-rays obtained and reviewed consistent with structural changes related to bunion and hammertoe and pes planus deformity patient also does have posterior and inferior heel spur and dorsal midfoot spur consistent with arthritis and midtarsal breech secondary to pes planus -Discussed and educated patient on diabetic foot care, especially with  regards to the vascular, neurological and musculoskeletal systems.  -Stressed the importance of good glycemic control and the detriment of not  controlling glucose levels in relation to the foot. -Patient declined offer for diabetic shoes at this time -Patient  declined offer for routine nail trimming and would like to continue with her current pedicurist -Encouraged elevation when sitting to assist with edema control -Recommend continue with neuropathy management with use of gabapentin as directed by PCP -Answered all patient questions -Patient to return once a year for diabetic foot exam.  Patient is a low risk diabetic has no open wounds no circulatory problems no risks acutely for amputation. -Patient advised to call the office if any problems or questions arise in the meantime.  [name], DPM

## 2020-07-13 ENCOUNTER — Other Ambulatory Visit: Payer: Self-pay | Admitting: Sports Medicine

## 2020-07-13 DIAGNOSIS — E1149 Type 2 diabetes mellitus with other diabetic neurological complication: Secondary | ICD-10-CM

## 2020-07-14 ENCOUNTER — Other Ambulatory Visit: Payer: Self-pay | Admitting: Internal Medicine

## 2020-08-07 ENCOUNTER — Telehealth: Payer: Self-pay

## 2020-08-07 NOTE — Chronic Care Management (AMB) (Signed)
   Patient aware of telephone appointment with [name] CPP on 08-08-2020 at 9:00. Patient aware to have/bring all medications, supplements, blood pressure and/or blood sugar logs to visit.  Questions: Have you had any recent office visit or specialist visit outside of [place]? Patient stated No.  Are there any concerns you would like to discuss during your office visit? Patient stated no  Are you having any problems obtaining your medications? Patient stated no  If patient has any PAP medications ask if they are having any problems getting their PAP medication or refill? Patient stated no  Star Rating Drug: Atorvastatin 20 mg- Last Filled 05-21-2020 [facility] Olmesartan 20 mg- Last filled 05-29-2020 [facility] Ozempic 1 mg- Patient assistance  Any gaps in medications fill history? No  [place] Pharmacist Assistant [phone]

## 2020-08-08 ENCOUNTER — Ambulatory Visit (INDEPENDENT_AMBULATORY_CARE_PROVIDER_SITE_OTHER): Payer: Medicare Other

## 2020-08-08 DIAGNOSIS — N1832 Chronic kidney disease, stage 3b: Secondary | ICD-10-CM | POA: Diagnosis not present

## 2020-08-08 DIAGNOSIS — E1122 Type 2 diabetes mellitus with diabetic chronic kidney disease: Secondary | ICD-10-CM | POA: Diagnosis not present

## 2020-08-08 DIAGNOSIS — I129 Hypertensive chronic kidney disease with stage 1 through stage 4 chronic kidney disease, or unspecified chronic kidney disease: Secondary | ICD-10-CM

## 2020-08-08 DIAGNOSIS — Z794 Long term (current) use of insulin: Secondary | ICD-10-CM | POA: Diagnosis not present

## 2020-08-08 NOTE — Patient Instructions (Signed)
Visit Information It was great speaking with you today!  Please let me know if you have any questions about our visit.   Goals Addressed             This Visit's Progress    Manage My Medicine       Timeframe:  Long-Range Goal Priority:  High Start Date:                             Expected End Date:                       Follow Up Date 10/02/2020   - call for medicine refill 2 or 3 days before it runs out - call if I am sick and can't take my medicine - use a pillbox to sort medicine - use an alarm clock or phone to remind me to take my medicine    Why is this important?   These steps will help you keep on track with your medicines.   Notes:  Try to take your medication at the same time each day Keep up the good work         Patient Care Plan: Pharmacy Care Plan     Problem Identified: HTN, HLD,  DM II   Priority: High     Long-Range Goal: Disease Management   Recent Progress: On track  Note:    Current Barriers:  Unable to independently afford treatment regimen Unable to achieve control of Diabetes.     Pharmacist Clinical Goal(s):  Patient will achieve adherence to monitoring guidelines and medication adherence to achieve therapeutic efficacy through collaboration with PharmD and provider.    Current Barriers:  Unable to independently afford treatment regimen Unable to independently monitor therapeutic efficacy  Pharmacist Clinical Goal(s):  Patient will verbalize ability to afford treatment regimen achieve control of Diabetes  as evidenced by A1c<7 through collaboration with PharmD and provider.   Interventions: 1:1 collaboration with [name], MD regarding development and update of comprehensive plan of care as evidenced by provider attestation and co-signature Inter-disciplinary care team collaboration (see longitudinal plan of care) Comprehensive medication review performed; medication list updated in electronic medical  record  Hypertension (BP goal <130/80) -Controlled -Current treatment: Olmesartan 20 mg tablet once per day Propranolol ER 120 mg once per day  Chlorthalidone 25 mg tablet once per day  Hydralazine 25 mg taking 1 tablet by mouth twice per day  Amlodipine 10 mg tablet once per day  -Current home readings: 133/73,  -Current dietary habits: Mrs. Dash  -Current exercise habits: please see diabetes  -Denies hypotensive/hypertensive symptoms -Educated on BP goals and benefits of medications for prevention of heart attack, stroke and kidney damage; Exercise goal of 150 minutes per week; Proper BP monitoring technique; -Counseled to monitor BP at home at least four times per week, document, and provide log at future appointments -Recommended to continue current medication  Hyperlipidemia: (LDL goal < 70) -Uncontrolled -Current treatment: Atorvastatin 20 mg tablet daily Aspirin 81 mg tablet once per day -Current dietary patterns: she is is avoiding fried and fatty foods  -Current exercise:does exercise in the morning doing push ups, and different exercises from physical therapy and chair exercises, she is doing 15-20 minutes per day  -Educated on Cholesterol goals;  Importance of limiting foods high in cholesterol; Exercise goal of 150 minutes per week; -Recommended to continue current medication  Diabetes (A1c goal <  7%) -Controlled -Current medications: Semaglutide 1 mg - once a week on Wednesday  Tresiba 100 unit/ml - inject 16 units every night  -Current home glucose readings fasting glucose: 105, 114,125  -Denies hypoglycemic/hyperglycemic symptoms -Current meal patterns:  breakfast: eating a boiled egg with apple sauce, or cereal- corn flakes  lunch: [place] sandwich   dinner: will discuss during the next office visit  snacks: apples  drinks: drinking 3- 16 ounce bottles of water per day  -Current exercise:does exercise in the morning doing push ups, and different exercises  from physical therapy and chair exercises, she is doing 15-20 minutes per day  -Educated on Exercise goal of 150 minutes per week; Prevention and management of hypoglycemic episodes; -Counseled to check feet daily and get yearly eye exams -Recommended to continue current medication  Health Maintenance -Vaccine gaps:       -Shingles Vaccine  -Counseled on the importance of being vaccinated.   Patient Goals/Self-Care Activities Patient will:  - take medications as prescribed  Follow Up Plan: The patient has been provided with contact information for the care management team and has been advised to call with any health related questions or concerns.       Patient agreed to services and verbal consent obtained.   The patient verbalized understanding of instructions, educational materials, and care plan provided today and agreed to receive a mailed copy of patient instructions, educational materials, and care plan.   [name], PharmD Clinical Pharmacist [facility] [phone]

## 2020-08-08 NOTE — Progress Notes (Addendum)
Chronic Care Management Pharmacy Note  08/08/2020 Name:  Teresa Stanley MRN:  [id number] DOB:  1945/07/08  Summary: Patient reports that she has been doing well. She has received her COVID-19 booster vaccine.   Recommendations/Changes made from today's visit: Recommend patient receive shingles vaccines   Plan: Patients daughter is going to sign her up for the shingles vaccine.    Subjective: Teresa Stanley is an 75 y.o. year old female who is a primary patient of [name], MD.  The CCM team was consulted for assistance with disease management and care coordination needs.    Engaged with patient by telephone for follow up visit in response to provider referral for pharmacy case management and/or care coordination services.   Consent to Services:  The patient was given information about Chronic Care Management services, agreed to services, and gave verbal consent prior to initiation of services.  Please see initial visit note for detailed documentation.   Patient Care Team: [name], MD as PCP - General (Internal Medicine) Lynne Logan, RN as Case Manager Cyril Mourning, [facility] (Inactive) (Pharmacist)  Recent office visits: 06/18/2020 PCP OV   Recent consult visits: 07/12/2020 [facility] visits: None in previous 6 months   Objective:  Lab Results  Component Value Date   CREATININE 1.56 (H) 06/18/2020   BUN 36 (H) 06/18/2020   GFR 39.42 (L) 12/25/2017   GFRNONAA 38 (L) 03/22/2020   GFRAA 43 (L) 03/22/2020   NA 138 06/18/2020   K 5.2 06/18/2020   CALCIUM 10.0 06/18/2020   CO2 23 06/18/2020   GLUCOSE 191 (H) 06/18/2020    Lab Results  Component Value Date/Time   HGBA1C 8.4 (H) 06/18/2020 12:10 PM   HGBA1C 7.3 (H) 03/22/2020 11:12 AM   GFR 39.42 (L) 12/25/2017 03:08 PM   MICROALBUR 150 06/18/2020 12:27 PM   MICROALBUR 80 06/16/2019 02:39 PM    Last diabetic Eye exam:  Lab Results  Component Value Date/Time   HMDIABEYEEXA No  Retinopathy 04/30/2020 12:00 AM    Last diabetic Foot exam: No results found for: HMDIABFOOTEX   Lab Results  Component Value Date   CHOL 175 03/22/2020   HDL 65 03/22/2020   LDLCALC 94 03/22/2020   TRIG 86 03/22/2020   CHOLHDL 2.7 03/22/2020    Hepatic Function Latest Ref Rng & Units 03/22/2020 06/16/2019 03/17/2019  Total Protein 6.0 - 8.5 g/dL 7.4 7.4 7.1  Albumin 3.7 - 4.7 g/dL 4.3 - 4.3  AST 0 - 40 IU/L 13 - 15  ALT 0 - 32 IU/L 12 - 10  Alk Phosphatase 44 - 121 IU/L 157(H) - 175(H)  Total Bilirubin 0.0 - 1.2 mg/dL 0.4 - 0.4    Lab Results  Component Value Date/Time   TSH 2.930 03/22/2020 11:12 AM   TSH 0.005 (L) 08/23/2009 07:54 PM   FREET4 1.73 03/22/2020 11:12 AM   FREET4 1.33 08/23/2009 07:54 PM    CBC Latest Ref Rng & Units 06/18/2020 03/22/2020 06/16/2019  WBC 3.4 - 10.8 x10E3/uL 8.4 7.5 7.9  Hemoglobin 11.1 - 15.9 g/dL 12.9 12.9 13.2  Hematocrit 34.0 - 46.6 % 40.5 41.2 40.9  Platelets 150 - 450 x10E3/uL 388 305 271    No results found for: VD25OH  Clinical ASCVD: Yes  The 10-year ASCVD risk score Mikey Bussing DC Jr., et al., 2013) is: 31.4%   Values used to calculate the score:     Age: 59 years     Sex: Female  Is Non-Hispanic African American: Yes     Diabetic: Yes     Tobacco smoker: No     Systolic Blood Pressure: 326 mmHg     Is BP treated: Yes     HDL Cholesterol: 65 mg/dL     Total Cholesterol: 175 mg/dL    Depression screen [facility] 2/9 03/22/2020 03/17/2019 05/27/2018  Decreased Interest 0 0 0  Down, Depressed, Hopeless 0 0 0  PHQ - 2 Score 0 0 0  Altered sleeping - 0 0  Tired, decreased energy - 0 0  Change in appetite - 0 0  Feeling bad or failure about yourself  - 0 0  Trouble concentrating - 0 0  Moving slowly or fidgety/restless - 0 0  Suicidal thoughts - 0 0  PHQ-9 Score - 0 0  Difficult doing work/chores - Not difficult at all -  Some recent data might be hidden     Social History   Tobacco Use  Smoking Status Never  Smokeless Tobacco Never    BP Readings from Last 3 Encounters:  06/18/20 138/72  03/22/20 (!) 160/110  03/22/20 (!) 160/110   Pulse Readings from Last 3 Encounters:  06/18/20 88  03/22/20 92  03/22/20 92   Wt Readings from Last 3 Encounters:  06/18/20 260 lb 9.6 oz (118.2 kg)  03/22/20 261 lb 12.8 oz (118.8 kg)  03/22/20 261 lb 12.8 oz (118.8 kg)   BMI Readings from Last 3 Encounters:  06/18/20 47.66 kg/m  03/22/20 47.88 kg/m  03/22/20 47.88 kg/m    Assessment/Interventions: Review of patient past medical history, allergies, medications, health status, including review of consultants reports, laboratory and other test data, was performed as part of comprehensive evaluation and provision of chronic care management services.   SDOH:  (Social Determinants of Health) assessments and interventions performed: No  SDOH Screenings   Alcohol Screen: Not on file  Depression (PHQ2-9): Low Risk    PHQ-2 Score: 0  Financial Resource Strain: Low Risk    Difficulty of Paying Living Expenses: Not hard at all  Food Insecurity: No Food Insecurity   Worried About Charity fundraiser in the Last Year: Never true   Ran Out of Food in the Last Year: Never true  Housing: Not on file  Physical Activity: Sufficiently Active   Days of Exercise per Week: 5 days   Minutes of Exercise per Session: 30 min  Social Connections: Not on file  Stress: No Stress Concern Present   Feeling of Stress : Not at all  Tobacco Use: Low Risk    Smoking Tobacco Use: Never   Smokeless Tobacco Use: Never  Transportation Needs: No Transportation Needs   Lack of Transportation (Medical): No   Lack of Transportation (Non-Medical): No    CCM Care Plan  No Known Allergies  Medications Reviewed Today     Reviewed by Mayford Knife, [facility] (Pharmacist) on 08/08/20 at [phone]  Med List Status: <None>   Medication Order Taking? Sig Documenting Provider Last Dose Status Informant  amLODipine (NORVASC) 10 MG tablet [id number] Yes TAKE 1  TABLET BY MOUTH EVERY DAY IN THE Fredric Mare, MD Taking Active   aspirin 81 MG tablet [id number] Yes Take 81 mg by mouth daily. [provider] Taking Active   atorvastatin (LIPITOR) 20 MG tablet [id number] Yes TAKE 1 TABLET BY MOUTH EVERY DAY [name], MD Taking Active   Blood Glucose Monitoring Suppl (ONE TOUCH ULTRA 2) w/Device KIT [id number] Yes Use as directed to  check blood sugars 2 times per day dx:e11.22 [name], MD Taking Active   carboxymethylcellulose (REFRESH PLUS) 0.5 % SOLN [id number] Yes Place 1 drop into both eyes daily. [provider] Taking Active Self  chlorthalidone (HYGROTON) 25 MG tablet [id number] Yes Take 25 mg by mouth daily. [provider] Taking Active   cholecalciferol (VITAMIN D3) 25 MCG (1000 UNIT) tablet [id number] Yes Take 1,000 Units by mouth daily. [provider] Taking Active   famotidine (PEPCID) 20 MG tablet [id number] Yes Take 20 mg by mouth daily. [provider] Taking Active   gabapentin (NEURONTIN) 100 MG capsule [id number] Yes TAKE 2 CAPSULES BY MOUTH AT BEDTIME [name], MD Taking Active   hydrALAZINE (APRESOLINE) 25 MG tablet [id number] Yes TAKE 1 TABLET BY MOUTH TWICE A Lynnell Dike, MD Taking Active   insulin degludec (TRESIBA FLEXTOUCH) 100 UNIT/ML SOPN FlexTouch Pen [id number] Yes Inject 16 Units into the skin at bedtime.  [provider] Taking Active   Insulin Pen Needle 32G X 4 MM MISC [id number] Yes USE WITH Thornton Papas, MD Taking Active   LOKELMA 10 g PACK packet [id number] Yes TAKE 1 PACKET DAILY X2 DAYS, THEN START TAKING EVERY OTHER DAY [provider] Taking Active   meclizine (ANTIVERT) 25 MG tablet [id number] Yes TAKE 1 TABLET (25 MG TOTAL) BY MOUTH 3 (THREE) TIMES DAILY AS NEEDED FOR DIZZINESS. [name], MD Taking Active   methimazole (TAPAZOLE) 5 MG tablet [id number] Yes TAKE 1 TABLET BY MOUTH EVERY DAY [name], MD Taking Active    olmesartan (BENICAR) 20 MG tablet [id number] Yes Take 20 mg by mouth daily. [provider] Taking Active   OneTouch Delica Lancets 44Y MISC [id number] Yes Use as directed to check blood sugars 2 times per day dx: e11.22 [name], MD Taking Active   [facility] ULTRA test strip [id number] Yes USE AS INSTRUCTED TO CHECK BLOOD SUGARS 2 TIMES PER DAY DX: E11.22 [name], MD Taking Active   propranolol ER (INDERAL LA) 120 MG 24 hr capsule [id number] Yes TAKE 1 CAPSULE BY MOUTH EVERY DAY [name], MD Taking Active   Semaglutide, 1 MG/DOSE, 4 MG/3ML SOPN [id number] Yes Inject 1 mg into the skin once a week. [name], MD Taking Active   sodium bicarbonate 650 MG tablet [id number] Yes Take 650 mg by mouth daily. [provider] Taking Active   triamcinolone cream (KENALOG) 0.1 % [id number] Yes Apply 1 application topically 2 (two) times daily. 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   S/P total knee arthroplasty 04/18/2013   Unilateral primary osteoarthritis, right knee 04/04/2013    Class: Diagnosis of   Candidal intertrigo 08/09/2012   Hypertension 12/15/2011   Diabetes mellitus with stage 3 chronic kidney disease ([place]) 12/15/2011    Immunization History  Administered Date(s) Administered   Influenza, High Dose Seasonal PF 11/12/2017, 11/09/2018, 11/22/2019   PFIZER Comirnaty(Gray Top)Covid-19 Tri-Sucrose Vaccine 08/01/2020   PFIZER(Purple Top)SARS-COV-2 Vaccination 04/04/2019, 04/25/2019, 12/07/2019   Pneumococcal Polysaccharide-23 11/29/2015    Conditions to be addressed/monitored:  Hypertension, Hyperlipidemia, and Diabetes  Care Plan : Pharmacy Care Plan  Updates made by Mayford Knife, [place] since  08/08/2020 12:00 AM     Problem: HTN, HLD,  DM II   Priority: High     Long-Range Goal: Disease Management   Recent Progress: On track  Note:    Current Barriers:  Unable to independently afford treatment regimen Unable to achieve control of Diabetes.     Pharmacist Clinical Goal(s):  Patient will achieve adherence to monitoring guidelines and medication adherence to achieve therapeutic efficacy through collaboration with PharmD and provider.    Current Barriers:  Unable to independently afford treatment regimen Unable to independently monitor therapeutic efficacy  Pharmacist Clinical Goal(s):  Patient will verbalize ability to afford treatment regimen achieve control of Diabetes  as evidenced by A1c<7 through collaboration with PharmD and provider.   Interventions: 1:1 collaboration with [name], MD regarding development and update of comprehensive plan of care as evidenced by provider attestation and co-signature Inter-disciplinary care team collaboration (see longitudinal plan of care) Comprehensive medication review performed; medication list updated in electronic medical record  Hypertension (BP goal <130/80) -Controlled -Current treatment: Olmesartan 20 mg tablet once per day Propranolol ER 120 mg once per day  Chlorthalidone 25 mg tablet once per day  Hydralazine 25 mg taking 1 tablet by mouth twice per day  Amlodipine 10 mg tablet once per day  -Current home readings: 133/73,  -Current dietary habits: Mrs. Dash  -Current exercise habits: please see diabetes  -Denies hypotensive/hypertensive symptoms -Educated on BP goals and benefits of medications for prevention of heart attack, stroke and kidney damage; Exercise goal of 150 minutes per week; Proper BP monitoring technique; -Counseled to monitor BP at home at least four times per week, document, and provide log at future appointments -Recommended to continue current  medication  Hyperlipidemia: (LDL goal < 70) -Uncontrolled -Current treatment: Atorvastatin 20 mg tablet daily Aspirin 81 mg tablet once per day -Current dietary patterns: she is is avoiding fried and fatty foods  -Current exercise:does exercise in the morning doing push ups, and different exercises from physical therapy and chair exercises, she is doing 15-20 minutes per day  -Educated on Cholesterol goals;  Importance of limiting foods high in cholesterol; Exercise goal of 150 minutes per week; -Recommended to continue current medication  Diabetes (A1c goal <7%) -Controlled -Current medications: Semaglutide 1 mg - once a week on Wednesday  Tresiba 100 unit/ml - inject 16 units every night  -Current home glucose readings fasting glucose: 105, 114,125  -Denies hypoglycemic/hyperglycemic symptoms -Current meal patterns:  breakfast: eating a boiled egg with apple sauce, or cereal- corn flakes  lunch: [place] sandwich   dinner: will discuss during the next office visit  snacks: apples  drinks: drinking 3- 16 ounce bottles of water per day  -Current exercise:does exercise in the morning doing push ups, and different exercises from physical therapy and chair exercises, she is doing 15-20 minutes per day  -Educated on Exercise goal of 150 minutes per week; Prevention and management of hypoglycemic episodes; -Counseled to check feet daily and get yearly eye exams -Recommended to continue current medication  Health Maintenance -Vaccine gaps:       -Shingles Vaccine  -Counseled on the importance of being vaccinated.   Patient Goals/Self-Care Activities Patient will:  - take medications as prescribed  Follow Up Plan: The patient has been provided with contact information for the care management team and has been advised to call with any health related questions or concerns.       Medication Assistance:  Joni Reining obtained through [employer] medication assistance  program.   Enrollment ends 01/2021  Compliance/Adherence/Medication fill history: Care Gaps: Shingles Vaccine  Pneumonia Vaccine  Star-Rating Drugs: Ozempic 1 mg Atorvastatin 20 mg  Olmesartan 20 mg  Patient's [facility]  [id number]: [phone]: [phone] Uses pill box? Yes Pt endorses 95% compliance  We discussed: Benefits of medication synchronization, packaging and delivery as well as enhanced pharmacist oversight with [employer]. Patient decided to: Continue current medication management strategy  Care Plan and Follow Up Patient Decision:  Patient agrees to Care Plan and Follow-up.  Plan: Telephone follow up appointment with care management team member scheduled for:  10/02/2020 and The patient has been provided with contact information for the care management team and has been advised to call with any health related questions or concerns.   VOrlando Penner PharmD Clinical Pharmacist [facility] [phone]

## 2020-08-11 ENCOUNTER — Other Ambulatory Visit: Payer: Self-pay | Admitting: Internal Medicine

## 2020-08-20 DIAGNOSIS — N1832 Chronic kidney disease, stage 3b: Secondary | ICD-10-CM | POA: Diagnosis not present

## 2020-08-21 ENCOUNTER — Telehealth: Payer: Self-pay

## 2020-08-21 NOTE — Telephone Encounter (Signed)
Called patient to let her know that her Teresa Stanley was at [facility] and ready for pick up.  Patient voiced understanding.   [name], PharmD Clinical Pharmacist [facility] [phone]

## 2020-08-22 ENCOUNTER — Other Ambulatory Visit: Payer: Self-pay | Admitting: Internal Medicine

## 2020-08-22 ENCOUNTER — Telehealth: Payer: Medicare Other

## 2020-08-28 DIAGNOSIS — E872 Acidosis: Secondary | ICD-10-CM | POA: Diagnosis not present

## 2020-08-28 DIAGNOSIS — N2581 Secondary hyperparathyroidism of renal origin: Secondary | ICD-10-CM | POA: Diagnosis not present

## 2020-08-28 DIAGNOSIS — I129 Hypertensive chronic kidney disease with stage 1 through stage 4 chronic kidney disease, or unspecified chronic kidney disease: Secondary | ICD-10-CM | POA: Diagnosis not present

## 2020-08-28 DIAGNOSIS — N1832 Chronic kidney disease, stage 3b: Secondary | ICD-10-CM | POA: Diagnosis not present

## 2020-08-28 DIAGNOSIS — D631 Anemia in chronic kidney disease: Secondary | ICD-10-CM | POA: Diagnosis not present

## 2020-08-28 DIAGNOSIS — R809 Proteinuria, unspecified: Secondary | ICD-10-CM | POA: Diagnosis not present

## 2020-09-03 DIAGNOSIS — N390 Urinary tract infection, site not specified: Secondary | ICD-10-CM | POA: Diagnosis not present

## 2020-09-03 DIAGNOSIS — N1832 Chronic kidney disease, stage 3b: Secondary | ICD-10-CM | POA: Diagnosis not present

## 2020-09-07 ENCOUNTER — Other Ambulatory Visit: Payer: Self-pay | Admitting: Internal Medicine

## 2020-09-07 NOTE — Chronic Care Management (AMB) (Signed)
    Chronic Care Management Pharmacy Assistant   Name: Teresa Stanley  MRN: [id number] DOB: [birth year]  Reason for Encounter: Patient Assistance Coordination  08/21/2020-  Patient assistance reorder form filled out for Ozempic with [employer] patient assistance program. Form printed, awaiting provider signature to fax.  08/24/2020- Reorder form received, faxed to [employer] Patient assistance program.  Medications: Outpatient Encounter Medications as of 08/21/2020  Medication Sig   amLODipine (NORVASC) 10 MG tablet TAKE 1 TABLET BY MOUTH EVERY DAY IN THE EVENING   aspirin 81 MG tablet Take 81 mg by mouth daily.   atorvastatin (LIPITOR) 20 MG tablet TAKE 1 TABLET BY MOUTH EVERY DAY   Blood Glucose Monitoring Suppl (ONE TOUCH ULTRA 2) w/Device KIT Use as directed to check blood sugars 2 times per day dx:e11.22   carboxymethylcellulose (REFRESH PLUS) 0.5 % SOLN Place 1 drop into both eyes daily.   chlorthalidone (HYGROTON) 25 MG tablet Take 25 mg by mouth daily.   cholecalciferol (VITAMIN D3) 25 MCG (1000 UNIT) tablet Take 1,000 Units by mouth daily.   famotidine (PEPCID) 20 MG tablet Take 20 mg by mouth daily.   gabapentin (NEURONTIN) 100 MG capsule TAKE 2 CAPSULES BY MOUTH AT BEDTIME   hydrALAZINE (APRESOLINE) 25 MG tablet TAKE 1 TABLET BY MOUTH TWICE A DAY   insulin degludec (TRESIBA FLEXTOUCH) 100 UNIT/ML SOPN FlexTouch Pen Inject 16 Units into the skin at bedtime.    Insulin Pen Needle 32G X 4 MM MISC USE WITH TRESIBA   LOKELMA 10 g PACK packet TAKE 1 PACKET DAILY X2 DAYS, THEN START TAKING EVERY OTHER DAY   meclizine (ANTIVERT) 25 MG tablet TAKE 1 TABLET (25 MG TOTAL) BY MOUTH 3 (THREE) TIMES DAILY AS NEEDED FOR DIZZINESS.   olmesartan (BENICAR) 20 MG tablet Take 20 mg by mouth daily.   OneTouch Delica Lancets 79Y MISC Use as directed to check blood sugars 2 times per day dx: e11.22   ONETOUCH ULTRA test strip USE AS INSTRUCTED TO CHECK BLOOD SUGARS 2 TIMES PER DAY DX: E11.22    propranolol ER (INDERAL LA) 120 MG 24 hr capsule TAKE 1 CAPSULE BY MOUTH EVERY DAY   Semaglutide, 1 MG/DOSE, 4 MG/3ML SOPN Inject 1 mg into the skin once a week.   sodium bicarbonate 650 MG tablet Take 650 mg by mouth daily.   triamcinolone cream (KENALOG) 0.1 % Apply 1 application topically 2 (two) times daily. Apply   [DISCONTINUED] methimazole (TAPAZOLE) 5 MG tablet TAKE 1 TABLET BY MOUTH EVERY DAY   No facility-administered encounter medications on file as of 08/21/2020.    Care Gaps: Zoster Vaccines- Shingrix- Overdue  PNA vac Low Risk Adult- Overdue since 11/28/2016   Annual Wellness visit completed 03/22/2020, next scheduled 04/03/2021  Star Rating Drugs: Atorvastatin 20 mg- Last filled 08/16/2020 for a 90 day supply at [place] filled 06/18/2020 PAP Olmesartan 20 mg- Last filled TAB 05/29/2020 for a 90 day supply at [place], [place] Pharmacist Assistant [phone]

## 2020-09-13 ENCOUNTER — Other Ambulatory Visit: Payer: Self-pay

## 2020-09-13 ENCOUNTER — Encounter: Payer: Self-pay | Admitting: Internal Medicine

## 2020-09-13 ENCOUNTER — Ambulatory Visit (INDEPENDENT_AMBULATORY_CARE_PROVIDER_SITE_OTHER): Payer: Medicare Other | Admitting: Internal Medicine

## 2020-09-13 VITALS — BP 132/80 | HR 84 | Ht 61.2 in | Wt 265.6 lb

## 2020-09-13 DIAGNOSIS — N1832 Chronic kidney disease, stage 3b: Secondary | ICD-10-CM

## 2020-09-13 DIAGNOSIS — I129 Hypertensive chronic kidney disease with stage 1 through stage 4 chronic kidney disease, or unspecified chronic kidney disease: Secondary | ICD-10-CM

## 2020-09-13 DIAGNOSIS — Z794 Long term (current) use of insulin: Secondary | ICD-10-CM | POA: Diagnosis not present

## 2020-09-13 DIAGNOSIS — Z23 Encounter for immunization: Secondary | ICD-10-CM | POA: Diagnosis not present

## 2020-09-13 DIAGNOSIS — Z6841 Body Mass Index (BMI) 40.0 and over, adult: Secondary | ICD-10-CM

## 2020-09-13 DIAGNOSIS — E1122 Type 2 diabetes mellitus with diabetic chronic kidney disease: Secondary | ICD-10-CM

## 2020-09-13 LAB — HEMOGLOBIN A1C
Est. average glucose Bld gHb Est-mCnc: 163 mg/dL
Hgb A1c MFr Bld: 7.3 % — ABNORMAL HIGH (ref 4.8–5.6)

## 2020-09-13 MED ORDER — GABAPENTIN 300 MG PO CAPS: 300.0000 mg | ORAL_CAPSULE | Freq: Every day | ORAL | 2 refills | Status: DC

## 2020-09-13 MED ORDER — PREVNAR 20 0.5 ML IM SUSY: 0.5000 mL | PREFILLED_SYRINGE | INTRAMUSCULAR | 0 refills | Status: AC

## 2020-09-13 NOTE — Patient Instructions (Signed)

## 2020-09-13 NOTE — Progress Notes (Signed)
I,Yamilka [employer] as a Education administrator for Teresa Greenland, MD.,have documented all relevant documentation on the behalf of Teresa Greenland, MD,as directed by  Teresa Greenland, MD while in the presence of Teresa Greenland, MD.  This visit occurred during the SARS-CoV-2 public health emergency.  Safety protocols were in place, including screening questions prior to the visit, additional usage of staff PPE, and extensive cleaning of exam room while observing appropriate contact time as indicated for disinfecting solutions.  Subjective:     Patient ID: Teresa Stanley , female    DOB: 1945-12-16 , 75 y.o.   MRN: [id number]   Chief Complaint  Patient presents with   Diabetes   Hypertension    HPI  She presents today for f/u DM/HTN f/u. She reports compliance with meds. She has noticed a big improvement in her blood sugars. She is pleased w/ her progress thus far. She has not had any issues with Ozempic.   Diabetes She presents for her follow-up diabetic visit. She has type 2 diabetes mellitus. Her disease course has been stable. There are no hypoglycemic associated symptoms. Pertinent negatives for diabetes include no blurred vision and no chest pain. There are no hypoglycemic complications. Diabetic complications include nephropathy. Risk factors for coronary artery disease include diabetes mellitus, dyslipidemia, obesity, post-menopausal and sedentary lifestyle. She is compliant with treatment some of the time. Her weight is decreasing steadily. She is following a generally healthy diet. She participates in exercise intermittently. Her breakfast blood glucose is taken between 8-9 am. Her breakfast blood glucose range is generally 130-140 mg/dl. An ACE inhibitor/angiotensin II receptor blocker is being taken. Eye exam is current.  Hypertension The current episode started more than 1 year ago. The problem has been gradually improving since onset. The problem is uncontrolled. Pertinent negatives  include no blurred vision, chest pain, palpitations or shortness of breath. The current treatment provides moderate improvement. Compliance problems include exercise.  Hypertensive end-organ damage includes kidney disease.    Past Medical History:  Diagnosis Date   Arthritis    Diabetes mellitus    takes Actos and Metformin daily and Victoza   GERD (gastroesophageal reflux disease)    takes Omeprazole daily   Headache(784.0)    occasionally   Hyperlipidemia    takes Atorvastatin daily   Hypertension    takes Lisinopril,Amlodipine,and Metoprolol daily   Hypothyroidism    Joint pain    Thyroid disease    ?     Family History  Problem Relation Age of Onset   Diabetes Mother    Hypertension Mother    Diabetes Father    Asthma Father    Hypertension Father    Hypertension Daughter    Hypertension Sister        2   Diabetes Sister    Hypertension Brother        5   Kidney disease Brother    Colon cancer Neg Hx    Esophageal cancer Neg Hx    Rectal cancer Neg Hx    Stomach cancer Neg Hx      Current Outpatient Medications:    amLODipine (NORVASC) 10 MG tablet, TAKE 1 TABLET BY MOUTH EVERY DAY IN THE EVENING, Disp: 90 tablet, Rfl: 2   aspirin 81 MG tablet, Take 81 mg by mouth daily., Disp: , Rfl:    atorvastatin (LIPITOR) 20 MG tablet, TAKE 1 TABLET BY MOUTH EVERY DAY, Disp: 90 tablet, Rfl: 2   Blood Glucose Monitoring Suppl (ONE TOUCH  ULTRA 2) w/Device KIT, Use as directed to check blood sugars 2 times per day dx:e11.22, Disp: 1 each, Rfl: 1   carboxymethylcellulose (REFRESH PLUS) 0.5 % SOLN, Place 1 drop into both eyes daily., Disp: , Rfl:    chlorthalidone (HYGROTON) 25 MG tablet, Take 25 mg by mouth daily., Disp: , Rfl:    cholecalciferol (VITAMIN D3) 25 MCG (1000 UNIT) tablet, Take 1,000 Units by mouth daily., Disp: , Rfl:    famotidine (PEPCID) 20 MG tablet, Take 20 mg by mouth daily., Disp: , Rfl:    gabapentin (NEURONTIN) 300 MG capsule, Take 1 capsule (300 mg  total) by mouth at bedtime., Disp: 90 capsule, Rfl: 2   hydrALAZINE (APRESOLINE) 25 MG tablet, TAKE 1 TABLET BY MOUTH TWICE A DAY, Disp: 60 tablet, Rfl: 1   insulin degludec (TRESIBA FLEXTOUCH) 100 UNIT/ML SOPN FlexTouch Pen, Inject 16 Units into the skin at bedtime. , Disp: , Rfl:    Insulin Pen Needle 32G X 4 MM MISC, USE WITH TRESIBA, Disp: 100 each, Rfl: 10   LOKELMA 10 g PACK packet, TAKE 1 PACKET DAILY X2 DAYS, THEN START TAKING EVERY OTHER DAY, Disp: , Rfl:    meclizine (ANTIVERT) 25 MG tablet, TAKE 1 TABLET (25 MG TOTAL) BY MOUTH 3 (THREE) TIMES DAILY AS NEEDED FOR DIZZINESS., Disp: 30 tablet, Rfl: 0   methimazole (TAPAZOLE) 5 MG tablet, TAKE 1 TABLET BY MOUTH EVERY DAY, Disp: 90 tablet, Rfl: 1   olmesartan (BENICAR) 20 MG tablet, Take 20 mg by mouth daily., Disp: , Rfl:    OneTouch Delica Lancets 08M MISC, Use as directed to check blood sugars 2 times per day dx: e11.22, Disp: 300 each, Rfl: 2   ONETOUCH ULTRA test strip, USE AS INSTRUCTED TO CHECK BLOOD SUGARS 2 TIMES PER DAY DX: E11.22, Disp: 200 strip, Rfl: 4   propranolol ER (INDERAL LA) 120 MG 24 hr capsule, TAKE 1 CAPSULE BY MOUTH EVERY DAY, Disp: 90 capsule, Rfl: 2   Semaglutide, 1 MG/DOSE, 4 MG/3ML SOPN, Inject 1 mg into the skin once a week., Disp: 3 mL, Rfl: 2   sodium bicarbonate 650 MG tablet, Take 650 mg by mouth daily., Disp: , Rfl:    triamcinolone cream (KENALOG) 0.1 %, Apply 1 application topically 2 (two) times daily. Apply, Disp: 45 g, Rfl: 1   No Known Allergies   Review of Systems  Constitutional: Negative.   Eyes:  Negative for blurred vision.  Respiratory: Negative.  Negative for shortness of breath.   Cardiovascular: Negative.  Negative for chest pain and palpitations.  Gastrointestinal: Negative.   Neurological: Negative.   Psychiatric/Behavioral: Negative.      Today's Vitals   09/13/20 0914  BP: 132/80  Pulse: 84  Weight: 265 lb 9.6 oz (120.5 kg)  Height: 5' 1.2" (1.554 m)  PainSc: 0-No pain    Body mass index is 49.86 kg/m.  Wt Readings from Last 3 Encounters:  09/13/20 265 lb 9.6 oz (120.5 kg)  06/18/20 260 lb 9.6 oz (118.2 kg)  03/22/20 261 lb 12.8 oz (118.8 kg)     Objective:  Physical Exam Vitals and nursing note reviewed.  Constitutional:      Appearance: Normal appearance. She is obese.  HENT:     Head: Normocephalic and atraumatic.     Nose:     Comments: Masked     Mouth/Throat:     Comments: Masked  Cardiovascular:     Rate and Rhythm: Normal rate and regular rhythm.  Heart sounds: Normal heart sounds.  Pulmonary:     Effort: Pulmonary effort is normal.     Breath sounds: Normal breath sounds.  Musculoskeletal:     Cervical back: Normal range of motion.  Skin:    General: Skin is warm.  Neurological:     General: No focal deficit present.     Mental Status: She is alert.  Psychiatric:        Mood and Affect: Mood normal.        Behavior: Behavior normal.        Assessment And Plan:     1. Type 2 diabetes mellitus with stage 3b chronic kidney disease, with long-term current use of insulin (HCC) Comments: Chronic, I will check labs as listed below. I will defer renal testing, she was recently seen by Renal. I will request most recent office note/lab results.  - Hemoglobin A1c  2. Hypertensive nephropathy Comments: Chronic, controlled. Encouraged to follow low sodium diet.   3. Class 3 severe obesity due to excess calories with serious comorbidity and body mass index (BMI) of 45.0 to 49.9 in adult [facility]) Comments: BMI 49. She ise ncouraged to strive to lose 10-15 pounds in the next several months. Reminded to perform chair exercises while watching TV.   4. Immunization due Comments: I will send rx [id number] to her local pharmacy.   Patient was given opportunity to ask questions. Patient verbalized understanding of the plan and was able to repeat key elements of the plan. All questions were answered to their satisfaction.   I, Teresa Greenland, MD, have reviewed all documentation for this visit. The documentation on 09/17/20 for the exam, diagnosis, procedures, and orders are all accurate and complete.   IF YOU HAVE BEEN REFERRED TO A SPECIALIST, IT MAY TAKE 1-2 WEEKS TO SCHEDULE/PROCESS THE REFERRAL. IF YOU HAVE NOT HEARD FROM US/SPECIALIST IN TWO WEEKS, PLEASE GIVE [place] A CALL AT [phone].   THE PATIENT IS ENCOURAGED TO PRACTICE SOCIAL DISTANCING DUE TO THE COVID-19 PANDEMIC.

## 2020-09-19 ENCOUNTER — Telehealth: Payer: Self-pay

## 2020-09-19 NOTE — Telephone Encounter (Signed)
The pt was notified that her ozempic is ready for pickup.

## 2020-09-24 ENCOUNTER — Ambulatory Visit: Payer: Medicare Other | Admitting: Internal Medicine

## 2020-09-26 ENCOUNTER — Ambulatory Visit: Payer: Self-pay

## 2020-09-26 ENCOUNTER — Telehealth: Payer: Medicare Other

## 2020-09-26 DIAGNOSIS — Z6841 Body Mass Index (BMI) 40.0 and over, adult: Secondary | ICD-10-CM

## 2020-09-26 DIAGNOSIS — I129 Hypertensive chronic kidney disease with stage 1 through stage 4 chronic kidney disease, or unspecified chronic kidney disease: Secondary | ICD-10-CM

## 2020-09-26 DIAGNOSIS — N1832 Chronic kidney disease, stage 3b: Secondary | ICD-10-CM

## 2020-09-26 DIAGNOSIS — E1122 Type 2 diabetes mellitus with diabetic chronic kidney disease: Secondary | ICD-10-CM

## 2020-09-26 NOTE — Chronic Care Management (AMB) (Signed)
  Care Management   Follow Up Note   09/26/2020 Name: Teresa Stanley MRN: [id number] DOB: 06-Jul-1945   Referred by: [name], MD Reason for referral : No chief complaint on file.   Third unsuccessful telephone outreach was attempted today. The patient was referred to the case management team for assistance with care management and care coordination. The patient's primary care provider has been notified of our unsuccessful attempts to make or maintain contact with the patient. The care management team is pleased to engage with this patient at any time in the future should he/she be interested in assistance from the care management team.   Follow Up Plan: We have been unable to make contact with the patient for follow up. The care management team is available to follow up with the patient after provider conversation with the patient regarding recommendation for care management engagement and subsequent re-referral to the care management team.   Barb Merino, RN, BSN, CCM Care Management Coordinator [place] [facility]  Direct Phone: [phone]

## 2020-09-27 ENCOUNTER — Telehealth: Payer: Self-pay

## 2020-09-27 ENCOUNTER — Telehealth: Payer: Medicare Other

## 2020-09-27 NOTE — Telephone Encounter (Signed)
  Care Management   Follow Up Note   09/27/2020 Name: Teresa Stanley MRN: [id number] DOB: 1945/08/16   Referred by: [name], MD Reason for referral : Care Coordination (Inbound return call from patient )  Voice message received from patient stating she is returning my call from yesterday. An unsuccessful telephone outreach was attempted today. The patient was referred to the case management team for assistance with care management and care coordination.   Follow Up Plan: We have been unable to make contact with the patient for follow up. The care management team is available to follow up with the patient after provider conversation with the patient regarding recommendation for care management engagement and subsequent re-referral to the care management team.   Barb Merino, RN, BSN, CCM Care Management Coordinator [place] [facility]  Direct Phone: [phone]

## 2020-10-01 ENCOUNTER — Other Ambulatory Visit: Payer: Self-pay | Admitting: Internal Medicine

## 2020-10-01 ENCOUNTER — Telehealth: Payer: Self-pay

## 2020-10-01 NOTE — Chronic Care Management (AMB) (Signed)
    Chronic Care Management Pharmacy Assistant   Name: [name]  MRN: [id number] DOB: 1945-09-30  10/01/2020- APPOINTMENT REMINDER/ Care Coordination with Outside Provider   Teresa Stanley was reminded to have all medications, supplements and any blood glucose and blood pressure readings available for review with [name], Pharm. D, at her telephone visit on 10/02/2020 at 12:15 PM.   Questions: Have you had any recent office visit or specialist visit outside of [place]?None per patient  Are there any concerns you would like to discuss during your office visit? Not at this time per patient.   Are you having any problems obtaining your medications? (Whether it pharmacy issues or cost). Patient states she called office to request a refill of her chlorthalidone (HYGROTON) 25 MG tablet, refill for this medication not noted, sent message to [place] to request but not sure if this will come from PCP or Nephrology, waiting on reply. Per fill history, last refilled by Dr Candiss Norse- Nephrology, calling office to request refill.   If patient has any PAP medications ask if they are having any problems getting their PAP medication or refill? Patient gets her Ozempic through [employer] patient assistance program.   Care Gaps: Zoster Vaccines- Shingrix- Overdue PNA vac Low Risk Adult (2 of 2 - PCV13)- Last completed: Nov 29, 2015  INFLUENZA VACCINE (Every 8 Months, August to March)- Last completed: Nov 22, 2019 Annual Wellness visit completed on 03/22/2020, next scheduled for 04/03/2021.   Star Rating Drug: Atorvastatin 20 mg- Last filled 08/16/2020 for [facility] day supply at [facility]. Olmesartan 20 mg- Last fileld 08/25/2020 for [facility] day supply at [facility]. Ozempic 1 mg- Last filled on 06/18/2020 for 28 day supply at [facility].- Patient received through PAP   Any gaps in medications fill history?Chlorthalidone- request for refill sent.  *Called Dr Candiss Norse office, per representative the office was closed due to a  water break in the office but they will return on Tuesday 10/02/2020. Left message for on-call provider for refill of medication due to patient running out by 10/02/2020. Called patient to inform, she did inform me that she will have enough medication until Thursday. Waiting on return call from Nephrology office for refill request.  Received a call back from Dr Candiss Norse, he will [facility] now. Patient notified.   [name], [place] Pharmacist Assistant [phone]

## 2020-10-02 ENCOUNTER — Ambulatory Visit (INDEPENDENT_AMBULATORY_CARE_PROVIDER_SITE_OTHER): Payer: Medicare Other

## 2020-10-02 ENCOUNTER — Other Ambulatory Visit: Payer: Self-pay | Admitting: Internal Medicine

## 2020-10-02 DIAGNOSIS — Z794 Long term (current) use of insulin: Secondary | ICD-10-CM

## 2020-10-02 DIAGNOSIS — I10 Essential (primary) hypertension: Secondary | ICD-10-CM | POA: Diagnosis not present

## 2020-10-02 DIAGNOSIS — E1122 Type 2 diabetes mellitus with diabetic chronic kidney disease: Secondary | ICD-10-CM

## 2020-10-02 DIAGNOSIS — N1832 Chronic kidney disease, stage 3b: Secondary | ICD-10-CM

## 2020-10-02 DIAGNOSIS — I129 Hypertensive chronic kidney disease with stage 1 through stage 4 chronic kidney disease, or unspecified chronic kidney disease: Secondary | ICD-10-CM | POA: Diagnosis not present

## 2020-10-02 NOTE — Patient Instructions (Signed)
Visit Information It was great speaking with you today!  Please let me know if you have any questions about our visit.   Goals Addressed             This Visit's Progress    Manage My Medicine       Timeframe:  Long-Range Goal Priority:  High Start Date:                             Expected End Date:                       Follow Up Date 04/02/2021  In Process:   - call for medicine refill 2 or 3 days before it runs out - call if I am sick and can't take my medicine - use a pillbox to sort medicine - use an alarm clock or phone to remind me to take my medicine    Why is this important?   These steps will help you keep on track with your medicines.   Notes:  Try to take your medication at the same time each day Keep up the good work        Patient Care Plan: Pharmacy Care Plan     Problem Identified: HTN, DM II   Priority: High     Long-Range Goal: Disease Management   Recent Progress: On track  Note:   Current Barriers:  Unable to independently afford treatment regimen Unable to independently monitor therapeutic efficacy  Pharmacist Clinical Goal(s):  Patient will achieve adherence to monitoring guidelines and medication adherence to achieve therapeutic efficacy through collaboration with PharmD and provider.   Interventions: 1:1 collaboration with [name], MD regarding development and update of comprehensive plan of care as evidenced by provider attestation and co-signature Inter-disciplinary care team collaboration (see longitudinal plan of care) Comprehensive medication review performed; medication list updated in electronic medical record  Hypertension (BP goal <130/80) -Controlled -Current treatment: Chlorthalidone 25 mg tablet once per day Hydralazine 25 mg tablet twice per day Propranolol ER 120 mg capsule- take 1 capsule by mouth every day Olmesartan 20 mg tablet once per day   -Current home readings: 140/85, 142/82, 118/70, 145/69,  132/76 -Current dietary habits: Patient is avoiding fried and fatty foods, and no longer using salt -Current exercise habits:  Patient is working out with weights, and 30 minutes every other day  -Denies hypotensive/hypertensive symptoms -Educated on Exercise goal of 150 minutes per week; Importance of home blood pressure monitoring; -Counseled to monitor BP at home at least five times per week, document, and provide log at future appointments -Recommended to continue current medication  Diabetes (A1c goal <7%) -Not ideally controlled -Current medications: Ozempic 1 mg once a week.  -Current home glucose readings fasting glucose: 109,117,120 -Denies hypoglycemic/hyperglycemic symptoms -Current meal patterns:  breakfast: a piece of toast and sausage, special K with milk, or [place] bacon with egg  lunch: [place] sandwich, tomato sandwich   dinner: salmon, asparagus, baked chicken, rice and green beans, meat loaf, greens, squash  snacks: she is not eating any snacks  drinks: coffee, drinking plenty of water - at least 3 bottles, she is drinking small ginger ale - only one or two per week  -Current exercise: Patient is working out with weights, and 30 minutes every other day  -Educated on Complications of diabetes including kidney damage, retinal damage, and cardiovascular disease; Exercise goal of 150 minutes per  week; -Counseled to check feet daily and get yearly eye exams -Recommended to continue current medication  Patient Goals/Self-Care Activities Patient will:  - take medications as prescribed target a minimum of 150 minutes of moderate intensity exercise weekly  Follow Up Plan: The patient has been provided with contact information for the care management team and has been advised to call with any health related questions or concerns.       Patient agreed to services and verbal consent obtained.   The patient verbalized understanding of instructions, educational materials,  and care plan provided today and agreed to receive a mailed copy of patient instructions, educational materials, and care plan.   [name], PharmD Clinical Pharmacist [facility] [phone]

## 2020-10-02 NOTE — Progress Notes (Signed)
Chronic Care Management Pharmacy Note  10/02/2020 Name:  Teresa Stanley MRN:  [id number] DOB:  [birth year]  Summary: Patient reports getting her BP cuff from insurance. Patient would like to lose weight gradually.   Recommendations/Changes made from today's visit: Recommend patient receive shingrix vaccine, influenza and PCV-15 vaccine.   Plan: Going to send information about the mediterranean diet to the patient.  Patient is going to have her daughter assist her with signing up for the shingles vaccine   Subjective: Teresa Stanley is an 75 y.o. year old female who is a primary patient of [name], MD.  The CCM team was consulted for assistance with disease management and care coordination needs.    Engaged with patient by telephone for follow up visit in response to provider referral for pharmacy case management and/or care coordination services.   Consent to Services:  The patient was given information about Chronic Care Management services, agreed to services, and gave verbal consent prior to initiation of services.  Please see initial visit note for detailed documentation.   Patient Care Team: [name], MD as PCP - General (Internal Medicine)  Recent office visits: 09/13/2020 PCP OV  Recent consult visits: 07/12/2020 [facility] visits: None in previous 6 months   Objective:  Lab Results  Component Value Date   CREATININE 1.56 (H) 06/18/2020   BUN 36 (H) 06/18/2020   GFR 39.42 (L) 12/25/2017   GFRNONAA 38 (L) 03/22/2020   GFRAA 43 (L) 03/22/2020   NA 138 06/18/2020   K 5.2 06/18/2020   CALCIUM 10.0 06/18/2020   CO2 23 06/18/2020   GLUCOSE 191 (H) 06/18/2020    Lab Results  Component Value Date/Time   HGBA1C 7.3 (H) 09/13/2020 09:48 AM   HGBA1C 8.4 (H) 06/18/2020 12:10 PM   GFR 39.42 (L) 12/25/2017 03:08 PM   MICROALBUR 150 06/18/2020 12:27 PM   MICROALBUR 80 06/16/2019 02:39 PM    Last diabetic Eye exam:  Lab Results  Component Value  Date/Time   HMDIABEYEEXA No Retinopathy 04/30/2020 12:00 AM    Last diabetic Foot exam: No results found for: HMDIABFOOTEX   Lab Results  Component Value Date   CHOL 175 03/22/2020   HDL 65 03/22/2020   LDLCALC 94 03/22/2020   TRIG 86 03/22/2020   CHOLHDL 2.7 03/22/2020    Hepatic Function Latest Ref Rng & Units 03/22/2020 06/16/2019 03/17/2019  Total Protein 6.0 - 8.5 g/dL 7.4 7.4 7.1  Albumin 3.7 - 4.7 g/dL 4.3 - 4.3  AST 0 - 40 IU/L 13 - 15  ALT 0 - 32 IU/L 12 - 10  Alk Phosphatase 44 - 121 IU/L 157(H) - 175(H)  Total Bilirubin 0.0 - 1.2 mg/dL 0.4 - 0.4    Lab Results  Component Value Date/Time   TSH 2.930 03/22/2020 11:12 AM   TSH 0.005 (L) 08/23/2009 07:54 PM   FREET4 1.73 03/22/2020 11:12 AM   FREET4 1.33 08/23/2009 07:54 PM    CBC Latest Ref Rng & Units 06/18/2020 03/22/2020 06/16/2019  WBC 3.4 - 10.8 x10E3/uL 8.4 7.5 7.9  Hemoglobin 11.1 - 15.9 g/dL 12.9 12.9 13.2  Hematocrit 34.0 - 46.6 % 40.5 41.2 40.9  Platelets 150 - 450 x10E3/uL 388 305 271    No results found for: VD25OH  Clinical ASCVD: No  The 10-year ASCVD risk score Mikey Bussing DC Jr., et al., 2013) is: 29.5%   Values used to calculate the score:     Age: 75 years  Sex: Female     Is Non-Hispanic African American: Yes     Diabetic: Yes     Tobacco smoker: No     Systolic Blood Pressure: 109 mmHg     Is BP treated: Yes     HDL Cholesterol: 65 mg/dL     Total Cholesterol: 175 mg/dL    Depression screen [facility] 2/9 03/22/2020 03/17/2019 05/27/2018  Decreased Interest 0 0 0  Down, Depressed, Hopeless 0 0 0  PHQ - 2 Score 0 0 0  Altered sleeping - 0 0  Tired, decreased energy - 0 0  Change in appetite - 0 0  Feeling bad or failure about yourself  - 0 0  Trouble concentrating - 0 0  Moving slowly or fidgety/restless - 0 0  Suicidal thoughts - 0 0  PHQ-9 Score - 0 0  Difficult doing work/chores - Not difficult at all -  Some recent data might be hidden      Social History   Tobacco Use  Smoking Status  Never  Smokeless Tobacco Never   BP Readings from Last 3 Encounters:  09/13/20 132/80  06/18/20 138/72  03/22/20 (!) 160/110   Pulse Readings from Last 3 Encounters:  09/13/20 84  06/18/20 88  03/22/20 92   Wt Readings from Last 3 Encounters:  09/13/20 265 lb 9.6 oz (120.5 kg)  06/18/20 260 lb 9.6 oz (118.2 kg)  03/22/20 261 lb 12.8 oz (118.8 kg)   BMI Readings from Last 3 Encounters:  09/13/20 49.86 kg/m  06/18/20 47.66 kg/m  03/22/20 47.88 kg/m    Assessment/Interventions: Review of patient past medical history, allergies, medications, health status, including review of consultants reports, laboratory and other test data, was performed as part of comprehensive evaluation and provision of chronic care management services.   SDOH:  (Social Determinants of Health) assessments and interventions performed: No  SDOH Screenings   Alcohol Screen: Not on file  Depression (PHQ2-9): Low Risk    PHQ-2 Score: 0  Financial Resource Strain: Low Risk    Difficulty of Paying Living Expenses: Not hard at all  Food Insecurity: No Food Insecurity   Worried About Charity fundraiser in the Last Year: Never true   Ran Out of Food in the Last Year: Never true  Housing: Not on file  Physical Activity: Sufficiently Active   Days of Exercise per Week: 5 days   Minutes of Exercise per Session: 30 min  Social Connections: Not on file  Stress: No Stress Concern Present   Feeling of Stress : Not at all  Tobacco Use: Low Risk    Smoking Tobacco Use: Never   Smokeless Tobacco Use: Never  Transportation Needs: No Transportation Needs   Lack of Transportation (Medical): No   Lack of Transportation (Non-Medical): No    CCM Care Plan  No Known Allergies  Medications Reviewed Today     Reviewed by Mayford Knife, RPH (Pharmacist) on 10/02/20 at 1240  Med List Status: <None>   Medication Order Taking? Sig Documenting Provider Last Dose Status Informant  amLODipine (NORVASC) 10 MG  tablet [id number] No TAKE 1 TABLET BY MOUTH EVERY DAY IN THE Fredric Mare, MD Taking Active   aspirin 81 MG tablet [id number] No Take 81 mg by mouth daily. [provider] Taking Active   atorvastatin (LIPITOR) 20 MG tablet [id number] No TAKE 1 TABLET BY MOUTH EVERY DAY [name], MD Taking Active   Blood Glucose Monitoring Suppl (ONE TOUCH ULTRA 2) w/Device KIT  [id number] No Use as directed to check blood sugars 2 times per day dx:e11.22 [name], MD Taking Active   carboxymethylcellulose (REFRESH PLUS) 0.5 % SOLN [id number] No Place 1 drop into both eyes daily. [provider] Taking Active Self  chlorthalidone (HYGROTON) 25 MG tablet [id number] No Take 25 mg by mouth daily. [provider] Taking Active   cholecalciferol (VITAMIN D3) 25 MCG (1000 UNIT) tablet [id number] No Take 1,000 Units by mouth daily. [provider] Taking Active   famotidine (PEPCID) 20 MG tablet [id number] No Take 20 mg by mouth daily. [provider] Taking Active   gabapentin (NEURONTIN) 300 MG capsule [id number]  Take 1 capsule (300 mg total) by mouth at bedtime. [name], MD  Active   hydrALAZINE (APRESOLINE) 25 MG tablet [id number] No TAKE 1 TABLET BY MOUTH TWICE A Lynnell Dike, MD Taking Active   insulin degludec (TRESIBA FLEXTOUCH) 100 UNIT/ML SOPN FlexTouch Pen [id number] No Inject 16 Units into the skin at bedtime.  [provider] Taking Active   Insulin Pen Needle 32G X 4 MM MISC [id number] No USE WITH Thornton Papas, MD Taking Active   LOKELMA 10 g PACK packet [id number] No TAKE 1 PACKET DAILY X2 DAYS, THEN START TAKING EVERY OTHER DAY [provider] Taking Active   meclizine (ANTIVERT) 25 MG tablet [id number] No TAKE 1 TABLET (25 MG TOTAL) BY MOUTH 3 (THREE) TIMES DAILY AS NEEDED FOR DIZZINESS. [name], MD Taking Active   methimazole (TAPAZOLE) 5 MG tablet [id number] No TAKE 1 TABLET BY MOUTH EVERY DAY [name],  MD Taking Active   olmesartan (BENICAR) 20 MG tablet [id number] No Take 20 mg by mouth daily. [provider] Taking Active   OneTouch Delica Lancets 15Q [place] [id number] No Use as directed to check blood sugars 2 times per day dx: e11.22 [name], MD Taking Active   [facility] ULTRA test strip [id number] No USE AS INSTRUCTED TO CHECK BLOOD SUGARS 2 TIMES PER DAY DX: E11.22 [name], MD Taking Active   propranolol ER (INDERAL LA) 120 MG 24 hr capsule [id number] No TAKE 1 CAPSULE BY MOUTH EVERY DAY [name], MD Taking Active   Semaglutide, 1 MG/DOSE, 4 MG/3ML SOPN [id number] No Inject 1 mg into the skin once a week. [name], MD Taking Active   sodium bicarbonate 650 MG tablet [id number]  TAKE 1 TABLET BY MOUTH EVERY DAY [name], MD  Active   triamcinolone cream (KENALOG) 0.1 % [id number] No Apply 1 application topically 2 (two) times daily. 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   S/P total knee arthroplasty 04/18/2013   Unilateral primary osteoarthritis, right knee 04/04/2013    Class: Diagnosis of   Candidal intertrigo 08/09/2012   Hypertension 12/15/2011   Diabetes mellitus with stage 3 chronic kidney disease ([place]) 12/15/2011    Immunization History  Administered Date(s) Administered   Influenza, High Dose Seasonal PF 11/12/2017, 11/09/2018, 11/22/2019   PFIZER Comirnaty(Gray Top)Covid-19 Tri-Sucrose Vaccine 08/01/2020   PFIZER(Purple Top)SARS-COV-2 Vaccination 04/04/2019, 04/25/2019, 12/07/2019   Pneumococcal Polysaccharide-23 11/29/2015    Conditions to be addressed/monitored:  Hypertension and Diabetes  Care Plan : Pharmacy Care Plan  Updates made by Mayford Knife, RPH  since 10/02/2020 12:00 AM     Problem: HTN, DM II   Priority: High     Long-Range Goal: Disease Management   Recent Progress: On track  Note:   Current Barriers:  Unable to independently afford treatment regimen Unable to independently monitor therapeutic efficacy  Pharmacist Clinical Goal(s):  Patient will achieve adherence to monitoring guidelines and medication adherence to achieve therapeutic efficacy through collaboration with PharmD and provider.   Interventions: 1:1 collaboration with [name], MD regarding development and update of comprehensive plan of care as evidenced by provider attestation and co-signature Inter-disciplinary care team collaboration (see longitudinal plan of care) Comprehensive medication review performed; medication list updated in electronic medical record  Hypertension (BP goal <130/80) -Controlled -Current treatment: Chlorthalidone 25 mg tablet once per day Hydralazine 25 mg tablet twice per day Propranolol ER 120 mg capsule- take 1 capsule by mouth every day Olmesartan 20 mg tablet once per day   -Current home readings: 140/85, 142/82, 118/70, 145/69, 132/76 -Current dietary habits: Patient is avoiding fried and fatty foods, and no longer using salt -Current exercise habits:  Patient is working out with weights, and 30 minutes every other day  -Denies hypotensive/hypertensive symptoms -Educated on Exercise goal of 150 minutes per week; Importance of home blood pressure monitoring; -Counseled to monitor BP at home at least five times per week, document, and provide log at future appointments -Recommended to continue current medication  Diabetes (A1c goal <7%) -Not ideally controlled -Current medications: Ozempic 1 mg once a week.  -Current home glucose readings fasting glucose: 109,117,120 -Denies hypoglycemic/hyperglycemic symptoms -Current meal patterns:  breakfast: a piece of toast and sausage, special K with milk, or [place]  bacon with egg  lunch: [place] sandwich, tomato sandwich   dinner: salmon, asparagus, baked chicken, rice and green beans, meat loaf, greens, squash  snacks: she is not eating any snacks  drinks: coffee, drinking plenty of water - at least 3 bottles, she is drinking small ginger ale - only one or two per week  -Current exercise: Patient is working out with weights, and 30 minutes every other day  -Educated on Complications of diabetes including kidney damage, retinal damage, and cardiovascular disease; Exercise goal of 150 minutes per week; -Counseled to check feet daily and get yearly eye exams -Recommended to continue current medication  Patient Goals/Self-Care Activities Patient will:  - take medications as prescribed target a minimum of 150 minutes of moderate intensity exercise weekly  Follow Up Plan: The patient has been provided with contact information for the care management team and has been advised to call with any health related questions or concerns.       Medication Assistance:  Joni Reining obtained through [employer] n[employer] medication assistance program.  Enrollment ends 01/2021  Compliance/Adherence/Medication fill history: Care Gaps: Shingrix Vaccine Influenza Vaccine Pneumonia Vaccine  Star-Rating Drugs: Ozempic 1 mg once a week Olmesartan 20 mg tablet   Patient's [facility]  [id number]: [phone]: [phone] Uses pill box? Yes Pt endorses 90% compliance  We discussed: Benefits of medication synchronization, packaging and delivery as well as enhanced pharmacist oversight with [employer]. Patient decided to: Continue current medication management strategy  Care Plan and Follow Up Patient Decision:  Patient agrees to Care Plan and Follow-up.  Plan: The patient has been provided with contact information for the care management team and  has been advised to call with any health related questions or concerns.   [name], PharmD Clinical Pharmacist [facility] [phone]

## 2020-10-09 ENCOUNTER — Other Ambulatory Visit: Payer: Self-pay | Admitting: Internal Medicine

## 2020-10-09 ENCOUNTER — Telehealth: Payer: Self-pay

## 2020-10-09 MED ORDER — OLMESARTAN MEDOXOMIL 40 MG PO TABS: 40.0000 mg | ORAL_TABLET | Freq: Every day | ORAL | 2 refills | Status: AC

## 2020-10-09 NOTE — Telephone Encounter (Signed)
  Care Management   Follow Up Note   10/09/2020 Name: Teresa Stanley MRN: [id number] DOB: 22-Nov-1945   Referred by: [name], MD Reason for referral : No chief complaint on file.   [name] called today because she needs refills of her Olmesartan 20 mg tablet - patients dose increased to 2 tablets daily, needs a new prescription and Famotidine 20 mg tablet daily written by Dr. Candiss Norse at the [facility]. She needs the refills soon because she is almost out, she would like to know if I can help her with this. I let her know that I would contact that MD team to enquire about the medication being filled. His number is [phone]   Follow Up Plan: The patient has been provided with contact information for the care management team and has been advised to call with any health related questions or concerns.   [name], PharmD Clinical Pharmacist [facility] [phone]

## 2020-10-26 ENCOUNTER — Other Ambulatory Visit: Payer: Self-pay | Admitting: Internal Medicine

## 2020-11-01 ENCOUNTER — Telehealth: Payer: Self-pay

## 2020-11-01 NOTE — Chronic Care Management (AMB) (Signed)
Chronic Care Management Pharmacy Assistant   Name: Teresa Stanley  MRN: [id number] DOB: 06-04-1945   Reason for Encounter: Disease State/ Diabetes   Recent office visits:  None  Recent consult visits:  None  Hospital visits:  None in previous 6 months  Medications: Outpatient Encounter Medications as of 11/01/2020  Medication Sig   amLODipine (NORVASC) 10 MG tablet TAKE 1 TABLET BY MOUTH EVERY DAY IN THE EVENING   aspirin 81 MG tablet Take 81 mg by mouth daily.   atorvastatin (LIPITOR) 20 MG tablet TAKE 1 TABLET BY MOUTH EVERY DAY   Blood Glucose Monitoring Suppl (ONE TOUCH ULTRA 2) w/Device KIT Use as directed to check blood sugars 2 times per day dx:e11.22   carboxymethylcellulose (REFRESH PLUS) 0.5 % SOLN Place 1 drop into both eyes daily.   chlorthalidone (HYGROTON) 25 MG tablet Take 25 mg by mouth daily.   cholecalciferol (VITAMIN D3) 25 MCG (1000 UNIT) tablet Take 1,000 Units by mouth daily.   famotidine (PEPCID) 20 MG tablet Take 20 mg by mouth daily.   gabapentin (NEURONTIN) 300 MG capsule Take 1 capsule (300 mg total) by mouth at bedtime.   hydrALAZINE (APRESOLINE) 25 MG tablet TAKE 1 TABLET BY MOUTH TWICE A DAY   insulin degludec (TRESIBA FLEXTOUCH) 100 UNIT/ML SOPN FlexTouch Pen Inject 16 Units into the skin at bedtime.    Insulin Pen Needle 32G X 4 MM MISC USE WITH TRESIBA   LOKELMA 10 g PACK packet TAKE 1 PACKET DAILY X2 DAYS, THEN START TAKING EVERY OTHER DAY   meclizine (ANTIVERT) 25 MG tablet TAKE 1 TABLET (25 MG TOTAL) BY MOUTH 3 (THREE) TIMES DAILY AS NEEDED FOR DIZZINESS.   methimazole (TAPAZOLE) 5 MG tablet TAKE 1 TABLET BY MOUTH EVERY DAY   olmesartan (BENICAR) 40 MG tablet Take 1 tablet (40 mg total) by mouth daily.   OneTouch Delica Lancets 91P MISC Use as directed to check blood sugars 2 times per day dx: e11.22   ONETOUCH ULTRA test strip USE AS INSTRUCTED TO CHECK BLOOD SUGARS 2 TIMES PER DAY DX: E11.22   propranolol ER (INDERAL LA) 120 MG 24 hr  capsule TAKE 1 CAPSULE BY MOUTH EVERY DAY   Semaglutide, 1 MG/DOSE, 4 MG/3ML SOPN Inject 1 mg into the skin once a week.   sodium bicarbonate 650 MG tablet TAKE 1 TABLET BY MOUTH EVERY DAY   triamcinolone cream (KENALOG) 0.1 % Apply 1 application topically 2 (two) times daily. Apply   No facility-administered encounter medications on file as of 11/01/2020.  Recent Relevant Labs: Lab Results  Component Value Date/Time   HGBA1C 7.3 (H) 09/13/2020 09:48 AM   HGBA1C 8.4 (H) 06/18/2020 12:10 PM   MICROALBUR 150 06/18/2020 12:27 PM   MICROALBUR 80 06/16/2019 02:39 PM    Kidney Function Lab Results  Component Value Date/Time   CREATININE 1.56 (H) 06/18/2020 12:10 PM   CREATININE 1.39 (H) 03/22/2020 11:12 AM   CREATININE 1.00 (H) 07/02/2015 07:10 PM   CREATININE 1.00 07/17/2013 01:49 PM   GFR 39.42 (L) 12/25/2017 03:08 PM   GFRNONAA 38 (L) 03/22/2020 11:12 AM   GFRNONAA 58 (L) 07/02/2015 07:10 PM   GFRAA 43 (L) 03/22/2020 11:12 AM   GFRAA 67 07/02/2015 07:10 PM    Current antihyperglycemic regimen:  Ozempic 1 mg weekly Tresiba 100 unit/ml - inject 16 units every night   What recent interventions/DTPs have been made to improve glycemic control:  Educated on Exercise goal of 150 minutes per week Prevention  and management of hypoglycemic episodes Counseled to check feet daily and get yearly eye exams  Have there been any recent hospitalizations or ED visits since last visit with CPP? No  Patient denies hypoglycemic symptoms  Patient denies hyperglycemic symptoms  How often are you checking your blood sugar? once daily  What are your blood sugars ranging?  Fasting: 120 Before meals: None After meals: None Bedtime: None During the week, how often does your blood glucose drop below 70? Never  Are you checking your feet daily/regularly? Patient stated daily  Adherence Review: Is the patient currently on a STATIN medication? Yes Is the patient currently on ACE/ARB medication?  Yes Does the patient have >5 day gap between last estimated fill dates? No  11-01-2020: 1st attempt left VM  Care Gaps: Shingrix overdue Last A1C 09-13-2020 7.3 AWV 04-03-2021 Last foot exam 07-12-2020  Star Rating Drugs: Ozempic 1 mg- Patient assistance Atorvastatin 20 mg- Last filled 10-23-2020 [facility] Olmesartan 20 mg- Last filled 10-10-2020 [facility]  [place] Clinical Pharmacist Assistant [phone]

## 2020-11-17 ENCOUNTER — Other Ambulatory Visit: Payer: Self-pay | Admitting: Internal Medicine

## 2020-12-24 DIAGNOSIS — N390 Urinary tract infection, site not specified: Secondary | ICD-10-CM | POA: Diagnosis not present

## 2020-12-24 DIAGNOSIS — N1832 Chronic kidney disease, stage 3b: Secondary | ICD-10-CM | POA: Diagnosis not present

## 2020-12-25 ENCOUNTER — Other Ambulatory Visit: Payer: Self-pay | Admitting: Internal Medicine

## 2020-12-26 ENCOUNTER — Telehealth: Payer: Self-pay

## 2020-12-26 ENCOUNTER — Ambulatory Visit (INDEPENDENT_AMBULATORY_CARE_PROVIDER_SITE_OTHER): Payer: Medicare Other | Admitting: Internal Medicine

## 2020-12-26 ENCOUNTER — Encounter: Payer: Self-pay | Admitting: Internal Medicine

## 2020-12-26 ENCOUNTER — Other Ambulatory Visit: Payer: Self-pay

## 2020-12-26 VITALS — BP 130/80 | HR 83 | Temp 98.3°F | Ht 61.2 in | Wt 268.2 lb

## 2020-12-26 DIAGNOSIS — Z6841 Body Mass Index (BMI) 40.0 and over, adult: Secondary | ICD-10-CM

## 2020-12-26 DIAGNOSIS — Z794 Long term (current) use of insulin: Secondary | ICD-10-CM | POA: Diagnosis not present

## 2020-12-26 DIAGNOSIS — I129 Hypertensive chronic kidney disease with stage 1 through stage 4 chronic kidney disease, or unspecified chronic kidney disease: Secondary | ICD-10-CM

## 2020-12-26 DIAGNOSIS — K5909 Other constipation: Secondary | ICD-10-CM

## 2020-12-26 DIAGNOSIS — E66813 Obesity, class 3: Secondary | ICD-10-CM

## 2020-12-26 DIAGNOSIS — E1122 Type 2 diabetes mellitus with diabetic chronic kidney disease: Secondary | ICD-10-CM | POA: Diagnosis not present

## 2020-12-26 DIAGNOSIS — Z23 Encounter for immunization: Secondary | ICD-10-CM | POA: Diagnosis not present

## 2020-12-26 DIAGNOSIS — N1832 Chronic kidney disease, stage 3b: Secondary | ICD-10-CM | POA: Diagnosis not present

## 2020-12-26 NOTE — Chronic Care Management (AMB) (Signed)
    Chronic Care Management Pharmacy Assistant   Name: Teresa Stanley  MRN: [id number] DOB: 02-Nov-1945  Reason for Encounter: 2023    Medications: Outpatient Encounter Medications as of 12/26/2020  Medication Sig   amLODipine (NORVASC) 10 MG tablet TAKE 1 TABLET BY MOUTH EVERY DAY IN THE EVENING   aspirin 81 MG tablet Take 81 mg by mouth daily.   atorvastatin (LIPITOR) 20 MG tablet TAKE 1 TABLET BY MOUTH EVERY DAY   Blood Glucose Monitoring Suppl (ONE TOUCH ULTRA 2) w/Device KIT Use as directed to check blood sugars 2 times per day dx:e11.22   carboxymethylcellulose (REFRESH PLUS) 0.5 % SOLN Place 1 drop into both eyes daily.   chlorthalidone (HYGROTON) 25 MG tablet Take 25 mg by mouth daily.   cholecalciferol (VITAMIN D3) 25 MCG (1000 UNIT) tablet Take 1,000 Units by mouth daily.   famotidine (PEPCID) 20 MG tablet Take 20 mg by mouth daily.   gabapentin (NEURONTIN) 300 MG capsule Take 1 capsule (300 mg total) by mouth at bedtime.   hydrALAZINE (APRESOLINE) 25 MG tablet TAKE 1 TABLET BY MOUTH TWICE A DAY   insulin degludec (TRESIBA FLEXTOUCH) 100 UNIT/ML SOPN FlexTouch Pen Inject 16 Units into the skin at bedtime.    Insulin Pen Needle 32G X 4 MM MISC USE WITH TRESIBA   LOKELMA 10 g PACK packet TAKE 1 PACKET DAILY X2 DAYS, THEN START TAKING EVERY OTHER DAY   meclizine (ANTIVERT) 25 MG tablet TAKE 1 TABLET (25 MG TOTAL) BY MOUTH 3 (THREE) TIMES DAILY AS NEEDED FOR DIZZINESS.   methimazole (TAPAZOLE) 5 MG tablet TAKE 1 TABLET BY MOUTH EVERY DAY   olmesartan (BENICAR) 40 MG tablet Take 1 tablet (40 mg total) by mouth daily.   OneTouch Delica Lancets 67T MISC Use as directed to check blood sugars 2 times per day dx: e11.22   ONETOUCH ULTRA test strip USE AS INSTRUCTED TO CHECK BLOOD SUGARS 2 TIMES PER DAY DX: E11.22   propranolol ER (INDERAL LA) 120 MG 24 hr capsule TAKE 1 CAPSULE BY MOUTH EVERY DAY   Semaglutide, 1 MG/DOSE, 4 MG/3ML SOPN Inject 1 mg into the skin once a week.   sodium  bicarbonate 650 MG tablet TAKE 1 TABLET BY MOUTH EVERY DAY   triamcinolone cream (KENALOG) 0.1 % Apply 1 application topically 2 (two) times daily. Apply   No facility-administered encounter medications on file as of 12/26/2020.   12-26-2020: Initiated 2023 patient assistance for Ozempic and tresiba. Left patient a voicemail stating application will be mailed to sign and return to the office with income verification.  [place] Pharmacist Assistant [phone]

## 2020-12-26 NOTE — Patient Instructions (Signed)

## 2020-12-26 NOTE — Progress Notes (Signed)
Rich Brave [place],acting as a Education administrator for Maximino Greenland, MD.,have documented all relevant documentation on the behalf of Maximino Greenland, MD,as directed by  Maximino Greenland, MD while in the presence of Maximino Greenland, MD.  This visit occurred during the SARS-CoV-2 public health emergency.  Safety protocols were in place, including screening questions prior to the visit, additional usage of staff PPE, and extensive cleaning of exam room while observing appropriate contact time as indicated for disinfecting solutions.  Subjective:     Patient ID: Teresa Stanley , female    DOB: 11/16/1945 , 75 y.o.   MRN: [id number]   Chief Complaint  Patient presents with   Diabetes   Hypertension    HPI  She presents today for f/u DM/HTN f/u. She reports compliance with meds. She reports compliance with meds. She denies headaches, chest pain and shortness of breath.   Diabetes She presents for her follow-up diabetic visit. She has type 2 diabetes mellitus. Her disease course has been stable. There are no hypoglycemic associated symptoms. Pertinent negatives for diabetes include no blurred vision and no chest pain. There are no hypoglycemic complications. Diabetic complications include nephropathy. Risk factors for coronary artery disease include diabetes mellitus, dyslipidemia, obesity, post-menopausal and sedentary lifestyle. She is compliant with treatment some of the time. Her weight is decreasing steadily. She is following a generally healthy diet. She participates in exercise intermittently. Her breakfast blood glucose is taken between 8-9 am. Her breakfast blood glucose range is generally 130-140 mg/dl. An ACE inhibitor/angiotensin II receptor blocker is being taken. Eye exam is current.  Hypertension The current episode started more than 1 year ago. The problem has been gradually improving since onset. The problem is uncontrolled. Pertinent negatives include no blurred vision, chest pain,  palpitations or shortness of breath. The current treatment provides moderate improvement. Compliance problems include exercise.  Hypertensive end-organ damage includes kidney disease.    Past Medical History:  Diagnosis Date   Arthritis    Diabetes mellitus    takes Actos and Metformin daily and Victoza   GERD (gastroesophageal reflux disease)    takes Omeprazole daily   Headache(784.0)    occasionally   Hyperlipidemia    takes Atorvastatin daily   Hypertension    takes Lisinopril,Amlodipine,and Metoprolol daily   Hypothyroidism    Joint pain    Thyroid disease    ?     Family History  Problem Relation Age of Onset   Diabetes Mother    Hypertension Mother    Diabetes Father    Asthma Father    Hypertension Father    Hypertension Daughter    Hypertension Sister        2   Diabetes Sister    Hypertension Brother        5   Kidney disease Brother    Colon cancer Neg Hx    Esophageal cancer Neg Hx    Rectal cancer Neg Hx    Stomach cancer Neg Hx      Current Outpatient Medications:    amLODipine (NORVASC) 10 MG tablet, TAKE 1 TABLET BY MOUTH EVERY DAY IN THE EVENING, Disp: 90 tablet, Rfl: 2   aspirin 81 MG tablet, Take 81 mg by mouth daily., Disp: , Rfl:    atorvastatin (LIPITOR) 20 MG tablet, TAKE 1 TABLET BY MOUTH EVERY DAY, Disp: 90 tablet, Rfl: 2   Blood Glucose Monitoring Suppl (ONE TOUCH ULTRA 2) w/Device KIT, Use as directed to check blood sugars 2  times per day dx:e11.22, Disp: 1 each, Rfl: 1   carboxymethylcellulose (REFRESH PLUS) 0.5 % SOLN, Place 1 drop into both eyes daily., Disp: , Rfl:    chlorthalidone (HYGROTON) 25 MG tablet, Take 25 mg by mouth daily., Disp: , Rfl:    cholecalciferol (VITAMIN D3) 25 MCG (1000 UNIT) tablet, Take 1,000 Units by mouth daily., Disp: , Rfl:    famotidine (PEPCID) 20 MG tablet, Take 20 mg by mouth daily., Disp: , Rfl:    gabapentin (NEURONTIN) 300 MG capsule, Take 1 capsule (300 mg total) by mouth at bedtime., Disp: 90  capsule, Rfl: 2   hydrALAZINE (APRESOLINE) 25 MG tablet, TAKE 1 TABLET BY MOUTH TWICE A DAY, Disp: 60 tablet, Rfl: 1   insulin degludec (TRESIBA FLEXTOUCH) 100 UNIT/ML SOPN FlexTouch Pen, Inject 16 Units into the skin at bedtime. , Disp: , Rfl:    Insulin Pen Needle 32G X 4 MM MISC, USE WITH TRESIBA, Disp: 100 each, Rfl: 10   LOKELMA 10 g PACK packet, TAKE 1 PACKET DAILY X2 DAYS, THEN START TAKING EVERY OTHER DAY, Disp: , Rfl:    meclizine (ANTIVERT) 25 MG tablet, TAKE 1 TABLET (25 MG TOTAL) BY MOUTH 3 (THREE) TIMES DAILY AS NEEDED FOR DIZZINESS., Disp: 30 tablet, Rfl: 0   methimazole (TAPAZOLE) 5 MG tablet, TAKE 1 TABLET BY MOUTH EVERY DAY, Disp: 90 tablet, Rfl: 1   olmesartan (BENICAR) 40 MG tablet, Take 1 tablet (40 mg total) by mouth daily., Disp: 90 tablet, Rfl: 2   OneTouch Delica Lancets 26O MISC, Use as directed to check blood sugars 2 times per day dx: e11.22, Disp: 300 each, Rfl: 2   ONETOUCH ULTRA test strip, USE AS INSTRUCTED TO CHECK BLOOD SUGARS 2 TIMES PER DAY DX: E11.22, Disp: 200 strip, Rfl: 4   propranolol ER (INDERAL LA) 120 MG 24 hr capsule, TAKE 1 CAPSULE BY MOUTH EVERY DAY, Disp: 90 capsule, Rfl: 2   Semaglutide, 1 MG/DOSE, 4 MG/3ML SOPN, Inject 1 mg into the skin once a week., Disp: 3 mL, Rfl: 2   sodium bicarbonate 650 MG tablet, TAKE 1 TABLET BY MOUTH EVERY DAY, Disp: 90 tablet, Rfl: 3   triamcinolone cream (KENALOG) 0.1 %, Apply 1 application topically 2 (two) times daily. Apply, Disp: 45 g, Rfl: 1   No Known Allergies   Review of Systems  Constitutional: Negative.   Eyes:  Negative for blurred vision.  Respiratory: Negative.  Negative for shortness of breath.   Cardiovascular: Negative.  Negative for chest pain and palpitations.  Gastrointestinal:  Positive for constipation.  Neurological: Negative.   Psychiatric/Behavioral: Negative.      Today's Vitals   12/26/20 1407  BP: 130/80  Pulse: 83  Temp: 98.3 F (36.8 C)  Weight: 268 lb 3.2 oz (121.7 kg)   Height: 5' 1.2" (1.554 m)  PainSc: 0-No pain   Body mass index is 50.35 kg/m.   Wt Readings from Last 3 Encounters:  12/26/20 268 lb 3.2 oz (121.7 kg)  09/13/20 265 lb 9.6 oz (120.5 kg)  06/18/20 260 lb 9.6 oz (118.2 kg)     Objective:  Physical Exam Vitals and nursing note reviewed.  Constitutional:      Appearance: Normal appearance. She is obese.  HENT:     Head: Normocephalic and atraumatic.     Nose:     Comments: Masked     Mouth/Throat:     Comments: Masked  Eyes:     Extraocular Movements: Extraocular movements intact.  Cardiovascular:  Rate and Rhythm: Normal rate and regular rhythm.     Heart sounds: Normal heart sounds.  Pulmonary:     Effort: Pulmonary effort is normal.     Breath sounds: Normal breath sounds.  Abdominal:     General: Bowel sounds are normal.     Palpations: Abdomen is soft.  Musculoskeletal:     Cervical back: Normal range of motion.  Skin:    General: Skin is warm.  Neurological:     General: No focal deficit present.     Mental Status: She is alert.  Psychiatric:        Mood and Affect: Mood normal.        Behavior: Behavior normal.        Assessment And Plan:     1. Type 2 diabetes mellitus with stage 3b chronic kidney disease, with long-term current use of insulin (HCC) Comments: Chronic, I will check labs as listed below. Appreciate Renal input. Will not check renal function today, she has Renal appt next week.  - Lipid panel - Liver Profile - Hemoglobin A1c  2. Hypertensive nephropathy Comments: Chronic, controlled. NO med changes. She was congratulated for being at goal.  - Lipid panel  3. Chronic constipation Comments: Likely exacerbated by Ozempic. She is advised to add Miralax two to three days per week and to stay well hydrated.   4. Class 3 severe obesity due to excess calories with serious comorbidity and body mass index (BMI) of 50.0 to 59.9 in adult [facility]) Comments: BMI 50. She is aware of 3 lb weight  gain. Possibly exacerbated by constipation.  She is encouraged to remain active during the holidays.   5. Immunization due Comments: She was given high dose flu vaccine today.  - Flu Vaccine QUAD High Dose(Fluad)   Patient was given opportunity to ask questions. Patient verbalized understanding of the plan and was able to repeat key elements of the plan. All questions were answered to their satisfaction.   I, Maximino Greenland, MD, have reviewed all documentation for this visit. The documentation on 12/26/20 for the exam, diagnosis, procedures, and orders are all accurate and complete.   IF YOU HAVE BEEN REFERRED TO A SPECIALIST, IT MAY TAKE 1-2 WEEKS TO SCHEDULE/PROCESS THE REFERRAL. IF YOU HAVE NOT HEARD FROM US/SPECIALIST IN TWO WEEKS, PLEASE GIVE [place] A CALL AT [phone].   THE PATIENT IS ENCOURAGED TO PRACTICE SOCIAL DISTANCING DUE TO THE COVID-19 PANDEMIC.

## 2020-12-27 LAB — LIPID PANEL
Chol/HDL Ratio: 2.8 ratio (ref 0.0–4.4)
Cholesterol, Total: 164 mg/dL (ref 100–199)
HDL: 59 mg/dL (ref >39)
LDL Chol Calc (NIH): 82 mg/dL (ref 0–99)
Triglycerides: 133 mg/dL (ref 0–149)
VLDL Cholesterol Cal: 23 mg/dL (ref 5–40)

## 2020-12-27 LAB — HEPATIC FUNCTION PANEL
ALT: 12 IU/L (ref 0–32)
AST: 16 IU/L (ref 0–40)
Albumin: 4.2 g/dL (ref 3.7–4.7)
Alkaline Phosphatase: 135 IU/L — ABNORMAL HIGH (ref 44–121)
Bilirubin Total: 0.3 mg/dL (ref 0.0–1.2)
Bilirubin, Direct: <0.1 mg/dL (ref 0.00–0.40)
Total Protein: 7 g/dL (ref 6.0–8.5)

## 2020-12-27 LAB — HEMOGLOBIN A1C
Est. average glucose Bld gHb Est-mCnc: 163 mg/dL
Hgb A1c MFr Bld: 7.3 % — ABNORMAL HIGH (ref 4.8–5.6)

## 2021-01-01 ENCOUNTER — Other Ambulatory Visit: Payer: Self-pay | Admitting: Internal Medicine

## 2021-01-02 DIAGNOSIS — E875 Hyperkalemia: Secondary | ICD-10-CM | POA: Diagnosis not present

## 2021-01-02 DIAGNOSIS — N1832 Chronic kidney disease, stage 3b: Secondary | ICD-10-CM | POA: Diagnosis not present

## 2021-01-02 DIAGNOSIS — N2581 Secondary hyperparathyroidism of renal origin: Secondary | ICD-10-CM | POA: Diagnosis not present

## 2021-01-02 DIAGNOSIS — E872 Acidosis, unspecified: Secondary | ICD-10-CM | POA: Diagnosis not present

## 2021-01-02 DIAGNOSIS — R809 Proteinuria, unspecified: Secondary | ICD-10-CM | POA: Diagnosis not present

## 2021-01-02 DIAGNOSIS — D631 Anemia in chronic kidney disease: Secondary | ICD-10-CM | POA: Diagnosis not present

## 2021-01-02 DIAGNOSIS — I129 Hypertensive chronic kidney disease with stage 1 through stage 4 chronic kidney disease, or unspecified chronic kidney disease: Secondary | ICD-10-CM | POA: Diagnosis not present

## 2021-01-02 DIAGNOSIS — K219 Gastro-esophageal reflux disease without esophagitis: Secondary | ICD-10-CM | POA: Diagnosis not present

## 2021-01-08 ENCOUNTER — Telehealth: Payer: Self-pay

## 2021-01-08 NOTE — Chronic Care Management (AMB) (Signed)
Chronic Care Management Pharmacy Assistant   Name: Teresa Stanley  MRN: [id number] DOB: 01-08-1946   Reason for Encounter: Disease State/ Diabetes  Recent office visits:  12-26-2020 [name], MD. A1C= 7.3. Alkaline phosphate= 135  Recent consult visits:  None  Hospital visits:  None in previous 6 months  Medications: Outpatient Encounter Medications as of 01/08/2021  Medication Sig   amLODipine (NORVASC) 10 MG tablet TAKE 1 TABLET BY MOUTH EVERY DAY IN THE EVENING   aspirin 81 MG tablet Take 81 mg by mouth daily.   atorvastatin (LIPITOR) 20 MG tablet TAKE 1 TABLET BY MOUTH EVERY DAY   Blood Glucose Monitoring Suppl (ONE TOUCH ULTRA 2) w/Device KIT Use as directed to check blood sugars 2 times per day dx:e11.22   carboxymethylcellulose (REFRESH PLUS) 0.5 % SOLN Place 1 drop into both eyes daily.   chlorthalidone (HYGROTON) 25 MG tablet Take 25 mg by mouth daily.   cholecalciferol (VITAMIN D3) 25 MCG (1000 UNIT) tablet Take 1,000 Units by mouth daily.   famotidine (PEPCID) 20 MG tablet Take 20 mg by mouth daily.   gabapentin (NEURONTIN) 300 MG capsule Take 1 capsule (300 mg total) by mouth at bedtime.   hydrALAZINE (APRESOLINE) 25 MG tablet TAKE 1 TABLET BY MOUTH TWICE A DAY   insulin degludec (TRESIBA FLEXTOUCH) 100 UNIT/ML SOPN FlexTouch Pen Inject 16 Units into the skin at bedtime.    Insulin Pen Needle 32G X 4 MM MISC USE WITH TRESIBA   LOKELMA 10 g PACK packet TAKE 1 PACKET DAILY X2 DAYS, THEN START TAKING EVERY OTHER DAY   meclizine (ANTIVERT) 25 MG tablet TAKE 1 TABLET (25 MG TOTAL) BY MOUTH 3 (THREE) TIMES DAILY AS NEEDED FOR DIZZINESS.   methimazole (TAPAZOLE) 5 MG tablet TAKE 1 TABLET BY MOUTH EVERY DAY   olmesartan (BENICAR) 40 MG tablet Take 1 tablet (40 mg total) by mouth daily.   OneTouch Delica Lancets 75T MISC Use as directed to check blood sugars 2 times per day dx: e11.22   ONETOUCH ULTRA test strip USE AS INSTRUCTED TO CHECK BLOOD SUGARS 2 TIMES PER DAY  DX: E11.22   propranolol ER (INDERAL LA) 120 MG 24 hr capsule TAKE 1 CAPSULE BY MOUTH EVERY DAY   Semaglutide, 1 MG/DOSE, 4 MG/3ML SOPN Inject 1 mg into the skin once a week.   sodium bicarbonate 650 MG tablet TAKE 1 TABLET BY MOUTH EVERY DAY   triamcinolone cream (KENALOG) 0.1 % Apply 1 application topically 2 (two) times daily. Apply   No facility-administered encounter medications on file as of 01/08/2021.  Recent Relevant Labs: Lab Results  Component Value Date/Time   HGBA1C 7.3 (H) 12/26/2020 02:32 PM   HGBA1C 7.3 (H) 09/13/2020 09:48 AM   MICROALBUR 150 06/18/2020 12:27 PM   MICROALBUR 80 06/16/2019 02:39 PM    Kidney Function Lab Results  Component Value Date/Time   CREATININE 1.56 (H) 06/18/2020 12:10 PM   CREATININE 1.39 (H) 03/22/2020 11:12 AM   CREATININE 1.00 (H) 07/02/2015 07:10 PM   CREATININE 1.00 07/17/2013 01:49 PM   GFR 39.42 (L) 12/25/2017 03:08 PM   GFRNONAA 38 (L) 03/22/2020 11:12 AM   GFRNONAA 58 (L) 07/02/2015 07:10 PM   GFRAA 43 (L) 03/22/2020 11:12 AM   GFRAA 67 07/02/2015 07:10 PM    Current antihyperglycemic regimen:  Ozempic 1 mg weekly Tresiba 100 unit/ml - inject 16 units every night   What recent interventions/DTPs have been made to improve glycemic control:  Educated on Exercise  goal of 150 minutes per week Prevention and management of hypoglycemic episodes Counseled to check feet daily and get yearly eye exams  Have there been any recent hospitalizations or ED visits since last visit with CPP? No  Patient denies hypoglycemic symptoms  Patient denies hyperglycemic symptoms  How often are you checking your blood sugar? once daily  What are your blood sugars ranging?  Fasting: 116, 125, 106 Before meals: None After meals: None Bedtime: None  During the week, how often does your blood glucose drop below 70? Never  Are you checking your feet daily/regularly? Daily  Adherence Review: Is the patient currently on a STATIN medication?  Yes Is the patient currently on ACE/ARB medication? Yes Does the patient have >5 day gap between last estimated fill dates? No   Care Gaps: Shingrix overdue PNA Vac overdue Covid booster overdue AWV 04-03-2021  Star Rating Drugs: Ozempic 1 mg- Patient assistance Atorvastatin 20 mg- Last filled 10-23-2020 [facility] Olmesartan 20 mg- Last filled 10-10-2020 [facility]  [place] Clinical Pharmacist Assistant [phone]

## 2021-01-16 ENCOUNTER — Telehealth: Payer: Self-pay

## 2021-01-16 NOTE — Chronic Care Management (AMB) (Signed)
    Chronic Care Management Pharmacy Assistant   Name: Teresa Stanley  MRN: [id number] DOB: Jan 07, 1946   Reason for Encounter: PAP   Medications: Outpatient Encounter Medications as of 01/16/2021  Medication Sig   amLODipine (NORVASC) 10 MG tablet TAKE 1 TABLET BY MOUTH EVERY DAY IN THE EVENING   aspirin 81 MG tablet Take 81 mg by mouth daily.   atorvastatin (LIPITOR) 20 MG tablet TAKE 1 TABLET BY MOUTH EVERY DAY   Blood Glucose Monitoring Suppl (ONE TOUCH ULTRA 2) w/Device KIT Use as directed to check blood sugars 2 times per day dx:e11.22   carboxymethylcellulose (REFRESH PLUS) 0.5 % SOLN Place 1 drop into both eyes daily.   chlorthalidone (HYGROTON) 25 MG tablet Take 25 mg by mouth daily.   cholecalciferol (VITAMIN D3) 25 MCG (1000 UNIT) tablet Take 1,000 Units by mouth daily.   famotidine (PEPCID) 20 MG tablet Take 20 mg by mouth daily.   gabapentin (NEURONTIN) 300 MG capsule Take 1 capsule (300 mg total) by mouth at bedtime.   hydrALAZINE (APRESOLINE) 25 MG tablet TAKE 1 TABLET BY MOUTH TWICE A DAY   insulin degludec (TRESIBA FLEXTOUCH) 100 UNIT/ML SOPN FlexTouch Pen Inject 16 Units into the skin at bedtime.    Insulin Pen Needle 32G X 4 MM MISC USE WITH TRESIBA   LOKELMA 10 g PACK packet TAKE 1 PACKET DAILY X2 DAYS, THEN START TAKING EVERY OTHER DAY   meclizine (ANTIVERT) 25 MG tablet TAKE 1 TABLET (25 MG TOTAL) BY MOUTH 3 (THREE) TIMES DAILY AS NEEDED FOR DIZZINESS.   methimazole (TAPAZOLE) 5 MG tablet TAKE 1 TABLET BY MOUTH EVERY DAY   olmesartan (BENICAR) 40 MG tablet Take 1 tablet (40 mg total) by mouth daily.   OneTouch Delica Lancets 60R MISC Use as directed to check blood sugars 2 times per day dx: e11.22   ONETOUCH ULTRA test strip USE AS INSTRUCTED TO CHECK BLOOD SUGARS 2 TIMES PER DAY DX: E11.22   propranolol ER (INDERAL LA) 120 MG 24 hr capsule TAKE 1 CAPSULE BY MOUTH EVERY DAY   Semaglutide, 1 MG/DOSE, 4 MG/3ML SOPN Inject 1 mg into the skin once a week.   sodium  bicarbonate 650 MG tablet TAKE 1 TABLET BY MOUTH EVERY DAY   triamcinolone cream (KENALOG) 0.1 % Apply 1 application topically 2 (two) times daily. Apply   No facility-administered encounter medications on file as of 01/16/2021.    01-16-2021: Informed patient that her signature is missing on her application. Patient stated she would like application mailed back out to her.  [place] Pharmacist Assistant [phone]

## 2021-01-21 ENCOUNTER — Other Ambulatory Visit: Payer: Self-pay | Admitting: Internal Medicine

## 2021-02-10 ENCOUNTER — Other Ambulatory Visit: Payer: Self-pay | Admitting: Internal Medicine

## 2021-02-27 ENCOUNTER — Telehealth: Payer: Self-pay

## 2021-02-27 NOTE — Progress Notes (Signed)
error 

## 2021-02-27 NOTE — Chronic Care Management (AMB) (Deleted)
Chronic Care Management Pharmacy Assistant   Name: Teresa Stanley  MRN: [id number] DOB: 07/02/1945  Reason for Encounter: Disease State/ Diabetes  Recent office visits:  None  Recent consult visits:  None  Hospital visits:  None in previous 6 months  Medications: Outpatient Encounter Medications as of 02/27/2021  Medication Sig   amLODipine (NORVASC) 10 MG tablet TAKE 1 TABLET BY MOUTH EVERY DAY IN THE EVENING   aspirin 81 MG tablet Take 81 mg by mouth daily.   atorvastatin (LIPITOR) 20 MG tablet TAKE 1 TABLET BY MOUTH EVERY DAY   Blood Glucose Monitoring Suppl (ONE TOUCH ULTRA 2) w/Device KIT Use as directed to check blood sugars 2 times per day dx:e11.22   carboxymethylcellulose (REFRESH PLUS) 0.5 % SOLN Place 1 drop into both eyes daily.   chlorthalidone (HYGROTON) 25 MG tablet Take 25 mg by mouth daily.   cholecalciferol (VITAMIN D3) 25 MCG (1000 UNIT) tablet Take 1,000 Units by mouth daily.   famotidine (PEPCID) 20 MG tablet Take 20 mg by mouth daily.   gabapentin (NEURONTIN) 300 MG capsule Take 1 capsule (300 mg total) by mouth at bedtime.   hydrALAZINE (APRESOLINE) 25 MG tablet TAKE 1 TABLET BY MOUTH TWICE A DAY   insulin degludec (TRESIBA FLEXTOUCH) 100 UNIT/ML SOPN FlexTouch Pen Inject 16 Units into the skin at bedtime.    Insulin Pen Needle 32G X 4 MM MISC USE WITH TRESIBA   LOKELMA 10 g PACK packet TAKE 1 PACKET DAILY X2 DAYS, THEN START TAKING EVERY OTHER DAY   meclizine (ANTIVERT) 25 MG tablet TAKE 1 TABLET (25 MG TOTAL) BY MOUTH 3 (THREE) TIMES DAILY AS NEEDED FOR DIZZINESS.   methimazole (TAPAZOLE) 5 MG tablet TAKE 1 TABLET BY MOUTH EVERY DAY   olmesartan (BENICAR) 40 MG tablet Take 1 tablet (40 mg total) by mouth daily.   OneTouch Delica Lancets 09O MISC Use as directed to check blood sugars 2 times per day dx: e11.22   ONETOUCH ULTRA test strip USE AS INSTRUCTED TO CHECK BLOOD SUGARS 2 TIMES PER DAY DX: E11.22   propranolol ER (INDERAL LA) 120 MG 24 hr capsule  TAKE 1 CAPSULE BY MOUTH EVERY DAY   Semaglutide, 1 MG/DOSE, 4 MG/3ML SOPN Inject 1 mg into the skin once a week.   sodium bicarbonate 650 MG tablet TAKE 1 TABLET BY MOUTH EVERY DAY   triamcinolone cream (KENALOG) 0.1 % Apply 1 application topically 2 (two) times daily. Apply   No facility-administered encounter medications on file as of 02/27/2021.  Recent Relevant Labs: Lab Results  Component Value Date/Time   HGBA1C 7.3 (H) 12/26/2020 02:32 PM   HGBA1C 7.3 (H) 09/13/2020 09:48 AM   MICROALBUR 150 06/18/2020 12:27 PM   MICROALBUR 80 06/16/2019 02:39 PM    Kidney Function Lab Results  Component Value Date/Time   CREATININE 1.56 (H) 06/18/2020 12:10 PM   CREATININE 1.39 (H) 03/22/2020 11:12 AM   CREATININE 1.00 (H) 07/02/2015 07:10 PM   CREATININE 1.00 07/17/2013 01:49 PM   GFR 39.42 (L) 12/25/2017 03:08 PM   GFRNONAA 38 (L) 03/22/2020 11:12 AM   GFRNONAA 58 (L) 07/02/2015 07:10 PM   GFRAA 43 (L) 03/22/2020 11:12 AM   GFRAA 67 07/02/2015 07:10 PM    Current antihyperglycemic regimen:   What recent interventions/DTPs have been made to improve glycemic control:  *** Have there been any recent hospitalizations or ED visits since last visit with CPP? {yes/no:20286} Patient {reports/denies:24182} hypoglycemic symptoms, including {Hypoglycemic Symptoms:3049003} Patient {reports/denies:24182} hyperglycemic  symptoms, including {symptoms; hyperglycemia:17903} How often are you checking your blood sugar? {BG Testing frequency:23922} What are your blood sugars ranging?  Fasting: *** Before meals: *** After meals: *** Bedtime: *** During the week, how often does your blood glucose drop below 70? {LowBGfrequency:24142} Are you checking your feet daily/regularly?   Adherence Review: Is the patient currently on a STATIN medication? Yes Is the patient currently on ACE/ARB medication? Yes Does the patient have >5 day gap between last estimated fill dates? No  Care Gaps: Shingrix  overdue PNA Vac overdue Covid booster overdue AWV 04-03-2021  Star Rating Drugs: Ozempic 1 mg- Patient assistance Atorvastatin 20 mg- Last filled 01-21-2021 [facility] Olmesartan 20 mg- Last filled 02-01-2021 [facility]  [place] Clinical Pharmacist Assistant [phone]

## 2021-02-27 NOTE — Chronic Care Management (AMB) (Signed)
° ° °Chronic Care Management °Pharmacy Assistant  ° °Name: Teresa Stanley  MRN: [id number] DOB: 03/02/1945 ° ° °Reason for Encounter: Disease State/ Diabetes ° °Recent office visits:  °None ° °Recent consult visits:  °None ° °Hospital visits:  °None in previous 6 months ° °Medications: °Outpatient Encounter Medications as of 02/27/2021  °Medication Sig  ° amLODipine (NORVASC) 10 MG tablet TAKE 1 TABLET BY MOUTH EVERY DAY IN THE EVENING  ° aspirin 81 MG tablet Take 81 mg by mouth daily.  ° atorvastatin (LIPITOR) 20 MG tablet TAKE 1 TABLET BY MOUTH EVERY DAY  ° Blood Glucose Monitoring Suppl (ONE TOUCH ULTRA 2) w/Device KIT Use as directed to check blood sugars 2 times per day dx:e11.22  ° carboxymethylcellulose (REFRESH PLUS) 0.5 % SOLN Place 1 drop into both eyes daily.  ° chlorthalidone (HYGROTON) 25 MG tablet Take 25 mg by mouth daily.  ° cholecalciferol (VITAMIN D3) 25 MCG (1000 UNIT) tablet Take 1,000 Units by mouth daily.  ° famotidine (PEPCID) 20 MG tablet Take 20 mg by mouth daily.  ° gabapentin (NEURONTIN) 300 MG capsule Take 1 capsule (300 mg total) by mouth at bedtime.  ° hydrALAZINE (APRESOLINE) 25 MG tablet TAKE 1 TABLET BY MOUTH TWICE A DAY  ° insulin degludec (TRESIBA FLEXTOUCH) 100 UNIT/ML SOPN FlexTouch Pen Inject 16 Units into the skin at bedtime.   ° Insulin Pen Needle 32G X 4 MM MISC USE WITH TRESIBA  ° LOKELMA 10 g PACK packet TAKE 1 PACKET DAILY X2 DAYS, THEN START TAKING EVERY OTHER DAY  ° meclizine (ANTIVERT) 25 MG tablet TAKE 1 TABLET (25 MG TOTAL) BY MOUTH 3 (THREE) TIMES DAILY AS NEEDED FOR DIZZINESS.  ° methimazole (TAPAZOLE) 5 MG tablet TAKE 1 TABLET BY MOUTH EVERY DAY  ° olmesartan (BENICAR) 40 MG tablet Take 1 tablet (40 mg total) by mouth daily.  ° OneTouch Delica Lancets 33G MISC Use as directed to check blood sugars 2 times per day dx: e11.22  ° ONETOUCH ULTRA test strip USE AS INSTRUCTED TO CHECK BLOOD SUGARS 2 TIMES PER DAY DX: E11.22  ° propranolol ER (INDERAL LA) 120 MG 24 hr  capsule TAKE 1 CAPSULE BY MOUTH EVERY DAY  ° Semaglutide, 1 MG/DOSE, 4 MG/3ML SOPN Inject 1 mg into the skin once a week.  ° sodium bicarbonate 650 MG tablet TAKE 1 TABLET BY MOUTH EVERY DAY  ° triamcinolone cream (KENALOG) 0.1 % Apply 1 application topically 2 (two) times daily. Apply  ° °No facility-administered encounter medications on file as of 02/27/2021.  ° °Recent Relevant Labs: °Lab Results  °Component Value Date/Time  ° HGBA1C 7.3 (H) 12/26/2020 02:32 PM  ° HGBA1C 7.3 (H) 09/13/2020 09:48 AM  ° MICROALBUR 150 06/18/2020 12:27 PM  ° MICROALBUR 80 06/16/2019 02:39 PM  °  °Kidney Function °Lab Results  °Component Value Date/Time  ° CREATININE 1.56 (H) 06/18/2020 12:10 PM  ° CREATININE 1.39 (H) 03/22/2020 11:12 AM  ° CREATININE 1.00 (H) 07/02/2015 07:10 PM  ° CREATININE 1.00 07/17/2013 01:49 PM  ° GFR 39.42 (L) 12/25/2017 03:08 PM  ° GFRNONAA 38 (L) 03/22/2020 11:12 AM  ° GFRNONAA 58 (L) 07/02/2015 07:10 PM  ° GFRAA 43 (L) 03/22/2020 11:12 AM  ° GFRAA 67 07/02/2015 07:10 PM  ° ° °Current antihyperglycemic regimen:  °Ozempic 1 mg weekly °Tresiba 100 unit/ml - inject 16 units nightly. ° °What recent interventions/DTPs have been made to improve glycemic control:  °Educated on Exercise goal of 150 minutes per week °Prevention and management   management of hypoglycemic episodes Counseled to check feet daily and get yearly eye exams  Have there been any recent hospitalizations or ED visits since last visit with CPP? No  Patient denies hypoglycemic symptoms  Patient denies hyperglycemic symptoms  How often are you checking your blood sugar? once daily  What are your blood sugars ranging?  Fasting: 106, 112, 116, 127 Before meals: None After meals: None Bedtime: None  During the week, how often does your blood glucose drop below 70? Never  Are you checking your feet daily/regularly? Patient stated daily  Adherence Review: Is the patient currently on a STATIN medication? Yes Is the patient currently on  ACE/ARB medication? Yes Does the patient have >5 day gap between last estimated fill dates? No  NOTES: Patient stated she received approval letter for Ozempic and medication will be shipped out in 2 weeks. Informed patient that [facility] is experiencing supply shortage and to call them to track shipment in a week.  Care Gaps: Shingrix overdue PNA Vac overdue Covid booster overdue AWV 04-03-2021  Star Rating Drugs: Ozempic 1 mg- Patient assistance Atorvastatin 20 mg- Last filled 01-21-2021 [facility] Olmesartan 20 mg- Last filled 02-01-2021 [facility]  [place] Clinical Pharmacist Assistant [phone]

## 2021-03-11 ENCOUNTER — Telehealth: Payer: Self-pay | Admitting: Internal Medicine

## 2021-03-11 ENCOUNTER — Ambulatory Visit (HOSPITAL_COMMUNITY)
Admission: EM | Admit: 2021-03-11 | Discharge: 2021-03-11 | Disposition: A | Discharge status: Home / Self Care | Payer: Medicare Other | Attending: Sports Medicine | Admitting: Sports Medicine

## 2021-03-11 ENCOUNTER — Other Ambulatory Visit: Payer: Self-pay

## 2021-03-11 ENCOUNTER — Encounter (HOSPITAL_COMMUNITY): Payer: Self-pay | Admitting: *Deleted

## 2021-03-11 DIAGNOSIS — S61211A Laceration without foreign body of left index finger without damage to nail, initial encounter: Secondary | ICD-10-CM

## 2021-03-11 DIAGNOSIS — E1159 Type 2 diabetes mellitus with other circulatory complications: Secondary | ICD-10-CM | POA: Diagnosis not present

## 2021-03-11 DIAGNOSIS — I10 Essential (primary) hypertension: Secondary | ICD-10-CM | POA: Diagnosis not present

## 2021-03-11 DIAGNOSIS — E1169 Type 2 diabetes mellitus with other specified complication: Secondary | ICD-10-CM

## 2021-03-11 MED ORDER — LIDOCAINE HCL (PF) 1 % IJ SOLN
INTRAMUSCULAR | Status: AC
  Filled 2021-03-11: qty 30

## 2021-03-11 MED ORDER — LIDOCAINE HCL (PF) 1 % IJ SOLN
5.0000 mL | Freq: Once | INTRAMUSCULAR | Status: AC
Start: 2021-03-11 — End: 2021-03-11
  Administered 2021-03-11: 5 mL via INTRADERMAL

## 2021-03-11 NOTE — ED Triage Notes (Signed)
Pt reports cutting her Lt index finger on a can of soup today.

## 2021-03-11 NOTE — Discharge Instructions (Addendum)
Keep the wound clean, you can keep a band-aid over top for protection I will put antibiotic ointment on it today, but you do not need to do this You may get it wet, but do not bathe the finger and water  You will return to the urgent care in approximately 10 days for reevaluation to have the sutures removed If there is any increasing redness, warmth or you start developing any fever or chills prior to this time, you should present sooner to the urgent care/ED to rule out infection

## 2021-03-11 NOTE — ED Provider Notes (Signed)
[facility]    CSN: [id number] Arrival date & time: 03/11/21  1651      History   Chief Complaint Chief Complaint  Patient presents with   Extremity Laceration    LT index finger    HPI Teresa Stanley is a 76 y.o. female who presents for left index finger laceration.  HPI  Patient does present with her daughter who helps provide some of HPI.  Earlier today around 1530-1600, patient was opening up soups of candy when she sliced her left index finger against a can of soup.  She had significant bleeding at that time but applied pressure and with the help of her daughter was able to achieve hemostasis after 30 to 45 minutes.  She has noted an open cut on the volar aspect of her index finger, which her daughter states need to be checked out for stitches.  She does have pain of that finger, although denies any nail involvement.  She denies any foreign body that may have gone into the finger.  She has not washed the finger.  She is unsure of her last tetanus shot.  Denies any previous injury to this finger.  Patient is a known type II diabetic. Her last A1c was  Lab Results  Component Value Date   HGBA1C 7.3 (H) 12/26/2020    Upon chart review, noted to have completed last Tdap vaccine on 08/11/2017.   Past Medical History:  Diagnosis Date   Arthritis    Diabetes mellitus    takes Actos and Metformin daily and Victoza   GERD (gastroesophageal reflux disease)    takes Omeprazole daily   Headache(784.0)    occasionally   Hyperlipidemia    takes Atorvastatin daily   Hypertension    takes Lisinopril,Amlodipine,and Metoprolol daily   Hypothyroidism    Joint pain    Thyroid disease    ?    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   Unilateral primary osteoarthritis, right knee 04/04/2013    Class: Diagnosis of   Candidal intertrigo 08/09/2012   Hypertension 12/15/2011   Diabetes mellitus with stage 3 chronic kidney disease ([place]) 12/15/2011    Past Surgical History:  Procedure Laterality Date   ABDOMINAL HYSTERECTOMY     BREAST BIOPSY Left    BREAST EXCISIONAL BIOPSY Left    CARDIAC CATHETERIZATION  2009   CATARACT EXTRACTION Right    EYE SURGERY     JOINT REPLACEMENT     KNEE ARTHROPLASTY Left 04/04/2013   Procedure: COMPUTER ASSISTED TOTAL KNEE ARTHROPLASTY;  Surgeon: Marybelle Killings, MD;  Location: [place];  Service: Orthopedics;  Laterality: Left;  Left Total Knee Arthroplasty, Cemented   TOTAL KNEE ARTHROPLASTY Right 11/26/2015   Procedure: TOTAL KNEE ARTHROPLASTY;  Surgeon: Marybelle Killings, MD;  Location: [place];  Service: Orthopedics;  Laterality: Right;    OB History   No obstetric history on file.      Home Medications    Prior to Admission medications   Medication Sig Start Date End Date Taking? Authorizing Provider  amLODipine (NORVASC) 10 MG tablet TAKE 1 TABLET BY MOUTH EVERY DAY IN THE EVENING 01/01/21   Bary Castilla, NP  aspirin 81 MG tablet Take 81  mg by mouth daily.    [provider]  atorvastatin (LIPITOR) 20 MG tablet TAKE 1 TABLET BY MOUTH EVERY DAY 01/21/21   [name], MD  Blood Glucose Monitoring Suppl (ONE TOUCH ULTRA 2) w/Device KIT Use as directed to check blood sugars 2 times per day dx:e11.22 06/08/18   [name], MD  carboxymethylcellulose (REFRESH PLUS) 0.5 % SOLN Place 1 drop into both eyes daily.    [provider]  chlorthalidone (HYGROTON) 25 MG tablet Take 25 mg by mouth daily. 10/11/19   [provider]  cholecalciferol (VITAMIN D3) 25 MCG (1000 UNIT) tablet Take 1,000 Units by mouth daily.    [provider]  famotidine (PEPCID) 20 MG tablet Take 20 mg by mouth daily. 10/11/19   [provider]   gabapentin (NEURONTIN) 300 MG capsule Take 1 capsule (300 mg total) by mouth at bedtime. 09/13/20 09/13/21  [name], MD  hydrALAZINE (APRESOLINE) 25 MG tablet TAKE 1 TABLET BY MOUTH TWICE A DAY 11/23/20   [name], MD  insulin degludec (TRESIBA FLEXTOUCH) 100 UNIT/ML SOPN FlexTouch Pen Inject 16 Units into the skin at bedtime.     [provider]  Insulin Pen Needle 32G X 4 MM MISC USE WITH TRESIBA 06/25/20   [name], MD  LOKELMA 10 g PACK packet TAKE 1 PACKET DAILY X2 DAYS, THEN START TAKING EVERY OTHER DAY 04/30/20   [provider]  meclizine (ANTIVERT) 25 MG tablet TAKE 1 TABLET (25 MG TOTAL) BY MOUTH 3 (THREE) TIMES DAILY AS NEEDED FOR DIZZINESS. 02/22/20   [name], MD  methimazole (TAPAZOLE) 5 MG tablet TAKE 1 TABLET BY MOUTH EVERY DAY 01/01/21   Ghumman, Ramandeep, NP  olmesartan (BENICAR) 40 MG tablet Take 1 tablet (40 mg total) by mouth daily. 10/09/20 10/09/21  [name], MD  OneTouch Delica Lancets 45O MISC Use as directed to check blood sugars 2 times per day dx: e11.22 06/25/20   [name], MD  [facility] [place] ULTRA test strip USE AS INSTRUCTED TO CHECK BLOOD SUGARS 2 TIMES PER DAY DX: E11.22 02/11/21   [name], MD  propranolol ER (INDERAL LA) 120 MG 24 hr capsule TAKE 1 CAPSULE BY MOUTH EVERY DAY 01/01/21   Ghumman, Ramandeep, NP  Semaglutide, 1 MG/DOSE, 4 MG/3ML SOPN Inject 1 mg into the skin once a week. 06/18/20   [name], MD  sodium bicarbonate 650 MG tablet TAKE 1 TABLET BY MOUTH EVERY DAY 10/01/20   [name], MD  triamcinolone cream (KENALOG) 0.1 % Apply 1 application topically 2 (two) times daily. Apply 08/09/19   [name], MD    Family History Family History  Problem Relation Age of Onset   Diabetes Mother    Hypertension Mother    Diabetes Father    Asthma Father    Hypertension Father    Hypertension Daughter    Hypertension Sister        2   Diabetes Sister    Hypertension Brother        5   Kidney  disease Brother    Colon cancer Neg Hx    Esophageal cancer Neg Hx    Rectal cancer Neg Hx    Stomach cancer Neg Hx     Social History Social History   Tobacco Use   Smoking status: Never   Smokeless tobacco: Never  Vaping Use   Vaping Use: Never used  Substance Use Topics   Alcohol use: No   Drug use: No  Allergies   Patient has no known allergies.   Review of Systems Review of Systems  Constitutional:  Negative for chills and fever.  Musculoskeletal:        + left finger pain  Skin:  Positive for wound (left index finger).  Neurological:  Negative for dizziness and weakness.  Hematological:  Does not bruise/bleed easily.    Physical Exam Triage Vital Signs ED Triage Vitals  Enc Vitals Group     BP 03/11/21 1808 (!) 167/82     Pulse Rate 03/11/21 1808 79     Resp 03/11/21 1808 20     Temp 03/11/21 1808 97.8 F (36.6 C)     Temp src --      SpO2 03/11/21 1808 99 %     Weight --      Height --      Head Circumference --      Peak Flow --      Pain Score 03/11/21 1806 5     Pain Loc --      Pain Edu? --      Excl. in [place]? --    No data found.  Updated Vital Signs BP (!) 167/82    Pulse 79    Temp 97.8 F (36.6 C)    Resp 20    SpO2 99%   Physical Exam Constitutional:      General: She is not in acute distress.    Appearance: Normal appearance. She is not toxic-appearing.  HENT:     Head: Normocephalic and atraumatic.  Eyes:     Conjunctiva/sclera: Conjunctivae normal.     Pupils: Pupils are equal, round, and reactive to light.  Cardiovascular:     Rate and Rhythm: Normal rate.  Pulmonary:     Effort: Pulmonary effort is normal.  Musculoskeletal:        General: Signs of injury (left index finger laceration, volar surface) present.     Cervical back: Normal range of motion.  Skin:    Capillary Refill: Capillary refill takes less than 2 seconds.     Findings: Laceration present.          Comments: 1-inch vertical laceration that bends  horizontal at distal tip of finger approx 0.5 inches  Neurological:     Mental Status: She is alert.     UC Treatments / Results  Labs (all labs ordered are listed, but only abnormal results are displayed) Labs Reviewed - No data to display  EKG   Radiology No results found.  Procedures Laceration Repair  Date/Time: 03/11/2021 9:26 PM Performed by: Elba Barman, DO Authorized by: Elba Barman, DO   Consent:    Consent obtained:  Verbal   Consent given by:  Patient   Risks, benefits, and alternatives were discussed: yes     Risks discussed:  Infection, pain and poor cosmetic result   Alternatives discussed:  No treatment Universal protocol:    Procedure explained and questions answered to patient or proxy's satisfaction: yes     Site/side marked: yes     Patient identity confirmed:  Verbally with patient Anesthesia:    Anesthesia method:  Nerve block   Block location:  Digital nerve block, first finger   Block needle gauge:  27 G   Block anesthetic:  Lidocaine 1% w/o epi   Block technique:  Digital nerve block   Block outcome:  Anesthesia achieved Laceration details:    Location:  Finger   Finger location:  L index finger  Length (cm):  2.5 Pre-procedure details:    Preparation:  Patient was prepped and draped in usual sterile fashion Exploration:    Hemostasis achieved with:  Direct pressure   Wound exploration: entire depth of wound visualized     Contaminated: no   Treatment:    Area cleansed with:  Povidone-iodine and saline   Amount of cleaning:  Extensive   Irrigation solution:  Sterile saline   Irrigation volume:  40cc (10cc x 4)   Irrigation method:  Syringe   Visualized foreign bodies/material removed: no     Layers/structures repaired:  Deep subcutaneous Deep subcutaneous:    Suture size:  4-0   Suture technique:  Simple interrupted   Number of sutures:  6 Skin repair:    Repair method:  Sutures   Suture size:  4-0   Suture material:  Nylon    Suture technique:  Simple interrupted   Number of sutures:  6 Approximation:    Approximation:  Close Repair type:    Repair type:  Intermediate Post-procedure details:    Dressing:  Antibiotic ointment and adhesive bandage   Procedure completion:  Tolerated well, no immediate complications (including critical care time)  Medications Ordered in UC Medications  lidocaine (PF) (XYLOCAINE) 1 % injection 5 mL (5 mLs Intradermal Given by Other 03/11/21 1850)    Initial Impression / Assessment and Plan / UC Course  I have reviewed the triage vital signs and the nursing notes.  Pertinent labs & imaging results that were available during my care of the patient were reviewed by me and considered in my medical decision making (see chart for details).     Left index finger laceration Elevated BP with diagnosis of HTN Type II DM  Patient with left index finger laceration extending 1 inch vertically and curving (cane-shaped) with approx 0.5" horizontal laceration on distal tip of index finger.  Fortunately, no nailbed interaction was involved.  This area was copiously cleaned with sterile saline flushes, iodine and multiple alcohol swabs.  After digital nerve block, suture repair underwent with simple interrupted sutures, 6 in total with 4-0 nylon with good approximation of the skin.  Antibiotic ointment was placed with a soft gauze wrap over top and Band-Aid for protection.  Wound handout was provided for the patient and her daughter for the next 10 days.  She is to return in 10 days at the urgent care for suture removal.  We did discuss return precautions sooner if any signs or symptoms of infection develop.  She does have a history of type 2 diabetes, although it is well controlled.  We decided not to prophylactically treat with antibiotics, as I do not feel it is necessary at this time.  Her blood pressure was elevated but she was asymptomatic and this is likely due to her pain.  She is to follow-up  with her primary physician to monitor this.  Turn precautions provided.  We will follow-up in 10 days for suture removal, or sooner if any issues arise.  Final Clinical Impressions(s) / UC Diagnoses   Final diagnoses:  Laceration of left index finger without foreign body, nail damage status unspecified, initial encounter     Discharge Instructions      Keep the wound clean, you can keep a band-aid over top for protection I will put antibiotic ointment on it today, but you do not need to do this You may get it wet, but do not bathe the finger and water  You will return  to the urgent care in approximately 10 days for reevaluation to have the sutures removed If there is any increasing redness, warmth or you start developing any fever or chills prior to this time, you should present sooner to the urgent care/ED to rule out infection     ED Prescriptions   None    PDMP not reviewed this encounter.   Elba Barman, DO 03/11/21 2133

## 2021-03-11 NOTE — Telephone Encounter (Signed)
Left message asking pt to call [phone]  [facility] will be out of office.  Please r/s 04/03/21 [facility] appt

## 2021-03-20 ENCOUNTER — Other Ambulatory Visit: Payer: Self-pay

## 2021-03-20 ENCOUNTER — Ambulatory Visit (HOSPITAL_COMMUNITY)
Admission: RE | Admit: 2021-03-20 | Discharge: 2021-03-20 | Disposition: A | Discharge status: Home / Self Care | Payer: Medicare Other | Source: Ambulatory Visit | Attending: Physician Assistant | Admitting: Physician Assistant

## 2021-03-20 ENCOUNTER — Encounter (HOSPITAL_COMMUNITY): Payer: Self-pay

## 2021-03-20 VITALS — BP 166/90 | HR 88 | Temp 98.1°F | Resp 20

## 2021-03-20 DIAGNOSIS — U071 COVID-19: Secondary | ICD-10-CM | POA: Diagnosis not present

## 2021-03-20 DIAGNOSIS — Z4802 Encounter for removal of sutures: Secondary | ICD-10-CM | POA: Insufficient documentation

## 2021-03-20 DIAGNOSIS — J069 Acute upper respiratory infection, unspecified: Secondary | ICD-10-CM | POA: Diagnosis not present

## 2021-03-20 NOTE — ED Triage Notes (Signed)
Pt presents with congestion,Ha,sore throat since Sunday. Pt also needs to have sutures removed located on Lt index finger.

## 2021-03-20 NOTE — ED Provider Notes (Addendum)
[place]    CSN: [id number] Arrival date & time: 03/20/21  1752      History   Chief Complaint Chief Complaint  Patient presents with   Sore Throat   Nasal Congestion   Headache   Suture / Staple Removal    HPI Teresa Stanley is a 76 y.o. female.   Patient here today for suture removal. She had 6 sutures placed about a week ago after laceration to left index finger she sustained from soup can. She denies any concerns with finger.   She reports that over the last several days she has developed congestion and cough. She has not had fever. She does report some sore throat.   The history is provided by the patient.  Sore Throat Associated symptoms include headaches. Pertinent negatives include no abdominal pain and no shortness of breath.  Headache Associated symptoms: congestion, cough and sore throat   Associated symptoms: no abdominal pain, no diarrhea, no ear pain, no fever, no nausea and no vomiting   Suture / Staple Removal Associated symptoms include headaches. Pertinent negatives include no abdominal pain and no shortness of breath.   Past Medical History:  Diagnosis Date   Arthritis    Diabetes mellitus    takes Actos and Metformin daily and Victoza   GERD (gastroesophageal reflux disease)    takes Omeprazole daily   Headache(784.0)    occasionally   Hyperlipidemia    takes Atorvastatin daily   Hypertension    takes Lisinopril,Amlodipine,and Metoprolol daily   Hypothyroidism    Joint pain    Thyroid disease    ?    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   S/P total knee arthroplasty 04/18/2013   Unilateral primary osteoarthritis, right knee 04/04/2013    Class: Diagnosis of   Candidal intertrigo 08/09/2012   Hypertension  12/15/2011   Diabetes mellitus with stage 3 chronic kidney disease ([place]) 12/15/2011    Past Surgical History:  Procedure Laterality Date   ABDOMINAL HYSTERECTOMY     BREAST BIOPSY Left    BREAST EXCISIONAL BIOPSY Left    CARDIAC CATHETERIZATION  2009   CATARACT EXTRACTION Right    EYE SURGERY     JOINT REPLACEMENT     KNEE ARTHROPLASTY Left 04/04/2013   Procedure: COMPUTER ASSISTED TOTAL KNEE ARTHROPLASTY;  Surgeon: Marybelle Killings, MD;  Location: [place];  Service: Orthopedics;  Laterality: Left;  Left Total Knee Arthroplasty, Cemented   TOTAL KNEE ARTHROPLASTY Right 11/26/2015   Procedure: TOTAL KNEE ARTHROPLASTY;  Surgeon: Marybelle Killings, MD;  Location: [place];  Service: Orthopedics;  Laterality: Right;    OB History   No obstetric history on file.      Home Medications    Prior to Admission medications   Medication Sig Start Date End Date Taking? Authorizing Provider  amLODipine (NORVASC) 10 MG tablet TAKE 1 TABLET BY MOUTH EVERY DAY IN THE EVENING 01/01/21   Bary Castilla, NP  aspirin 81 MG tablet Take 81 mg by mouth daily.    [provider]  atorvastatin (LIPITOR) 20 MG tablet TAKE 1 TABLET BY MOUTH EVERY DAY 01/21/21   [name], MD  Blood Glucose Monitoring Suppl (ONE TOUCH ULTRA 2) w/Device KIT Use as directed to check blood  sugars 2 times per day dx:e11.22 06/08/18   [name], MD  carboxymethylcellulose (REFRESH PLUS) 0.5 % SOLN Place 1 drop into both eyes daily.    [provider]  chlorthalidone (HYGROTON) 25 MG tablet Take 25 mg by mouth daily. 10/11/19   [provider]  cholecalciferol (VITAMIN D3) 25 MCG (1000 UNIT) tablet Take 1,000 Units by mouth daily.    [provider]  famotidine (PEPCID) 20 MG tablet Take 20 mg by mouth daily. 10/11/19   [provider]  gabapentin (NEURONTIN) 300 MG capsule Take 1 capsule (300 mg total) by mouth at bedtime. 09/13/20 09/13/21  [name], MD  hydrALAZINE (APRESOLINE) 25 MG  tablet TAKE 1 TABLET BY MOUTH TWICE A DAY 11/23/20   [name], MD  insulin degludec (TRESIBA FLEXTOUCH) 100 UNIT/ML SOPN FlexTouch Pen Inject 16 Units into the skin at bedtime.     [provider]  Insulin Pen Needle 32G X 4 MM MISC USE WITH TRESIBA 06/25/20   [name], MD  LOKELMA 10 g PACK packet TAKE 1 PACKET DAILY X2 DAYS, THEN START TAKING EVERY OTHER DAY 04/30/20   [provider]  meclizine (ANTIVERT) 25 MG tablet TAKE 1 TABLET (25 MG TOTAL) BY MOUTH 3 (THREE) TIMES DAILY AS NEEDED FOR DIZZINESS. 02/22/20   [name], MD  methimazole (TAPAZOLE) 5 MG tablet TAKE 1 TABLET BY MOUTH EVERY DAY 01/01/21   Ghumman, Ramandeep, NP  olmesartan (BENICAR) 40 MG tablet Take 1 tablet (40 mg total) by mouth daily. 10/09/20 10/09/21  [name], MD  OneTouch Delica Lancets 68T MISC Use as directed to check blood sugars 2 times per day dx: e11.22 06/25/20   [name], MD  [facility] ULTRA test strip USE AS INSTRUCTED TO CHECK BLOOD SUGARS 2 TIMES PER DAY DX: E11.22 02/11/21   [name], MD  propranolol ER (INDERAL LA) 120 MG 24 hr capsule TAKE 1 CAPSULE BY MOUTH EVERY DAY 01/01/21   Ghumman, Ramandeep, NP  Semaglutide, 1 MG/DOSE, 4 MG/3ML SOPN Inject 1 mg into the skin once a week. 06/18/20   [name], MD  sodium bicarbonate 650 MG tablet TAKE 1 TABLET BY MOUTH EVERY DAY 10/01/20   [name], MD  triamcinolone cream (KENALOG) 0.1 % Apply 1 application topically 2 (two) times daily. Apply 08/09/19   [name], MD    Family History Family History  Problem Relation Age of Onset   Diabetes Mother    Hypertension Mother    Diabetes Father    Asthma Father    Hypertension Father    Hypertension Daughter    Hypertension Sister        2   Diabetes Sister    Hypertension Brother        5   Kidney disease Brother    Colon cancer Neg Hx    Esophageal cancer Neg Hx    Rectal cancer Neg Hx    Stomach cancer Neg Hx     Social History Social History    Tobacco Use   Smoking status: Never   Smokeless tobacco: Never  Vaping Use   Vaping Use: Never used  Substance Use Topics   Alcohol use: No   Drug use: No     Allergies   Patient has no known allergies.   Review of Systems Review of Systems  Constitutional:  Negative for chills and fever.  HENT:  Positive for congestion and sore throat. Negative for ear pain.   Eyes:  Negative for discharge  and redness.  Respiratory:  Positive for cough. Negative for shortness of breath and wheezing.   Gastrointestinal:  Negative for abdominal pain, diarrhea, nausea and vomiting.  Neurological:  Positive for headaches.    Physical Exam Triage Vital Signs ED Triage Vitals  Enc Vitals Group     BP 03/20/21 1837 (!) 166/90     Pulse Rate 03/20/21 1837 88     Resp 03/20/21 1837 20     Temp 03/20/21 1837 98.1 F (36.7 C)     Temp src --      SpO2 03/20/21 1837 92 %     Weight --      Height --      Head Circumference --      Peak Flow --      Pain Score 03/20/21 1835 0     Pain Loc --      Pain Edu? --      Excl. in [place]? --    No data found.  Updated Vital Signs BP (!) 166/90    Pulse 88    Temp 98.1 F (36.7 C)    Resp 20    SpO2 92%      Physical Exam Vitals and nursing note reviewed.  Constitutional:      General: She is not in acute distress.    Appearance: Normal appearance. She is not ill-appearing.  HENT:     Head: Normocephalic and atraumatic.     Nose: Congestion present.     Mouth/Throat:     Mouth: Mucous membranes are moist.     Pharynx: No oropharyngeal exudate or posterior oropharyngeal erythema.  Eyes:     Conjunctiva/sclera: Conjunctivae normal.  Cardiovascular:     Rate and Rhythm: Normal rate and regular rhythm.     Heart sounds: Normal heart sounds. No murmur heard. Pulmonary:     Effort: Pulmonary effort is normal. No respiratory distress.     Breath sounds: Normal breath sounds. No wheezing, rhonchi or rales.  Skin:    General: Skin is warm  and dry.     Comments: 5 sutures noted to left index finger- most proximal suture appears to be missing but scarring to finger where suture was placed is present, no apparent bleeding, erythema or swelling, sutures removed without complication.   Neurological:     Mental Status: She is alert.  Psychiatric:        Mood and Affect: Mood normal.        Thought Content: Thought content normal.     UC Treatments / Results  Labs (all labs ordered are listed, but only abnormal results are displayed) Labs Reviewed  SARS CORONAVIRUS 2 (TAT 6-24 HRS)    EKG   Radiology No results found.  Procedures Procedures (including critical care time)  Medications Ordered in UC Medications - No data to display  Initial Impression / Assessment and Plan / UC Course  I have reviewed the triage vital signs and the nursing notes.  Pertinent labs & imaging results that were available during my care of the patient were reviewed by me and considered in my medical decision making (see chart for details).    Suspect viral etiology of upper respiratory symptoms. Will screen for covid and recommend symptomatic treatment if needed. Encourage follow up with any further concerns.   Final Clinical Impressions(s) / UC Diagnoses   Final diagnoses:  Viral upper respiratory tract infection  Visit for suture removal   Discharge Instructions   None  ED Prescriptions   None    PDMP not reviewed this encounter.   Francene Finders, PA-C 03/20/21 1936    Francene Finders, PA-C 03/25/21 [phone]

## 2021-03-21 ENCOUNTER — Ambulatory Visit (INDEPENDENT_AMBULATORY_CARE_PROVIDER_SITE_OTHER): Payer: Medicare Other

## 2021-03-21 ENCOUNTER — Other Ambulatory Visit: Payer: Self-pay

## 2021-03-21 VITALS — Ht 62.0 in | Wt 269.0 lb

## 2021-03-21 DIAGNOSIS — Z0000 Encounter for general adult medical examination without abnormal findings: Secondary | ICD-10-CM

## 2021-03-21 LAB — SARS CORONAVIRUS 2 (TAT 6-24 HRS): SARS Coronavirus 2: POSITIVE — AB

## 2021-03-21 MED ORDER — GABAPENTIN 300 MG PO CAPS
300.0000 mg | ORAL_CAPSULE | Freq: Every day | ORAL | 2 refills | Status: DC
Start: 2021-03-21 — End: 2022-01-14

## 2021-03-21 NOTE — Progress Notes (Signed)
I connected with Teresa Stanley today by telephone and verified that I am speaking with the correct person using two identifiers. Location patient: home Location provider: work Persons participating in the virtual visit: Smita Lesh, Glenna Durand LPN.   I discussed the limitations, risks, security and privacy concerns of performing an evaluation and management service by telephone and the availability of in person appointments. I also discussed with the patient that there may be a patient responsible charge related to this service. The patient expressed understanding and verbally consented to this telephonic visit.    Interactive audio and video telecommunications were attempted between this provider and patient, however failed, due to patient having technical difficulties OR patient did not have access to video capability.  We continued and completed visit with audio only.     Vital signs may be patient reported or missing.  Subjective:   Teresa Stanley is a 76 y.o. female who presents for Medicare Annual (Subsequent) preventive examination.  Review of Systems     Cardiac Risk Factors include: diabetes mellitus;advanced age (>102mn, >>16women);hypertension;obesity (BMI >30kg/m2)     Objective:    Today's Vitals   03/21/21 1136  Weight: 269 lb (122 kg)  Height: '5\' 2"'  (1.575 m)   Body mass index is 49.2 kg/m.  Advanced Directives 03/21/2021 03/22/2020 03/17/2019 05/27/2018 01/16/2016 11/27/2015 11/16/2015  Does Patient Have a Medical Advance Directive? Yes Yes Yes Yes Yes No No  Type of AParamedicof ABendLiving will HCh[place]Living will [place]henando[place]Living will HWashitaLiving will - - -  Does patient want to make changes to medical advance directive? - - - No - Patient declined - - -  Copy of HMasonin Chart? No - copy requested No - copy requested No - copy requested No - copy  requested - - -  Would patient like information on creating a medical advance directive? - - - - - No - patient declined information -    Current Medications (verified) Outpatient Encounter Medications as of 03/21/2021  Medication Sig   amLODipine (NORVASC) 10 MG tablet TAKE 1 TABLET BY MOUTH EVERY DAY IN THE EVENING   aspirin 81 MG tablet Take 81 mg by mouth daily.   atorvastatin (LIPITOR) 20 MG tablet TAKE 1 TABLET BY MOUTH EVERY DAY   Blood Glucose Monitoring Suppl (ONE TOUCH ULTRA 2) w/Device KIT Use as directed to check blood sugars 2 times per day dx:e11.22   carboxymethylcellulose (REFRESH PLUS) 0.5 % SOLN Place 1 drop into both eyes daily.   chlorthalidone (HYGROTON) 25 MG tablet Take 25 mg by mouth daily.   cholecalciferol (VITAMIN D3) 25 MCG (1000 UNIT) tablet Take 1,000 Units by mouth daily.   famotidine (PEPCID) 20 MG tablet Take 20 mg by mouth daily.   gabapentin (NEURONTIN) 300 MG capsule Take 1 capsule (300 mg total) by mouth at bedtime.   hydrALAZINE (APRESOLINE) 25 MG tablet TAKE 1 TABLET BY MOUTH TWICE A DAY   insulin degludec (TRESIBA FLEXTOUCH) 100 UNIT/ML SOPN FlexTouch Pen Inject 16 Units into the skin at bedtime.    Insulin Pen Needle 32G X 4 MM MISC USE WITH TRESIBA   LOKELMA 10 g PACK packet TAKE 1 PACKET DAILY X2 DAYS, THEN START TAKING EVERY OTHER DAY   meclizine (ANTIVERT) 25 MG tablet TAKE 1 TABLET (25 MG TOTAL) BY MOUTH 3 (THREE) TIMES DAILY AS NEEDED FOR DIZZINESS.   methimazole (TAPAZOLE) 5 MG tablet TAKE  1 TABLET BY MOUTH EVERY DAY   olmesartan (BENICAR) 40 MG tablet Take 1 tablet (40 mg total) by mouth daily.   OneTouch Delica Lancets 94T MISC Use as directed to check blood sugars 2 times per day dx: e11.22   ONETOUCH ULTRA test strip USE AS INSTRUCTED TO CHECK BLOOD SUGARS 2 TIMES PER DAY DX: E11.22   propranolol ER (INDERAL LA) 120 MG 24 hr capsule TAKE 1 CAPSULE BY MOUTH EVERY DAY   Semaglutide, 1 MG/DOSE, 4 MG/3ML SOPN Inject 1 mg into the skin once a  week.   sodium bicarbonate 650 MG tablet TAKE 1 TABLET BY MOUTH EVERY DAY   triamcinolone cream (KENALOG) 0.1 % Apply 1 application topically 2 (two) times daily. Apply   No facility-administered encounter medications on file as of 03/21/2021.    Allergies (verified) Patient has no known allergies.   History: Past Medical History:  Diagnosis Date   Arthritis    Diabetes mellitus    takes Actos and Metformin daily and Victoza   GERD (gastroesophageal reflux disease)    takes Omeprazole daily   Headache(784.0)    occasionally   Hyperlipidemia    takes Atorvastatin daily   Hypertension    takes Lisinopril,Amlodipine,and Metoprolol daily   Hypothyroidism    Joint pain    Thyroid disease    ?   Past Surgical History:  Procedure Laterality Date   ABDOMINAL HYSTERECTOMY     BREAST BIOPSY Left    BREAST EXCISIONAL BIOPSY Left    CARDIAC CATHETERIZATION  2009   CATARACT EXTRACTION Right    EYE SURGERY     JOINT REPLACEMENT     KNEE ARTHROPLASTY Left 04/04/2013   Procedure: COMPUTER ASSISTED TOTAL KNEE ARTHROPLASTY;  Surgeon: Marybelle Killings, MD;  Location: [facility];  Service: Orthopedics;  Laterality: Left;  Left Total Knee Arthroplasty, Cemented   TOTAL KNEE ARTHROPLASTY Right 11/26/2015   Procedure: TOTAL KNEE ARTHROPLASTY;  Surgeon: Marybelle Killings, MD;  Location: [facility];  Service: Orthopedics;  Laterality: Right;   Family History  Problem Relation Age of Onset   Diabetes Mother    Hypertension Mother    Diabetes Father    Asthma Father    Hypertension Father    Hypertension Daughter    Hypertension Sister        2   Diabetes Sister    Hypertension Brother        5   Kidney disease Brother    Colon cancer Neg Hx    Esophageal cancer Neg Hx    Rectal cancer Neg Hx    Stomach cancer Neg Hx    Social History   Socioeconomic History   Marital status: Widowed    Spouse name: Not on file   Number of children: 4   Years of education: Not on file   Highest education level:  Not on file  Occupational History   Occupation: retired  Tobacco Use   Smoking status: Never    Passive exposure: Never   Smokeless tobacco: Never  Vaping Use   Vaping Use: Never used  Substance and Sexual Activity   Alcohol use: No   Drug use: No   Sexual activity: Not Currently    Birth control/protection: Surgical  Other Topics Concern   Not on file  Social History Narrative   Not on file   Social Determinants of Health   Financial Resource Strain: Low Risk    Difficulty of Paying Living Expenses: Not hard at all  Food Insecurity: No Food Insecurity   Worried About Charity fundraiser in the Last Year: Never true   Ran Out of Food in the Last Year: Never true  Transportation Needs: No Transportation Needs   Lack of Transportation (Medical): No   Lack of Transportation (Non-Medical): No  Physical Activity: Inactive   Days of Exercise per Week: 0 days   Minutes of Exercise per Session: 0 min  Stress: No Stress Concern Present   Feeling of Stress : Not at all  Social Connections: Not on file    Tobacco Counseling Counseling given: Not Answered   Clinical Intake:  Pre-visit preparation completed: Yes  Pain : No/denies pain     Nutritional Status: BMI > 30  Obese Nutritional Risks: None Diabetes: Yes  How often do you need to have someone help you when you read instructions, pamphlets, or other written materials from your doctor or pharmacy?: 1 - Never What is the last grade level you completed in school?: 12th grade  Diabetic? Yes Nutrition Risk Assessment:  Has the patient had any N/V/D within the last 2 months?  No  Does the patient have any non-healing wounds?  No  Has the patient had any unintentional weight loss or weight gain?  No   Diabetes:  Is the patient diabetic?  Yes  If diabetic, was a CBG obtained today?  No  Did the patient bring in their glucometer from home?  No  How often do you monitor your CBG's? daily.   Financial Strains and  Diabetes Management:  Are you having any financial strains with the device, your supplies or your medication? No .  Does the patient want to be seen by Chronic Care Management for management of their diabetes?  No  Would the patient like to be referred to a Nutritionist or for Diabetic Management?  No   Diabetic Exams:  Diabetic Eye Exam: Completed 04/30/2020 Diabetic Foot Exam: Completed 07/12/2020   Interpreter Needed?: No  Information entered by :: NAllen LPN   Activities of Daily Living In your present state of health, do you have any difficulty performing the following activities: 03/21/2021 03/22/2020  Hearing? N N  Vision? N N  Difficulty concentrating or making decisions? N N  Walking or climbing stairs? N Y  Dressing or bathing? N N  Doing errands, shopping? N N  Preparing Food and eating ? N N  Using the Toilet? N N  In the past six months, have you accidently leaked urine? N N  Do you have problems with loss of bowel control? N N  Managing your Medications? N N  Managing your Finances? N N  Housekeeping or managing your Housekeeping? N N  Some recent data might be hidden    Patient Care Team: [name], MD as PCP - General (Internal Medicine)  Indicate any recent Medical Services you may have received from other than [facility] providers in the past year (date may be approximate).     Assessment:   This is a routine wellness examination for Raivyn.  Hearing/Vision screen Vision Screening - Comments:: Regular eye exams, [facility]  Dietary issues and exercise activities discussed: Current Exercise Habits: The patient does not participate in regular exercise at present   Goals Addressed             This Visit's Progress    Patient Stated       03/21/2021, lose weight and wants to travel this summer  Depression Screen PHQ 2/9 Scores 03/21/2021 03/22/2020 03/17/2019 05/27/2018 02/01/2018 12/29/2017 11/12/2017  PHQ - 2 Score 0 0 0 0 0 0 0  PHQ- 9  Score - - 0 0 - - -    Fall Risk Fall Risk  03/21/2021 03/22/2020 03/17/2019 09/01/2018 07/06/2018  Falls in the past year? 0 0 0 0 0  Risk for fall due to : Medication side effect Impaired balance/gait;Impaired mobility;Medication side effect Medication side effect - -  Follow up Falls evaluation completed;Education provided;Falls prevention discussed Falls evaluation completed;Education provided;Falls prevention discussed Education provided;Falls prevention discussed - -    FALL RISK PREVENTION PERTAINING TO THE HOME:  Any stairs in or around the home? No  If so, are there any without handrails?  N/a Home free of loose throw rugs in walkways, pet beds, electrical cords, etc? Yes  Adequate lighting in your home to reduce risk of falls? Yes   ASSISTIVE DEVICES UTILIZED TO PREVENT FALLS:  Life alert? No  Use of a cane, walker or w/c? Yes  Grab bars in the bathroom? Yes  Shower chair or bench in shower? No  Elevated toilet seat or a handicapped toilet? Yes   TIMED UP AND GO:  Was the test performed? No .      Cognitive Function:     6CIT Screen 03/21/2021 03/22/2020 03/17/2019 05/27/2018  What Year? 0 points 0 points 0 points 0 points  What month? 0 points 0 points 0 points 0 points  What time? 0 points 0 points 0 points 0 points  Count back from 20 0 points 2 points 0 points 0 points  Months in reverse 2 points 0 points 0 points 2 points  Repeat phrase 2 points 4 points 2 points 2 points  Total Score '4 6 2 4    ' Immunizations Immunization History  Administered Date(s) Administered   Fluad Quad(high Dose 65+) 12/26/2020   Influenza, High Dose Seasonal PF 11/12/2017, 11/09/2018, 11/22/2019   PFIZER Comirnaty(Gray Top)Covid-19 Tri-Sucrose Vaccine 08/01/2020   PFIZER(Purple Top)SARS-COV-2 Vaccination 04/04/2019, 04/25/2019, 12/07/2019   Pneumococcal Polysaccharide-23 11/29/2015    TDAP status: Up to date  Flu Vaccine status: Up to date  Pneumococcal vaccine status: Due,  Education has been provided regarding the importance of this vaccine. Advised may receive this vaccine at local pharmacy or Health Dept. Aware to provide a copy of the vaccination record if obtained from local pharmacy or Health Dept. Verbalized acceptance and understanding.  Covid-19 vaccine status: Completed vaccines  Qualifies for Shingles Vaccine? Yes   Zostavax completed No   Shingrix Completed?: No.    Education has been provided regarding the importance of this vaccine. Patient has been advised to call insurance company to determine out of pocket expense if they have not yet received this vaccine. Advised may also receive vaccine at local pharmacy or Health Dept. Verbalized acceptance and understanding.  Screening Tests Health Maintenance  Topic Date Due   Zoster Vaccines- Shingrix (1 of 2) Never done   Pneumonia Vaccine 32+ Years old (2 - PCV) 11/28/2016   COVID-19 Vaccine (5 - Booster for Pfizer series) 09/26/2020   OPHTHALMOLOGY EXAM  04/30/2021   HEMOGLOBIN A1C  06/25/2021   FOOT EXAM  07/12/2021   MAMMOGRAM  06/26/2022   COLONOSCOPY (Pts 45-33yr Insurance coverage will need to be confirmed)  05/02/2024   TETANUS/TDAP  08/12/2027   INFLUENZA VACCINE  Completed   DEXA SCAN  Completed   Hepatitis C Screening  Completed   HPV VACCINES  Aged Out  Health Maintenance  Health Maintenance Due  Topic Date Due   Zoster Vaccines- Shingrix (1 of 2) Never done   Pneumonia Vaccine 85+ Years old (2 - PCV) 11/28/2016   COVID-19 Vaccine (5 - Booster for Pfizer series) 09/26/2020    Colorectal cancer screening: Type of screening: Colonoscopy. Completed 05/03/2014. Repeat every 10 years  Mammogram status: Completed 06/25/2020. Repeat every year  Bone Density status: Completed 09/17/2016.   Lung Cancer Screening: (Low Dose CT Chest recommended if Age 27-80 years, 30 pack-year currently smoking OR have quit w/in 15years.) does not qualify.   Lung Cancer Screening Referral:  no  Additional Screening:  Hepatitis C Screening: does qualify; Completed 02/01/2018  Vision Screening: Recommended annual ophthalmology exams for early detection of glaucoma and other disorders of the eye. Is the patient up to date with their annual eye exam?  Yes  Who is the provider or what is the name of the office in which the patient attends annual eye exams? [facility] If pt is not established with a provider, would they like to be referred to a provider to establish care? No .   Dental Screening: Recommended annual dental exams for proper oral hygiene  Community Resource Referral / Chronic Care Management: CRR required this visit?  No   CCM required this visit?  No      Plan:     I have personally reviewed and noted the following in the patients chart:   Medical and social history Use of alcohol, tobacco or illicit drugs  Current medications and supplements including opioid prescriptions.  Functional ability and status Nutritional status Physical activity Advanced directives List of other physicians Hospitalizations, surgeries, and ER visits in previous 12 months Vitals Screenings to include cognitive, depression, and falls Referrals and appointments  In addition, I have reviewed and discussed with patient certain preventive protocols, quality metrics, and best practice recommendations. A written personalized care plan for preventive services as well as general preventive health recommendations were provided to patient.     Kellie Simmering, LPN   [id number]   Nurse Notes: none  Due to this being a virtual visit, the after visit summary with patients personalized plan was offered to patient via mail or my-chart.  per request, patient was mailed a copy of AVS.

## 2021-03-21 NOTE — Patient Instructions (Signed)
Teresa Stanley , Thank you for taking time to come for your Medicare Wellness Visit. I appreciate your ongoing commitment to your health goals. Please review the following plan we discussed and let me know if I can assist you in the future.   Screening recommendations/referrals: Colonoscopy: completed 05/03/2014, due 05/02/2024 Mammogram: completed 06/25/2020, due 06/26/2021 Bone Density: completed 09/17/2016 Recommended yearly ophthalmology/optometry visit for glaucoma screening and checkup Recommended yearly dental visit for hygiene and checkup  Vaccinations: Influenza vaccine: completed 12/26/2020, due next flu season Pneumococcal vaccine: due now Tdap vaccine: completed 08/11/2017, due 08/12/2027 Shingles vaccine: discussed   Covid-19: 08/01/2020, 12/07/2019, 04/25/2019, 04/04/2019  Advanced directives: Please bring a copy of your POA (Power of Attorney) and/or Living Will to your next appointment.   Conditions/risks identified: none  Next appointment: Follow up in one year for your annual wellness visit    Preventive Care 65 Years and Older, Female Preventive care refers to lifestyle choices and visits with your health care provider that can promote health and wellness. What does preventive care include? A yearly physical exam. This is also called an annual well check. Dental exams once or twice a year. Routine eye exams. Ask your health care provider how often you should have your eyes checked. Personal lifestyle choices, including: Daily care of your teeth and gums. Regular physical activity. Eating a healthy diet. Avoiding tobacco and drug use. Limiting alcohol use. Practicing safe sex. Taking low-dose aspirin every day. Taking vitamin and mineral supplements as recommended by your health care provider. What happens during an annual well check? The services and screenings done by your health care provider during your annual well check will depend on your age, overall health, lifestyle  risk factors, and family history of disease. Counseling  Your health care provider may ask you questions about your: Alcohol use. Tobacco use. Drug use. Emotional well-being. Home and relationship well-being. Sexual activity. Eating habits. History of falls. Memory and ability to understand (cognition). Work and work Statistician. Reproductive health. Screening  You may have the following tests or measurements: Height, weight, and BMI. Blood pressure. Lipid and cholesterol levels. These may be checked every 5 years, or more frequently if you are over 25 years old. Skin check. Lung cancer screening. You may have this screening every year starting at age 66 if you have a 30-pack-year history of smoking and currently smoke or have quit within the past 15 years. Fecal occult blood test (FOBT) of the stool. You may have this test every year starting at age 18. Flexible sigmoidoscopy or colonoscopy. You may have a sigmoidoscopy every 5 years or a colonoscopy every 10 years starting at age 38. Hepatitis C blood test. Hepatitis B blood test. Sexually transmitted disease (STD) testing. Diabetes screening. This is done by checking your blood sugar (glucose) after you have not eaten for a while (fasting). You may have this done every 1-3 years. Bone density scan. This is done to screen for osteoporosis. You may have this done starting at age 35. Mammogram. This may be done every 1-2 years. Talk to your health care provider about how often you should have regular mammograms. Talk with your health care provider about your test results, treatment options, and if necessary, the need for more tests. Vaccines  Your health care provider may recommend certain vaccines, such as: Influenza vaccine. This is recommended every year. Tetanus, diphtheria, and acellular pertussis (Tdap, Td) vaccine. You may need a Td booster every 10 years. Zoster vaccine. You may need this after  age 31. Pneumococcal  13-valent conjugate (PCV13) vaccine. One dose is recommended after age 53. Pneumococcal polysaccharide (PPSV23) vaccine. One dose is recommended after age 34. Talk to your health care provider about which screenings and vaccines you need and how often you need them. This information is not intended to replace advice given to you by your health care provider. Make sure you discuss any questions you have with your health care provider. Document Released: 02/23/2015 Document Revised: 10/17/2015 Document Reviewed: 11/28/2014 Elsevier Interactive Patient Education  2017 [place] Prevention in the Home Falls can cause injuries. They can happen to people of all ages. There are many things you can do to make your home safe and to help prevent falls. What can I do on the outside of my home? Regularly fix the edges of walkways and driveways and fix any cracks. Remove anything that might make you trip as you walk through a door, such as a raised step or threshold. Trim any bushes or trees on the path to your home. Use bright outdoor lighting. Clear any walking paths of anything that might make someone trip, such as rocks or tools. Regularly check to see if handrails are loose or broken. Make sure that both sides of any steps have handrails. Any raised decks and porches should have guardrails on the edges. Have any leaves, snow, or ice cleared regularly. Use sand or salt on walking paths during winter. Clean up any spills in your garage right away. This includes oil or grease spills. What can I do in the bathroom? Use night lights. Install grab bars by the toilet and in the tub and shower. Do not use towel bars as grab bars. Use non-skid mats or decals in the tub or shower. If you need to sit down in the shower, use a plastic, non-slip stool. Keep the floor dry. Clean up any water that spills on the floor as soon as it happens. Remove soap buildup in the tub or shower regularly. Attach  bath mats securely with double-sided non-slip rug tape. Do not have throw rugs and other things on the floor that can make you trip. What can I do in the bedroom? Use night lights. Make sure that you have a light by your bed that is easy to reach. Do not use any sheets or blankets that are too big for your bed. They should not hang down onto the floor. Have a firm chair that has side arms. You can use this for support while you get dressed. Do not have throw rugs and other things on the floor that can make you trip. What can I do in the kitchen? Clean up any spills right away. Avoid walking on wet floors. Keep items that you use a lot in easy-to-reach places. If you need to reach something above you, use a strong step stool that has a grab bar. Keep electrical cords out of the way. Do not use floor polish or wax that makes floors slippery. If you must use wax, use non-skid floor wax. Do not have throw rugs and other things on the floor that can make you trip. What can I do with my stairs? Do not leave any items on the stairs. Make sure that there are handrails on both sides of the stairs and use them. Fix handrails that are broken or loose. Make sure that handrails are as long as the stairways. Check any carpeting to make sure that it is firmly attached to the stairs.  Fix any carpet that is loose or worn. Avoid having throw rugs at the top or bottom of the stairs. If you do have throw rugs, attach them to the floor with carpet tape. Make sure that you have a light switch at the top of the stairs and the bottom of the stairs. If you do not have them, ask someone to add them for you. What else can I do to help prevent falls? Wear shoes that: Do not have high heels. Have rubber bottoms. Are comfortable and fit you well. Are closed at the toe. Do not wear sandals. If you use a stepladder: Make sure that it is fully opened. Do not climb a closed stepladder. Make sure that both sides of the  stepladder are locked into place. Ask someone to hold it for you, if possible. Clearly mark and make sure that you can see: Any grab bars or handrails. First and last steps. Where the edge of each step is. Use tools that help you move around (mobility aids) if they are needed. These include: Canes. Walkers. Scooters. Crutches. Turn on the lights when you go into a dark area. Replace any light bulbs as soon as they burn out. Set up your furniture so you have a clear path. Avoid moving your furniture around. If any of your floors are uneven, fix them. If there are any pets around you, be aware of where they are. Review your medicines with your doctor. Some medicines can make you feel dizzy. This can increase your chance of falling. Ask your doctor what other things that you can do to help prevent falls. This information is not intended to replace advice given to you by your health care provider. Make sure you discuss any questions you have with your health care provider. Document Released: 11/23/2008 Document Revised: 07/05/2015 Document Reviewed: 03/03/2014 Elsevier Interactive Patient Education  2017 [employer].

## 2021-03-25 DIAGNOSIS — N189 Chronic kidney disease, unspecified: Secondary | ICD-10-CM | POA: Diagnosis not present

## 2021-03-25 DIAGNOSIS — N1832 Chronic kidney disease, stage 3b: Secondary | ICD-10-CM | POA: Diagnosis not present

## 2021-03-25 LAB — BASIC METABOLIC PANEL
BUN: 26 — AB (ref 4–21)
CO2: 30 — AB (ref 13–22)
Chloride: 102 (ref 99–108)
Creatinine: 1.5 — AB (ref 0.5–1.1)
Glucose: 220
Potassium: 4.8 (ref 3.4–5.3)
Sodium: 140 (ref 137–147)

## 2021-03-25 LAB — COMPREHENSIVE METABOLIC PANEL
Albumin: 4.3 (ref 3.5–5.0)
Calcium: 9.7 (ref 8.7–10.7)

## 2021-03-26 ENCOUNTER — Telehealth: Payer: Self-pay

## 2021-03-26 NOTE — Chronic Care Management (AMB) (Signed)
Chronic Care Management Pharmacy Assistant   Name: Teresa Stanley  MRN: [id number] DOB: 10-12-1945   Reason for Encounter: Disease State/ Diabetes   Recent office visits:  03-21-2021 Kellie Simmering, LPN. Medicare annual wellness visit  Recent consult visits:  None  Hospital visits:  Medication Reconciliation was completed by comparing discharge summary, patients EMR and Pharmacy list, and upon discussion with patient.  Admitted to the hospital on 03-20-2021 due to Viral upper respiratory infection. Discharge date was 03-20-2021. Discharged from [facility]. Positive covid test  New?Medications Started at [facility] Discharge:?? None  Medication Changes at Hospital Discharge: None  Medications Discontinued at Hospital Discharge: None  Medications that remain the same after Hospital Discharge:??  -All other medications will remain the same.    Hospital visits:  Medication Reconciliation was completed by comparing discharge summary, patients EMR and Pharmacy list, and upon discussion with patient.  Admitted to the hospital on 03-11-2021 due to Laceration of left index finger without foreign body, nail damage status unspecified . Discharge date was 03-11-2021. Discharged from [facility]. Positive covid test  New?Medications Started at [facility] Discharge:?? None  Medication Changes at Hospital Discharge: None  Medications Discontinued at Hospital Discharge: None  Medications that remain the same after Hospital Discharge:??  -All other medications will remain the same.    Medications: Outpatient Encounter Medications as of 03/26/2021  Medication Sig   amLODipine (NORVASC) 10 MG tablet TAKE 1 TABLET BY MOUTH EVERY DAY IN THE EVENING   aspirin 81 MG tablet Take 81 mg by mouth daily.   atorvastatin (LIPITOR) 20 MG tablet TAKE 1 TABLET BY MOUTH EVERY DAY   Blood Glucose Monitoring Suppl (ONE TOUCH ULTRA 2) w/Device KIT Use as directed to check  blood sugars 2 times per day dx:e11.22   carboxymethylcellulose (REFRESH PLUS) 0.5 % SOLN Place 1 drop into both eyes daily.   chlorthalidone (HYGROTON) 25 MG tablet Take 25 mg by mouth daily.   cholecalciferol (VITAMIN D3) 25 MCG (1000 UNIT) tablet Take 1,000 Units by mouth daily.   famotidine (PEPCID) 20 MG tablet Take 20 mg by mouth daily.   gabapentin (NEURONTIN) 300 MG capsule Take 1 capsule (300 mg total) by mouth at bedtime.   hydrALAZINE (APRESOLINE) 25 MG tablet TAKE 1 TABLET BY MOUTH TWICE A DAY   insulin degludec (TRESIBA FLEXTOUCH) 100 UNIT/ML SOPN FlexTouch Pen Inject 16 Units into the skin at bedtime.    Insulin Pen Needle 32G X 4 MM MISC USE WITH TRESIBA   LOKELMA 10 g PACK packet TAKE 1 PACKET DAILY X2 DAYS, THEN START TAKING EVERY OTHER DAY   meclizine (ANTIVERT) 25 MG tablet TAKE 1 TABLET (25 MG TOTAL) BY MOUTH 3 (THREE) TIMES DAILY AS NEEDED FOR DIZZINESS.   methimazole (TAPAZOLE) 5 MG tablet TAKE 1 TABLET BY MOUTH EVERY DAY   olmesartan (BENICAR) 40 MG tablet Take 1 tablet (40 mg total) by mouth daily.   OneTouch Delica Lancets 57W MISC Use as directed to check blood sugars 2 times per day dx: e11.22   ONETOUCH ULTRA test strip USE AS INSTRUCTED TO CHECK BLOOD SUGARS 2 TIMES PER DAY DX: E11.22   propranolol ER (INDERAL LA) 120 MG 24 hr capsule TAKE 1 CAPSULE BY MOUTH EVERY DAY   Semaglutide, 1 MG/DOSE, 4 MG/3ML SOPN Inject 1 mg into the skin once a week.   sodium bicarbonate 650 MG tablet TAKE 1 TABLET BY MOUTH EVERY DAY   triamcinolone cream (KENALOG) 0.1 %  Apply 1 application topically 2 (two) times daily. Apply   No facility-administered encounter medications on file as of 03/26/2021.  119, 120  Recent Relevant Labs: Lab Results  Component Value Date/Time   HGBA1C 7.3 (H) 12/26/2020 02:32 PM   HGBA1C 7.3 (H) 09/13/2020 09:48 AM   MICROALBUR 150 06/18/2020 12:27 PM   MICROALBUR 80 06/16/2019 02:39 PM    Kidney Function Lab Results  Component Value Date/Time    CREATININE 1.56 (H) 06/18/2020 12:10 PM   CREATININE 1.39 (H) 03/22/2020 11:12 AM   CREATININE 1.00 (H) 07/02/2015 07:10 PM   CREATININE 1.00 07/17/2013 01:49 PM   GFR 39.42 (L) 12/25/2017 03:08 PM   GFRNONAA 38 (L) 03/22/2020 11:12 AM   GFRNONAA 58 (L) 07/02/2015 07:10 PM   GFRAA 43 (L) 03/22/2020 11:12 AM   GFRAA 67 07/02/2015 07:10 PM    Current antihyperglycemic regimen:  Ozempic 1 mg weekly Tresiba 100 unit/ml - inject 16 units nightly.  What recent interventions/DTPs have been made to improve glycemic control:  Educated on Exercise goal of 150 minutes per week Prevention and management of hypoglycemic episodes Counseled to check feet daily and get yearly eye exams  Have there been any recent hospitalizations or ED visits since last visit with CPP? Yes  Patient denies hypoglycemic symptoms  Patient denies hyperglycemic symptoms  How often are you checking your blood sugar? once daily  What are your blood sugars ranging?  Fasting: 119, 120 Before meals: None After meals: None Bedtime: None  During the week, how often does your blood glucose drop below 70? Never  Are you checking your feet daily/regularly? daily  Adherence Review: Is the patient currently on a STATIN medication? Yes Is the patient currently on ACE/ARB medication? Yes Does the patient have >5 day gap between last estimated fill dates? No  NOTES: Patient's telephone appointment with [name] CPP on 04-02-2021 rescheduled to March since patient has an office visit with Dr. Baird Cancer on 04-03-2021.  Care Gaps: Shingrix overdue PNA Vac overdue Covid booster overdue AWV 03-21-2021  Star Rating Drug: Ozempic 1 mg- Patient assistance Atorvastatin 20 mg- Last filled 01-21-2021 [facility] Olmesartan 20 mg- Last filled 02-01-2021 [facility]   [place] Clinical Pharmacist Assistant [phone]

## 2021-04-02 ENCOUNTER — Telehealth: Payer: Medicare Other

## 2021-04-02 LAB — HM DIABETES EYE EXAM

## 2021-04-03 ENCOUNTER — Other Ambulatory Visit: Payer: Self-pay

## 2021-04-03 ENCOUNTER — Ambulatory Visit (INDEPENDENT_AMBULATORY_CARE_PROVIDER_SITE_OTHER): Payer: Medicare Other | Admitting: Internal Medicine

## 2021-04-03 ENCOUNTER — Encounter: Payer: Self-pay | Admitting: Internal Medicine

## 2021-04-03 VITALS — BP 132/80 | HR 69 | Temp 98.3°F | Ht 62.0 in | Wt 265.0 lb

## 2021-04-03 DIAGNOSIS — I129 Hypertensive chronic kidney disease with stage 1 through stage 4 chronic kidney disease, or unspecified chronic kidney disease: Secondary | ICD-10-CM | POA: Diagnosis not present

## 2021-04-03 DIAGNOSIS — Z23 Encounter for immunization: Secondary | ICD-10-CM | POA: Diagnosis not present

## 2021-04-03 DIAGNOSIS — N1832 Chronic kidney disease, stage 3b: Secondary | ICD-10-CM | POA: Diagnosis not present

## 2021-04-03 DIAGNOSIS — Z79899 Other long term (current) drug therapy: Secondary | ICD-10-CM | POA: Diagnosis not present

## 2021-04-03 DIAGNOSIS — E1122 Type 2 diabetes mellitus with diabetic chronic kidney disease: Secondary | ICD-10-CM | POA: Diagnosis not present

## 2021-04-03 DIAGNOSIS — Z794 Long term (current) use of insulin: Secondary | ICD-10-CM

## 2021-04-03 DIAGNOSIS — Z6841 Body Mass Index (BMI) 40.0 and over, adult: Secondary | ICD-10-CM

## 2021-04-03 NOTE — Patient Instructions (Signed)

## 2021-04-03 NOTE — Progress Notes (Signed)
Rich Brave [place]leton,acting as a Education administrator for Maximino Greenland, MD.,have documented all relevant documentation on the behalf of Maximino Greenland, MD,as directed by  Maximino Greenland, MD while in the presence of Maximino Greenland, MD.  This visit occurred during the SARS-CoV-2 public health emergency.  Safety protocols were in place, including screening questions prior to the visit, additional usage of staff PPE, and extensive cleaning of exam room while observing appropriate contact time as indicated for disinfecting solutions.  Subjective:     Patient ID: Teresa Stanley , female    DOB: [birth year] , 76 y.o.   MRN: [id number]   Chief Complaint  Patient presents with   Diabetes   Hypertension    HPI  She presents today for f/u DM/HTN f/u. She reports compliance with meds. She denies headaches, chest pain and shortness of breath.   Diabetes She presents for her follow-up diabetic visit. She has type 2 diabetes mellitus. Her disease course has been stable. There are no hypoglycemic associated symptoms. Pertinent negatives for diabetes include no blurred vision and no chest pain. There are no hypoglycemic complications. Diabetic complications include nephropathy. Risk factors for coronary artery disease include diabetes mellitus, dyslipidemia, obesity, post-menopausal and sedentary lifestyle. She is compliant with treatment some of the time. Her weight is decreasing steadily. She is following a generally healthy diet. She participates in exercise intermittently. Her breakfast blood glucose is taken between 8-9 am. Her breakfast blood glucose range is generally 130-140 mg/dl. An ACE inhibitor/angiotensin II receptor blocker is being taken. Eye exam is current.  Hypertension The current episode started more than 1 year ago. The problem has been gradually improving since onset. The problem is uncontrolled. Pertinent negatives include no blurred vision, chest pain, palpitations or shortness of breath. The  current treatment provides moderate improvement. Compliance problems include exercise.  Hypertensive end-organ damage includes kidney disease.    Past Medical History:  Diagnosis Date   Arthritis    Diabetes mellitus    takes Actos and Metformin daily and Victoza   GERD (gastroesophageal reflux disease)    takes Omeprazole daily   Headache(784.0)    occasionally   Hyperlipidemia    takes Atorvastatin daily   Hypertension    takes Lisinopril,Amlodipine,and Metoprolol daily   Hypothyroidism    Joint pain    Thyroid disease    ?     Family History  Problem Relation Age of Onset   Diabetes Mother    Hypertension Mother    Diabetes Father    Asthma Father    Hypertension Father    Hypertension Daughter    Hypertension Sister        2   Diabetes Sister    Hypertension Brother        5   Kidney disease Brother    Colon cancer Neg Hx    Esophageal cancer Neg Hx    Rectal cancer Neg Hx    Stomach cancer Neg Hx      Current Outpatient Medications:    amLODipine (NORVASC) 10 MG tablet, TAKE 1 TABLET BY MOUTH EVERY DAY IN THE EVENING, Disp: 90 tablet, Rfl: 2   aspirin 81 MG tablet, Take 81 mg by mouth daily., Disp: , Rfl:    atorvastatin (LIPITOR) 20 MG tablet, TAKE 1 TABLET BY MOUTH EVERY DAY, Disp: 90 tablet, Rfl: 2   Blood Glucose Monitoring Suppl (ONE TOUCH ULTRA 2) w/Device KIT, Use as directed to check blood sugars 2 times per day dx:e11.22, Disp:  1 each, Rfl: 1   carboxymethylcellulose (REFRESH PLUS) 0.5 % SOLN, Place 1 drop into both eyes daily., Disp: , Rfl:    chlorthalidone (HYGROTON) 25 MG tablet, Take 25 mg by mouth daily., Disp: , Rfl:    cholecalciferol (VITAMIN D3) 25 MCG (1000 UNIT) tablet, Take 1,000 Units by mouth daily., Disp: , Rfl:    famotidine (PEPCID) 20 MG tablet, Take 20 mg by mouth daily., Disp: , Rfl:    gabapentin (NEURONTIN) 300 MG capsule, Take 1 capsule (300 mg total) by mouth at bedtime., Disp: 90 capsule, Rfl: 2   hydrALAZINE (APRESOLINE) 25  MG tablet, TAKE 1 TABLET BY MOUTH TWICE A DAY, Disp: 60 tablet, Rfl: 1   insulin degludec (TRESIBA FLEXTOUCH) 100 UNIT/ML SOPN FlexTouch Pen, Inject 16 Units into the skin at bedtime. , Disp: , Rfl:    Insulin Pen Needle 32G X 4 MM MISC, USE WITH TRESIBA, Disp: 100 each, Rfl: 10   LOKELMA 10 g PACK packet, Take 10 g by mouth. Every other day, Disp: , Rfl:    meclizine (ANTIVERT) 25 MG tablet, TAKE 1 TABLET (25 MG TOTAL) BY MOUTH 3 (THREE) TIMES DAILY AS NEEDED FOR DIZZINESS., Disp: 30 tablet, Rfl: 0   methimazole (TAPAZOLE) 5 MG tablet, TAKE 1 TABLET BY MOUTH EVERY DAY, Disp: 90 tablet, Rfl: 1   olmesartan (BENICAR) 40 MG tablet, Take 1 tablet (40 mg total) by mouth daily., Disp: 90 tablet, Rfl: 2   OneTouch Delica Lancets 70V MISC, Use as directed to check blood sugars 2 times per day dx: e11.22, Disp: 300 each, Rfl: 2   ONETOUCH ULTRA test strip, USE AS INSTRUCTED TO CHECK BLOOD SUGARS 2 TIMES PER DAY DX: E11.22, Disp: 200 strip, Rfl: 4   propranolol ER (INDERAL LA) 120 MG 24 hr capsule, TAKE 1 CAPSULE BY MOUTH EVERY DAY, Disp: 90 capsule, Rfl: 2   Semaglutide, 1 MG/DOSE, 4 MG/3ML SOPN, Inject 1 mg into the skin once a week., Disp: 3 mL, Rfl: 2   sodium bicarbonate 650 MG tablet, TAKE 1 TABLET BY MOUTH EVERY DAY, Disp: 90 tablet, Rfl: 3   triamcinolone cream (KENALOG) 0.1 %, Apply 1 application topically 2 (two) times daily. Apply, Disp: 45 g, Rfl: 1   No Known Allergies   Review of Systems  Constitutional: Negative.   Eyes:  Negative for blurred vision.  Respiratory: Negative.  Negative for shortness of breath.   Cardiovascular: Negative.  Negative for chest pain and palpitations.  Gastrointestinal: Negative.   Neurological: Negative.   Psychiatric/Behavioral: Negative.      Today's Vitals   04/03/21 1002  BP: 132/80  Pulse: 69  Temp: 98.3 F (36.8 C)  Weight: 265 lb (120.2 kg)  Height: '5\' 2"'  (1.575 m)  PainSc: 0-No pain   Body mass index is 48.47 kg/m.  Wt Readings from Last  3 Encounters:  04/03/21 265 lb (120.2 kg)  03/21/21 269 lb (122 kg)  12/26/20 268 lb 3.2 oz (121.7 kg)     Objective:  Physical Exam Vitals and nursing note reviewed.  Constitutional:      Appearance: Normal appearance. She is obese.  HENT:     Head: Normocephalic and atraumatic.     Nose:     Comments: Masked     Mouth/Throat:     Comments: Masked  Eyes:     Extraocular Movements: Extraocular movements intact.  Cardiovascular:     Rate and Rhythm: Normal rate and regular rhythm.     Heart sounds: Normal  heart sounds.  Pulmonary:     Effort: Pulmonary effort is normal.     Breath sounds: Normal breath sounds.  Musculoskeletal:     Cervical back: Normal range of motion.     Comments: Ambulatory with cane.   Skin:    General: Skin is warm.  Neurological:     General: No focal deficit present.     Mental Status: She is alert.  Psychiatric:        Mood and Affect: Mood normal.        Behavior: Behavior normal.     Assessment And Plan:     1. Type 2 diabetes mellitus with stage 3b chronic kidney disease, with long-term current use of insulin (HCC) Comments: Chronic, I will check labs as below. Advised to keep BP/BS controlled, avoid NSAIDs and stay well hydated to decrease risk of CKD progression.  - Hemoglobin A1c - CMP14+EGFR  2. Hypertensive nephropathy Comments: Chronic, fair control. Goal BP <130/80. Advised to follow low sodium diet. No med changes today. She will c/w amlodipine, olmesartan and hydralazine.   3. Class 3 severe obesity due to excess calories with serious comorbidity and body mass index (BMI) of 45.0 to 49.9 in adult [facility]) Comments: BMI 48. She is encouraged to aim for at least 150 minutes of exercise per week. Advised to strive for BMI<40 to decrease cardiac risk.   4. Immunization due Comments: She was given Shingrix IM x1. This will be billed via [employer].   5. Polypharmacy - Vitamin B12   Patient was given opportunity to ask questions.  Patient verbalized understanding of the plan and was able to repeat key elements of the plan. All questions were answered to their satisfaction.   I, Maximino Greenland, MD, have reviewed all documentation for this visit. The documentation on 04/03/21 for the exam, diagnosis, procedures, and orders are all accurate and complete.   IF YOU HAVE BEEN REFERRED TO A SPECIALIST, IT MAY TAKE 1-2 WEEKS TO SCHEDULE/PROCESS THE REFERRAL. IF YOU HAVE NOT HEARD FROM US/SPECIALIST IN TWO WEEKS, PLEASE GIVE [place] A CALL AT [phone] X [phone].   THE PATIENT IS ENCOURAGED TO PRACTICE SOCIAL DISTANCING DUE TO THE COVID-19 PANDEMIC.

## 2021-04-04 DIAGNOSIS — E1122 Type 2 diabetes mellitus with diabetic chronic kidney disease: Secondary | ICD-10-CM | POA: Diagnosis not present

## 2021-04-04 DIAGNOSIS — N1832 Chronic kidney disease, stage 3b: Secondary | ICD-10-CM | POA: Diagnosis not present

## 2021-04-04 DIAGNOSIS — N2581 Secondary hyperparathyroidism of renal origin: Secondary | ICD-10-CM | POA: Diagnosis not present

## 2021-04-04 DIAGNOSIS — E872 Acidosis, unspecified: Secondary | ICD-10-CM | POA: Diagnosis not present

## 2021-04-04 DIAGNOSIS — N390 Urinary tract infection, site not specified: Secondary | ICD-10-CM | POA: Diagnosis not present

## 2021-04-04 DIAGNOSIS — R809 Proteinuria, unspecified: Secondary | ICD-10-CM | POA: Diagnosis not present

## 2021-04-04 DIAGNOSIS — D631 Anemia in chronic kidney disease: Secondary | ICD-10-CM | POA: Diagnosis not present

## 2021-04-04 DIAGNOSIS — I129 Hypertensive chronic kidney disease with stage 1 through stage 4 chronic kidney disease, or unspecified chronic kidney disease: Secondary | ICD-10-CM | POA: Diagnosis not present

## 2021-04-04 LAB — CMP14+EGFR
ALT: 12 IU/L (ref 0–32)
AST: 17 IU/L (ref 0–40)
Albumin/Globulin Ratio: 1.4 (ref 1.2–2.2)
Albumin: 4.2 g/dL (ref 3.7–4.7)
Alkaline Phosphatase: 138 IU/L — ABNORMAL HIGH (ref 44–121)
BUN/Creatinine Ratio: 14 (ref 12–28)
BUN: 26 mg/dL (ref 8–27)
Bilirubin Total: 0.5 mg/dL (ref 0.0–1.2)
CO2: 26 mmol/L (ref 20–29)
Calcium: 10.4 mg/dL — ABNORMAL HIGH (ref 8.7–10.3)
Chloride: 102 mmol/L (ref 96–106)
Creatinine, Ser: 1.9 mg/dL — ABNORMAL HIGH (ref 0.57–1.00)
Globulin, Total: 2.9 g/dL (ref 1.5–4.5)
Glucose: 167 mg/dL — ABNORMAL HIGH (ref 70–99)
Potassium: 6 mmol/L — ABNORMAL HIGH (ref 3.5–5.2)
Sodium: 143 mmol/L (ref 134–144)
Total Protein: 7.1 g/dL (ref 6.0–8.5)
eGFR: 27 mL/min/{1.73_m2} — ABNORMAL LOW (ref >59)

## 2021-04-04 LAB — VITAMIN B12: Vitamin B-12: 1167 pg/mL (ref 232–1245)

## 2021-04-04 LAB — HEMOGLOBIN A1C
Est. average glucose Bld gHb Est-mCnc: 166 mg/dL
Hgb A1c MFr Bld: 7.4 % — ABNORMAL HIGH (ref 4.8–5.6)

## 2021-04-09 ENCOUNTER — Telehealth: Payer: Self-pay

## 2021-04-09 NOTE — Telephone Encounter (Signed)
CARE PLAN ENTRY (see longitudinal plan of care for additional care plan information)  Current Barriers:  Financial Barriers: patient has [facility]  insurance and reports copay for [name] is cost prohibitive at this time   Pharmacist Clinical Goal(s):  Over the next 30 days, patient will work with PharmD and providers to relieve medication access concerns  Interventions: Comprehensive medication review completed; medication list updated in electronic medical record.  Inter-disciplinary care team collaboration (see longitudinal plan of care) Called patient to inform her that her [name] is available for pick up.  Patient reports that she will pick up this week.    [name], CPP, PharmD Clinical Pharmacist Practitioner [facility] [phone]

## 2021-04-11 ENCOUNTER — Encounter: Payer: Self-pay | Admitting: Internal Medicine

## 2021-04-16 ENCOUNTER — Telehealth: Payer: Self-pay

## 2021-04-16 NOTE — Telephone Encounter (Signed)
Pt notified of patient assistance, ready for pick up.  ?

## 2021-05-02 DIAGNOSIS — Z961 Presence of intraocular lens: Secondary | ICD-10-CM | POA: Diagnosis not present

## 2021-05-02 DIAGNOSIS — E119 Type 2 diabetes mellitus without complications: Secondary | ICD-10-CM | POA: Diagnosis not present

## 2021-05-02 DIAGNOSIS — H353111 Nonexudative age-related macular degeneration, right eye, early dry stage: Secondary | ICD-10-CM | POA: Diagnosis not present

## 2021-05-02 DIAGNOSIS — H43821 Vitreomacular adhesion, right eye: Secondary | ICD-10-CM | POA: Diagnosis not present

## 2021-05-02 DIAGNOSIS — H04123 Dry eye syndrome of bilateral lacrimal glands: Secondary | ICD-10-CM | POA: Diagnosis not present

## 2021-05-06 ENCOUNTER — Telehealth: Payer: Self-pay

## 2021-05-06 NOTE — Chronic Care Management (AMB) (Signed)
? ? ? ?  Teresa Stanley was reminded to have all medications, supplements and any blood glucose and blood pressure readings available for review with Teresa Stanley, Pharm. D, at her telephone visit on 05-08-2021 at 2:00. Patient stated she will be moving into a town home this week and wants to reschedule to April 11 at 12:00. ? ? ?Malecca Hicks CMA ?Clinical Pharmacist Assistant ?[phone] ? ? ?

## 2021-05-08 ENCOUNTER — Telehealth: Payer: Medicare Other

## 2021-05-16 ENCOUNTER — Telehealth: Payer: Self-pay

## 2021-05-16 NOTE — Chronic Care Management (AMB) (Signed)
? ? ? ?  [name] was reminded to have all medications, supplements and any blood glucose and blood pressure readings available for review with [name], Pharm. D, at her telephone visit on 05-21-2021 at 12:00. ? ? ?Malecca Hicks CMA ?Clinical Pharmacist Assistant ?[phone] ? ? ?

## 2021-05-21 ENCOUNTER — Ambulatory Visit (INDEPENDENT_AMBULATORY_CARE_PROVIDER_SITE_OTHER): Payer: Medicare Other

## 2021-05-21 DIAGNOSIS — Z794 Long term (current) use of insulin: Secondary | ICD-10-CM

## 2021-05-21 DIAGNOSIS — I129 Hypertensive chronic kidney disease with stage 1 through stage 4 chronic kidney disease, or unspecified chronic kidney disease: Secondary | ICD-10-CM

## 2021-05-21 DIAGNOSIS — E782 Mixed hyperlipidemia: Secondary | ICD-10-CM

## 2021-05-21 NOTE — Progress Notes (Signed)
? ?Chronic Care Management ?Pharmacy Note ? ?05/22/2021 ?Name:  Teresa Stanley MRN:  [id number] DOB:  06-Mar-1945 ? ?Summary: ?Patient reports that she is taking her medication everyday.  Patient reports that she is having swelling on the top of her foot.  ? ?Recommendations/Changes made from today's visit: ?Check her BP at the same time each day.  ?Exercise 30 minutes three times per week  ?Recommend patients Atorvastatin 20 mg tablet once  ?PCP recommended patient contact [place] for a visit, I gave Ms. Cedillo the phone number to the office and she is going to call them at 1 pm today.  ? ?Plan: ?She is going to log her readings in a book and keep it with her ?Will collaborate with PCP to increase patients Atorvastatin to 40 mg tablet ?She is going to increase the amount of water she drinks by one cup per day.  ?Patient will let us know when she makes her appointment with the foot doctor  ? ? ? ?Subjective: ?Teresa Stanley is an 76 y.o. year old female who is a primary patient of [name], MD.  The CCM team was consulted for assistance with disease management and care coordination needs.   ? ?Engaged with patient by telephone for follow up visit in response to provider referral for pharmacy case management and/or care coordination services. Teresa Stanley has moved closer to her son, and she is very happy with that decision.  ? ?Consent to Services:  ?The patient was given information about Chronic Care Management services, agreed to services, and gave verbal consent prior to initiation of services.  Please see initial visit note for detailed documentation.  ? ?Patient Care Team: ?[name], MD as PCP - General (Internal Medicine) ? ?Recent office visits: ?04/04/2021 PCP OV ? ?Recent consult visits: ?01/02/2021 Nephrology OV  ? ?Hospital visits: ?None in previous 6 months ? ? ?Objective: ? ?Lab Results  ?Component Value Date  ? CREATININE 1.90 (H) 04/03/2021  ? BUN 26 04/03/2021  ? GFR 39.42 (L)  12/25/2017  ? EGFR 27 (L) 04/03/2021  ? GFRNONAA 38 (L) 03/22/2020  ? GFRAA 43 (L) 03/22/2020  ? NA 143 04/03/2021  ? K 6.0 (H) 04/03/2021  ? CALCIUM 10.4 (H) 04/03/2021  ? CO2 26 04/03/2021  ? GLUCOSE 167 (H) 04/03/2021  ? ? ?Lab Results  ?Component Value Date/Time  ? HGBA1C 7.4 (H) 04/03/2021 10:51 AM  ? HGBA1C 7.3 (H) 12/26/2020 02:32 PM  ? GFR 39.42 (L) 12/25/2017 03:08 PM  ? MICROALBUR 150 06/18/2020 12:27 PM  ? MICROALBUR 80 06/16/2019 02:39 PM  ?  ?Last diabetic Eye exam:  ?Lab Results  ?Component Value Date/Time  ? HMDIABEYEEXA No Retinopathy 05/02/2021 12:00 AM  ?  ?Last diabetic Foot exam: No results found for: HMDIABFOOTEX  ? ?Lab Results  ?Component Value Date  ? CHOL 164 12/26/2020  ? HDL 59 12/26/2020  ? [place] 82 12/26/2020  ? TRIG 133 12/26/2020  ? CHOLHDL 2.8 12/26/2020  ? ? ? ?  Latest Ref Rng & Units 04/03/2021  ? 10:51 AM 03/25/2021  ? 12:00 AM 12/26/2020  ?  2:32 PM  ?Hepatic Function  ?Total Protein 6.0 - 8.5 g/dL 7.1    7.0    ?Albumin 3.7 - 4.7 g/dL 4.2   4.3      4.2    ?AST 0 - 40 IU/L 17    16    ?ALT 0 - 32 IU/L 12    12    ?  Alk Phosphatase 44 - 121 IU/L 138    135    ?Total Bilirubin 0.0 - 1.2 mg/dL 0.5    0.3    ?Bilirubin, Direct 0.00 - 0.40 mg/dL   <0.10    ?  ? This result is from an external source.  ? ? ?Lab Results  ?Component Value Date/Time  ? TSH 2.930 03/22/2020 11:12 AM  ? TSH 0.005 (L) 08/23/2009 07:54 PM  ? FREET4 1.73 03/22/2020 11:12 AM  ? FREET4 1.33 08/23/2009 07:54 PM  ? ? ? ?  Latest Ref Rng & Units 06/18/2020  ? 12:10 PM 03/22/2020  ? 11:12 AM 06/16/2019  ?  5:02 PM  ?CBC  ?WBC 3.4 - 10.8 x10E3/uL 8.4   7.5   7.9    ?Hemoglobin 11.1 - 15.9 g/dL 12.9   12.9   13.2    ?Hematocrit 34.0 - 46.6 % 40.5   41.2   40.9    ?Platelets 150 - 450 x10E3/uL 388   305   271    ? ? ?No results found for: VD25OH ? ?Clinical ASCVD: No  ?The 10-year ASCVD risk score (Arnett DK, et al., 2019) is: 28.7% ?  Values used to calculate the score: ?    Age: 76 years ?    Sex: Female ?    Is  Non-Hispanic African American: Yes ?    Diabetic: Yes ?    Tobacco smoker: No ?    Systolic Blood Pressure: 329 mmHg ?    Is BP treated: Yes ?    HDL Cholesterol: 59 mg/dL ?    Total Cholesterol: 164 mg/dL   ? ? ?  03/21/2021  ? 11:42 AM 03/22/2020  ?  9:50 AM 03/17/2019  ?  9:46 AM  ?Depression screen PHQ 2/9  ?Decreased Interest 0 0 0  ?Down, Depressed, Hopeless 0 0 0  ?PHQ - 2 Score 0 0 0  ?Altered sleeping   0  ?Tired, decreased energy   0  ?Change in appetite   0  ?Feeling bad or failure about yourself    0  ?Trouble concentrating   0  ?Moving slowly or fidgety/restless   0  ?Suicidal thoughts   0  ?PHQ-9 Score   0  ?Difficult doing work/chores   Not difficult at all  ?  ? ? ?Social History  ? ?Tobacco Use  ?Smoking Status Never  ? Passive exposure: Never  ?Smokeless Tobacco Never  ? ?BP Readings from Last 3 Encounters:  ?04/03/21 132/80  ?03/20/21 (!) 166/90  ?03/11/21 (!) 167/82  ? ?Pulse Readings from Last 3 Encounters:  ?04/03/21 69  ?03/20/21 88  ?03/11/21 79  ? ?Wt Readings from Last 3 Encounters:  ?04/03/21 265 lb (120.2 kg)  ?03/21/21 269 lb (122 kg)  ?12/26/20 268 lb 3.2 oz (121.7 kg)  ? ?BMI Readings from Last 3 Encounters:  ?04/03/21 48.47 kg/m?  ?03/21/21 49.20 kg/m?  ?12/26/20 50.35 kg/m?  ? ? ?Assessment/Interventions: Review of patient past medical history, allergies, medications, health status, including review of consultants reports, laboratory and other test data, was performed as part of comprehensive evaluation and provision of chronic care management services.  ? ?SDOH:  (Social Determinants of Health) assessments and interventions performed: No ? ?SDOH Screenings  ? ?Alcohol Screen: Not on file  ?Depression (PHQ2-9): Low Risk   ? PHQ-2 Score: 0  ?Financial Resource Strain: Low Risk   ? Difficulty of Paying Living Expenses: Not hard at all  ?Food Insecurity: No Food Insecurity  ?  Worried About Charity fundraiser in the Last Year: Never true  ? Ran Out of Food in the Last Year: Never true   ?Housing: Not on file  ?Physical Activity: Inactive  ? Days of Exercise per Week: 0 days  ? Minutes of Exercise per Session: 0 min  ?Social Connections: Not on file  ?Stress: No Stress Concern Present  ? Feeling of Stress : Not at all  ?Tobacco Use: Low Risk   ? Smoking Tobacco Use: Never  ? Smokeless Tobacco Use: Never  ? Passive Exposure: Never  ?Transportation Needs: No Transportation Needs  ? Lack of Transportation (Medical): No  ? Lack of Transportation (Non-Medical): No  ? ? ?CCM Care Plan ? ?No Known Allergies ? ?Medications Reviewed Today   ? ? Reviewed by Mayford Knife, RPH (Pharmacist) on 05/21/21 at 1252  Med List Status: <None>  ? ?Medication Order Taking? Sig Documenting Provider Last Dose Status Informant  ?amLODipine (NORVASC) 10 MG tablet [id number] No TAKE 1 TABLET BY MOUTH EVERY DAY IN THE EVENING Ghumman, Ramandeep, NP Taking Active   ?aspirin 81 MG tablet [id number] No Take 81 mg by mouth daily. [provider] Taking Active   ?atorvastatin (LIPITOR) 20 MG tablet [id number] No TAKE 1 TABLET BY MOUTH EVERY DAY [name], MD Taking Active   ?Blood Glucose Monitoring Suppl (ONE TOUCH ULTRA 2) w/Device KIT [id number] No Use as directed to check blood sugars 2 times per day dx:e11.22 [name], MD Taking Active   ?carboxymethylcellulose (REFRESH PLUS) 0.5 % SOLN [id number] No Place 1 drop into both eyes daily. [provider] Taking Active Self  ?chlorthalidone (HYGROTON) 25 MG tablet [id number] No Take 25 mg by mouth daily. [provider] Taking Active   ?cholecalciferol (VITAMIN D3) 25 MCG (1000 UNIT) tablet [id number] No Take 1,000 Units by mouth daily. [provider] Taking Active   ?famotidine (PEPCID) 20 MG tablet [id number] No Take 20 mg by mouth daily. [provider] Taking Active   ?gabapentin (NEURONTIN) 300 MG capsule [id number] No Take 1 capsule (300 mg total) by mouth at bedtime. [name], MD Taking Active   ?hydrALAZINE  (APRESOLINE) 25 MG tablet [id number] No TAKE 1 TABLET BY MOUTH TWICE A Lynnell Dike, MD Taking Active   ?insulin degludec (TRESIBA FLEXTOUCH) 100 UNIT/ML SOPN FlexTouch Pen [id number] No Inject 16 Units into the skin at

## 2021-05-22 NOTE — Patient Instructions (Addendum)
Visit Information ?It was great speaking with you today!  Please let me know if you have any questions about our visit. ? ? Goals Addressed   ? ?  ?  ?  ?  ? This Visit's Progress  ?  Manage My Medicine     ?  Timeframe:  Long-Range Goal ?Priority:  High ?Start Date:                             ?Expected End Date:                      ? ?Follow Up Date 10/04/2021 ? ?In Progress: ?- call for medicine refill 2 or 3 days before it runs out ?- call if I am sick and can't take my medicine ?- use a pillbox to sort medicine ?- use an alarm clock or phone to remind me to take my medicine  ?  ?Why is this important?   ?These steps will help you keep on track with your medicines. ?  ?Notes:  ?Try to take your medication at the same time each day ?Keep up the good work ?  ? ?  ? ? ?Patient Care Plan: Pharmacy Care Plan  ?  ? ?Problem Identified: HTN, DM II, HLD   ?Priority: High  ?  ? ?Long-Range Goal: Disease Management   ?Recent Progress: On track  ?Note:   ? ?Current Barriers:  ?Unable to independently monitor therapeutic efficacy ? ?Pharmacist Clinical Goal(s):  ?Patient will achieve adherence to monitoring guidelines and medication adherence to achieve therapeutic efficacy through collaboration with PharmD and provider.  ? ?Interventions: ?1:1 collaboration with [name], MD regarding development and update of comprehensive plan of care as evidenced by provider attestation and co-signature ?Inter-disciplinary care team collaboration (see longitudinal plan of care) ?Comprehensive medication review performed; medication list updated in electronic medical record ? ?Hypertension (BP goal <130/80) ?-Controlled ?-Current treatment: ?Chlorthalidone 25 mg by mouth daily Appropriate, Effective, Safe, Accessible ?Olmesartan 40 mg tablet - take 1 tablet by mouth daily Appropriate, Effective, Safe, Accessible ?Hydralazine 25 mg tablet twice per day Appropriate, Effective, Safe, Accessible ?Propranolol ER 120 mg take 1 capsule  by mouth daily Appropriate, Effective, Safe, Accessible ?Amlodipine 10 mg tablet once per day in the evening  Appropriate, Effective, Safe, Accessible ?-Current home readings: 151/75, 138/67,120/67 ?-Current dietary habits: patient reports that she cut back, and she has a lost a few pounds.  ?-Current exercise habits: she is walking steps when someone is at her house, but she uses her chair when no one is there.  ?-Denies hypotensive/hypertensive symptoms ?-Educated on Importance of home blood pressure monitoring; ?Proper BP monitoring technique; ?-Counseled to monitor BP at home at least 4 times per week, document, and provide log at future appointments ?-Recommended to continue current medication ?Educated on how to check BP again, and re discussed how to closely monitor BP.  ? ?Hyperlipidemia: (LDL goal < 55) ?-Uncontrolled ?-Current treatment: ?Atorvastatin 20 mg tablet daily. Appropriate, Query effective ?-Current dietary patterns: patient reports that she  ?-Current exercise habits: patient reports that she  ?-Educated on Cholesterol goals;  ?Benefits of statin for ASCVD risk reduction; ?-Recommended to continue current medication ? ?Diabetes (A1c goal <7%) ?-Not ideally controlled ?-Current medications: ?Tresiba 100 unit/ml - inject 16 units into the skin at bedtime  Appropriate, Effective, Safe, Accessible ?Ozempic 1 mg into the skin once a week Appropriate, Query effective ?At this time  patient is not interested in changing her dose.  ?-Current home glucose readings ?fasting glucose: 99,[id number] - she reports that her sugars are doing well ?-Denies hypoglycemic/hyperglycemic symptoms ?-Current meal patterns: she is eating better but prior to moving she was eating out a lot because her pans and things were already packed. She has started cooking again, including this morning when she had [place] sausage, and sometimes one egg for breakfast. She is drinking plenty of water, she is drinking at least three of the  12 ounce bottles of water per day, and she also drinks water from the faucet.  ?-She is going to cut back on her deserts, she had a honey  ?-She is going to increase her low potassium vegetables like cabbage ?-Current exercise: her goal is  to start do stretching exercises three times per week for 30 minutes  ?-Educated on Complications of diabetes including kidney damage, retinal damage, and cardiovascular disease; ?-Counseled to check feet daily and get yearly eye exams ?-Recommended to continue current medication ? ?Patient Goals/Self-Care Activities ?Patient will:  ?- take medications as prescribed as evidenced by patient report and record review ? ?Follow Up Plan: The patient has been provided with contact information for the care management team and has been advised to call with any health related questions or concerns.  ?  ? ? ? ?Patient agreed to services and verbal consent obtained.  ? ?The patient verbalized understanding of instructions, educational materials, and care plan provided today and agreed to receive a mailed copy of patient instructions, educational materials, and care plan.  ? ?[name], PharmD ?Clinical Pharmacist ?[facility] ?[phone] ?  ?

## 2021-05-23 ENCOUNTER — Ambulatory Visit: Payer: Medicare Other | Admitting: Sports Medicine

## 2021-05-23 ENCOUNTER — Ambulatory Visit (INDEPENDENT_AMBULATORY_CARE_PROVIDER_SITE_OTHER): Payer: Medicare Other

## 2021-05-23 ENCOUNTER — Encounter: Payer: Self-pay | Admitting: Sports Medicine

## 2021-05-23 DIAGNOSIS — M7752 Other enthesopathy of left foot: Secondary | ICD-10-CM | POA: Diagnosis not present

## 2021-05-23 DIAGNOSIS — M25572 Pain in left ankle and joints of left foot: Secondary | ICD-10-CM

## 2021-05-23 DIAGNOSIS — M7750 Other enthesopathy of unspecified foot: Secondary | ICD-10-CM | POA: Diagnosis not present

## 2021-05-23 DIAGNOSIS — E1140 Type 2 diabetes mellitus with diabetic neuropathy, unspecified: Secondary | ICD-10-CM

## 2021-05-23 MED ORDER — TRIAMCINOLONE ACETONIDE 10 MG/ML IJ SUSP
10.0000 mg | Freq: Once | INTRAMUSCULAR | Status: AC
  Administered 2021-05-23: 10 mg

## 2021-05-23 NOTE — Progress Notes (Addendum)
Subjective: ? ?Teresa [facility] is a 76 y.o. female patient who presents to office for evaluation of left ankle pain. Patient complains of continued swelling and some occasional pain in the ankle greater than the foot since last Friday.. Patient denies any other pedal complaints. Denies injury/trip/fall/sprain/any causative factors.  ? ?Patient is diabetic blood sugar recorded at 116 today last A1c 7.4 last visit to PCP Dr. Baird Cancer was 04/03/2021 ? ?Patient Active Problem List  ? Diagnosis Date Noted  ? Class 3 severe obesity due to excess calories with serious comorbidity and body mass index (BMI) of 45.0 to 49.9 in adult [facility]) 05/28/2018  ? Hypertensive nephropathy 08/11/2017  ? Right knee DJD 11/26/2015  ? S/P total knee arthroplasty 04/18/2013  ? Unilateral primary osteoarthritis, right knee 04/04/2013  ?  Class: Diagnosis of  ? Candidal intertrigo 08/09/2012  ? Hypertension 12/15/2011  ? Diabetes mellitus with stage 3 chronic kidney disease ([place]) 12/15/2011  ? ? ?Current Outpatient Medications on File Prior to Visit  ?Medication Sig Dispense Refill  ? amLODipine (NORVASC) 10 MG tablet TAKE 1 TABLET BY MOUTH EVERY DAY IN THE EVENING 90 tablet 2  ? aspirin 81 MG tablet Take 81 mg by mouth daily.    ? atorvastatin (LIPITOR) 20 MG tablet TAKE 1 TABLET BY MOUTH EVERY DAY 90 tablet 2  ? Blood Glucose Calibration (OT ULTRA/FASTTK CNTRL SOLN) SOLN     ? Blood Glucose Monitoring Suppl (ONE TOUCH ULTRA 2) w/Device KIT Use as directed to check blood sugars 2 times per day dx:e11.22 1 each 1  ? carboxymethylcellulose (REFRESH PLUS) 0.5 % SOLN Place 1 drop into both eyes daily.    ? chlorthalidone (HYGROTON) 25 MG tablet Take 25 mg by mouth daily.    ? cholecalciferol (VITAMIN D3) 25 MCG (1000 UNIT) tablet Take 1,000 Units by mouth daily.    ? famotidine (PEPCID) 20 MG tablet Take 20 mg by mouth daily.    ? gabapentin (NEURONTIN) 300 MG capsule Take 1 capsule (300 mg total) by mouth at bedtime. 90 capsule 2  ? hydrALAZINE  (APRESOLINE) 25 MG tablet TAKE 1 TABLET BY MOUTH TWICE A DAY 60 tablet 1  ? insulin degludec (TRESIBA FLEXTOUCH) 100 UNIT/ML SOPN FlexTouch Pen Inject 16 Units into the skin at bedtime.     ? Insulin Pen Needle 32G X 4 MM MISC USE WITH TRESIBA 100 each 10  ? LOKELMA 10 g PACK packet Take 10 g by mouth. Every other day    ? meclizine (ANTIVERT) 25 MG tablet TAKE 1 TABLET (25 MG TOTAL) BY MOUTH 3 (THREE) TIMES DAILY AS NEEDED FOR DIZZINESS. 30 tablet 0  ? methimazole (TAPAZOLE) 5 MG tablet TAKE 1 TABLET BY MOUTH EVERY DAY 90 tablet 1  ? nitrofurantoin (MACRODANTIN) 100 MG capsule Take 100 mg by mouth 2 (two) times daily.    ? olmesartan (BENICAR) 20 MG tablet Take 20 mg by mouth daily.    ? olmesartan (BENICAR) 40 MG tablet Take 1 tablet (40 mg total) by mouth daily. 90 tablet 2  ? OneTouch Delica Lancets 16X MISC Use as directed to check blood sugars 2 times per day dx: e11.22 300 each 2  ? ONETOUCH ULTRA test strip USE AS INSTRUCTED TO CHECK BLOOD SUGARS 2 TIMES PER DAY DX: E11.22 200 strip 4  ? propranolol ER (INDERAL LA) 120 MG 24 hr capsule TAKE 1 CAPSULE BY MOUTH EVERY DAY 90 capsule 2  ? Semaglutide, 1 MG/DOSE, 4 MG/3ML SOPN Inject 1 mg into  the skin once a week. 3 mL 2  ? sodium bicarbonate 650 MG tablet TAKE 1 TABLET BY MOUTH EVERY DAY 90 tablet 3  ? triamcinolone cream (KENALOG) 0.1 % Apply 1 application topically 2 (two) times daily. Apply 45 g 1  ? ?No current facility-administered medications on file prior to visit.  ? ? ?No Known Allergies ? ?Objective:  ?General: Alert and oriented x3 in no acute distress ? ?Dermatology: No open lesions bilateral lower extremities, no webspace macerations, no ecchymosis bilateral, all nails x 10 are short and thick but well manicured. ? ?Vascular: Dorsalis Pedis and Posterior Tibial pedal pulses palpable faintly, Capillary Fill Time 5 seconds,(+) pedal hair growth bilateral, trace edema noted to ankles slightly worse on the left greater than right ankle, Temperature  gradient within normal limits. ? ?Neurology: Gross sensation intact via light touch bilateral. ? ?Musculoskeletal: Mild to moderate tenderness with palpation at dorsal lateral left ankle and foot near the sinus tarsi.  Strength is slightly diminished due to guarding secondary to pain on the left ankle. ? ?Xrays  ?Left ankle ?  Impression: No acute fracture or dislocation soft tissue swelling localized to the ankle ? ?Assessment and Plan: ?Problem List Items Addressed This Visit   ?None ?Visit Diagnoses   ? ? Tendonitis of ankle    -  Primary  ? Relevant Medications  ? triamcinolone acetonide (KENALOG) 10 MG/ML injection 10 mg (Completed)  ? Other Relevant Orders  ? DG Ankle Complete Left  ? Acute left ankle pain      ? Capsulitis of ankle, left      ? Relevant Medications  ? triamcinolone acetonide (KENALOG) 10 MG/ML injection 10 mg (Completed)  ? Type 2 diabetes mellitus with diabetic neuropathy, unspecified whether long term insulin use ([place])      ? Relevant Medications  ? olmesartan (BENICAR) 20 MG tablet  ? Sinus tarsi syndrome of left ankle      ? ?  ? ? ? ? ?-Complete examination performed ?-Xrays reviewed ?-Discussed treatment options for likely tendinitis versus sinus tarsi syndrome secondary to foot type possibly overuse from patient doing more walking and standing recently ?-After oral consent and aseptic prep, injected a mixture containing 1 ml of 2%  ?plain lidocaine, 1 ml 0.5% plain marcaine, 0.5 ml of kenalog 10 and 0.5 ml of dexamethasone phosphate into left dorsal lateral foot and ankle over the extensor tendon complex and sinus tarsi without complication. Post-injection care discussed with patient.  ?-Recommend rest ice elevation and gentle compression with Surgigrip compression sleeve as dispensed this visit ?-Advised patient to limit activities to tolerance to prevent worsening of symptoms ?-Advised patient to contact office sooner if symptoms fail to continue to improve and may return in 3 weeks  for an additional follow-up postinjection. ? ?[name], DPM ? ?

## 2021-06-09 DIAGNOSIS — I129 Hypertensive chronic kidney disease with stage 1 through stage 4 chronic kidney disease, or unspecified chronic kidney disease: Secondary | ICD-10-CM

## 2021-06-09 DIAGNOSIS — N1832 Chronic kidney disease, stage 3b: Secondary | ICD-10-CM | POA: Diagnosis not present

## 2021-06-09 DIAGNOSIS — E1122 Type 2 diabetes mellitus with diabetic chronic kidney disease: Secondary | ICD-10-CM

## 2021-06-09 DIAGNOSIS — E782 Mixed hyperlipidemia: Secondary | ICD-10-CM

## 2021-06-09 DIAGNOSIS — Z794 Long term (current) use of insulin: Secondary | ICD-10-CM

## 2021-06-13 ENCOUNTER — Ambulatory Visit: Payer: Medicare Other | Admitting: Sports Medicine

## 2021-06-13 ENCOUNTER — Encounter: Payer: Self-pay | Admitting: Sports Medicine

## 2021-06-13 DIAGNOSIS — M7752 Other enthesopathy of left foot: Secondary | ICD-10-CM

## 2021-06-13 DIAGNOSIS — E1140 Type 2 diabetes mellitus with diabetic neuropathy, unspecified: Secondary | ICD-10-CM

## 2021-06-13 DIAGNOSIS — M25572 Pain in left ankle and joints of left foot: Secondary | ICD-10-CM

## 2021-06-13 DIAGNOSIS — M7750 Other enthesopathy of unspecified foot: Secondary | ICD-10-CM

## 2021-06-13 NOTE — Progress Notes (Signed)
Subjective: ? ?Teresa Stanley is a 76 y.o. female patient who presents to office for follow-up evaluation of left ankle pain. Patient reports that things are feeling some better after the injection but still has some swelling and some occasional pain.  Patient is assisted by daughter this visit.  Patient denies any other pedal complaints at this time.  States that she is icing and elevating is much as possible.  Patient is diabetic blood sugar not recorded at this visit.  No other pedal complaints noted. ? ?Patient Active Problem List  ? Diagnosis Date Noted  ? Class 3 severe obesity due to excess calories with serious comorbidity and body mass index (BMI) of 45.0 to 49.9 in adult [facility]) 05/28/2018  ? Hypertensive nephropathy 08/11/2017  ? Right knee DJD 11/26/2015  ? S/P total knee arthroplasty 04/18/2013  ? Unilateral primary osteoarthritis, right knee 04/04/2013  ?  Class: Diagnosis of  ? Candidal intertrigo 08/09/2012  ? Hypertension 12/15/2011  ? Diabetes mellitus with stage 3 chronic kidney disease ([facility]) 12/15/2011  ? ? ?Current Outpatient Medications on File Prior to Visit  ?Medication Sig Dispense Refill  ? amLODipine (NORVASC) 10 MG tablet TAKE 1 TABLET BY MOUTH EVERY DAY IN THE EVENING 90 tablet 2  ? aspirin 81 MG tablet Take 81 mg by mouth daily.    ? atorvastatin (LIPITOR) 20 MG tablet TAKE 1 TABLET BY MOUTH EVERY DAY 90 tablet 2  ? Blood Glucose Calibration (OT ULTRA/FASTTK CNTRL SOLN) SOLN     ? Blood Glucose Monitoring Suppl (ONE TOUCH ULTRA 2) w/Device KIT Use as directed to check blood sugars 2 times per day dx:e11.22 1 each 1  ? carboxymethylcellulose (REFRESH PLUS) 0.5 % SOLN Place 1 drop into both eyes daily.    ? chlorthalidone (HYGROTON) 25 MG tablet Take 25 mg by mouth daily.    ? cholecalciferol (VITAMIN D3) 25 MCG (1000 UNIT) tablet Take 1,000 Units by mouth daily.    ? famotidine (PEPCID) 20 MG tablet Take 20 mg by mouth daily.    ? gabapentin (NEURONTIN) 300 MG capsule Take 1 capsule (300 mg  total) by mouth at bedtime. 90 capsule 2  ? hydrALAZINE (APRESOLINE) 25 MG tablet TAKE 1 TABLET BY MOUTH TWICE A DAY 60 tablet 1  ? insulin degludec (TRESIBA FLEXTOUCH) 100 UNIT/ML SOPN FlexTouch Pen Inject 16 Units into the skin at bedtime.     ? Insulin Pen Needle 32G X 4 MM MISC USE WITH TRESIBA 100 each 10  ? LOKELMA 10 g PACK packet Take 10 g by mouth. Every other day    ? meclizine (ANTIVERT) 25 MG tablet TAKE 1 TABLET (25 MG TOTAL) BY MOUTH 3 (THREE) TIMES DAILY AS NEEDED FOR DIZZINESS. 30 tablet 0  ? methimazole (TAPAZOLE) 5 MG tablet TAKE 1 TABLET BY MOUTH EVERY DAY 90 tablet 1  ? nitrofurantoin (MACRODANTIN) 100 MG capsule Take 100 mg by mouth 2 (two) times daily.    ? olmesartan (BENICAR) 20 MG tablet Take 20 mg by mouth daily.    ? olmesartan (BENICAR) 40 MG tablet Take 1 tablet (40 mg total) by mouth daily. 90 tablet 2  ? OneTouch Delica Lancets 42A MISC Use as directed to check blood sugars 2 times per day dx: e11.22 300 each 2  ? ONETOUCH ULTRA test strip USE AS INSTRUCTED TO CHECK BLOOD SUGARS 2 TIMES PER DAY DX: E11.22 200 strip 4  ? propranolol ER (INDERAL LA) 120 MG 24 hr capsule TAKE 1 CAPSULE BY MOUTH  EVERY DAY 90 capsule 2  ? Semaglutide, 1 MG/DOSE, 4 MG/3ML SOPN Inject 1 mg into the skin once a week. 3 mL 2  ? sodium bicarbonate 650 MG tablet TAKE 1 TABLET BY MOUTH EVERY DAY 90 tablet 3  ? triamcinolone cream (KENALOG) 0.1 % Apply 1 application topically 2 (two) times daily. Apply 45 g 1  ? ?No current facility-administered medications on file prior to visit.  ? ? ?No Known Allergies ? ?Objective:  ?General: Alert and oriented x3 in no acute distress ? ?Dermatology: No open lesions bilateral lower extremities, no webspace macerations, no ecchymosis bilateral, all nails x 10 are short and thick but well manicured. ? ?Vascular: Dorsalis Pedis and Posterior Tibial pedal pulses palpable faintly, Capillary Fill Time 5 seconds,(+) pedal hair growth bilateral, trace edema noted to ankles slightly  worse on the left greater than right ankle, Temperature gradient within normal limits. ? ?Neurology: Gross sensation intact via light touch bilateral. ? ?Musculoskeletal: Mild to moderate tenderness with palpation at dorsal lateral left ankle and foot adjacent to sinus tarsi.  Strength is slightly diminished due to guarding secondary to pain on the left ankle however there is very minimal pain with range of motion only pain present is with inversion of the left foot and ankle. ? ? ?Assessment and Plan: ?Problem List Items Addressed This Visit   ?None ?Visit Diagnoses   ? ? Tendonitis of ankle    -  Primary  ? Capsulitis of ankle, left      ? Acute left ankle pain      ? Sinus tarsi syndrome of left ankle      ? Type 2 diabetes mellitus with diabetic neuropathy, unspecified whether long term insulin use ([place])      ? ?  ? ?-Complete examination performed ?-Discussed continued care for slowly improving left foot and ankle pain ?-Advised patient to continue with Surgigrip compression sleeve as tolerated for edema control ?-Dispense ankle gauntlet for patient to strap from medial to lateral to help further offload the lateral ankle and foot to help with pain and to provide additional stability ?-Advised good supportive shoes daily for foot type ?-Advised patient to limit activities to tolerance to prevent worsening of symptoms ?-Return to office in 3 weeks for follow-up evaluation of left ankle pain.  Advised patient if she is no better at next visit would recommend physical therapy. ? ?[name], DPM ? ?

## 2021-06-19 ENCOUNTER — Encounter: Payer: Self-pay | Admitting: Internal Medicine

## 2021-06-19 ENCOUNTER — Ambulatory Visit (INDEPENDENT_AMBULATORY_CARE_PROVIDER_SITE_OTHER): Payer: Medicare Other | Admitting: Internal Medicine

## 2021-06-19 ENCOUNTER — Telehealth: Payer: Self-pay

## 2021-06-19 VITALS — BP 134/70 | HR 84 | Temp 98.2°F | Ht 62.0 in | Wt 251.2 lb

## 2021-06-19 DIAGNOSIS — E782 Mixed hyperlipidemia: Secondary | ICD-10-CM | POA: Diagnosis not present

## 2021-06-19 DIAGNOSIS — N1832 Chronic kidney disease, stage 3b: Secondary | ICD-10-CM | POA: Diagnosis not present

## 2021-06-19 DIAGNOSIS — Z79899 Other long term (current) drug therapy: Secondary | ICD-10-CM

## 2021-06-19 DIAGNOSIS — R200 Anesthesia of skin: Secondary | ICD-10-CM

## 2021-06-19 DIAGNOSIS — Z0000 Encounter for general adult medical examination without abnormal findings: Secondary | ICD-10-CM

## 2021-06-19 DIAGNOSIS — I129 Hypertensive chronic kidney disease with stage 1 through stage 4 chronic kidney disease, or unspecified chronic kidney disease: Secondary | ICD-10-CM

## 2021-06-19 DIAGNOSIS — Z23 Encounter for immunization: Secondary | ICD-10-CM | POA: Diagnosis not present

## 2021-06-19 DIAGNOSIS — Z6841 Body Mass Index (BMI) 40.0 and over, adult: Secondary | ICD-10-CM

## 2021-06-19 DIAGNOSIS — E1122 Type 2 diabetes mellitus with diabetic chronic kidney disease: Secondary | ICD-10-CM | POA: Diagnosis not present

## 2021-06-19 DIAGNOSIS — Z794 Long term (current) use of insulin: Secondary | ICD-10-CM

## 2021-06-19 LAB — POCT URINALYSIS DIPSTICK
Bilirubin, UA: NEGATIVE
Blood, UA: NEGATIVE
Glucose, UA: NEGATIVE
Ketones, UA: NEGATIVE
Nitrite, UA: NEGATIVE
Protein, UA: POSITIVE — AB
Spec Grav, UA: 1.015 (ref 1.010–1.025)
Urobilinogen, UA: 0.2 E.U./dL
pH, UA: 7 (ref 5.0–8.0)

## 2021-06-19 NOTE — Chronic Care Management (AMB) (Addendum)
Fax received from [employer] patient assistance program, Tresiba 100 u/ml will be fulfilled on 06/27/2021 for 120 day supply and Ozempic 1 mg/ml- 1 pen will be fulfilled on 07/04/2021 120 day supply and should arrive to the office in 10-14 business days.  [name], [place] Pharmacist Assistant [phone]

## 2021-06-19 NOTE — Patient Instructions (Signed)

## 2021-06-19 NOTE — Progress Notes (Signed)
Rich Brave [place]leton,acting as a Education administrator for Maximino Greenland, MD.,have documented all relevant documentation on the behalf of Maximino Greenland, MD,as directed by  Maximino Greenland, MD while in the presence of Maximino Greenland, MD.  This visit occurred during the SARS-CoV-2 public health emergency.  Safety protocols were in place, including screening questions prior to the visit, additional usage of staff PPE, and extensive cleaning of exam room while observing appropriate contact time as indicated for disinfecting solutions.  Subjective:     Patient ID: Teresa Stanley , female    DOB: February 19, 1945 , 76 y.o.   MRN: [id number]   Chief Complaint  Patient presents with   Annual Exam   Diabetes   Hypertension    HPI  She presents today for HM. She reports compliance with meds. She has not had any issues with meds. She denies headaches , chest pain and shortness of breath. She states she has been trying to be more active in her home.   Diabetes She presents for her follow-up diabetic visit. She has type 2 diabetes mellitus. Her disease course has been stable. There are no hypoglycemic associated symptoms. Pertinent negatives for diabetes include no blurred vision and no chest pain. There are no hypoglycemic complications. Diabetic complications include nephropathy. Risk factors for coronary artery disease include diabetes mellitus, dyslipidemia, obesity, post-menopausal and sedentary lifestyle. She is compliant with treatment some of the time. Her weight is decreasing steadily. She is following a generally healthy diet. She participates in exercise intermittently. Her breakfast blood glucose is taken between 8-9 am. Her breakfast blood glucose range is generally 130-140 mg/dl. An ACE inhibitor/angiotensin II receptor blocker is being taken. Eye exam is current.  Hypertension The current episode started more than 1 year ago. The problem has been gradually improving since onset. The problem is uncontrolled.  Pertinent negatives include no blurred vision, chest pain, palpitations or shortness of breath. The current treatment provides moderate improvement. Compliance problems include exercise.  Hypertensive end-organ damage includes kidney disease.    Past Medical History:  Diagnosis Date   Arthritis    Diabetes mellitus    takes Actos and Metformin daily and Victoza   GERD (gastroesophageal reflux disease)    takes Omeprazole daily   Headache(784.0)    occasionally   Hyperlipidemia    takes Atorvastatin daily   Hypertension    takes Lisinopril,Amlodipine,and Metoprolol daily   Hypothyroidism    Joint pain    Thyroid disease    ?     Family History  Problem Relation Age of Onset   Diabetes Mother    Hypertension Mother    Diabetes Father    Asthma Father    Hypertension Father    Hypertension Daughter    Hypertension Sister        2   Diabetes Sister    Hypertension Brother        5   Kidney disease Brother    Colon cancer Neg Hx    Esophageal cancer Neg Hx    Rectal cancer Neg Hx    Stomach cancer Neg Hx      Current Outpatient Medications:    amLODipine (NORVASC) 10 MG tablet, TAKE 1 TABLET BY MOUTH EVERY DAY IN THE EVENING, Disp: 90 tablet, Rfl: 2   aspirin 81 MG tablet, Take 81 mg by mouth daily., Disp: , Rfl:    atorvastatin (LIPITOR) 20 MG tablet, TAKE 1 TABLET BY MOUTH EVERY DAY, Disp: 90 tablet, Rfl: 2  Blood Glucose Calibration (OT ULTRA/FASTTK CNTRL SOLN) SOLN, , Disp: , Rfl:    Blood Glucose Monitoring Suppl (ONE TOUCH ULTRA 2) w/Device KIT, Use as directed to check blood sugars 2 times per day dx:e11.22, Disp: 1 each, Rfl: 1   carboxymethylcellulose (REFRESH PLUS) 0.5 % SOLN, Place 1 drop into both eyes daily., Disp: , Rfl:    chlorthalidone (HYGROTON) 25 MG tablet, Take 25 mg by mouth daily., Disp: , Rfl:    cholecalciferol (VITAMIN D3) 25 MCG (1000 UNIT) tablet, Take 1,000 Units by mouth daily., Disp: , Rfl:    famotidine (PEPCID) 20 MG tablet, Take 20 mg  by mouth daily., Disp: , Rfl:    gabapentin (NEURONTIN) 300 MG capsule, Take 1 capsule (300 mg total) by mouth at bedtime., Disp: 90 capsule, Rfl: 2   hydrALAZINE (APRESOLINE) 25 MG tablet, TAKE 1 TABLET BY MOUTH TWICE A DAY, Disp: 60 tablet, Rfl: 1   insulin degludec (TRESIBA FLEXTOUCH) 100 UNIT/ML SOPN FlexTouch Pen, Inject 16 Units into the skin at bedtime. , Disp: , Rfl:    Insulin Pen Needle 32G X 4 MM MISC, USE WITH TRESIBA, Disp: 100 each, Rfl: 10   LOKELMA 10 g PACK packet, Take 10 g by mouth. Every other day, Disp: , Rfl:    meclizine (ANTIVERT) 25 MG tablet, TAKE 1 TABLET (25 MG TOTAL) BY MOUTH 3 (THREE) TIMES DAILY AS NEEDED FOR DIZZINESS., Disp: 30 tablet, Rfl: 0   methimazole (TAPAZOLE) 5 MG tablet, TAKE 1 TABLET BY MOUTH EVERY DAY, Disp: 90 tablet, Rfl: 1   nitrofurantoin (MACRODANTIN) 100 MG capsule, Take 100 mg by mouth 2 (two) times daily., Disp: , Rfl:    olmesartan (BENICAR) 20 MG tablet, Take 20 mg by mouth daily., Disp: , Rfl:    olmesartan (BENICAR) 40 MG tablet, Take 1 tablet (40 mg total) by mouth daily., Disp: 90 tablet, Rfl: 2   OneTouch Delica Lancets 62H MISC, Use as directed to check blood sugars 2 times per day dx: e11.22, Disp: 300 each, Rfl: 2   ONETOUCH ULTRA test strip, USE AS INSTRUCTED TO CHECK BLOOD SUGARS 2 TIMES PER DAY DX: E11.22, Disp: 200 strip, Rfl: 4   propranolol ER (INDERAL LA) 120 MG 24 hr capsule, TAKE 1 CAPSULE BY MOUTH EVERY DAY, Disp: 90 capsule, Rfl: 2   Semaglutide, 1 MG/DOSE, 4 MG/3ML SOPN, Inject 1 mg into the skin once a week., Disp: 3 mL, Rfl: 2   sodium bicarbonate 650 MG tablet, TAKE 1 TABLET BY MOUTH EVERY DAY, Disp: 90 tablet, Rfl: 3   triamcinolone cream (KENALOG) 0.1 %, Apply 1 application topically 2 (two) times daily. Apply, Disp: 45 g, Rfl: 1   No Known Allergies    The patient states she uses post menopausal status for birth control. Last LMP was No LMP recorded. Patient has had a hysterectomy.. Negative for Dysmenorrhea.  Negative for: breast discharge, breast lump(s), breast pain and breast self exam. Associated symptoms include abnormal vaginal bleeding. Pertinent negatives include abnormal bleeding (hematology), anxiety, decreased libido, depression, difficulty falling sleep, dyspareunia, history of infertility, nocturia, sexual dysfunction, sleep disturbances, urinary incontinence, urinary urgency, vaginal discharge and vaginal itching. Diet regular.The patient states her exercise level is  intermittent, but she is gradually increasing frequency and has noticed she is more mobile.   . The patient's tobacco use is:  Social History   Tobacco Use  Smoking Status Never   Passive exposure: Never  Smokeless Tobacco Never  . She has been exposed to passive  smoke. The patient's alcohol use is:  Social History   Substance and Sexual Activity  Alcohol Use No    Review of Systems  Constitutional: Negative.   HENT: Negative.    Eyes: Negative.  Negative for blurred vision.  Respiratory: Negative.  Negative for shortness of breath.   Cardiovascular: Negative.  Negative for chest pain and palpitations.  Gastrointestinal: Negative.   Endocrine: Negative.   Genitourinary: Negative.   Musculoskeletal: Negative.   Skin: Negative.   Allergic/Immunologic: Negative.   Neurological:  Positive for numbness.  Hematological: Negative.   Psychiatric/Behavioral: Negative.      Today's Vitals   06/19/21 1130  BP: 134/70  Pulse: 84  Temp: 98.2 F (36.8 C)  Weight: 251 lb 3.2 oz (113.9 kg)  Height: '5\' 2"'  (1.575 m)  PainSc: 0-No pain   Body mass index is 45.95 kg/m.  Wt Readings from Last 3 Encounters:  06/19/21 251 lb 3.2 oz (113.9 kg)  04/03/21 265 lb (120.2 kg)  03/21/21 269 lb (122 kg)    Objective:  Physical Exam Constitutional:      General: She is not in acute distress.    Appearance: Normal appearance. She is well-developed. She is obese.  HENT:     Head: Normocephalic and atraumatic.     Right  Ear: Hearing, tympanic membrane, ear canal and external ear normal. There is no impacted cerumen.     Left Ear: Hearing, tympanic membrane, ear canal and external ear normal. There is no impacted cerumen.     Nose:     Comments: Deferred - masked    Mouth/Throat:     Comments: Deferred - masked Eyes:     General: Lids are normal.     Extraocular Movements: Extraocular movements intact.     Conjunctiva/sclera: Conjunctivae normal.     Pupils: Pupils are equal, round, and reactive to light.     Funduscopic exam:    Right eye: No papilledema.        Left eye: No papilledema.  Neck:     Thyroid: No thyroid mass.     Vascular: No carotid bruit.  Cardiovascular:     Rate and Rhythm: Normal rate and regular rhythm.     Pulses:          Dorsalis pedis pulses are 1+ on the right side and 1+ on the left side.     Heart sounds: Normal heart sounds. No murmur heard. Pulmonary:     Effort: Pulmonary effort is normal.     Breath sounds: Normal breath sounds.  Chest:     Chest wall: No mass.  Breasts:    Tanner Score is 5.     Right: Normal. No mass or tenderness.     Left: Normal. No mass or tenderness.  Abdominal:     General: Bowel sounds are normal. There is no distension.     Palpations: Abdomen is soft.     Tenderness: There is no abdominal tenderness.     Comments: Obese, soft. Difficult to assess organomegaly.   Genitourinary:    Rectum: Guaiac result negative.  Musculoskeletal:        General: No swelling. Normal range of motion.     Cervical back: Full passive range of motion without pain, normal range of motion and neck supple.     Right lower leg: Edema present.     Left lower leg: Edema present.     Comments: Neg Tinel's, Phalen's signs b/l  Feet:  Right foot:     Protective Sensation: 5 sites tested.  5 sites sensed.     Skin integrity: Callus and dry skin present.     Toenail Condition: Right toenails are normal.     Left foot:     Protective Sensation: 5 sites  tested.  5 sites sensed.     Skin integrity: Callus and dry skin present.     Toenail Condition: Left toenails are normal.  Lymphadenopathy:     Upper Body:     Right upper body: No supraclavicular, axillary or pectoral adenopathy.     Left upper body: No supraclavicular, axillary or pectoral adenopathy.  Skin:    General: Skin is warm and dry.     Capillary Refill: Capillary refill takes less than 2 seconds.  Neurological:     General: No focal deficit present.     Mental Status: She is alert and oriented to person, place, and time.     Cranial Nerves: No cranial nerve deficit.     Sensory: No sensory deficit.  Psychiatric:        Mood and Affect: Mood normal.        Behavior: Behavior normal.        Thought Content: Thought content normal.        Judgment: Judgment normal.     Assessment And Plan:     1. Encounter for general adult medical examination w/o abnormal findings Comments: A full exam was performed. Importance of monthly self breast exams was discussed with the patient. PATIENT IS ADVISED TO GET 30-45 MINUTES REGULAR EXERCISE NO LESS THAN FOUR TO FIVE DAYS PER WEEK - BOTH WEIGHTBEARING EXERCISES AND AEROBIC ARE RECOMMENDED.  PATIENT IS ADVISED TO FOLLOW A HEALTHY DIET WITH AT LEAST SIX FRUITS/VEGGIES PER DAY, DECREASE INTAKE OF RED MEAT, AND TO INCREASE FISH INTAKE TO TWO DAYS PER WEEK.  MEATS/FISH SHOULD NOT BE FRIED, BAKED OR BROILED IS PREFERABLE.  IT IS ALSO IMPORTANT TO CUT BACK ON YOUR SUGAR INTAKE. PLEASE AVOID ANYTHING WITH ADDED SUGAR, CORN SYRUP OR OTHER SWEETENERS. IF YOU MUST USE A SWEETENER, YOU CAN TRY STEVIA. IT IS ALSO IMPORTANT TO AVOID ARTIFICIALLY SWEETENERS AND DIET BEVERAGES. LASTLY, I SUGGEST WEARING SPF 50 SUNSCREEN ON EXPOSED PARTS AND ESPECIALLY WHEN IN THE DIRECT SUNLIGHT FOR AN EXTENDED PERIOD OF TIME.  PLEASE AVOID FAST FOOD RESTAURANTS AND INCREASE YOUR WATER INTAKE.  2. Type 2 diabetes mellitus with stage 3b chronic kidney disease, with long-term  current use of insulin ([place]) Comments: Diabetic foot exam was performed. She will f/u in 4 months. She was congratulated on her lifestyle changes. I DISCUSSED WITH THE PATIENT AT LENGTH REGARDING THE GOALS OF GLYCEMIC CONTROL AND POSSIBLE LONG-TERM COMPLICATIONS.  I  ALSO STRESSED THE IMPORTANCE OF COMPLIANCE WITH HOME GLUCOSE MONITORING, DIETARY RESTRICTIONS INCLUDING AVOIDANCE OF SUGARY DRINKS/PROCESSED FOODS,  ALONG WITH REGULAR EXERCISE.  I  ALSO STRESSED THE IMPORTANCE OF ANNUAL EYE EXAMS, SELF FOOT CARE AND COMPLIANCE WITH OFFICE VISITS.  - Microalbumin / Creatinine Urine Ratio - Hemoglobin A1c  3. Parenchymal renal hypertension, stage 1 through stage 4 or unspecified chronic kidney disease Comments: Chronic, fair control. Goal BP<130/80. No med changes today. EKG performed, NSR w/ LAFB.  She is encouraged to follow a low sodium diet.  - POCT Urinalysis Dipstick (81002) - EKG 12-Lead - CBC - CMP14+EGFR  4. Mixed hyperlipidemia Comments: Chronic, she is on atorvastatin 31m daily. LDL goal <70. Advised to avoid fried foods, increase activity, and to increase her  fiber/fish intake.  - Lipid panel  5. Numbness in both hands Comments: Possibly due to diabetic neuropathy. A vitamin B12 level has been checked in the past, this was normal Feb 2023. Increase gabapentin to BID dosing.   6. Class 3 severe obesity due to excess calories with serious comorbidity and body mass index (BMI) of 45.0 to 49.9 in adult [facility]) Comments: BMI 45. She was congratulated on her 14lb weight loss in the past 3 months. She is encouraged to keep up her efforts!  7. Immunization due - Pneumococcal conjugate vaccine 20-valent (Prevnar 20)  Patient was given opportunity to ask questions. Patient verbalized understanding of the plan and was able to repeat key elements of the plan. All questions were answered to their satisfaction.   I, Maximino Greenland, MD, have reviewed all documentation for this visit. The  documentation on 06/19/21 for the exam, diagnosis, procedures, and orders are all accurate and complete.   THE PATIENT IS ENCOURAGED TO PRACTICE SOCIAL DISTANCING DUE TO THE COVID-19 PANDEMIC.

## 2021-06-30 ENCOUNTER — Other Ambulatory Visit: Payer: Self-pay | Admitting: Internal Medicine

## 2021-07-04 ENCOUNTER — Ambulatory Visit: Payer: Medicare Other | Admitting: Sports Medicine

## 2021-07-04 DIAGNOSIS — E1140 Type 2 diabetes mellitus with diabetic neuropathy, unspecified: Secondary | ICD-10-CM

## 2021-07-04 DIAGNOSIS — M7750 Other enthesopathy of unspecified foot: Secondary | ICD-10-CM

## 2021-07-04 DIAGNOSIS — M7752 Other enthesopathy of left foot: Secondary | ICD-10-CM | POA: Diagnosis not present

## 2021-07-04 DIAGNOSIS — M25572 Pain in left ankle and joints of left foot: Secondary | ICD-10-CM

## 2021-07-04 NOTE — Progress Notes (Signed)
Subjective:  KAIZLEY AJA is a 76 y.o. female patient who presents to office for follow-up evaluation of left ankle pain. Patient reports that things are much better currently has no pain but just has occasional swelling.  Patient is assisted by daughter this visit.  Fasting blood sugar not recorded.  Patient Active Problem List   Diagnosis Date Noted   Class 3 severe obesity due to excess calories with serious comorbidity and body mass index (BMI) of 45.0 to 49.9 in adult [facility]) 05/28/2018   Hypertensive nephropathy 08/11/2017   Right knee DJD 11/26/2015   S/P total knee arthroplasty 04/18/2013   Unilateral primary osteoarthritis, right knee 04/04/2013    Class: Diagnosis of   Candidal intertrigo 08/09/2012   Hypertension 12/15/2011   Diabetes mellitus with stage 3 chronic kidney disease ([place]) 12/15/2011    Current Outpatient Medications on File Prior to Visit  Medication Sig Dispense Refill   amLODipine (NORVASC) 10 MG tablet TAKE 1 TABLET BY MOUTH EVERY DAY IN THE EVENING 90 tablet 2   aspirin 81 MG tablet Take 81 mg by mouth daily.     atorvastatin (LIPITOR) 20 MG tablet TAKE 1 TABLET BY MOUTH EVERY DAY 90 tablet 2   BD PEN NEEDLE NANO 2ND GEN 32G X 4 MM MISC USE WITH TRESIBA 100 each 10   Blood Glucose Calibration (OT ULTRA/FASTTK CNTRL SOLN) SOLN      Blood Glucose Monitoring Suppl (ONE TOUCH ULTRA 2) w/Device KIT Use as directed to check blood sugars 2 times per day dx:e11.22 1 each 1   carboxymethylcellulose (REFRESH PLUS) 0.5 % SOLN Place 1 drop into both eyes daily.     chlorthalidone (HYGROTON) 25 MG tablet Take 25 mg by mouth daily.     cholecalciferol (VITAMIN D3) 25 MCG (1000 UNIT) tablet Take 1,000 Units by mouth daily.     famotidine (PEPCID) 20 MG tablet Take 20 mg by mouth daily.     gabapentin (NEURONTIN) 300 MG capsule Take 1 capsule (300 mg total) by mouth at bedtime. 90 capsule 2   hydrALAZINE (APRESOLINE) 25 MG tablet TAKE 1 TABLET BY MOUTH TWICE A DAY 60  tablet 1   insulin degludec (TRESIBA FLEXTOUCH) 100 UNIT/ML SOPN FlexTouch Pen Inject 16 Units into the skin at bedtime.      LOKELMA 10 g PACK packet Take 10 g by mouth. Every other day     meclizine (ANTIVERT) 25 MG tablet TAKE 1 TABLET (25 MG TOTAL) BY MOUTH 3 (THREE) TIMES DAILY AS NEEDED FOR DIZZINESS. 30 tablet 0   methimazole (TAPAZOLE) 5 MG tablet TAKE 1 TABLET BY MOUTH EVERY DAY 90 tablet 1   nitrofurantoin (MACRODANTIN) 100 MG capsule Take 100 mg by mouth 2 (two) times daily.     olmesartan (BENICAR) 20 MG tablet Take 20 mg by mouth daily.     olmesartan (BENICAR) 40 MG tablet Take 1 tablet (40 mg total) by mouth daily. 90 tablet 2   OneTouch Delica Lancets 31D MISC Use as directed to check blood sugars 2 times per day dx: e11.22 300 each 2   ONETOUCH ULTRA test strip USE AS INSTRUCTED TO CHECK BLOOD SUGARS 2 TIMES PER DAY DX: E11.22 200 strip 4   propranolol ER (INDERAL LA) 120 MG 24 hr capsule TAKE 1 CAPSULE BY MOUTH EVERY DAY 90 capsule 2   Semaglutide, 1 MG/DOSE, 4 MG/3ML SOPN Inject 1 mg into the skin once a week. 3 mL 2   sodium bicarbonate 650 MG tablet TAKE 1  TABLET BY MOUTH EVERY DAY 90 tablet 3   triamcinolone cream (KENALOG) 0.1 % Apply 1 application topically 2 (two) times daily. Apply 45 g 1   No current facility-administered medications on file prior to visit.    No Known Allergies  Objective:  General: Alert and oriented x3 in no acute distress  Dermatology: No open lesions bilateral lower extremities, no webspace macerations, no ecchymosis bilateral, all nails x 10 are short and thick but well manicured.  Vascular: Dorsalis Pedis and Posterior Tibial pedal pulses palpable faintly, Capillary Fill Time 5 seconds,(+) pedal hair growth bilateral, trace edema noted to ankles bilateral, temperature gradient within normal limits.  Neurology: Johney Maine sensation intact via light touch bilateral.  Musculoskeletal: No reproducible tenderness with palpation at dorsal lateral  left ankle and foot adjacent to sinus tarsi.  Strength appears to be acceptable bilateral.  Pes planus foot type.   Assessment and Plan: Problem List Items Addressed This Visit   None Visit Diagnoses     Tendonitis of ankle    -  Primary   Capsulitis of ankle, left       Acute left ankle pain       Sinus tarsi syndrome of left ankle       Type 2 diabetes mellitus with diabetic neuropathy, unspecified whether long term insulin use (HCC)          -Complete examination performed -Discussed continued care for resolved tendinitis secondary to foot type -Advised patient to slowly wean as directed from ankle gauntlet brace -Encouraged good supportive shoes daily for foot type and range of motion exercises -Return to office if symptoms fail to continue to improve or worsen.  [name], DPM

## 2021-07-10 ENCOUNTER — Telehealth: Payer: Self-pay

## 2021-07-10 ENCOUNTER — Other Ambulatory Visit: Payer: Self-pay | Admitting: Internal Medicine

## 2021-07-10 ENCOUNTER — Other Ambulatory Visit: Payer: Medicare Other

## 2021-07-10 DIAGNOSIS — E782 Mixed hyperlipidemia: Secondary | ICD-10-CM

## 2021-07-10 DIAGNOSIS — I129 Hypertensive chronic kidney disease with stage 1 through stage 4 chronic kidney disease, or unspecified chronic kidney disease: Secondary | ICD-10-CM

## 2021-07-10 DIAGNOSIS — E1122 Type 2 diabetes mellitus with diabetic chronic kidney disease: Secondary | ICD-10-CM

## 2021-07-10 NOTE — Chronic Care Management (AMB) (Cosign Needed Addendum)
Teresa Stanley arrived from [employer] patient assistance program, patient aware and will have son come pick up today.   [employer] patient assistance program notification:  120- day supply of Tresiba 100 u/ml  should arrive to the office in 10-14 business days. Patient has 1  refill remaining and enrollment will expire on 01/09/2022.  The next refill for patient will be fulfilled on 09/15/2021.   [name], [place] Pharmacist Assistant [phone]

## 2021-07-11 ENCOUNTER — Other Ambulatory Visit: Payer: Medicare Other

## 2021-07-15 LAB — CBC
Hematocrit: 35.6 % (ref 34.0–46.6)
Hemoglobin: 11.6 g/dL (ref 11.1–15.9)
MCH: 29.1 pg (ref 26.6–33.0)
MCHC: 32.6 g/dL (ref 31.5–35.7)
MCV: 89 fL (ref 79–97)
Platelets: 270 10*3/uL (ref 150–450)
RBC: 3.98 x10E6/uL (ref 3.77–5.28)
RDW: 13.3 % (ref 11.7–15.4)
WBC: 7.2 10*3/uL (ref 3.4–10.8)

## 2021-07-15 LAB — LIPID PANEL
Chol/HDL Ratio: 2.7 ratio (ref 0.0–4.4)
Cholesterol, Total: 165 mg/dL (ref 100–199)
HDL: 62 mg/dL (ref >39)
LDL Chol Calc (NIH): 86 mg/dL (ref 0–99)
Triglycerides: 94 mg/dL (ref 0–149)
VLDL Cholesterol Cal: 17 mg/dL (ref 5–40)

## 2021-07-15 LAB — CMP14+EGFR
ALT: 11 IU/L (ref 0–32)
AST: 16 IU/L (ref 0–40)
Albumin/Globulin Ratio: 1.3 (ref 1.2–2.2)
Albumin: 3.8 g/dL (ref 3.7–4.7)
Alkaline Phosphatase: 116 IU/L (ref 44–121)
BUN/Creatinine Ratio: 20 (ref 12–28)
BUN: 33 mg/dL — ABNORMAL HIGH (ref 8–27)
Bilirubin Total: 0.3 mg/dL (ref 0.0–1.2)
CO2: 24 mmol/L (ref 20–29)
Calcium: 9.1 mg/dL (ref 8.7–10.3)
Chloride: 98 mmol/L (ref 96–106)
Creatinine, Ser: 1.63 mg/dL — ABNORMAL HIGH (ref 0.57–1.00)
Globulin, Total: 2.9 g/dL (ref 1.5–4.5)
Glucose: 148 mg/dL — ABNORMAL HIGH (ref 70–99)
Potassium: 4.8 mmol/L (ref 3.5–5.2)
Sodium: 136 mmol/L (ref 134–144)
Total Protein: 6.7 g/dL (ref 6.0–8.5)
eGFR: 33 mL/min/{1.73_m2} — ABNORMAL LOW (ref >59)

## 2021-07-15 LAB — HEMOGLOBIN A1C
Est. average glucose Bld gHb Est-mCnc: 148 mg/dL
Hgb A1c MFr Bld: 6.8 % — ABNORMAL HIGH (ref 4.8–5.6)

## 2021-07-15 LAB — MICROALBUMIN / CREATININE URINE RATIO

## 2021-07-16 ENCOUNTER — Ambulatory Visit (INDEPENDENT_AMBULATORY_CARE_PROVIDER_SITE_OTHER): Payer: Medicare Other | Admitting: Internal Medicine

## 2021-07-16 ENCOUNTER — Telehealth: Payer: Self-pay

## 2021-07-16 VITALS — BP 118/78 | Temp 98.1°F | Ht 62.0 in | Wt 251.0 lb

## 2021-07-16 DIAGNOSIS — Z23 Encounter for immunization: Secondary | ICD-10-CM

## 2021-07-16 NOTE — Telephone Encounter (Signed)
CARE PLAN ENTRY (see longitudinal plan of care for additional care plan information)  Current Barriers:  Financial Barriers: patient has [facility] ACO insurance and reports copay for Ozempic is cost prohibitive at this time  Pharmacist Clinical Goal(s):  Over the next 30 days, patient will work with PharmD and providers to relieve medication access concerns  Interventions: Comprehensive medication review completed; medication list updated in electronic medical record.  Inter-disciplinary care team collaboration (see longitudinal plan of care)  Patient meets income/and out of pocket spend criteria for this medication's patient assistance program. Reviewed application process.  Patient will provide proof of income, out of pocket spend report, and will sign application.  Will collaborate with primary care provider Dr. Baird Cancer for their portion of application.  Application was submitted and approved by [employer].   Patient Self Care Activities:  Patients Ozempic came into the office today for her to pick dosage is 1 mg once per week. Four boxes were delivered.  Called patient to let her know that her medication came in.  Patient voiced understanding.   [name], CPP, PharmD Clinical Pharmacist Practitioner [facility] [phone]

## 2021-07-16 NOTE — Progress Notes (Signed)
Pt presents for second shingrix.  

## 2021-07-23 ENCOUNTER — Telehealth: Payer: Self-pay

## 2021-07-23 NOTE — Chronic Care Management (AMB) (Signed)
[employer] patient assistance program notification:  120- day supply of Ozempic 1 mg was filled on 07/08/2021 and should arrive to the office in 10-14 business days. Patient has 1  refill remaining and enrollment will expire on 01/09/2022.  The next refill for patient will be fulfilled on 09/22/2021.  [name], [place] Pharmacist Assistant [phone]

## 2021-07-26 ENCOUNTER — Other Ambulatory Visit: Payer: Self-pay | Admitting: Internal Medicine

## 2021-07-26 DIAGNOSIS — Z1231 Encounter for screening mammogram for malignant neoplasm of breast: Secondary | ICD-10-CM

## 2021-07-31 ENCOUNTER — Ambulatory Visit
Admission: RE | Admit: 2021-07-31 | Discharge: 2021-07-31 | Disposition: A | Discharge status: Home / Self Care | Payer: Medicare Other | Source: Ambulatory Visit | Attending: Internal Medicine | Admitting: Internal Medicine

## 2021-07-31 DIAGNOSIS — Z1231 Encounter for screening mammogram for malignant neoplasm of breast: Secondary | ICD-10-CM | POA: Diagnosis not present

## 2021-08-19 ENCOUNTER — Telehealth: Payer: Self-pay

## 2021-08-19 NOTE — Chronic Care Management (AMB) (Signed)
Chronic Care Management Pharmacy Assistant   Name: Teresa Stanley  MRN: [id number] DOB: March 14, 1945   Reason for Encounter: Disease State/ Diabetes  Recent office visits:  07-16-2021 Teresa Stanley, Teresa Stanley. Shingrix injection given.  06-19-2021 Teresa Chard, MD. Abnormal UA.  Recent consult visits:  07-04-2021 Teresa Stanley, DPM (Podiatry). Follow up visit for swollen left ankle.  06-13-2021 Teresa Stanley, DPM (Podiatry). Follow up visit for swollen left ankle.  05-23-2021  Teresa Stanley, DPM (Podiatry). Follow up visit for swollen left ankle. DG Ankle Complete Left completed. Kenelog injection given.  Hospital visits:  Medication Reconciliation was completed by comparing discharge summary, patient's EMR and Pharmacy list, and upon discussion with patient.  Admitted to the hospital on 03-20-2021 due to Viral upper respiratory tract infection. Discharge date was 03-20-2021. Discharged from [facility].   New?Medications Started at [facility] Discharge:?? None  Medication Changes at Hospital Discharge: None  Medications Discontinued at Hospital Discharge: None  Medications that remain the same after Hospital Discharge:??  -All other medications will remain the same.   Hospital visits:  Medication Reconciliation was completed by comparing discharge summary, patient's EMR and Pharmacy list, and upon discussion with patient.   Admitted to the hospital on 03-11-2021 due to Laceration of left index finger without foreign body, nail damage status unspecified . Discharge date was 03-11-2021. Discharged from [facility]. Positive covid test   New?Medications Started at [facility] Discharge:?? None   Medication Changes at Hospital Discharge: None   Medications Discontinued at Hospital Discharge: None   Medications that remain the same after Hospital Discharge:??  -All other medications will remain the same.       Medications: Outpatient  Encounter Medications as of 08/19/2021  Medication Sig   amLODipine (NORVASC) 10 MG tablet TAKE 1 TABLET BY MOUTH EVERY DAY IN THE EVENING   aspirin 81 MG tablet Take 81 mg by mouth daily.   atorvastatin (LIPITOR) 20 MG tablet TAKE 1 TABLET BY MOUTH EVERY DAY   BD PEN NEEDLE NANO 2ND GEN 32G X 4 MM MISC USE WITH TRESIBA   Blood Glucose Calibration (OT ULTRA/FASTTK CNTRL SOLN) SOLN    Blood Glucose Monitoring Suppl (ONE TOUCH ULTRA 2) w/Device KIT Use as directed to check blood sugars 2 times per day dx:e11.22   carboxymethylcellulose (REFRESH PLUS) 0.5 % SOLN Place 1 drop into both eyes daily.   chlorthalidone (HYGROTON) 25 MG tablet Take 25 mg by mouth daily.   cholecalciferol (VITAMIN D3) 25 MCG (1000 UNIT) tablet Take 1,000 Units by mouth daily.   famotidine (PEPCID) 20 MG tablet Take 20 mg by mouth daily.   gabapentin (NEURONTIN) 300 MG capsule Take 1 capsule (300 mg total) by mouth at bedtime.   hydrALAZINE (APRESOLINE) 25 MG tablet TAKE 1 TABLET BY MOUTH TWICE A DAY   insulin degludec (TRESIBA FLEXTOUCH) 100 UNIT/ML SOPN FlexTouch Pen Inject 16 Units into the skin at bedtime.    LOKELMA 10 g PACK packet Take 10 g by mouth. Every other day   meclizine (ANTIVERT) 25 MG tablet TAKE 1 TABLET (25 MG TOTAL) BY MOUTH 3 (THREE) TIMES DAILY AS NEEDED FOR DIZZINESS.   methimazole (TAPAZOLE) 5 MG tablet TAKE 1 TABLET BY MOUTH EVERY DAY   nitrofurantoin (MACRODANTIN) 100 MG capsule Take 100 mg by mouth 2 (two) times daily.   olmesartan (BENICAR) 20 MG tablet Take 20 mg by mouth daily.   olmesartan (BENICAR) 40 MG tablet Take 1 tablet (40 mg total) by mouth  daily.   OneTouch Delica Lancets 33G MISC Use as directed to check blood sugars 2 times per day dx: e11.22   ONETOUCH ULTRA test strip USE AS INSTRUCTED TO CHECK BLOOD SUGARS 2 TIMES PER DAY DX: E11.22   propranolol ER (INDERAL LA) 120 MG 24 hr capsule TAKE 1 CAPSULE BY MOUTH EVERY DAY   Semaglutide, 1 MG/DOSE, 4 MG/3ML SOPN Inject 1 mg into the  skin once a week.   sodium bicarbonate 650 MG tablet TAKE 1 TABLET BY MOUTH EVERY DAY   triamcinolone cream (KENALOG) 0.1 % Apply 1 application topically 2 (two) times daily. Apply   No facility-administered encounter medications on file as of 08/19/2021.  Recent Relevant Labs: Lab Results  Component Value Date/Time   HGBA1C 6.8 (H) 07/10/2021 12:00 AM   HGBA1C 7.4 (H) 04/03/2021 10:51 AM   MICROALBUR 150 06/18/2020 12:27 PM   MICROALBUR 80 06/16/2019 02:39 PM    Kidney Function Lab Results  Component Value Date/Time   CREATININE 1.63 (H) 07/10/2021 12:00 AM   CREATININE 1.90 (H) 04/03/2021 10:51 AM   CREATININE 1.00 (H) 07/02/2015 07:10 PM   CREATININE 1.00 07/17/2013 01:49 PM   GFR 39.42 (L) 12/25/2017 03:08 PM   GFRNONAA 38 (L) 03/22/2020 11:12 AM   GFRNONAA 58 (L) 07/02/2015 07:10 PM   GFRAA 43 (L) 03/22/2020 11:12 AM   GFRAA 67 07/02/2015 07:10 PM    Current antihyperglycemic regimen:  Ozempic 1 mg weekly Tresiba 100 unit/ml - inject 16 units nightly.  What recent interventions/DTPs have been made to improve glycemic control:  Educated on Complications of diabetes including kidney damage, retinal damage, and cardiovascular disease; -Counseled to check feet daily and get yearly eye exams -Recommended to continue current medication  Have there been any recent hospitalizations or ED visits since last visit with CPP? No  Patient denies hypoglycemic symptoms  Patient denies hyperglycemic symptoms  How often are you checking your blood sugar? once daily  What are your blood sugars ranging?  Fasting: 111, 96, 103, 130 Before meals: None After meals: None Bedtime: None  During the week, how often does your blood glucose drop below 70? Never  Are you checking your feet daily/regularly? Daily  Adherence Review: Is the patient currently on a STATIN medication? Yes Is the patient currently on ACE/ARB medication? Yes Does the patient have >5 day gap between last  estimated fill dates? No   Care Gaps: Covid booster overdue  Star Rating Drugs: Ozempic 1 mg- Patient assistance Atorvastatin 20 mg- Last filled 07-16-2021 [facility] Olmesartan 40 mg- Last filled 07-07-2021  [facility]  Malecca Hicks CMA Clinical Pharmacist Assistant [phone]  

## 2021-08-26 DIAGNOSIS — N1832 Chronic kidney disease, stage 3b: Secondary | ICD-10-CM | POA: Diagnosis not present

## 2021-09-02 DIAGNOSIS — I129 Hypertensive chronic kidney disease with stage 1 through stage 4 chronic kidney disease, or unspecified chronic kidney disease: Secondary | ICD-10-CM | POA: Diagnosis not present

## 2021-09-02 DIAGNOSIS — R809 Proteinuria, unspecified: Secondary | ICD-10-CM | POA: Diagnosis not present

## 2021-09-02 DIAGNOSIS — N2581 Secondary hyperparathyroidism of renal origin: Secondary | ICD-10-CM | POA: Diagnosis not present

## 2021-09-02 DIAGNOSIS — E875 Hyperkalemia: Secondary | ICD-10-CM | POA: Diagnosis not present

## 2021-09-02 DIAGNOSIS — N1832 Chronic kidney disease, stage 3b: Secondary | ICD-10-CM | POA: Diagnosis not present

## 2021-09-02 DIAGNOSIS — E1122 Type 2 diabetes mellitus with diabetic chronic kidney disease: Secondary | ICD-10-CM | POA: Diagnosis not present

## 2021-09-02 DIAGNOSIS — D631 Anemia in chronic kidney disease: Secondary | ICD-10-CM | POA: Diagnosis not present

## 2021-09-16 ENCOUNTER — Other Ambulatory Visit: Payer: Self-pay | Admitting: Internal Medicine

## 2021-09-17 DIAGNOSIS — N1832 Chronic kidney disease, stage 3b: Secondary | ICD-10-CM | POA: Diagnosis not present

## 2021-09-19 ENCOUNTER — Other Ambulatory Visit: Payer: Self-pay | Admitting: Internal Medicine

## 2021-09-20 ENCOUNTER — Telehealth: Payer: Self-pay

## 2021-09-20 NOTE — Chronic Care Management (AMB) (Signed)
Novo Nordisk patient assistance program notification:  120- day supply of Ozempic 1 mg will be filled on 10/01/2021 and should arrive to the office in 10-14 business days.    120- day supply of Tresiba 100 u/ml will be filled on 09/27/2021 and should arrive to the office in 10-14 business days.   [name], [facility] Pharmacist Assistant [phone]

## 2021-09-23 ENCOUNTER — Other Ambulatory Visit: Payer: Self-pay

## 2021-09-23 MED ORDER — ATORVASTATIN CALCIUM 40 MG PO TABS: 40.0000 mg | ORAL_TABLET | Freq: Every day | ORAL | 2 refills | Status: DC

## 2021-09-26 ENCOUNTER — Ambulatory Visit (INDEPENDENT_AMBULATORY_CARE_PROVIDER_SITE_OTHER): Payer: Medicare Other | Admitting: Internal Medicine

## 2021-09-26 ENCOUNTER — Encounter: Payer: Self-pay | Admitting: Internal Medicine

## 2021-09-26 VITALS — BP 126/78 | HR 76 | Temp 98.1°F | Ht 62.0 in | Wt 254.0 lb

## 2021-09-26 DIAGNOSIS — Z794 Long term (current) use of insulin: Secondary | ICD-10-CM | POA: Diagnosis not present

## 2021-09-26 DIAGNOSIS — Z6841 Body Mass Index (BMI) 40.0 and over, adult: Secondary | ICD-10-CM

## 2021-09-26 DIAGNOSIS — N1832 Chronic kidney disease, stage 3b: Secondary | ICD-10-CM

## 2021-09-26 DIAGNOSIS — E1122 Type 2 diabetes mellitus with diabetic chronic kidney disease: Secondary | ICD-10-CM

## 2021-09-26 DIAGNOSIS — E782 Mixed hyperlipidemia: Secondary | ICD-10-CM

## 2021-09-26 DIAGNOSIS — I129 Hypertensive chronic kidney disease with stage 1 through stage 4 chronic kidney disease, or unspecified chronic kidney disease: Secondary | ICD-10-CM | POA: Diagnosis not present

## 2021-09-26 NOTE — Progress Notes (Unsigned)
Barnet Glasgow Martin,acting as a Education administrator for Maximino Greenland, MD.,have documented all relevant documentation on the behalf of Maximino Greenland, MD,as directed by  Maximino Greenland, MD while in the presence of Maximino Greenland, MD.    Subjective:     Patient ID: Teresa Stanley , female    DOB: [birth year] , 76 y.o.   MRN: [id number]   Chief Complaint  Patient presents with  . Diabetes  . Hypertension    HPI  She presents today for f/u DM/HTN f/u. She reports compliance with meds. She denies headaches, chest pain and shortness of breath.   BP Readings from Last 3 Encounters: 09/26/21 : 126/78 07/16/21 : 118/78 06/19/21 : 134/70    Diabetes She presents for her follow-up diabetic visit. She has type 2 diabetes mellitus. Her disease course has been stable. There are no hypoglycemic associated symptoms. Pertinent negatives for diabetes include no blurred vision and no chest pain. There are no hypoglycemic complications. Diabetic complications include nephropathy. Risk factors for coronary artery disease include diabetes mellitus, dyslipidemia, obesity, post-menopausal and sedentary lifestyle. She is compliant with treatment some of the time. Her weight is decreasing steadily. She is following a generally healthy diet. She participates in exercise intermittently. Her breakfast blood glucose is taken between 8-9 am. Her breakfast blood glucose range is generally 130-140 mg/dl. An ACE inhibitor/angiotensin II receptor blocker is being taken. Eye exam is current.  Hypertension The current episode started more than 1 year ago. The problem has been gradually improving since onset. The problem is uncontrolled. Pertinent negatives include no blurred vision, chest pain, palpitations or shortness of breath. The current treatment provides moderate improvement. Compliance problems include exercise.  Hypertensive end-organ damage includes kidney disease.     Past Medical History:  Diagnosis Date  . Arthritis    . Diabetes mellitus    takes Actos and Metformin daily and Victoza  . GERD (gastroesophageal reflux disease)    takes Omeprazole daily  . Headache(784.0)    occasionally  . Hyperlipidemia    takes Atorvastatin daily  . Hypertension    takes Lisinopril,Amlodipine,and Metoprolol daily  . Hypothyroidism   . Joint pain   . Thyroid disease    ?     Family History  Problem Relation Age of Onset  . Diabetes Mother   . Hypertension Mother   . Diabetes Father   . Asthma Father   . Hypertension Father   . Breast cancer Sister 54  . Hypertension Sister        2  . Diabetes Sister   . Hypertension Daughter   . Hypertension Brother        5  . Kidney disease Brother   . Colon cancer Neg Hx   . Esophageal cancer Neg Hx   . Rectal cancer Neg Hx   . Stomach cancer Neg Hx      Current Outpatient Medications:  .  amLODipine (NORVASC) 10 MG tablet, TAKE 1 TABLET BY MOUTH EVERY DAY IN THE EVENING, Disp: 90 tablet, Rfl: 2 .  aspirin 81 MG tablet, Take 81 mg by mouth daily., Disp: , Rfl:  .  atorvastatin (LIPITOR) 40 MG tablet, Take 1 tablet (40 mg total) by mouth daily., Disp: 90 tablet, Rfl: 2 .  BD PEN NEEDLE NANO 2ND GEN 32G X 4 MM MISC, USE WITH TRESIBA, Disp: 100 each, Rfl: 10 .  Blood Glucose Calibration (OT ULTRA/FASTTK CNTRL SOLN) SOLN, , Disp: , Rfl:  .  Blood Glucose  Monitoring Suppl (ONE TOUCH ULTRA 2) w/Device KIT, Use as directed to check blood sugars 2 times per day dx:e11.22, Disp: 1 each, Rfl: 1 .  carboxymethylcellulose (REFRESH PLUS) 0.5 % SOLN, Place 1 drop into both eyes daily., Disp: , Rfl:  .  chlorthalidone (HYGROTON) 25 MG tablet, Take 25 mg by mouth daily., Disp: , Rfl:  .  famotidine (PEPCID) 20 MG tablet, Take 20 mg by mouth daily., Disp: , Rfl:  .  gabapentin (NEURONTIN) 300 MG capsule, Take 1 capsule (300 mg total) by mouth at bedtime., Disp: 90 capsule, Rfl: 2 .  hydrALAZINE (APRESOLINE) 25 MG tablet, TAKE 1 TABLET BY MOUTH TWICE A DAY, Disp: 60 tablet,  Rfl: 1 .  insulin degludec (TRESIBA FLEXTOUCH) 100 UNIT/ML SOPN FlexTouch Pen, Inject 16 Units into the skin at bedtime. , Disp: , Rfl:  .  meclizine (ANTIVERT) 25 MG tablet, TAKE 1 TABLET (25 MG TOTAL) BY MOUTH 3 (THREE) TIMES DAILY AS NEEDED FOR DIZZINESS., Disp: 30 tablet, Rfl: 0 .  methimazole (TAPAZOLE) 5 MG tablet, TAKE 1 TABLET BY MOUTH EVERY DAY, Disp: 90 tablet, Rfl: 1 .  nitrofurantoin (MACRODANTIN) 100 MG capsule, Take 100 mg by mouth 2 (two) times daily., Disp: , Rfl:  .  olmesartan (BENICAR) 40 MG tablet, Take 1 tablet (40 mg total) by mouth daily., Disp: 90 tablet, Rfl: 2 .  OneTouch Delica Lancets 23R MISC, Use as directed to check blood sugars 2 times per day dx: e11.22, Disp: 300 each, Rfl: 2 .  ONETOUCH ULTRA test strip, USE AS INSTRUCTED TO CHECK BLOOD SUGARS 2 TIMES PER DAY DX: E11.22, Disp: 200 strip, Rfl: 4 .  propranolol ER (INDERAL LA) 120 MG 24 hr capsule, TAKE 1 CAPSULE BY MOUTH EVERY DAY, Disp: 90 capsule, Rfl: 2 .  Semaglutide, 1 MG/DOSE, 4 MG/3ML SOPN, Inject 1 mg into the skin once a week., Disp: 3 mL, Rfl: 2 .  sodium bicarbonate 650 MG tablet, TAKE 1 TABLET BY MOUTH EVERY DAY, Disp: 90 tablet, Rfl: 3 .  triamcinolone cream (KENALOG) 0.1 %, Apply 1 application topically 2 (two) times daily. Apply, Disp: 45 g, Rfl: 1 .  cholecalciferol (VITAMIN D3) 25 MCG (1000 UNIT) tablet, Take 1,000 Units by mouth daily. (Patient not taking: Reported on 09/26/2021), Disp: , Rfl:  .  LOKELMA 10 g PACK packet, Take 10 g by mouth. Every other day (Patient not taking: Reported on 09/26/2021), Disp: , Rfl:  .  olmesartan (BENICAR) 20 MG tablet, Take 20 mg by mouth daily. (Patient not taking: Reported on 09/26/2021), Disp: , Rfl:    No Known Allergies   Review of Systems  Constitutional: Negative.   Eyes:  Negative for blurred vision.  Respiratory: Negative.  Negative for shortness of breath.   Cardiovascular: Negative.  Negative for chest pain and palpitations.  Neurological: Negative.    Psychiatric/Behavioral: Negative.       Today's Vitals   09/26/21 1500  BP: 126/78  Pulse: 76  Temp: 98.1 F (36.7 C)  TempSrc: Oral  Weight: 254 lb (115.2 kg)  Height: '5\' 2"'  (1.575 m)  PainSc: 4   PainLoc: Shoulder   Body mass index is 46.46 kg/m.  Wt Readings from Last 3 Encounters:  09/26/21 254 lb (115.2 kg)  07/16/21 251 lb (113.9 kg)  06/19/21 251 lb 3.2 oz (113.9 kg)    Objective:  Physical Exam Vitals and nursing note reviewed.  Constitutional:      Appearance: Normal appearance.  HENT:     Head:  Normocephalic and atraumatic.  Eyes:     Extraocular Movements: Extraocular movements intact.  Cardiovascular:     Rate and Rhythm: Normal rate and regular rhythm.     Heart sounds: Normal heart sounds.  Pulmonary:     Effort: Pulmonary effort is normal.     Breath sounds: Normal breath sounds.  Musculoskeletal:     Cervical back: Normal range of motion.     Comments: Ambulatory with cane  Skin:    General: Skin is warm.  Neurological:     General: No focal deficit present.     Mental Status: She is alert.  Psychiatric:        Mood and Affect: Mood normal.        Behavior: Behavior normal.     Assessment And Plan:     1. Type 2 diabetes mellitus with stage 3b chronic kidney disease, with long-term current use of insulin (HCC)  2. Parenchymal renal hypertension, stage 1 through stage 4 or unspecified chronic kidney disease  3. Mixed hyperlipidemia  4. Hypertensive nephropathy  5. Class 3 severe obesity due to excess calories with serious comorbidity and body mass index (BMI) of 45.0 to 49.9 in adult [facility])     Patient was given opportunity to ask questions. Patient verbalized understanding of the plan and was able to repeat key elements of the plan. All questions were answered to their satisfaction.  Maximino Greenland, MD   I, Maximino Greenland, MD, have reviewed all documentation for this visit. The documentation on 09/26/21 for the exam, diagnosis,  procedures, and orders are all accurate and complete.   IF YOU HAVE BEEN REFERRED TO A SPECIALIST, IT MAY TAKE 1-2 WEEKS TO SCHEDULE/PROCESS THE REFERRAL. IF YOU HAVE NOT HEARD FROM US/SPECIALIST IN TWO WEEKS, PLEASE GIVE [place] A CALL AT [phone] X [phone].   THE PATIENT IS ENCOURAGED TO PRACTICE SOCIAL DISTANCING DUE TO THE COVID-19 PANDEMIC.

## 2021-09-26 NOTE — Patient Instructions (Signed)

## 2021-09-27 LAB — HEMOGLOBIN A1C
Est. average glucose Bld gHb Est-mCnc: 140 mg/dL
Hgb A1c MFr Bld: 6.5 % — ABNORMAL HIGH (ref 4.8–5.6)

## 2021-10-04 ENCOUNTER — Telehealth: Payer: Medicare Other

## 2021-10-15 ENCOUNTER — Telehealth: Payer: Self-pay

## 2021-10-15 NOTE — Chronic Care Management (AMB) (Signed)
Novo Nordisk patient assistance program notification:  120- day supply of Ozempic 1 mg and Tresiba 100 u/ml was filled on 10/03/2021 and 10/01/2021 and should arrive to the office in 10-14 business days. Patient has 0  refill remaining and enrollment will expire on 01/09/2022.  The next refill for patient will be fulfilled on 12/18/2021 and 12/20/2021.  No further actions required, patient will be due for re-enrollment.  [name], [place] Pharmacist Assistant [phone]

## 2021-10-22 DIAGNOSIS — N1832 Chronic kidney disease, stage 3b: Secondary | ICD-10-CM | POA: Diagnosis not present

## 2021-10-24 ENCOUNTER — Telehealth: Payer: Self-pay

## 2021-10-24 NOTE — Progress Notes (Signed)
10-24-2021: Notified patient that tresiba and Ozempic is ready for pick up any day but Friday.  [place] Pharmacist Assistant [phone]

## 2021-10-30 ENCOUNTER — Telehealth: Payer: Self-pay

## 2021-10-30 NOTE — Chronic Care Management (AMB) (Signed)
  Teresa Stanley was reminded to have all medications, supplements and any blood glucose and blood pressure readings available for review with [name], Pharm. D, at her telephone visit on 11-01-2021 at 10:00.   Questions: Have you had any recent office visit or specialist visit outside of [place]? Patient stated no. Patient stated [facility] nurse came to her house on 10-29-2021  Are there any concerns you would like to discuss during your office visit? Patient stated no  Are you having any problems obtaining your medications? (Whether it pharmacy issues or cost) Patient stated no  If patient has any PAP medications ask if they are having any problems getting their PAP medication or refill? Patient stated no  Care Gaps: Covid booster overdue Yearly foot exam overdue Flu vaccine overdue  Star Rating Drug: Ozempic 1 mg- Patient assistance Atorvastatin 20 mg- Last filled 09-24-2021 [facility] Olmesartan 40 mg- Last filled 09-16-2021  [facility]  Any gaps in medications fill history? No   [place] Pharmacist Assistant [phone]

## 2021-10-31 ENCOUNTER — Other Ambulatory Visit: Payer: Self-pay | Admitting: Internal Medicine

## 2021-11-01 ENCOUNTER — Ambulatory Visit (INDEPENDENT_AMBULATORY_CARE_PROVIDER_SITE_OTHER): Payer: Medicare Other

## 2021-11-01 DIAGNOSIS — N1832 Chronic kidney disease, stage 3b: Secondary | ICD-10-CM

## 2021-11-01 DIAGNOSIS — I10 Essential (primary) hypertension: Secondary | ICD-10-CM

## 2021-11-01 NOTE — Progress Notes (Unsigned)
Chronic Care Management Pharmacy Note  11/01/2021 Name:  Teresa Stanley MRN:  [id number] DOB:  Jul 22, 1945  Summary: Patient reports that she has been doing well she is very proud of her A1c.   Recommendations/Changes made from today's visit: Recommend that Teresa Stanley you receive your flu shot and COVID-19 booster.  Recommend Teresa Stanley be seen by the podiatrist   Plan: She is going to work on exercising more and joining silver sneakers.  She is going to get [facility] flu [facility] and her COVID-19 booster shot.    Subjective: Teresa Stanley is an 76 y.o. year old female who is a primary patient of [name], MD.  The CCM team was consulted for assistance with disease management and care coordination needs.    Engaged with patient by telephone for follow up visit in response to provider referral for pharmacy case management and/or care coordination services.   Consent to Services:  The patient was given information about Chronic Care Management services, agreed to services, and gave verbal consent prior to initiation of services.  Please see initial visit note for detailed documentation.   Patient Care Team: [name], MD as PCP - General (Internal Medicine)  Recent office visits: 09/26/2021 PCP OV    Recent consult visits: 07/04/2021 Podiatry OV  06/13/2021 Podiatry OV 05/23/2021 [facility] visits: None in previous 6 months   Objective:  Lab Results  Component Value Date   CREATININE 1.63 (H) 07/10/2021   BUN 33 (H) 07/10/2021   GFR 39.42 (L) 12/25/2017   EGFR 33 (L) 07/10/2021   GFRNONAA 38 (L) 03/22/2020   GFRAA 43 (L) 03/22/2020   NA 136 07/10/2021   K 4.8 07/10/2021   CALCIUM 9.1 07/10/2021   CO2 24 07/10/2021   GLUCOSE 148 (H) 07/10/2021    Lab Results  Component Value Date/Time   HGBA1C 6.5 (H) 09/26/2021 04:02 PM   HGBA1C 6.8 (H) 07/10/2021 12:00 AM   GFR 39.42 (L) 12/25/2017 03:08 PM   MICROALBUR 150 06/18/2020 12:27 PM   MICROALBUR  80 06/16/2019 02:39 PM    Last diabetic Eye exam:  Lab Results  Component Value Date/Time   HMDIABEYEEXA No Retinopathy 04/02/2021 12:00 AM    Last diabetic Foot exam: No results found for: "HMDIABFOOTEX"   Lab Results  Component Value Date   CHOL 165 07/10/2021   HDL 62 07/10/2021   LDLCALC 86 07/10/2021   TRIG 94 07/10/2021   CHOLHDL 2.7 07/10/2021       Latest Ref Rng & Units 07/10/2021   12:00 AM 04/03/2021   10:51 AM 03/25/2021   12:00 AM  Hepatic Function  Total Protein 6.0 - 8.5 g/dL 6.7  7.1    Albumin 3.7 - 4.7 g/dL 3.8  4.2  4.3      AST 0 - 40 IU/L 16  17    ALT 0 - 32 IU/L 11  12    Alk Phosphatase 44 - 121 IU/L 116  138    Total Bilirubin 0.0 - 1.2 mg/dL 0.3  0.5       This result is from an external source.    Lab Results  Component Value Date/Time   TSH 2.930 03/22/2020 11:12 AM   TSH 0.005 (L) 08/23/2009 07:54 PM   FREET4 1.73 03/22/2020 11:12 AM   FREET4 1.33 08/23/2009 07:54 PM       Latest Ref Rng & Units 07/10/2021   12:00 AM 06/18/2020   12:10 PM  03/22/2020   11:12 AM  CBC  WBC 3.4 - 10.8 x10E3/uL 7.2  8.4  7.5   Hemoglobin 11.1 - 15.9 g/dL 11.6  12.9  12.9   Hematocrit 34.0 - 46.6 % 35.6  40.5  41.2   Platelets 150 - 450 x10E3/uL 270  388  305     No results found for: "VD25OH"  Clinical ASCVD: No  The 10-year ASCVD risk score (Arnett DK, et al., 2019) is: 28.9%   Values used to calculate the score:     Age: 54 years     Sex: Female     Is Non-Hispanic African American: Yes     Diabetic: Yes     Tobacco smoker: No     Systolic Blood Pressure: 622 mmHg     Is BP treated: Yes     HDL Cholesterol: 62 mg/dL     Total Cholesterol: 165 mg/dL       09/26/2021    2:59 PM 03/21/2021   11:42 AM 03/22/2020    9:50 AM  Depression screen PHQ 2/9  Decreased Interest 0 0 0  Down, Depressed, Hopeless 0 0 0  PHQ - 2 Score 0 0 0     Social History   Tobacco Use  Smoking Status Never   Passive exposure: Never  Smokeless Tobacco Never   BP  Readings from Last 3 Encounters:  09/26/21 126/78  07/16/21 118/78  06/19/21 134/70   Pulse Readings from Last 3 Encounters:  09/26/21 76  06/19/21 84  04/03/21 69   Wt Readings from Last 3 Encounters:  09/26/21 254 lb (115.2 kg)  07/16/21 251 lb (113.9 kg)  06/19/21 251 lb 3.2 oz (113.9 kg)   BMI Readings from Last 3 Encounters:  09/26/21 46.46 kg/m  07/16/21 45.91 kg/m  06/19/21 45.95 kg/m    Assessment/Interventions: Review of patient past medical history, allergies, medications, health status, including review of consultants reports, laboratory and other test data, was performed as part of comprehensive evaluation and provision of chronic care management services.   SDOH:  (Social Determinants of Health) assessments and interventions performed: No SDOH Interventions    Flowsheet Row Chronic Care Management from 10/12/2019 in [facility] Chronic Care Management from 09/08/2019 in [facility] Chronic Care Management from 08/04/2019 in [facility] Clinical Support from 03/17/2019 in [place] from 05/27/2018 in [facility]  SDOH Interventions       Depression Interventions/Treatment  -- -- -- WLN9-8 Score <4 Follow-up Not Indicated PHQ2-9 Score <4 Follow-up Not Indicated  Financial Strain Interventions Other (Comment)  [Will continue to assist with obtaining Ozempic and Tresiba through [employer] patient assistance program] Other (Comment)  [Placed refill requests for [place] and [employer] with [employer] patient assistance program] Other (Comment)  [Will continue to assist with obtaining medications via patient assistance program] -- --      SDOH Screenings   Food Insecurity: No Food Insecurity (03/21/2021)  Housing: Low Risk  (09/14/2018)  Transportation Needs: No Transportation Needs (03/21/2021)  Depression (PHQ2-9): Low Risk  (09/26/2021)  Financial  Resource Strain: Low Risk  (03/21/2021)  Physical Activity: Inactive (03/21/2021)  Stress: No Stress Concern Present (03/21/2021)  Tobacco Use: Low Risk  (09/26/2021)    [place]  No Known Allergies  Medications Reviewed Today     Reviewed by Mayford Knife, [place] (Pharmacist) on 11/01/21 at [place]  Med List Status: <None>   Medication Order Taking?  Sig Documenting Provider Last Dose Status Informant  amLODipine (NORVASC) 10 MG tablet [id number] Yes TAKE 1 TABLET BY MOUTH EVERY DAY IN THE EVENING Ghumman, Ramandeep, NP Taking Active   aspirin 81 MG tablet [id number] Yes Take 81 mg by mouth daily. [provider] Taking Active   atorvastatin (LIPITOR) 40 MG tablet [id number] Yes Take 1 tablet (40 mg total) by mouth daily. [name], MD Taking Active   BD PEN NEEDLE NANO 2ND GEN 32G X 4 MM MISC [id number] Yes USE WITH Thornton Papas, MD Taking Active   Blood Glucose Calibration (OT ULTRA/FASTTK CNTRL Moishe Spice [id number] Yes  [provider] Taking Active   Blood Glucose Monitoring Suppl (ONE TOUCH ULTRA 2) w/Device KIT [id number] Yes Use as directed to check blood sugars 2 times per day dx:e11.22 [name], MD Taking Active   carboxymethylcellulose (REFRESH PLUS) 0.5 % SOLN [id number] Yes Place 1 drop into both eyes daily. [provider] Taking Active Self  chlorthalidone (HYGROTON) 25 MG tablet [id number] Yes Take 25 mg by mouth daily. [provider] Taking Active   cholecalciferol (VITAMIN D3) 25 MCG (1000 UNIT) tablet [id number] Yes Take 1,000 Units by mouth daily. [provider] Taking Active   famotidine (PEPCID) 20 MG tablet [id number] Yes Take 20 mg by mouth daily. [provider] Taking Active   gabapentin (NEURONTIN) 300 MG capsule [id number] Yes Take 1 capsule (300 mg total) by mouth at bedtime. [name], MD Taking Active   hydrALAZINE (APRESOLINE) 25 MG tablet [id number] Yes TAKE 1 TABLET BY MOUTH TWICE A Lynnell Dike, MD Taking Active   insulin degludec (TRESIBA FLEXTOUCH) 100 UNIT/ML SOPN FlexTouch Pen [id number] Yes Inject 16 Units into the skin at bedtime.  [provider] Taking Active   LOKELMA 10 g PACK packet [id number] No Take 10 g by mouth. Every other day  Patient not taking: Reported on 11/01/2021   [provider] Not Taking Active            Med Note Mayford Knife   Fri Nov 01, 2021  9:02 AM) Not currently taking this medication   meclizine (ANTIVERT) 25 MG tablet [id number] Yes TAKE 1 TABLET (25 MG TOTAL) BY MOUTH 3 (THREE) TIMES DAILY AS NEEDED FOR DIZZINESS. [name], MD Taking Active   methimazole (TAPAZOLE) 5 MG tablet [id number] Yes TAKE 1 TABLET BY MOUTH EVERY DAY Ghumman, Ramandeep, NP Taking Active   nitrofurantoin (MACRODANTIN) 100 MG capsule [id number] No Take 100 mg by mouth 2 (two) times daily.  Patient not taking: Reported on 11/01/2021   [provider] Not Taking Active   olmesartan (BENICAR) 40 MG tablet [id number]  Take 1 tablet (40 mg total) by mouth daily. [name], MD  Expired 10/09/21 3419   OneTouch Delica Lancets 37T MISC [id number] Yes Use as directed to check blood sugars 2 times per day dx: e11.22 [name], MD Taking Active   [facility] ULTRA test strip [id number] Yes USE AS INSTRUCTED TO CHECK BLOOD SUGARS 2 TIMES PER DAY DX: E11.22 [name], MD Taking Active   propranolol ER (INDERAL LA) 120 MG 24 hr capsule [id number] Yes TAKE 1 CAPSULE BY MOUTH EVERY DAY [name], MD Taking Active   Semaglutide, 1 MG/DOSE, 4 MG/3ML SOPN [id number] Yes Inject 1 mg into the skin once a week. [name], MD Taking Active   sodium bicarbonate 650 MG tablet [id number] Yes TAKE 1 TABLET BY MOUTH EVERY DAY [name], MD Taking Active   triamcinolone  cream (KENALOG) 0.1 % [id number] Yes Apply 1 application topically 2 (two) times daily. 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   S/P total knee arthroplasty 04/18/2013   Unilateral primary osteoarthritis, right knee 04/04/2013    Class: Diagnosis of   Candidal intertrigo 08/09/2012   Hypertension 12/15/2011   Type 2 diabetes mellitus with stage 3b chronic kidney disease, with long-term current use of insulin ([place]) 12/15/2011    Immunization History  Administered Date(s) Administered   Fluad Quad(high Dose 65+) 12/26/2020   Influenza, High Dose Seasonal PF 11/12/2017, 11/09/2018, 11/22/2019   PFIZER Comirnaty(Gray Top)Covid-19 Tri-Sucrose Vaccine 08/01/2020   PFIZER(Purple Top)SARS-COV-2 Vaccination 04/04/2019, 04/25/2019, 12/07/2019   PNEUMOCOCCAL CONJUGATE-20 06/19/2021   Pneumococcal Polysaccharide-23 11/29/2015   Zoster Recombinat (Shingrix) 04/03/2021, 07/16/2021    Conditions to be addressed/monitored:  Hypertension and Diabetes  Care Plan : Pharmacy Care Plan  Updates made by Mayford Knife, [place] since 11/05/2021 12:00 AM     Problem: HTN, DM II   Priority: High    Current Barriers:  Unable to independently afford treatment regimen Unable to independently monitor therapeutic efficacy  Pharmacist Clinical Goal(s):  Patient will achieve adherence to monitoring guidelines and medication adherence to achieve therapeutic efficacy through collaboration with PharmD and provider.   Interventions: 1:1 collaboration with [name], MD regarding development and update of comprehensive plan of care as evidenced by provider attestation and co-signature Inter-disciplinary care team collaboration (see longitudinal plan of care) Comprehensive medication review performed; medication list updated in electronic medical record  Hypertension (BP goal  <130/80) -Controlled -Current treatment: Amlodipine 10 mg tablet once per day Appropriate, Effective, Safe, Accessible Olmesartan 40 mg tablet once per day Appropriate, Effective, Safe, Accessible -Current home readings: 120/60 -Current dietary habits: she is making sure that she avoids salt  -Current exercise habits: please see below  -Denies hypotensive/hypertensive symptoms -Educated on Daily salt intake goal < 2300 mg; Importance of home blood pressure monitoring; Proper BP monitoring technique; -Counseled to monitor BP at home at least twice per week, document, and provide log at future appointments -Recommended to continue current medication  Diabetes (A1c goal <7%) -Controlled -Current medications: [name] Flextouch 100 unit/ml - inject 16 units once daily.  Appropriate, Effective, Safe, Accessible Ozempic 1 mg/dose - take once per week Appropriate, Effective, Safe, Accessible -Current home glucose readings fasting glucose: [phone] -Denies hypoglycemic/hyperglycemic symptoms -Current meal patterns:  breakfast: eggs with [place] sausage   lunch: sometimes a [place] sandwich   dinner: meat and vegetables  snacks: yogurt, fruit drinks: she is drinking at least 6 bottles of water per day  -Current exercise: she is doing some exercise at home, about 60 minutes per week  -Educated on A1c and blood sugar goals; Exercise goal of 150 minutes per week; -Counseled to check feet daily and get yearly eye exams -Recommended to continue current medication  Patient Goals/Self-Care Activities Patient will:  - take medications as prescribed as evidenced by patient report and record review  Follow Up Plan: The patient has been provided with contact information for the care management team and has been advised to call with any health related questions or concerns.    Medication  Assistance:  Joni Reining obtained through [employer] medication assistance program.  Enrollment ends  12/2021  Compliance/Adherence/Medication fill history: Care Gaps: COVID -19 Vaccine FOOT Exam  Influenza Vaccine  Star-Rating Drugs: Atorvastatin 40 mg  Ozempic 1 mg  Olmesartan 40 mg   [id number]: [phone]: [phone] Uses pill box? Yes Pt endorses 90% compliance  We discussed: Benefits of medication synchronization, packaging and delivery as well as enhanced pharmacist oversight with [employer]. Patient decided to: Continue current medication management strategy  Care Plan and Follow Up Patient Decision:  Patient agrees to Care Plan and Follow-up.  Plan: The patient has been provided with contact information for the care management team and has been advised to call with any health related questions or concerns.   VOrlando Penner CPP, PharmD Clinical Pharmacist Practitioner [facility] [phone]

## 2021-11-01 NOTE — Patient Instructions (Signed)
Visit Information It was great speaking with you today!  Please let me know if you have any questions about our visit.   Goals Addressed             This Visit's Progress    Manage My Medicine       Timeframe:  Long-Range Goal Priority:  High Start Date:                             Expected End Date:                       Follow Up Date 04/30/2022  In Progress: - call for medicine refill 2 or 3 days before it runs out - call if I am sick and can't take my medicine - use a pillbox to sort medicine - use an alarm clock or phone to remind me to take my medicine    Why is this important?   These steps will help you keep on track with your medicines.   Notes:  Try to take your medication at the same time each day Keep up the good work        Patient Care Plan: Pharmacy Care Plan     Problem Identified: HTN, DM II, HLD   Priority: High     Long-Range Goal: Disease Management   Recent Progress: On track  Note:   Current Barriers:  Unable to independently afford treatment regimen Unable to independently monitor therapeutic efficacy  Pharmacist Clinical Goal(s):  Patient will achieve adherence to monitoring guidelines and medication adherence to achieve therapeutic efficacy through collaboration with PharmD and provider.   Interventions: 1:1 collaboration with [name], MD regarding development and update of comprehensive plan of care as evidenced by provider attestation and co-signature Inter-disciplinary care team collaboration (see longitudinal plan of care) Comprehensive medication review performed; medication list updated in electronic medical record  Hypertension (BP goal <130/80) -Controlled -Current treatment: Amlodipine 10 mg tablet once per day Appropriate, Effective, Safe, Accessible Olmesartan 40 mg tablet once per day Appropriate, Effective, Safe, Accessible -Current home readings: 120/60 -Current dietary habits: she is making sure that she  avoids salt  -Current exercise habits: please see below  -Denies hypotensive/hypertensive symptoms -Educated on Daily salt intake goal < 2300 mg; Importance of home blood pressure monitoring; Proper BP monitoring technique; -Counseled to monitor BP at home at least twice per week, document, and provide log at future appointments -Recommended to continue current medication  Diabetes (A1c goal <7%) -Controlled -Current medications: [name] Flextouch 100 unit/ml - inject 16 units once daily.  Appropriate, Effective, Safe, Accessible Ozempic 1 mg/dose - take once per week Appropriate, Effective, Safe, Accessible -Current home glucose readings fasting glucose: [phone] -Denies hypoglycemic/hyperglycemic symptoms -Current meal patterns:  breakfast: eggs with [place] sausage   lunch: sometimes a [place] sandwich   dinner: meat and vegetables  snacks: yogurt, fruit drinks: she is drinking at least 6 bottles of water per day  -Current exercise: she is doing some exercise at home, about 60 minutes per week  -Educated on A1c and blood sugar goals; Exercise goal of 150 minutes per week; -Counseled to check feet daily and get yearly eye exams -Recommended to continue current medication  Patient Goals/Self-Care Activities Patient will:  - take medications as prescribed as evidenced by patient report and record review  Follow Up Plan: The patient has been provided with contact information for the care management  team and has been advised to call with any health related questions or concerns.       Patient agreed to services and verbal consent obtained.   The patient verbalized understanding of instructions, educational materials, and care plan provided today and agreed to receive a mailed copy of patient instructions, educational materials, and care plan.   [name], PharmD Clinical Pharmacist [facility] [phone]

## 2021-11-09 DIAGNOSIS — I10 Essential (primary) hypertension: Secondary | ICD-10-CM | POA: Diagnosis not present

## 2021-11-09 DIAGNOSIS — E1159 Type 2 diabetes mellitus with other circulatory complications: Secondary | ICD-10-CM

## 2021-11-09 DIAGNOSIS — Z794 Long term (current) use of insulin: Secondary | ICD-10-CM

## 2021-11-19 ENCOUNTER — Other Ambulatory Visit: Payer: Self-pay

## 2021-11-19 MED ORDER — METHIMAZOLE 5 MG PO TABS: 5.0000 mg | ORAL_TABLET | Freq: Every day | ORAL | 1 refills | Status: DC

## 2021-12-19 ENCOUNTER — Other Ambulatory Visit: Payer: Self-pay

## 2021-12-19 MED ORDER — AMLODIPINE BESYLATE 10 MG PO TABS: 10.0000 mg | ORAL_TABLET | Freq: Every day | ORAL | 2 refills | Status: DC

## 2021-12-31 ENCOUNTER — Ambulatory Visit (INDEPENDENT_AMBULATORY_CARE_PROVIDER_SITE_OTHER): Payer: Medicare Other | Admitting: Internal Medicine

## 2021-12-31 ENCOUNTER — Encounter: Payer: Self-pay | Admitting: Internal Medicine

## 2021-12-31 VITALS — BP 120/84 | Temp 98.2°F | Ht 61.6 in | Wt 240.0 lb

## 2021-12-31 DIAGNOSIS — I129 Hypertensive chronic kidney disease with stage 1 through stage 4 chronic kidney disease, or unspecified chronic kidney disease: Secondary | ICD-10-CM | POA: Diagnosis not present

## 2021-12-31 DIAGNOSIS — Z6841 Body Mass Index (BMI) 40.0 and over, adult: Secondary | ICD-10-CM

## 2021-12-31 DIAGNOSIS — E1122 Type 2 diabetes mellitus with diabetic chronic kidney disease: Secondary | ICD-10-CM

## 2021-12-31 DIAGNOSIS — N1832 Chronic kidney disease, stage 3b: Secondary | ICD-10-CM | POA: Diagnosis not present

## 2021-12-31 DIAGNOSIS — M79651 Pain in right thigh: Secondary | ICD-10-CM | POA: Diagnosis not present

## 2021-12-31 DIAGNOSIS — Z794 Long term (current) use of insulin: Secondary | ICD-10-CM | POA: Diagnosis not present

## 2021-12-31 NOTE — Patient Instructions (Signed)

## 2021-12-31 NOTE — Progress Notes (Signed)
Rich Brave [place]ittleton,acting as a Education administrator for Maximino Greenland, MD.,have documented all relevant documentation on the behalf of Maximino Greenland, MD,as directed by  Maximino Greenland, MD while in the presence of Maximino Greenland, MD.    Subjective:     Patient ID: Teresa Stanley , female    DOB: 04-09-1945 , 76 y.o.   MRN: [id number]   Chief Complaint  Patient presents with   Diabetes   Hypertension    HPI  She presents today for f/u DM/HTN f/u. She reports compliance with meds. She is accompanied by her daughter. She denies having any headaches, chest pain and shortness of breath.    Her daughter adds that the patient has c/o right thigh pain. Pain states sx have since resolved. Pain described a sharp, shooting pain down the back of her right thigh. Denies recent fall/trauma.  Usually occurs when she first gets up in the mornings.  She states the last time she experienced the pain was about 3 weeks ago.    BP Readings from Last 3 Encounters: 12/31/21 : 120/84 09/26/21 : 126/78 07/16/21 : 118/78    Diabetes She presents for her follow-up diabetic visit. She has type 2 diabetes mellitus. Her disease course has been stable. There are no hypoglycemic associated symptoms. Pertinent negatives for diabetes include no blurred vision and no chest pain. There are no hypoglycemic complications. Diabetic complications include nephropathy. Risk factors for coronary artery disease include diabetes mellitus, dyslipidemia, obesity, post-menopausal and sedentary lifestyle. She is compliant with treatment some of the time. Her weight is decreasing steadily. She is following a generally healthy diet. She participates in exercise intermittently. Her breakfast blood glucose is taken between 8-9 am. Her breakfast blood glucose range is generally 130-140 mg/dl. An ACE inhibitor/angiotensin II receptor blocker is being taken. Eye exam is current.  Hypertension The current episode started more than 1 year ago. The  problem has been gradually improving since onset. The problem is uncontrolled. Pertinent negatives include no blurred vision, chest pain, palpitations or shortness of breath. The current treatment provides moderate improvement. Compliance problems include exercise.  Hypertensive end-organ damage includes kidney disease.     Past Medical History:  Diagnosis Date   Arthritis    Diabetes mellitus    takes Actos and Metformin daily and Victoza   GERD (gastroesophageal reflux disease)    takes Omeprazole daily   Headache(784.0)    occasionally   Hyperlipidemia    takes Atorvastatin daily   Hypertension    takes Lisinopril,Amlodipine,and Metoprolol daily   Hypothyroidism    Joint pain    Thyroid disease    ?     Family History  Problem Relation Age of Onset   Diabetes Mother    Hypertension Mother    Diabetes Father    Asthma Father    Hypertension Father    Breast cancer Sister 83   Hypertension Sister        2   Diabetes Sister    Hypertension Daughter    Hypertension Brother        5   Kidney disease Brother    Colon cancer Neg Hx    Esophageal cancer Neg Hx    Rectal cancer Neg Hx    Stomach cancer Neg Hx      Current Outpatient Medications:    amLODipine (NORVASC) 10 MG tablet, Take 1 tablet (10 mg total) by mouth daily., Disp: 90 tablet, Rfl: 2   aspirin 81 MG tablet, Take  81 mg by mouth daily., Disp: , Rfl:    atorvastatin (LIPITOR) 40 MG tablet, Take 1 tablet (40 mg total) by mouth daily., Disp: 90 tablet, Rfl: 2   BD PEN NEEDLE NANO 2ND GEN 32G X 4 MM MISC, USE WITH TRESIBA, Disp: 100 each, Rfl: 10   Blood Glucose Calibration (OT ULTRA/FASTTK CNTRL SOLN) SOLN, , Disp: , Rfl:    Blood Glucose Monitoring Suppl (ONE TOUCH ULTRA 2) w/Device KIT, Use as directed to check blood sugars 2 times per day dx:e11.22, Disp: 1 each, Rfl: 1   carboxymethylcellulose (REFRESH PLUS) 0.5 % SOLN, Place 1 drop into both eyes daily., Disp: , Rfl:    chlorthalidone (HYGROTON) 25 MG  tablet, Take 25 mg by mouth daily., Disp: , Rfl:    cholecalciferol (VITAMIN D3) 25 MCG (1000 UNIT) tablet, Take 1,000 Units by mouth daily., Disp: , Rfl:    famotidine (PEPCID) 20 MG tablet, Take 20 mg by mouth daily., Disp: , Rfl:    gabapentin (NEURONTIN) 300 MG capsule, Take 1 capsule (300 mg total) by mouth at bedtime., Disp: 90 capsule, Rfl: 2   hydrALAZINE (APRESOLINE) 25 MG tablet, TAKE 1 TABLET BY MOUTH TWICE A DAY, Disp: 60 tablet, Rfl: 1   insulin degludec (TRESIBA FLEXTOUCH) 100 UNIT/ML SOPN FlexTouch Pen, Inject 16 Units into the skin at bedtime. , Disp: , Rfl:    meclizine (ANTIVERT) 25 MG tablet, TAKE 1 TABLET (25 MG TOTAL) BY MOUTH 3 (THREE) TIMES DAILY AS NEEDED FOR DIZZINESS., Disp: 30 tablet, Rfl: 0   methimazole (TAPAZOLE) 5 MG tablet, Take 1 tablet (5 mg total) by mouth daily., Disp: 90 tablet, Rfl: 1   olmesartan (BENICAR) 40 MG tablet, Take 1 tablet (40 mg total) by mouth daily., Disp: 90 tablet, Rfl: 2   OneTouch Delica Lancets 40J MISC, Use as directed to check blood sugars 2 times per day dx: e11.22, Disp: 300 each, Rfl: 2   ONETOUCH ULTRA test strip, USE AS INSTRUCTED TO CHECK BLOOD SUGARS 2 TIMES PER DAY DX: E11.22, Disp: 200 strip, Rfl: 4   propranolol ER (INDERAL LA) 120 MG 24 hr capsule, TAKE 1 CAPSULE BY MOUTH EVERY DAY, Disp: 90 capsule, Rfl: 2   Semaglutide, 1 MG/DOSE, 4 MG/3ML SOPN, Inject 1 mg into the skin once a week., Disp: 3 mL, Rfl: 2   sodium bicarbonate 650 MG tablet, TAKE 1 TABLET BY MOUTH EVERY DAY, Disp: 90 tablet, Rfl: 3   triamcinolone cream (KENALOG) 0.1 %, Apply 1 application topically 2 (two) times daily. Apply, Disp: 45 g, Rfl: 1   LOKELMA 10 g PACK packet, Take 10 g by mouth. Every other day (Patient not taking: Reported on 11/01/2021), Disp: , Rfl:    No Known Allergies   Review of Systems  Constitutional: Negative.   Eyes: Negative.  Negative for blurred vision.  Respiratory: Negative.  Negative for shortness of breath.   Cardiovascular:  Negative.  Negative for chest pain and palpitations.  Gastrointestinal: Negative.   Musculoskeletal: Negative.   Skin: Negative.   Neurological: Negative.   Psychiatric/Behavioral: Negative.       Today's Vitals   12/31/21 1504  BP: 120/84  Temp: 98.2 F (36.8 C)  Weight: 240 lb (108.9 kg)  Height: 5' 1.6" (1.565 m)  PainSc: 0-No pain   Body mass index is 44.47 kg/m.  Wt Readings from Last 3 Encounters:  12/31/21 240 lb (108.9 kg)  09/26/21 254 lb (115.2 kg)  07/16/21 251 lb (113.9 kg)    Objective:  Physical Exam Vitals and nursing note reviewed.  Constitutional:      Appearance: Normal appearance.  HENT:     Head: Normocephalic and atraumatic.     Nose:     Comments: Masked     Mouth/Throat:     Comments: Masked  Eyes:     Extraocular Movements: Extraocular movements intact.  Cardiovascular:     Rate and Rhythm: Normal rate and regular rhythm.     Pulses:          Dorsalis pedis pulses are 2+ on the right side and 2+ on the left side.     Heart sounds: Normal heart sounds.  Pulmonary:     Effort: Pulmonary effort is normal.     Breath sounds: Normal breath sounds.  Feet:     Right foot:     Protective Sensation: 5 sites tested.  5 sites sensed.     Skin integrity: Dry skin present.     Toenail Condition: Right toenails are normal.     Left foot:     Protective Sensation: 5 sites tested.  5 sites sensed.     Skin integrity: Dry skin present.     Toenail Condition: Left toenails are normal.  Skin:    General: Skin is warm.  Neurological:     General: No focal deficit present.     Mental Status: She is alert.  Psychiatric:        Mood and Affect: Mood normal.        Behavior: Behavior normal.     Assessment And Plan:     1. Type 2 diabetes mellitus with stage 3b chronic kidney disease, with long-term current use of insulin (HCC) Comments: Chronic, diabetic foot exam performed. I will check labs as below. She was congratulated on her lifestyle changes  .She agrees to f/u in 3 months. - Urine microalbumin-creatinine with uACR - BMP8+EGFR - Hemoglobin A1c - Amb Referral To Provider Referral Exercise Program (P.R.E.P) - TSH  2. Parenchymal renal hypertension, stage 1 through stage 4 or unspecified chronic kidney disease Comments: Chronic, well controlled. NO med changes. Encouraged to follow low sodium diet. - Amb Referral To Provider Referral Exercise Program (P.R.E.P) - TSH  3. Pain of right thigh Comments: Sx are suggestive of sciatica.  She was given several stretching exercises to perform daily.  4. Class 3 severe obesity due to excess calories with serious comorbidity and body mass index (BMI) of 40.0 to 44.9 in adult [facility]) - Amb Referral To Provider Referral Exercise Program (P.R.E.P)   Patient was given opportunity to ask questions. Patient verbalized understanding of the plan and was able to repeat key elements of the plan. All questions were answered to their satisfaction.   I, Maximino Greenland, MD, have reviewed all documentation for this visit. The documentation on 12/31/21 for the exam, diagnosis, procedures, and orders are all accurate and complete.   IF YOU HAVE BEEN REFERRED TO A SPECIALIST, IT MAY TAKE 1-2 WEEKS TO SCHEDULE/PROCESS THE REFERRAL. IF YOU HAVE NOT HEARD FROM US/SPECIALIST IN TWO WEEKS, PLEASE GIVE [place] A CALL AT [phone].   THE PATIENT IS ENCOURAGED TO PRACTICE SOCIAL DISTANCING DUE TO THE COVID-19 PANDEMIC.

## 2022-01-01 LAB — MICROALBUMIN / CREATININE URINE RATIO
Creatinine, Urine: 68 mg/dL
Microalb/Creat Ratio: 183 mg/g creat — ABNORMAL HIGH (ref 0–29)
Microalbumin, Urine: 124.7 ug/mL

## 2022-01-01 LAB — BMP8+EGFR
BUN/Creatinine Ratio: 21 (ref 12–28)
BUN: 38 mg/dL — ABNORMAL HIGH (ref 8–27)
CO2: 26 mmol/L (ref 20–29)
Calcium: 9.8 mg/dL (ref 8.7–10.3)
Chloride: 100 mmol/L (ref 96–106)
Creatinine, Ser: 1.8 mg/dL — ABNORMAL HIGH (ref 0.57–1.00)
Glucose: 77 mg/dL (ref 70–99)
Potassium: 5.3 mmol/L — ABNORMAL HIGH (ref 3.5–5.2)
Sodium: 140 mmol/L (ref 134–144)
eGFR: 29 mL/min/{1.73_m2} — ABNORMAL LOW (ref >59)

## 2022-01-01 LAB — HEMOGLOBIN A1C
Est. average glucose Bld gHb Est-mCnc: 126 mg/dL
Hgb A1c MFr Bld: 6 % — ABNORMAL HIGH (ref 4.8–5.6)

## 2022-01-01 LAB — TSH: TSH: 2.55 u[IU]/mL (ref 0.450–4.500)

## 2022-01-04 ENCOUNTER — Encounter: Payer: Self-pay | Admitting: Internal Medicine

## 2022-01-06 ENCOUNTER — Telehealth: Payer: Self-pay | Admitting: *Deleted

## 2022-01-06 NOTE — Telephone Encounter (Signed)
Contacted regarding PREP Class referral. Is interested in participating at the [facility] and is willing to wait for class availability in January/February 2024.

## 2022-01-08 ENCOUNTER — Telehealth: Payer: Self-pay

## 2022-01-08 NOTE — Progress Notes (Signed)
01-08-2022: Left patient a VM that her signature is needed on ozempic application and application will be at the front desk at [facility].  [place] Pharmacist Assistant [phone]

## 2022-01-14 ENCOUNTER — Other Ambulatory Visit: Payer: Self-pay | Admitting: Internal Medicine

## 2022-01-14 ENCOUNTER — Telehealth: Payer: Self-pay

## 2022-01-14 NOTE — Chronic Care Management (AMB) (Signed)
Faxed 2024 re-enrollment application to [employer] patient assistance for  [place] and Novofine needles.   [name], [place] Pharmacist Assistant [phone]

## 2022-02-17 DIAGNOSIS — N1832 Chronic kidney disease, stage 3b: Secondary | ICD-10-CM | POA: Diagnosis not present

## 2022-02-27 DIAGNOSIS — D631 Anemia in chronic kidney disease: Secondary | ICD-10-CM | POA: Diagnosis not present

## 2022-02-27 DIAGNOSIS — E1122 Type 2 diabetes mellitus with diabetic chronic kidney disease: Secondary | ICD-10-CM | POA: Diagnosis not present

## 2022-02-27 DIAGNOSIS — N2581 Secondary hyperparathyroidism of renal origin: Secondary | ICD-10-CM | POA: Diagnosis not present

## 2022-02-27 DIAGNOSIS — E875 Hyperkalemia: Secondary | ICD-10-CM | POA: Diagnosis not present

## 2022-02-27 DIAGNOSIS — R809 Proteinuria, unspecified: Secondary | ICD-10-CM | POA: Diagnosis not present

## 2022-02-27 DIAGNOSIS — I129 Hypertensive chronic kidney disease with stage 1 through stage 4 chronic kidney disease, or unspecified chronic kidney disease: Secondary | ICD-10-CM | POA: Diagnosis not present

## 2022-02-27 DIAGNOSIS — N1832 Chronic kidney disease, stage 3b: Secondary | ICD-10-CM | POA: Diagnosis not present

## 2022-03-12 ENCOUNTER — Ambulatory Visit: Payer: Medicare Other

## 2022-03-12 ENCOUNTER — Telehealth: Payer: Self-pay

## 2022-03-12 MED ORDER — FAMOTIDINE 10 MG PO TABS: 10.0000 mg | ORAL_TABLET | Freq: Every day | ORAL | 1 refills | Status: AC

## 2022-03-12 NOTE — Progress Notes (Signed)
Care Management & Coordination Services Pharmacy Note  03/12/2022 Name:  Teresa Stanley MRN:  [id number] DOB:  06-May-1945  Summary: [name] is going to bring her financial documentation for [employer] patient assistance by today or tomorrow. Collaborate with PCP to renally adjust Famotidine dose.   Recommendations/Changes made from today's visit: Recommend changing Famotidine 10 mg tablet once per day due to renal function  Patient to bring by new financial document for 2024.  Discontinue Lokelma (written by specialist) due to potassium level, as completed by during January 18th, 2024 renal visit Increase water intake    Follow up plan: Collaborate with PCP to change patients dose of Famotidine 10 mg tablet daily  Patient or son to bring in new financial documentation for 2024  Increase water consumption to 4 bottles per day    Subjective: Teresa Stanley is an 77 y.o. year old female who is a primary patient of [name], MD.  The care coordination team was consulted for assistance with disease management and care coordination needs.    Engaged with patient by telephone for follow up visit.  Recent office visits: 12/31/2021 PCP OV  Recent consult visits: 02/27/2022 - Endocrinology appointment   Hospital visits: None in previous 6 months   Objective:  Lab Results  Component Value Date   CREATININE 1.80 (H) 12/31/2021   BUN 38 (H) 12/31/2021   GFR 39.42 (L) 12/25/2017   EGFR 29 (L) 12/31/2021   GFRNONAA 38 (L) 03/22/2020   GFRAA 43 (L) 03/22/2020   NA 140 12/31/2021   K 5.3 (H) 12/31/2021   CALCIUM 9.8 12/31/2021   CO2 26 12/31/2021   GLUCOSE 77 12/31/2021    Lab Results  Component Value Date/Time   HGBA1C 6.0 (H) 12/31/2021 03:51 PM   HGBA1C 6.5 (H) 09/26/2021 04:02 PM   GFR 39.42 (L) 12/25/2017 03:08 PM   MICROALBUR 150 06/18/2020 12:27 PM   MICROALBUR 80 06/16/2019 02:39 PM    Last diabetic Eye exam:  Lab Results  Component Value Date/Time    HMDIABEYEEXA No Retinopathy 04/02/2021 12:00 AM    Last diabetic Foot exam: No results found for: "HMDIABFOOTEX"   Lab Results  Component Value Date   CHOL 165 07/10/2021   HDL 62 07/10/2021   LDLCALC 86 07/10/2021   TRIG 94 07/10/2021   CHOLHDL 2.7 07/10/2021       Latest Ref Rng & Units 07/10/2021   12:00 AM 04/03/2021   10:51 AM 03/25/2021   12:00 AM  Hepatic Function  Total Protein 6.0 - 8.5 g/dL 6.7  7.1    Albumin 3.7 - 4.7 g/dL 3.8  4.2  4.3      AST 0 - 40 IU/L 16  17    ALT 0 - 32 IU/L 11  12    Alk Phosphatase 44 - 121 IU/L 116  138    Total Bilirubin 0.0 - 1.2 mg/dL 0.3  0.5       This result is from an external source.    Lab Results  Component Value Date/Time   TSH 2.550 12/31/2021 03:51 PM   TSH 2.930 03/22/2020 11:12 AM   FREET4 1.73 03/22/2020 11:12 AM   FREET4 1.33 08/23/2009 07:54 PM       Latest Ref Rng & Units 07/10/2021   12:00 AM 06/18/2020   12:10 PM 03/22/2020   11:12 AM  CBC  WBC 3.4 - 10.8 x10E3/uL 7.2  8.4  7.5   Hemoglobin 11.1 - 15.9 g/dL 11.6  12.9  12.9   Hematocrit 34.0 - 46.6 % 35.6  40.5  41.2   Platelets 150 - 450 x10E3/uL 270  388  305     Lab Results  Component Value Date/Time   VITAMINB12 1,167 04/03/2021 10:51 AM    Clinical ASCVD: Yes  The 10-year ASCVD risk score (Arnett DK, et al., 2019) is: 27.1%   Values used to calculate the score:     Age: 11 years     Sex: Female     Is Non-Hispanic African American: Yes     Diabetic: Yes     Tobacco smoker: No     Systolic Blood Pressure: 742 mmHg     Is BP treated: Yes     HDL Cholesterol: 62 mg/dL     Total Cholesterol: 165 mg/dL        09/26/2021    2:59 PM 03/21/2021   11:42 AM 03/22/2020    9:50 AM  Depression screen PHQ 2/9  Decreased Interest 0 0 0  Down, Depressed, Hopeless 0 0 0  PHQ - 2 Score 0 0 0     Social History   Tobacco Use  Smoking Status Never   Passive exposure: Never  Smokeless Tobacco Never   BP Readings from Last 3 Encounters:  12/31/21  120/84  09/26/21 126/78  07/16/21 118/78   Pulse Readings from Last 3 Encounters:  09/26/21 76  06/19/21 84  04/03/21 69   Wt Readings from Last 3 Encounters:  12/31/21 240 lb (108.9 kg)  09/26/21 254 lb (115.2 kg)  07/16/21 251 lb (113.9 kg)   BMI Readings from Last 3 Encounters:  12/31/21 44.47 kg/m  09/26/21 46.46 kg/m  07/16/21 45.91 kg/m    No Known Allergies  Medications Reviewed Today     Reviewed by [name], MD (Physician) on 01/04/22 at [place] List Status: <None>   Medication Order Taking? Sig Documenting Provider Last Dose Status Informant  amLODipine (NORVASC) 10 MG tablet [id number] Yes Take 1 tablet (10 mg total) by mouth daily. [name], MD Taking Active   aspirin 81 MG tablet [id number] Yes Take 81 mg by mouth daily. [provider] Taking Active   atorvastatin (LIPITOR) 40 MG tablet [id number] Yes Take 1 tablet (40 mg total) by mouth daily. [name], MD Taking Active   BD PEN NEEDLE NANO 2ND GEN 32G X 4 MM MISC [id number] Yes USE WITH Thornton Papas, MD Taking Active   Blood Glucose Calibration (OT ULTRA/FASTTK CNTRL Moishe Spice [id number] Yes  [provider] Taking Active   Blood Glucose Monitoring Suppl (ONE TOUCH ULTRA 2) w/Device KIT [id number] Yes Use as directed to check blood sugars 2 times per day dx:e11.22 [name], MD Taking Active   carboxymethylcellulose (REFRESH PLUS) 0.5 % SOLN [id number] Yes Place 1 drop into both eyes daily. [provider] Taking Active Self  chlorthalidone (HYGROTON) 25 MG tablet [id number] Yes Take 25 mg by mouth daily. [provider] Taking Active   cholecalciferol (VITAMIN D3) 25 MCG (1000 UNIT) tablet [id number] Yes Take 1,000 Units by mouth daily. [provider] Taking Active   famotidine (PEPCID) 20 MG tablet [id number] Yes Take 20 mg by mouth daily. [provider] Taking Active   gabapentin (NEURONTIN) 300 MG capsule [id number] Yes Take 1  capsule (300 mg total) by mouth at bedtime. [name], MD Taking Active   hydrALAZINE (APRESOLINE) 25 MG tablet [id number] Yes TAKE 1 TABLET BY MOUTH TWICE A Lynnell Dike, MD Taking Active  insulin degludec (TRESIBA FLEXTOUCH) 100 UNIT/ML SOPN FlexTouch Pen [id number] Yes Inject 16 Units into the skin at bedtime.  [provider] Taking Active   LOKELMA 10 g PACK packet [id number] No Take 10 g by mouth. Every other day  Patient not taking: Reported on 11/01/2021   [provider] Not Taking Active            Med Note Mayford Knife   Fri Nov 01, 2021  9:02 AM) Not currently taking this medication   meclizine (ANTIVERT) 25 MG tablet [id number] Yes TAKE 1 TABLET (25 MG TOTAL) BY MOUTH 3 (THREE) TIMES DAILY AS NEEDED FOR DIZZINESS. [name], MD Taking Active   methimazole (TAPAZOLE) 5 MG tablet [id number] Yes Take 1 tablet (5 mg total) by mouth daily. [name], MD Taking Active   olmesartan [facility]) 40 MG tablet [id number] Yes Take 1 tablet (40 mg total) by mouth daily. [name], MD Taking Expired 12/31/21 8242   OneTouch Delica Lancets 35T MISC [id number] Yes Use as directed to check blood sugars 2 times per day dx: e11.22 [name], MD Taking Active   [facility] ULTRA test strip [id number] Yes USE AS INSTRUCTED TO CHECK BLOOD SUGARS 2 TIMES PER DAY DX: E11.22 [name], MD Taking Active   propranolol ER (INDERAL LA) 120 MG 24 hr capsule [id number] Yes TAKE 1 CAPSULE BY MOUTH EVERY DAY [name], MD Taking Active   Semaglutide, 1 MG/DOSE, 4 MG/3ML SOPN [id number] Yes Inject 1 mg into the skin once a week. [name], MD Taking Active   sodium bicarbonate 650 MG tablet [id number] Yes TAKE 1 TABLET BY MOUTH EVERY DAY [name], MD Taking Active   triamcinolone cream (KENALOG) 0.1 % [id number] Yes Apply 1 application topically 2 (two) times daily. Apply [name], MD Taking Active             SDOH:  (Social Determinants of Health)  assessments and interventions performed: No SDOH Interventions    Flowsheet Row Chronic Care Management from 10/12/2019 in [facility] Chronic Care Management from 09/08/2019 in [facility] Chronic Care Management from 08/04/2019 in [facility] Clinical Support from 03/17/2019 in [facility] Clinical Support from 05/27/2018 in [facility]  SDOH Interventions       Depression Interventions/Treatment  -- -- -- OXB3-5 Score <4 Follow-up Not Indicated PHQ2-9 Score <4 Follow-up Not Indicated  Financial Strain Interventions Other (Comment)  [Will continue to assist with obtaining Ozempic and Tresiba through [employer] patient assistance program] Other (Comment)  [Placed refill requests for [place] and [employer] with [employer] patient assistance program] Other (Comment)  [Will continue to assist with obtaining medications via patient assistance program] -- --       Medication Assistance: Application for Ozempic  medication assistance program. in process.  Anticipated assistance start date 03/2022.  See plan of care for additional detail.  Medication Access: Within the past 30 days, how often has patient missed a dose of medication? None  Is a pillbox or other method used to improve adherence?  She is using a pill box Factors that may affect medication adherence? financial need Are meds synced by current pharmacy? No  Are meds delivered by current pharmacy? No  Does patient experience delays in picking up medications due to transportation concerns? No   [facility] Services Reviewed: Is patient disadvantaged to use [facility]?: No  Current  Rx insurance plan: [employer] Medicare Name and location of Current pharmacy:  [id number]Phone:  [phone]: [phone] [facility] services reviewed with patient today?: No  Patient requests to transfer care to [facility]?: No  Reason patient declined to change pharmacies: Prefers to go out and pick up medications themselves  Compliance/Adherence/Medication fill history: Care Gaps: TdaP  COVID-19 Vaccine  Medicare AWV  Star-Rating Drugs: Ozempic 1 mg Olmesartan 40 mg tablet  Atorvastatin 40 mg tablet    Assessment/Plan   Diabetes (A1c goal <7%) -Controlled -Current medications: Ozempic 1 mg once per week Appropriate, Effective, Safe, Accessible Tresiba - 16 units at night  -Current home glucose readings : she is checking her BS once per day  fasting glucose: 109, 96, 120 -Denies hypoglycemic/hyperglycemic symptoms -Current meal patterns: she is using a sheet  dinner: Rotisserie chicken, rice and butter beans - frozen  drinks: 3 bottles of 12 ounce water  -Current exercise: she is still exercising everyday for about 15 minutes  -Educated on Complications of diabetes including kidney damage, retinal damage, and cardiovascular disease; -Counseled to check feet daily and get yearly eye exams -Recommended to continue current medication Goal to increase water to 4 12 ounce bottles.   GERD (Goal: minimize symptoms of reflux ) -Controlled -Current treatment  Famotidine 20 mg tablet - take 1 tablet daily Appropriate, Effective, Query Safe,  -Medications previously tried: none reported  -Triggering factors: fatty foods and greasy foods  -Hx of Bleeds/ulcers: No -Counseled on small meals, elevating head, and sleeping on left side  Collaborate with PCP to decrease dose to 10 mg every day, or change Ms. Fiorini to Pantoprazole 20 mg tablet daily that does not require renal dosing.   -Collaborated with PCP to renally adjust current medication   VOrlando Penner CPP, PharmD Clinical Pharmacist Practitioner [facility] [phone]

## 2022-03-12 NOTE — Telephone Encounter (Signed)
Document opened in error

## 2022-03-14 ENCOUNTER — Telehealth: Payer: Self-pay

## 2022-03-14 NOTE — Telephone Encounter (Signed)
VMT pt requesting call back to discuss starting PREP

## 2022-03-17 DIAGNOSIS — N1832 Chronic kidney disease, stage 3b: Secondary | ICD-10-CM | POA: Diagnosis not present

## 2022-03-18 ENCOUNTER — Encounter: Payer: Self-pay | Admitting: Internal Medicine

## 2022-03-18 ENCOUNTER — Telehealth: Payer: Self-pay

## 2022-03-18 NOTE — Progress Notes (Addendum)
Refaxed 2024 re-enrollment application to [employer] patient assistance with corrections to income and new income documentation.   [name], [place] Pharmacist Assistant [phone]

## 2022-03-31 ENCOUNTER — Telehealth: Payer: Self-pay

## 2022-03-31 NOTE — Telephone Encounter (Signed)
Patient returned call reference PREP classes.  Called pt back and she is interested and can start with the 04/29/22 class T/TH 230p-345p.  Will call her closer to start of class to schedule intake. Pt agreeable to plan.

## 2022-04-01 ENCOUNTER — Ambulatory Visit: Payer: Medicare Other

## 2022-04-01 NOTE — Progress Notes (Signed)
Care Management & Coordination Services Pharmacy Note  04/01/2022 Name:  Teresa Stanley MRN:  [id number] DOB:  [birth year]  Summary: Patient reports that she is concerned about running out of Ozempic. She did receive a letter that stated she was approved for Ozempic until 02/10/2023   Recommendations/Changes made from today's visit: Recommend patient continue to drink plenty of water and keep visit on 04/03/2022 with PCP team   Follow up plan: Recommend patient continue current medication regimen. Recommend patient continue to drink plenty of water Collaborate with PCP to discuss possibly starting GLP-1 in patient due to CKD     Subjective: Teresa Stanley is an 77 y.o. year old female who is a primary patient of [name], MD.  The care coordination team was consulted for assistance with disease management and care coordination needs.    Engaged with patient by telephone for follow up visit.  Recent office visits: 12/31/2021 PCP OV  11/09/2021 PCP OV   Recent consult visits: 03/31/2022 RN encounter for [facility] visits: None in previous 6 months   Objective:  Lab Results  Component Value Date   CREATININE 1.80 (H) 12/31/2021   BUN 38 (H) 12/31/2021   GFR 39.42 (L) 12/25/2017   EGFR 29 (L) 12/31/2021   GFRNONAA 38 (L) 03/22/2020   GFRAA 43 (L) 03/22/2020   NA 140 12/31/2021   K 5.3 (H) 12/31/2021   CALCIUM 9.8 12/31/2021   CO2 26 12/31/2021   GLUCOSE 77 12/31/2021    Lab Results  Component Value Date/Time   HGBA1C 6.0 (H) 12/31/2021 03:51 PM   HGBA1C 6.5 (H) 09/26/2021 04:02 PM   GFR 39.42 (L) 12/25/2017 03:08 PM   MICROALBUR 150 06/18/2020 12:27 PM   MICROALBUR 80 06/16/2019 02:39 PM    Last diabetic Eye exam:  Lab Results  Component Value Date/Time   HMDIABEYEEXA No Retinopathy 04/02/2021 12:00 AM    Last diabetic Foot exam: No results found for: "HMDIABFOOTEX"   Lab Results  Component Value Date   CHOL 165 07/10/2021   HDL 62  07/10/2021   LDLCALC 86 07/10/2021   TRIG 94 07/10/2021   CHOLHDL 2.7 07/10/2021       Latest Ref Rng & Units 07/10/2021   12:00 AM 04/03/2021   10:51 AM 03/25/2021   12:00 AM  Hepatic Function  Total Protein 6.0 - 8.5 g/dL 6.7  7.1    Albumin 3.7 - 4.7 g/dL 3.8  4.2  4.3      AST 0 - 40 IU/L 16  17    ALT 0 - 32 IU/L 11  12    Alk Phosphatase 44 - 121 IU/L 116  138    Total Bilirubin 0.0 - 1.2 mg/dL 0.3  0.5       This result is from an external source.    Lab Results  Component Value Date/Time   TSH 2.550 12/31/2021 03:51 PM   TSH 2.930 03/22/2020 11:12 AM   FREET4 1.73 03/22/2020 11:12 AM   FREET4 1.33 08/23/2009 07:54 PM       Latest Ref Rng & Units 07/10/2021   12:00 AM 06/18/2020   12:10 PM 03/22/2020   11:12 AM  CBC  WBC 3.4 - 10.8 x10E3/uL 7.2  8.4  7.5   Hemoglobin 11.1 - 15.9 g/dL 11.6  12.9  12.9   Hematocrit 34.0 - 46.6 % 35.6  40.5  41.2   Platelets 150 - 450 x10E3/uL 270  388  305  Lab Results  Component Value Date/Time   [id number] 1,167 04/03/2021 10:51 AM    Clinical ASCVD: No  The 10-year ASCVD risk score (Arnett DK, et al., 2019) is: 27.1%   Values used to calculate the score:     Age: 25 years     Sex: Female     Is Non-Hispanic African American: Yes     Diabetic: Yes     Tobacco smoker: No     Systolic Blood Pressure: 123456 mmHg     Is BP treated: Yes     HDL Cholesterol: 62 mg/dL     Total Cholesterol: 165 mg/dL        09/26/2021    2:59 PM 03/21/2021   11:42 AM 03/22/2020    9:50 AM  Depression screen PHQ 2/9  Decreased Interest 0 0 0  Down, Depressed, Hopeless 0 0 0  PHQ - 2 Score 0 0 0     Social History   Tobacco Use  Smoking Status Never   Passive exposure: Never  Smokeless Tobacco Never   BP Readings from Last 3 Encounters:  12/31/21 120/84  09/26/21 126/78  07/16/21 118/78   Pulse Readings from Last 3 Encounters:  09/26/21 76  06/19/21 84  04/03/21 69   Wt Readings from Last 3 Encounters:  12/31/21 240 lb  (108.9 kg)  09/26/21 254 lb (115.2 kg)  07/16/21 251 lb (113.9 kg)   BMI Readings from Last 3 Encounters:  12/31/21 44.47 kg/m  09/26/21 46.46 kg/m  07/16/21 45.91 kg/m    No Known Allergies  Medications Reviewed Today     Reviewed by [name], MD (Physician) on 01/04/22 at [place] List Status: <None>   Medication Order Taking? Sig Documenting Provider Last Dose Status Informant  amLODipine (NORVASC) 10 MG tablet [id number] Yes Take 1 tablet (10 mg total) by mouth daily. [name], MD Taking Active   aspirin 81 MG tablet [id number] Yes Take 81 mg by mouth daily. [provider] Taking Active   atorvastatin (LIPITOR) 40 MG tablet [id number] Yes Take 1 tablet (40 mg total) by mouth daily. [name], MD Taking Active   BD PEN NEEDLE NANO 2ND GEN 32G X 4 MM MISC [id number] Yes USE WITH Thornton Papas, MD Taking Active   Blood Glucose Calibration (OT ULTRA/FASTTK CNTRL Moishe Spice [id number] Yes  [provider] Taking Active   Blood Glucose Monitoring Suppl (ONE TOUCH ULTRA 2) w/Device KIT [id number] Yes Use as directed to check blood sugars 2 times per day dx:e11.22 [name], MD Taking Active   carboxymethylcellulose (REFRESH PLUS) 0.5 % SOLN [id number] Yes Place 1 drop into both eyes daily. [provider] Taking Active Self  chlorthalidone (HYGROTON) 25 MG tablet [id number] Yes Take 25 mg by mouth daily. [provider] Taking Active   cholecalciferol (VITAMIN D3) 25 MCG (1000 UNIT) tablet [id number] Yes Take 1,000 Units by mouth daily. [provider] Taking Active   famotidine (PEPCID) 20 MG tablet [id number] Yes Take 20 mg by mouth daily. [provider] Taking Active   gabapentin (NEURONTIN) 300 MG capsule [id number] Yes Take 1 capsule (300 mg total) by mouth at bedtime. [name], MD Taking Active   hydrALAZINE (APRESOLINE) 25 MG tablet [id number] Yes TAKE 1 TABLET BY MOUTH TWICE A Lynnell Dike, MD  Taking Active   insulin degludec (TRESIBA FLEXTOUCH) 100 UNIT/ML SOPN FlexTouch Pen [id number] Yes Inject 16 Units into the skin at bedtime.  [provider] Taking Active  LOKELMA 10 g PACK packet [id number] No Take 10 g by mouth. Every other day  Patient not taking: Reported on 11/01/2021   [provider] Not Taking Active            Med Note Mayford Knife   Fri Nov 01, 2021  9:02 AM) Not currently taking this medication   meclizine (ANTIVERT) 25 MG tablet [id number] Yes TAKE 1 TABLET (25 MG TOTAL) BY MOUTH 3 (THREE) TIMES DAILY AS NEEDED FOR DIZZINESS. [name], MD Taking Active   methimazole (TAPAZOLE) 5 MG tablet [id number] Yes Take 1 tablet (5 mg total) by mouth daily. [name], MD Taking Active   olmesartan [facility]) 40 MG tablet [id number] Yes Take 1 tablet (40 mg total) by mouth daily. [name], MD Taking Expired 12/31/21 0000000   OneTouch Delica Lancets 99991111 MISC [id number] Yes Use as directed to check blood sugars 2 times per day dx: e11.22 [name], MD Taking Active   [facility] ULTRA test strip [id number] Yes USE AS INSTRUCTED TO CHECK BLOOD SUGARS 2 TIMES PER DAY DX: E11.22 [name], MD Taking Active   propranolol ER (INDERAL LA) 120 MG 24 hr capsule [id number] Yes TAKE 1 CAPSULE BY MOUTH EVERY DAY [name], MD Taking Active   Semaglutide, 1 MG/DOSE, 4 MG/3ML SOPN [id number] Yes Inject 1 mg into the skin once a week. [name], MD Taking Active   sodium bicarbonate 650 MG tablet [id number] Yes TAKE 1 TABLET BY MOUTH EVERY DAY [name], MD Taking Active   triamcinolone cream (KENALOG) 0.1 % [id number] Yes Apply 1 application topically 2 (two) times daily. Apply [name], MD Taking Active             SDOH:  (Social Determinants of Health) assessments and interventions performed: No SDOH Interventions    Flowsheet Row Chronic Care Management from 10/12/2019 in [facility] Chronic Care  Management from 09/08/2019 in [facility] Chronic Care Management from 08/04/2019 in [facility] Clinical Support from 03/17/2019 in [facility] Clinical Support from 05/27/2018 in [facility]  SDOH Interventions       Depression Interventions/Treatment  -- -- -- [id number] Score <4 Follow-up Not Indicated PHQ2-9 Score <4 Follow-up Not Indicated  Financial Strain Interventions Other (Comment)  [Will continue to assist with obtaining Ozempic and Tresiba through [employer] patient assistance program] Other (Comment)  [Placed refill requests for [place] and [employer] with [employer] patient assistance program] Other (Comment)  [Will continue to assist with obtaining medications via patient assistance program] -- --       Medication Assistance:  Ozempic obtained through [employer]  medication assistance program.  Enrollment ends 02/10/2023  Medication Access: Within the past 30 days, how often has patient missed a dose of medication? No  Is a pillbox or other method used to improve adherence? Yes  Factors that may affect medication adherence? financial need and lack of understanding of disease management Are meds synced by current pharmacy? No  Are meds delivered by current pharmacy? No  Does patient experience delays in picking up medications due to transportation concerns? No   [facility] Services Reviewed: Is patient disadvantaged to use [facility]?: No  Current Rx insurance plan: [place] [employer] Name and location of [facility]  [id number] [address] [id number]: [phone]: [phone]  [facility] services reviewed with patient today?: No  Patient requests to transfer care to [facility]?: No  Reason patient declined to change pharmacies: Not  mentioned at this visit  Compliance/Adherence/Medication fill history: Care Gaps: Tdap COVID-19 Vaccine booster Medicare Annual Wellness Visit  Star-Rating Drugs: Atorvastatin 40 mg tablet daily  Ozempic 1 mg once per week - approved through patient assistance  Olmesartan 40 mg tablet    Assessment/Plan   Diabetes (A1c goal <7%) -Controlled -Current medications: Ozempic 1 mg once per week Appropriate, Effective, Safe, Accessible Tresiba 100 unit/ml - 16 units per day Appropriate, Effective, Safe, Accessible -Current home glucose readings fasting glucose: 90-110 -Denies hypoglycemic/hyperglycemic symptoms -Current meal patterns:  breakfast: [place], bacon and waffle   dinner: cabbage, corn, meatloaf  snacks: sometimes a small pack of popcorn but this very rare drinks: she had 3-16 ounce bottles of water  -Current exercise: her sister gave her a stationary bike, and she is also doing squats.  -Educated on A1c and blood sugar goals; Complications of diabetes including kidney damage, retinal damage, and cardiovascular disease; Benefits of routine self-monitoring of blood sugar; -Counseled to check feet daily and get yearly eye exams -Patient received the approval for Ozempic approved 02/10/2023 medication to be sent in the next 10-14 days, but have not received the shipment yet. Patient to have appointment on 04/03/2022 so patient so will pick up sample then. -Recommended to continue current medication  -Collaborate with PCP to give patient a sample, PCP agreed. We also discussed the opportunity to decrease patients Tresiba based on A1c results at next visit.   [name], CPP, PharmD Clinical Pharmacist Practitioner [facility] [phone]

## 2022-04-02 ENCOUNTER — Telehealth: Payer: Self-pay

## 2022-04-02 NOTE — Telephone Encounter (Signed)
Informed patient that her Ozempic, [name] and Novo fine pen needles are here for her to pick up through patient assistance. She voiced understanding. I also reminded her of her appointment tomorrow with Dr. Baird Cancer and her wellness visit. Patient was appreciative.   [name], CPP, PharmD Clinical Pharmacist Practitioner [facility] [phone]

## 2022-04-03 ENCOUNTER — Ambulatory Visit (INDEPENDENT_AMBULATORY_CARE_PROVIDER_SITE_OTHER): Payer: Medicare Other | Admitting: Internal Medicine

## 2022-04-03 ENCOUNTER — Ambulatory Visit (INDEPENDENT_AMBULATORY_CARE_PROVIDER_SITE_OTHER): Payer: Medicare Other

## 2022-04-03 ENCOUNTER — Encounter: Payer: Self-pay | Admitting: Internal Medicine

## 2022-04-03 VITALS — BP 124/80 | HR 80 | Temp 98.5°F | Ht 61.0 in | Wt 241.8 lb

## 2022-04-03 VITALS — BP 124/80 | HR 87 | Temp 98.5°F | Ht 61.0 in | Wt 241.0 lb

## 2022-04-03 DIAGNOSIS — E782 Mixed hyperlipidemia: Secondary | ICD-10-CM

## 2022-04-03 DIAGNOSIS — Z0000 Encounter for general adult medical examination without abnormal findings: Secondary | ICD-10-CM | POA: Diagnosis not present

## 2022-04-03 DIAGNOSIS — I129 Hypertensive chronic kidney disease with stage 1 through stage 4 chronic kidney disease, or unspecified chronic kidney disease: Secondary | ICD-10-CM | POA: Diagnosis not present

## 2022-04-03 DIAGNOSIS — E1122 Type 2 diabetes mellitus with diabetic chronic kidney disease: Secondary | ICD-10-CM

## 2022-04-03 DIAGNOSIS — N1832 Chronic kidney disease, stage 3b: Secondary | ICD-10-CM | POA: Diagnosis not present

## 2022-04-03 DIAGNOSIS — Z6841 Body Mass Index (BMI) 40.0 and over, adult: Secondary | ICD-10-CM

## 2022-04-03 DIAGNOSIS — Z794 Long term (current) use of insulin: Secondary | ICD-10-CM | POA: Diagnosis not present

## 2022-04-03 NOTE — Progress Notes (Signed)
Subjective:   Teresa Stanley is a 77 y.o. female who presents for Medicare Annual (Subsequent) preventive examination.  Review of Systems     Cardiac Risk Factors include: advanced age (>34mn, >>13women);diabetes mellitus;hypertension;obesity (BMI >30kg/m2)     Objective:    Today's Vitals   04/03/22 1052  BP: 124/80  Pulse: 80  Temp: 98.5 F (36.9 C)  TempSrc: Oral  Weight: 241 lb 12.8 oz (109.7 kg)  Height: 5' 1"$  (1.549 m)   Body mass index is 45.69 kg/m.     04/03/2022   11:00 AM 03/21/2021   11:41 AM 03/22/2020    9:49 AM 03/17/2019    9:45 AM 05/27/2018   11:12 AM 01/16/2016    1:34 PM 11/27/2015    6:06 PM  Advanced Directives  Does Patient Have a Medical Advance Directive? Yes Yes Yes Yes Yes Yes No  Type of AParamed[place]of [place]Living will [place]Living will [place]Living will [place]Living will HContin[place]Living will    Does patient want to make changes to medical advance directive?     No - Patient declined    Copy of [place] Chart? No - copy requested No - copy requested No - copy requested No - copy requested No - copy requested    Would patient like information on creating a medical advance directive?       No - patient declined information    Current Medications (verified) Outpatient Encounter Medications as of 04/03/2022  Medication Sig   amLODipine (NORVASC) 10 MG tablet Take 1 tablet (10 mg total) by mouth daily.   aspirin 81 MG tablet Take 81 mg by mouth daily.   atorvastatin (LIPITOR) 40 MG tablet Take 1 tablet (40 mg total) by mouth daily.   BD PEN NEEDLE NANO 2ND GEN 32G X 4 MM MISC USE WITH TRESIBA   Blood Glucose Calibration (OT ULTRA/FASTTK CNTRL SOLN) SOLN    Blood Glucose Monitoring Suppl (ONE TOUCH ULTRA 2) w/Device KIT Use as directed to check blood sugars 2 times per day dx:e11.22   carboxymethylcellulose  (REFRESH PLUS) 0.5 % SOLN Place 1 drop into both eyes daily.   chlorthalidone (HYGROTON) 25 MG tablet Take 25 mg by mouth daily.   cholecalciferol (VITAMIN D3) 25 MCG (1000 UNIT) tablet Take 1,000 Units by mouth daily.   famotidine (HM FAMOTIDINE) 10 MG tablet Take 1 tablet (10 mg total) by mouth daily.   gabapentin (NEURONTIN) 300 MG capsule TAKE 1 CAPSULE BY MOUTH EVERYDAY AT BEDTIME   hydrALAZINE (APRESOLINE) 25 MG tablet TAKE 1 TABLET BY MOUTH TWICE A DAY   insulin degludec (TRESIBA FLEXTOUCH) 100 UNIT/ML SOPN FlexTouch Pen Inject 16 Units into the skin at bedtime.    meclizine (ANTIVERT) 25 MG tablet TAKE 1 TABLET (25 MG TOTAL) BY MOUTH 3 (THREE) TIMES DAILY AS NEEDED FOR DIZZINESS.   methimazole (TAPAZOLE) 5 MG tablet Take 1 tablet (5 mg total) by mouth daily.   OneTouch Delica Lancets [id number] Use as directed to check blood sugars 2 times per day dx: e11.22   ONETOUCH ULTRA test strip USE AS INSTRUCTED TO CHECK BLOOD SUGARS 2 TIMES PER DAY DX: E11.22   propranolol ER (INDERAL LA) 120 MG 24 hr capsule TAKE 1 CAPSULE BY MOUTH EVERY DAY   Semaglutide, 1 MG/DOSE, 4 MG/3ML SOPN Inject 1 mg into the skin once a week.   sodium bicarbonate 650 MG tablet  TAKE 1 TABLET BY MOUTH EVERY DAY   triamcinolone cream (KENALOG) 0.1 % Apply 1 application topically 2 (two) times daily. Apply   olmesartan (BENICAR) 40 MG tablet Take 1 tablet (40 mg total) by mouth daily.   No facility-administered encounter medications on file as of 04/03/2022.    Allergies (verified) Patient has no known allergies.   History: Past Medical History:  Diagnosis Date   Arthritis    Diabetes mellitus    takes Actos and Metformin daily and Victoza   GERD (gastroesophageal reflux disease)    takes Omeprazole daily   Headache(784.0)    occasionally   Hyperlipidemia    takes Atorvastatin daily   Hypertension    takes Lisinopril,Amlodipine,and Metoprolol daily   Hypothyroidism    Joint pain    Thyroid disease    ?    Past Surgical History:  Procedure Laterality Date   ABDOMINAL HYSTERECTOMY     BREAST BIOPSY Left    BREAST EXCISIONAL BIOPSY Left    CARDIAC CATHETERIZATION  2009   CATARACT EXTRACTION Right    EYE SURGERY     JOINT REPLACEMENT     KNEE ARTHROPLASTY Left 04/04/2013   Procedure: COMPUTER ASSISTED TOTAL KNEE ARTHROPLASTY;  Surgeon: Marybelle Killings, MD;  Location: [facility];  Service: Orthopedics;  Laterality: Left;  Left Total Knee Arthroplasty, Cemented   TOTAL KNEE ARTHROPLASTY Right 11/26/2015   Procedure: TOTAL KNEE ARTHROPLASTY;  Surgeon: Marybelle Killings, MD;  Location: [place];  Service: Orthopedics;  Laterality: Right;   Family History  Problem Relation Age of Onset   Diabetes Mother    Hypertension Mother    Diabetes Father    Asthma Father    Hypertension Father    Breast cancer Sister 2   Hypertension Sister        2   Diabetes Sister    Hypertension Daughter    Hypertension Brother        5   Kidney disease Brother    Colon cancer Neg Hx    Esophageal cancer Neg Hx    Rectal cancer Neg Hx    Stomach cancer Neg Hx    Social History   Socioeconomic History   Marital status: Widowed    Spouse name: Not on file   Number of children: 4   Years of education: Not on file   Highest education level: Not on file  Occupational History   Occupation: retired  Tobacco Use   Smoking status: Never    Passive exposure: Never   Smokeless tobacco: Never  Vaping Use   Vaping Use: Never used  Substance and Sexual Activity   Alcohol use: No   Drug use: No   Sexual activity: Not Currently    Birth control/protection: Surgical  Other Topics Concern   Not on file  Social History Narrative   Not on file   Social Determinants of Health   Financial Resource Strain: Low Risk  (04/03/2022)   Overall Financial Resource Strain (CARDIA)    Difficulty of Paying Living Expenses: Not hard at all  Food Insecurity: No Food Insecurity (04/03/2022)   Hunger Vital Sign    Worried About  Running Out of Food in the Last Year: Never true    [place] in the Last Year: Never true  Transportation Needs: No Transportation Needs (04/03/2022)   PRAPARE - Transportation    Lack of Transportation (Medical): No    Lack of Transportation (Non-Medical): No  Physical Activity: Insufficiently Active (  04/03/2022)   Exercise Vital Sign    Days of Exercise per Week: 7 days    Minutes of Exercise per Session: 20 min  Stress: No Stress Concern Present (04/03/2022)   [place]    Feeling of Stress : Not at all  Social Connections: Not on file    Tobacco Counseling Counseling given: Not Answered   Clinical Intake:  Pre-visit preparation completed: Yes  Pain : No/denies pain     Nutritional Status: BMI > 30  Obese Nutritional Risks: None Diabetes: Yes  How often do you need to have someone help you when you read instructions, pamphlets, or other written materials from your doctor or pharmacy?: 1 - Never  Diabetic? Yes Nutrition Risk Assessment:  Has the patient had any N/V/D within the last 2 months?  No  Does the patient have any non-healing wounds?  No  Has the patient had any unintentional weight loss or weight gain?  No   Diabetes:  Is the patient diabetic?  Yes  If diabetic, was a CBG obtained today?  No  Did the patient bring in their glucometer from home?  No  How often do you monitor your CBG's? daily.   Financial Strains and Diabetes Management:  Are you having any financial strains with the device, your supplies or your medication? Yes .  Does the patient want to be seen by Chronic Care Management for management of their diabetes?  No  Would the patient like to be referred to a Nutritionist or for Diabetic Management?  No   Diabetic Exams:  Diabetic Eye Exam: Overdue for diabetic eye exam. Pt has been advised about the importance in completing this exam. Patient advised to call and  schedule an eye exam. Diabetic Foot Exam: Completed 12/31/2021   Interpreter Needed?: No  Information entered by :: NAllen LPN   Activities of Daily Living    04/03/2022   11:02 AM  In your present state of health, do you have any difficulty performing the following activities:  Hearing? 0  Vision? 0  Difficulty concentrating or making decisions? 0  Walking or climbing stairs? 1  Dressing or bathing? 0  Doing errands, shopping? 0  Preparing Food and eating ? N  Using the Toilet? N  In the past six months, have you accidently leaked urine? N  Do you have problems with loss of bowel control? N  Managing your Medications? N  Managing your Finances? N  Housekeeping or managing your Housekeeping? N    Patient Care Team: [name], MD as PCP - General (Internal Medicine)  Indicate any recent Medical Services you may have received from other than [facility] providers in the past year (date may be approximate).     Assessment:   This is a routine wellness examination for Teresa Stanley.  Hearing/Vision screen Vision Screening - Comments:: Regular eye exams, [facility]  Dietary issues and exercise activities discussed: Current Exercise Habits: Home exercise routine, Type of exercise: Other - see comments (pedaler), Time (Minutes): 15, Frequency (Times/Week): 7, Weekly Exercise (Minutes/Week): 105   Goals Addressed             This Visit's Progress    Patient Stated       04/03/2022, no goals       Depression Screen    04/03/2022   11:02 AM 09/26/2021    2:59 PM 03/21/2021   11:42 AM 03/22/2020    9:50 AM 03/17/2019  9:46 AM 05/27/2018   11:13 AM 02/01/2018   10:19 AM  PHQ 2/9 Scores  PHQ - 2 Score 0 0 0 0 0 0 0  PHQ- 9 Score     0 0     Fall Risk    04/03/2022   11:01 AM 09/26/2021    2:59 PM 03/21/2021   11:42 AM 03/22/2020    9:50 AM 03/17/2019    9:46 AM  Fall Risk   Falls in the past year? 0 0 0 0 0  Number falls in past yr: 0 0     Injury with Fall? 0 0      Risk for fall due to : Medication side effect;Impaired mobility;Impaired balance/gait  Medication side effect Impaired balance/gait;Impaired mobility;Medication side effect Medication side effect  Follow up Falls prevention discussed;Education provided;Falls evaluation completed Falls evaluation completed Falls evaluation completed;Education provided;Falls prevention discussed Falls evaluation completed;Education provided;Falls prevention discussed Education provided;Falls prevention discussed    FALL RISK PREVENTION PERTAINING TO THE HOME:  Any stairs in or around the home? Yes  If so, are there any without handrails?  chairlift Home free of loose throw rugs in walkways, pet beds, electrical cords, etc? Yes  Adequate lighting in your home to reduce risk of falls? Yes   ASSISTIVE DEVICES UTILIZED TO PREVENT FALLS:  Life alert? No  Use of a cane, walker or w/c? Yes  Grab bars in the bathroom? Yes  Shower chair or bench in shower? No  Elevated toilet seat or a handicapped toilet? Yes   TIMED UP AND GO:  Was the test performed? Yes .  Length of time to ambulate 10 feet: 7 sec.   Gait slow and steady with assistive device  Cognitive Function:        04/03/2022   11:03 AM 03/21/2021   11:43 AM 03/22/2020    9:51 AM 03/17/2019    9:47 AM 05/27/2018   11:15 AM  6CIT Screen  What Year? 0 points 0 points 0 points 0 points 0 points  What month? 0 points 0 points 0 points 0 points 0 points  What time? 0 points 0 points 0 points 0 points 0 points  Count back from 20 0 points 0 points 2 points 0 points 0 points  Months in reverse 2 points 2 points 0 points 0 points 2 points  Repeat phrase 0 points 2 points 4 points 2 points 2 points  Total Score 2 points 4 points 6 points 2 points 4 points    Immunizations Immunization History  Administered Date(s) Administered   Fluad Quad(high Dose 65+) 12/26/2020   Influenza, High Dose Seasonal PF 11/12/2017, 11/09/2018, 11/22/2019    Influenza-Unspecified 11/21/2021   PFIZER Comirnaty(Gray Top)Covid-19 Tri-Sucrose Vaccine 08/01/2020   PFIZER(Purple Top)SARS-COV-2 Vaccination 04/04/2019, 04/25/2019, 12/07/2019   PNEUMOCOCCAL CONJUGATE-20 06/19/2021   Pneumococcal Polysaccharide-23 11/29/2015   Tdap 08/11/2017   Unspecified SARS-COV-2 Vaccination 01/13/2022   Zoster Recombinat (Shingrix) 04/03/2021, 07/16/2021    TDAP status: Up to date  Flu Vaccine status: Up to date  Pneumococcal vaccine status: Up to date  Covid-19 vaccine status: Completed vaccines  Qualifies for Shingles Vaccine? Yes   Zostavax completed Yes   Shingrix Completed?: Yes  Screening Tests Health Maintenance  Topic Date Due   COVID-19 Vaccine (6 - 2023-24 season) 03/10/2022   Medicare Annual Wellness (AWV)  03/21/2022   OPHTHALMOLOGY EXAM  04/02/2022   HEMOGLOBIN A1C  07/01/2022   Diabetic kidney evaluation - eGFR measurement  01/01/2023   Diabetic kidney  evaluation - Urine ACR  01/01/2023   FOOT EXAM  01/01/2023   COLONOSCOPY (Pts 45-80yr Insurance coverage will need to be confirmed)  05/02/2024   DTaP/Tdap/Td (2 - Td or Tdap) 08/12/2027   Pneumonia Vaccine 77 Years old  Completed   INFLUENZA VACCINE  Completed   DEXA SCAN  Completed   Hepatitis C Screening  Completed   Zoster Vaccines- Shingrix  Completed   HPV VACCINES  Aged Out    Health Maintenance  Health Maintenance Due  Topic Date Due   COVID-19 Vaccine (6 - 2023-24 season) 03/10/2022   Medicare Annual Wellness (AWV)  03/21/2022   OPHTHALMOLOGY EXAM  04/02/2022    Colorectal cancer screening: Type of screening: Colonoscopy. Completed 05/03/2014. Repeat every 10 years  Mammogram status: Completed 08/01/2021. Repeat every year  Bone Density status: Completed 09/17/2016.  Lung Cancer Screening: (Low Dose CT Chest recommended if Age 24109-80years, 30 pack-year currently smoking OR have quit w/in 15years.) does not qualify.   Lung Cancer Screening Referral: no  Additional  Screening:  Hepatitis C Screening: does qualify; Completed 02/01/2018  Vision Screening: Recommended annual ophthalmology exams for early detection of glaucoma and other disorders of the eye. Is the patient up to date with their annual eye exam?  No  Who is the provider or what is the name of the office in which the patient attends annual [facility] exams[facility] If pt is not established with a provider, would they like to be referred to a provider to establish care? No .   Dental Screening: Recommended annual dental exams for proper oral hygiene  Community Resource Referral / Chronic Care Management: CRR required this visit?  No   CCM required this visit?  No      Plan:     I have personally reviewed and noted the following in the patient's chart:   Medical and social history Use of alcohol, tobacco or illicit drugs  Current medications and supplements including opioid prescriptions. Patient is not currently taking opioid prescriptions. Functional ability and status Nutritional status Physical activity Advanced directives List of other physicians Hospitalizations, surgeries, and ER visits in previous 12 months Vitals Screenings to include cognitive, depression, and falls Referrals and appointments  In addition, I have reviewed and discussed with patient certain preventive protocols, quality metrics, and best practice recommendations. A written personalized care plan for preventive services as well as general preventive health recommendations were provided to patient.     NKellie Simmering LPN   [id number]  Nurse Notes: none

## 2022-04-03 NOTE — Patient Instructions (Signed)

## 2022-04-03 NOTE — Progress Notes (Signed)
I,Victoria T Hamilton,acting as a scribe for Maximino Greenland, MD.,have documented all relevant documentation on the behalf of Maximino Greenland, MD,as directed by  Maximino Greenland, MD while in the presence of Maximino Greenland, MD.    Subjective:     Patient ID: Teresa Stanley , female    DOB: February 08, 1946 , 77 y.o.   MRN: [id number]   Chief Complaint  Patient presents with   Diabetes   Hypertension   Hyperlipidemia    HPI  She presents today for f/u DM/HTN f/u. She reports compliance with meds. She is accompanied by her daughter. She denies having any headaches, chest pain and shortness of breath.   BP Readings from Last 3 Encounters: 04/03/22 : 124/80 04/03/22 : 124/80 12/31/21 : 120/84  She was also seen by [facility] Advisor, Pamala Hurry for AWV.     Diabetes She presents for her follow-up diabetic visit. She has type 2 diabetes mellitus. Her disease course has been stable. There are no hypoglycemic associated symptoms. Pertinent negatives for diabetes include no blurred vision and no chest pain. There are no hypoglycemic complications. Diabetic complications include nephropathy. Risk factors for coronary artery disease include diabetes mellitus, dyslipidemia, obesity, post-menopausal and sedentary lifestyle. She is compliant with treatment some of the time. Her weight is decreasing steadily. She is following a generally healthy diet. She participates in exercise intermittently. Her breakfast blood glucose is taken between 8-9 am. Her breakfast blood glucose range is generally 130-140 mg/dl. An ACE inhibitor/angiotensin II receptor blocker is being taken. Eye exam is current.  Hypertension The current episode started more than 1 year ago. The problem has been gradually improving since onset. The problem is uncontrolled. Pertinent negatives include no blurred vision, chest pain, palpitations or shortness of breath. The current treatment provides moderate improvement. Compliance problems include  exercise.  Hypertensive end-organ damage includes kidney disease. Identifiable causes of hypertension include chronic renal disease.  Hyperlipidemia This is a chronic problem. The problem is uncontrolled. Exacerbating diseases include chronic renal disease, diabetes and obesity. Pertinent negatives include no chest pain or shortness of breath. Current antihyperlipidemic treatment includes statins. The current treatment provides moderate improvement of lipids. Compliance problems include adherence to exercise.  Risk factors for coronary artery disease include diabetes mellitus, dyslipidemia, hypertension, post-menopausal, a sedentary lifestyle and obesity.     Past Medical History:  Diagnosis Date   Arthritis    Diabetes mellitus    takes Actos and Metformin daily and Victoza   GERD (gastroesophageal reflux disease)    takes Omeprazole daily   Headache(784.0)    occasionally   Hyperlipidemia    takes Atorvastatin daily   Hypertension    takes Lisinopril,Amlodipine,and Metoprolol daily   Hypothyroidism    Joint pain    Thyroid disease    ?     Family History  Problem Relation Age of Onset   Diabetes Mother    Hypertension Mother    Diabetes Father    Asthma Father    Hypertension Father    Breast cancer Sister 38   Hypertension Sister        2   Diabetes Sister    Hypertension Daughter    Hypertension Brother        5   Kidney disease Brother    Colon cancer Neg Hx    Esophageal cancer Neg Hx    Rectal cancer Neg Hx    Stomach cancer Neg Hx      Current Outpatient Medications:  amLODipine (NORVASC) 10 MG tablet, Take 1 tablet (10 mg total) by mouth daily., Disp: 90 tablet, Rfl: 2   aspirin 81 MG tablet, Take 81 mg by mouth daily., Disp: , Rfl:    atorvastatin (LIPITOR) 40 MG tablet, Take 1 tablet (40 mg total) by mouth daily., Disp: 90 tablet, Rfl: 2   BD PEN NEEDLE NANO 2ND GEN 32G X 4 MM MISC, USE WITH TRESIBA, Disp: 100 each, Rfl: 10   Blood Glucose Calibration  (OT ULTRA/FASTTK CNTRL SOLN) SOLN, , Disp: , Rfl:    Blood Glucose Monitoring Suppl (ONE TOUCH ULTRA 2) w/Device KIT, Use as directed to check blood sugars 2 times per day dx:e11.22, Disp: 1 each, Rfl: 1   carboxymethylcellulose (REFRESH PLUS) 0.5 % SOLN, Place 1 drop into both eyes daily., Disp: , Rfl:    chlorthalidone (HYGROTON) 25 MG tablet, Take 25 mg by mouth daily., Disp: , Rfl:    cholecalciferol (VITAMIN D3) 25 MCG (1000 UNIT) tablet, Take 1,000 Units by mouth daily., Disp: , Rfl:    famotidine (HM FAMOTIDINE) 10 MG tablet, Take 1 tablet (10 mg total) by mouth daily., Disp: 90 tablet, Rfl: 1   gabapentin (NEURONTIN) 300 MG capsule, TAKE 1 CAPSULE BY MOUTH EVERYDAY AT BEDTIME, Disp: 90 capsule, Rfl: 2   hydrALAZINE (APRESOLINE) 25 MG tablet, TAKE 1 TABLET BY MOUTH TWICE A DAY, Disp: 60 tablet, Rfl: 1   insulin degludec (TRESIBA FLEXTOUCH) 100 UNIT/ML SOPN FlexTouch Pen, Inject 16 Units into the skin at bedtime. , Disp: , Rfl:    meclizine (ANTIVERT) 25 MG tablet, TAKE 1 TABLET (25 MG TOTAL) BY MOUTH 3 (THREE) TIMES DAILY AS NEEDED FOR DIZZINESS., Disp: 30 tablet, Rfl: 0   methimazole (TAPAZOLE) 5 MG tablet, Take 1 tablet (5 mg total) by mouth daily., Disp: 90 tablet, Rfl: 1   olmesartan (BENICAR) 40 MG tablet, Take 1 tablet (40 mg total) by mouth daily., Disp: 90 tablet, Rfl: 2   OneTouch Delica Lancets [id number] MISC, Use as directed to check blood sugars 2 times per day dx: e11.22, Disp: 300 each, Rfl: 2   ONETOUCH ULTRA test strip, USE AS INSTRUCTED TO CHECK BLOOD SUGARS 2 TIMES PER DAY DX: E11.22, Disp: 200 strip, Rfl: 4   propranolol ER (INDERAL LA) 120 MG 24 hr capsule, TAKE 1 CAPSULE BY MOUTH EVERY DAY, Disp: 90 capsule, Rfl: 2   Semaglutide, 1 MG/DOSE, 4 MG/3ML SOPN, Inject 1 mg into the skin once a week., Disp: 3 mL, Rfl: 2   sodium bicarbonate 650 MG tablet, TAKE 1 TABLET BY MOUTH EVERY DAY, Disp: 90 tablet, Rfl: 3   triamcinolone cream (KENALOG) 0.1 %, Apply 1 application topically 2  (two) times daily. Apply, Disp: 45 g, Rfl: 1   No Known Allergies   Review of Systems  Constitutional: Negative.   Eyes:  Negative for blurred vision.  Respiratory: Negative.  Negative for shortness of breath.   Cardiovascular: Negative.  Negative for chest pain and palpitations.  Neurological: Negative.   Psychiatric/Behavioral: Negative.       Today's Vitals   04/03/22 1104  BP: 124/80  Pulse: 87  Temp: 98.5 F (36.9 C)  TempSrc: Oral  Weight: 241 lb (109.3 kg)  Height: '5\' 1"'$  (1.549 m)   Body mass index is 45.54 kg/m.  Wt Readings from Last 3 Encounters:  04/03/22 241 lb (109.3 kg)  04/03/22 241 lb 12.8 oz (109.7 kg)  12/31/21 240 lb (108.9 kg)    Objective:  Physical Exam Vitals and  nursing note reviewed.  Constitutional:      Appearance: Normal appearance.  HENT:     Head: Normocephalic and atraumatic.     Nose:     Comments: Masked     Mouth/Throat:     Comments: Masked  Eyes:     Extraocular Movements: Extraocular movements intact.  Cardiovascular:     Rate and Rhythm: Normal rate and regular rhythm.     Heart sounds: Normal heart sounds.  Pulmonary:     Effort: Pulmonary effort is normal.     Breath sounds: Normal breath sounds.  Skin:    General: Skin is warm.  Neurological:     General: No focal deficit present.     Mental Status: She is alert.  Psychiatric:        Mood and Affect: Mood normal.        Behavior: Behavior normal.         Assessment And Plan:     1. Type 2 diabetes mellitus with stage 3b chronic kidney disease, with long-term current use of insulin (HCC) Comments: Chronic, I will check an a1c today. I will consider increasing Ozempic, in hopes that I will be able to decrease/wean her off of insulin. - Hemoglobin A1c  2. Parenchymal renal hypertension, stage 1 through stage 4 or unspecified chronic kidney disease Comments: Chronic, controlled. No need to change meds.She will c/w olmesartan 40, hydralazine '25mg'$  tid, amlodipine  '10mg'$  qd.  Encouraged to increase daily activity.  3. Mixed hyperlipidemia Comments: Chronic,LDL goal<70. She will c/w atorvastatin. She is encouraged to decrease intake of fried foods and processed meats including bacon, sausages and deli meats - Lipid panel  4. Class 3 severe obesity due to excess calories with serious comorbidity and body mass index (BMI) of 45.0 to 49.9 in adult [facility]) Comments: BMI 45. She is encouraged to increase daily activity level while initially striving for BMI<40 to decrease cardiac risk.     Patient was given opportunity to ask questions. Patient verbalized understanding of the plan and was able to repeat key elements of the plan. All questions were answered to their satisfaction.   I, Maximino Greenland, MD, have reviewed all documentation for this visit. The documentation on 04/03/22 for the exam, diagnosis, procedures, and orders are all accurate and complete.   IF YOU HAVE BEEN REFERRED TO A SPECIALIST, IT MAY TAKE 1-2 WEEKS TO SCHEDULE/PROCESS THE REFERRAL. IF YOU HAVE NOT HEARD FROM US/SPECIALIST IN TWO WEEKS, PLEASE GIVE [place] A CALL AT [phone].   THE PATIENT IS ENCOURAGED TO PRACTICE SOCIAL DISTANCING DUE TO THE COVID-19 PANDEMIC.

## 2022-04-03 NOTE — Patient Instructions (Signed)
Teresa Stanley , Thank you for taking time to come for your Medicare Wellness Visit. I appreciate your ongoing commitment to your health goals. Please review the following plan we discussed and let me know if I can assist you in the future.   These are the goals we discussed:  Goals       I need financial assistance for my medications (pt-stated)      Current Barriers:  Financial Barriers: patient has [employer] and reports copay for Ozempic (>$500/month) is cost prohibitive at this time  Pharmacist Clinical Goal(s):  Over the next 60 days, patient will work with PharmD and providers to relieve medication access concerns (goal updated due to delays in processing with DSS due to [place])  Interventions: Call completed with patient on 01/20/19 Comprehensive medication review completed; medication list updated in electronic medical record.  Ozempic by [employer]: Patient meets income/NO out of pocket spend criteria for this medication's patient assistance program. Reviewed application process. Patient provided proof of income, out of pocket spend report, and will sign application. Will collaborate with primary care provider, Dr. [name] for their portion of application. Once completed, will submit to [employer] patient assistance program. Patient completed application in office.  Application faxed to company for approval.  Company denied application due to income, however patient to apply for LIS/extrahelp through medicare.  Assisted patient with application online per verbal permission.  Patient verbalized understanding.  Consulted with CCM BSW Daneen Schick to assist patient in applying for [employer].  Application submitted week of 11/15/2018.  Can take up to 45 days for reply.  Patient denied for Medicaid/extra help.  Application prepared and submitted for 2021 on 01/20/2019 Achieving this goal will allow patient to continue to control her diabetes.  CCM SW Interventions: Completed  01/13/2019  Outbound call to the patient to review steps once letter received from [facility] Determined the patient has received letter in the mail Advised the patient to bring the letter to her primary providers office over the next week to drop off for embedded PharmD Discussed importance of PharmD receiving letter to assist with patient assistance application for the patients DM medication Collaboration with provider office informing of patients plan to drop letter off this afternoon Collaboration with PharmD to communicate above interventions  Patient Self Care Activities:  Patient will provide necessary portions of application   Please see past updates related to this goal by clicking on the "Past Updates" button in the selected goal         Manage My Medicine      Timeframe:  Long-Range Goal Priority:  High Start Date:                             Expected End Date:                       Follow Up Date 04/30/2022  In Progress: - call for medicine refill 2 or 3 days before it runs out - call if I am sick and can't take my medicine - use a pillbox to sort medicine - use an alarm clock or phone to remind me to take my medicine    Why is this important?   These steps will help you keep on track with your medicines.   Notes:  Try to take your medication at the same time each day Keep up the good work  Patient Stated      03/22/2020, wants to weigh 220 pounds      Patient Stated      03/21/2021, lose weight and wants to travel this summer      Patient Stated      04/03/2022, no goals      Weight (lb) < 200 lb (90.7 kg)      03/17/2019, wants to get down to 220 pounds        This is a list of the screening recommended for you and due dates:  Health Maintenance  Topic Date Due   COVID-19 Vaccine (6 - 2023-24 season) 03/10/2022   Eye exam for diabetics  04/02/2022   Hemoglobin A1C  07/01/2022   Yearly kidney function blood test for diabetes  01/01/2023   Yearly  kidney health urinalysis for diabetes  01/01/2023   Complete foot exam   01/01/2023   Medicare Annual Wellness Visit  04/04/2023   Colon Cancer Screening  05/02/2024   DTaP/Tdap/Td vaccine (2 - Td or Tdap) 08/12/2027   Pneumonia Vaccine  Completed   Flu Shot  Completed   DEXA scan (bone density measurement)  Completed   Hepatitis C Screening: USPSTF Recommendation to screen - Ages 65-79 yo.  Completed   Zoster (Shingles) Vaccine  Completed   HPV Vaccine  Aged Out    Advanced directives: Please bring a copy of your POA (Power of [place]) and/or Living Will to your next appointment.   Conditions/risks identified: none  Next appointment: Follow up in one year for your annual wellness visit    Preventive Care 65 Years and Older, Female Preventive care refers to lifestyle choices and visits with your health care provider that can promote health and wellness. What does preventive care include? A yearly physical exam. This is also called an annual well check. Dental exams once or twice a year. Routine eye exams. Ask your health care provider how often you should have your eyes checked. Personal lifestyle choices, including: Daily care of your teeth and gums. Regular physical activity. Eating a healthy diet. Avoiding tobacco and drug use. Limiting alcohol use. Practicing safe sex. Taking low-dose aspirin every day. Taking vitamin and mineral supplements as recommended by your health care provider. What happens during an annual well check? The services and screenings done by your health care provider during your annual well check will depend on your age, overall health, lifestyle risk factors, and family history of disease. Counseling  Your health care provider may ask you questions about your: Alcohol use. Tobacco use. Drug use. Emotional well-being. Home and relationship well-being. Sexual activity. Eating habits. History of falls. Memory and ability to understand  (cognition). Work and work Statistician. Reproductive health. Screening  You may have the following tests or measurements: Height, weight, and BMI. Blood pressure. Lipid and cholesterol levels. These may be checked every 5 years, or more frequently if you are over 20 years old. Skin check. Lung cancer screening. You may have this screening every year starting at age 39 if you have a 30-pack-year history of smoking and currently smoke or have quit within the past 15 years. Fecal occult blood test (FOBT) of the stool. You may have this test every year starting at age 46. Flexible sigmoidoscopy or colonoscopy. You may have a sigmoidoscopy every 5 years or a colonoscopy every 10 years starting at age 18. Hepatitis C blood test. Hepatitis B blood test. Sexually transmitted disease (STD) testing. Diabetes screening. This is done by checking your blood  sugar (glucose) after you have not eaten for a while (fasting). You may have this done every 1-3 years. Bone density scan. This is done to screen for osteoporosis. You may have this done starting at age 37. Mammogram. This may be done every 1-2 years. Talk to your health care provider about how often you should have regular mammograms. Talk with your health care provider about your test results, treatment options, and if necessary, the need for more tests. Vaccines  Your health care provider may recommend certain vaccines, such as: Influenza vaccine. This is recommended every year. Tetanus, diphtheria, and acellular pertussis (Tdap, Td) vaccine. You may need a Td booster every 10 years. Zoster vaccine. You may need this after age 71. Pneumococcal 13-valent conjugate (PCV13) vaccine. One dose is recommended after age 54. Pneumococcal polysaccharide (PPSV23) vaccine. One dose is recommended after age 38. Talk to your health care provider about which screenings and vaccines you need and how often you need them. This information is not intended to  replace advice given to you by your health care provider. Make sure you discuss any questions you have with your health care provider. Document Released: 02/23/2015 Document Revised: 10/17/2015 Document Reviewed: 11/28/2014 Elsevier Interactive Patient Education  2017 [place] Prevention in the Home Falls can cause injuries. They can happen to people of all ages. There are many things you can do to make your home safe and to help prevent falls. What can I do on the outside of my home? Regularly fix the edges of walkways and driveways and fix any cracks. Remove anything that might make you trip as you walk through a door, such as a raised step or threshold. Trim any bushes or trees on the path to your home. Use bright outdoor lighting. Clear any walking paths of anything that might make someone trip, such as rocks or tools. Regularly check to see if handrails are loose or broken. Make sure that both sides of any steps have handrails. Any raised decks and porches should have guardrails on the edges. Have any leaves, snow, or ice cleared regularly. Use sand or salt on walking paths during winter. Clean up any spills in your garage right away. This includes oil or grease spills. What can I do in the bathroom? Use night lights. Install grab bars by the toilet and in the tub and shower. Do not use towel bars as grab bars. Use non-skid mats or decals in the tub or shower. If you need to sit down in the shower, use a plastic, non-slip stool. Keep the floor dry. Clean up any water that spills on the floor as soon as it happens. Remove soap buildup in the tub or shower regularly. Attach bath mats securely with double-sided non-slip rug tape. Do not have throw rugs and other things on the floor that can make you trip. What can I do in the bedroom? Use night lights. Make sure that you have a light by your bed that is easy to reach. Do not use any sheets or blankets that are too big for  your bed. They should not hang down onto the floor. Have a firm chair that has side arms. You can use this for support while you get dressed. Do not have throw rugs and other things on the floor that can make you trip. What can I do in the kitchen? Clean up any spills right away. Avoid walking on wet floors. Keep items that you use a lot in easy-to-reach  places. If you need to reach something above you, use a strong step stool that has a grab bar. Keep electrical cords out of the way. Do not use floor polish or wax that makes floors slippery. If you must use wax, use non-skid floor wax. Do not have throw rugs and other things on the floor that can make you trip. What can I do with my stairs? Do not leave any items on the stairs. Make sure that there are handrails on both sides of the stairs and use them. Fix handrails that are broken or loose. Make sure that handrails are as long as the stairways. Check any carpeting to make sure that it is firmly attached to the stairs. Fix any carpet that is loose or worn. Avoid having throw rugs at the top or bottom of the stairs. If you do have throw rugs, attach them to the floor with carpet tape. Make sure that you have a light switch at the top of the stairs and the bottom of the stairs. If you do not have them, ask someone to add them for you. What else can I do to help prevent falls? Wear shoes that: Do not have high heels. Have rubber bottoms. Are comfortable and fit you well. Are closed at the toe. Do not wear sandals. If you use a stepladder: Make sure that it is fully opened. Do not climb a closed stepladder. Make sure that both sides of the stepladder are locked into place. Ask someone to hold it for you, if possible. Clearly mark and make sure that you can see: Any grab bars or handrails. First and last steps. Where the edge of each step is. Use tools that help you move around (mobility aids) if they are needed. These  include: Canes. Walkers. Scooters. Crutches. Turn on the lights when you go into a dark area. Replace any light bulbs as soon as they burn out. Set up your furniture so you have a clear path. Avoid moving your furniture around. If any of your floors are uneven, fix them. If there are any pets around you, be aware of where they are. Review your medicines with your doctor. Some medicines can make you feel dizzy. This can increase your chance of falling. Ask your doctor what other things that you can do to help prevent falls. This information is not intended to replace advice given to you by your health care provider. Make sure you discuss any questions you have with your health care provider. Document Released: 11/23/2008 Document Revised: 07/05/2015 Document Reviewed: 03/03/2014 Elsevier Interactive Patient Education  2017 [employer].

## 2022-04-04 LAB — HEMOGLOBIN A1C
Est. average glucose Bld gHb Est-mCnc: 143 mg/dL
Hgb A1c MFr Bld: 6.6 % — ABNORMAL HIGH (ref 4.8–5.6)

## 2022-04-04 LAB — LIPID PANEL
Chol/HDL Ratio: 2.4 ratio (ref 0.0–4.4)
Cholesterol, Total: 168 mg/dL (ref 100–199)
HDL: 70 mg/dL (ref >39)
LDL Chol Calc (NIH): 80 mg/dL (ref 0–99)
Triglycerides: 99 mg/dL (ref 0–149)
VLDL Cholesterol Cal: 18 mg/dL (ref 5–40)

## 2022-04-06 DIAGNOSIS — E782 Mixed hyperlipidemia: Secondary | ICD-10-CM | POA: Insufficient documentation

## 2022-04-19 ENCOUNTER — Telehealth: Payer: Self-pay

## 2022-04-19 NOTE — Telephone Encounter (Signed)
VMT pt reference starting PREP on 04/29/22. Need to schedule 1:1 appt.

## 2022-04-21 ENCOUNTER — Telehealth: Payer: Self-pay

## 2022-04-21 NOTE — Telephone Encounter (Signed)
Call received over weekend from pt requesting call back. Called this am. Confirmed interest in doing PREP on 04/29/22 at [facility].  Can do intake on Wed 04/23/22 at 4pm at [facility]. Will meet her at front desk.

## 2022-04-23 NOTE — Progress Notes (Signed)
[facility] PREP Evaluation  Patient Details  Name: Teresa Stanley MRN: [id number] Date of Birth: [birth year] Age: 77 y.o. PCP: [name], MD  Vitals:   04/23/22 1629  BP: 138/84  Pulse: 77  SpO2: 98%  Weight: 246 lb 3.2 oz (111.7 kg)     [facility] Eval - 04/23/22 1600       [facility] "PREP" Location   [facility] "PREP" Location [facility]      Referral    Referring Provider Gaspar Bidding    Reason for referral Obesitity/Overweight;Hypertension;High Cholesterol    Program Start Date 04/29/22   T/TH 230p-345p x 12 wks     Measurement   Waist Circumference 52 inches    Hip Circumference 56.5 inches    Body fat 50 percent      Information for Trainer   Goals Walk without a cane, come of medications, get more exercise    Current Exercise has bike at home 15 min every other day for 15 min    Orthopedic Concerns Hx of Bil TKR    Pertinent Medical History HTN, DM2, CKD 3, high chol    Current Barriers none    Restrictions/Precautions Fall risk;Assistive device;Diabetic snack before exercise    Medications that affect exercise Medication causing dizziness/drowsiness      Timed Up and Go (TUGS)   Timed Up and Go High risk >13 seconds      Mobility and Daily Activities   I find it easy to walk up or down two or more flights of stairs. 1    I have no trouble taking out the trash. 4    I do housework such as vacuuming and dusting on my own without difficulty. 4    I can easily lift a gallon of milk (8lbs). 4    I can easily walk a mile. 1    I have no trouble reaching into high cupboards or reaching down to pick up something from the floor. 2    I do not have trouble doing out-door work such as Armed forces logistics/support/administrative officer, raking leaves, or gardening. 1      Mobility and Daily Activities   I feel younger than my age. 2    I feel independent. 4    I feel energetic. 2    I live an active life.  4    I feel strong. 2    I feel healthy. 2    I feel active as other people my age. 2      How fit and strong  are you.   Fit and Strong Total Score 35            Past Medical History:  Diagnosis Date   Arthritis    Diabetes mellitus    takes Actos and Metformin daily and Victoza   GERD (gastroesophageal reflux disease)    takes Omeprazole daily   Headache(784.0)    occasionally   Hyperlipidemia    takes Atorvastatin daily   Hypertension    takes Lisinopril,Amlodipine,and Metoprolol daily   Hypothyroidism    Joint pain    Thyroid disease    ?   Past Surgical History:  Procedure Laterality Date   ABDOMINAL HYSTERECTOMY     BREAST BIOPSY Left    BREAST EXCISIONAL BIOPSY Left    CARDIAC CATHETERIZATION  2009   CATARACT EXTRACTION Right    EYE SURGERY     JOINT REPLACEMENT     KNEE ARTHROPLASTY Left 04/04/2013   Procedure: COMPUTER ASSISTED  TOTAL KNEE ARTHROPLASTY;  Surgeon: Marybelle Killings, MD;  Location: [place];  Service: Orthopedics;  Laterality: Left;  Left Total Knee Arthroplasty, Cemented   TOTAL KNEE ARTHROPLASTY Right 11/26/2015   Procedure: TOTAL KNEE ARTHROPLASTY;  Surgeon: Marybelle Killings, MD;  Location: [place];  Service: Orthopedics;  Laterality: Right;   Social History   Tobacco Use  Smoking Status Never   Passive exposure: Never  Smokeless Tobacco Never    Barnett Hatter 04/23/2022, 4:34 PM

## 2022-04-25 ENCOUNTER — Telehealth: Payer: Self-pay

## 2022-04-25 NOTE — Progress Notes (Addendum)
As of 04/03/2022 patient picked up 4 boxes of Ozempic 1 mg, 2 boxes of Tresiba 100 u/ml and 2 boxes of Novofine needles from PCP office.    [name], [place] Pharmacist Assistant [phone]

## 2022-04-28 ENCOUNTER — Telehealth: Payer: Self-pay

## 2022-04-28 NOTE — Progress Notes (Signed)
Care Management & Coordination Services Pharmacy Team  Reason for Encounter: Appointment Reminder  Contacted patient to confirm telephone appointment with [name], PharmD on 04-30-2022 at 10:00. Spoke with patient on 04/28/2022   Do you have any problems getting your medications? No  What is your top health concern you would like to discuss at your upcoming visit? Patient stated no  Have you seen any other providers since your last visit with PCP? No   Chart review: Recent office visits:  04-03-2022 [name], MD. A1C= 6.6.  04-03-2022 Kellie Simmering, LPN. Medicare wellness visit.  Recent consult visits:  04-23-2022 Germain Osgood, RN. PREP class  Hospital visits:  None in previous 6 months  Star Rating Drugs:  Ozempic 1 mg- PAP Olmesartan 40 mg- Last filled 03-27-2022 90 [facility]. Previous 09-16-2021 90 DS. Atorvastatin 40 mg- Last filled 03-21-2022 90 [facility]. Previous 12-21-2021 90 DS.  Care Gaps: Annual wellness visit in last year? Yes Covid booster overdue  If Diabetic: Last eye exam / retinopathy screening: Patient stated 05-2021 Last diabetic foot exam: Patient stated [place] Pharmacist Assistant [phone]

## 2022-04-30 ENCOUNTER — Ambulatory Visit: Payer: Medicare Other

## 2022-04-30 NOTE — Progress Notes (Signed)
Care Management & Coordination Services Pharmacy Note  04/30/2022 Name:  Teresa Stanley MRN:  [id number] DOB:  30-Nov-1945  Summary: Recommend patient be started on SGLT-2i medication due to CKD and diabetes. Collaborate with PCP to start this medication. Reviewed patients chart and noted [facility] had Farxiga 10 mg tablet already on the the patients list. Discussed with patient in great detail and she reports having samples of the medication, but she never started the medicine.  We discussed the importance of discussing her medication regimen with all of her doctors.   Recommendations/Changes made from today's visit: Recommend patient is started on Farxiga 10 mg tablet once per day Recommend patient apply for patient assistance for Farxiga 10 mg tablet daily.   Follow up plan: Patient reports that she is open to starting Farxiga 10 mg tablet once per day and still has samples of medication exp: 11/10/2023, on both boxes with a total of 14 pills.  PCP to collaborate to determine next steps, will send chart information to  review.  If patient is started on Farxiga 10 mg tablet daily, she will need to be coached on the following:  Checking her BS at least twice per day once in the morning, and once 1 hour after a meal, or before bedtime - she can also check it three times per day, due to concern about hypoglycemia Check her BP at least once per day and discuss symptoms of low blood pressure Disucss the importance of drinking plenty of water daily ' Review the symptoms of a UTI  and the importance of hydration and reaching out to the team  Contacting the PCP team immediately if she notices anything different about how she feels  4.   Patient voiced understanding and repeated back that she should not start taking the [place] until a decision has been made  5.   Collaborate with nephrologist to make him aware that the patient has not started taking Farxiga 10 mg tablet daily     Subjective: Teresa Stanley is an 77 y.o. year old female who is a primary patient of [name], MD.  The care coordination team was consulted for assistance with disease management and care coordination needs.    Engaged with patient by telephone for follow up visit.  Recent office visits:  04-03-2022 [name], MD. A1C= 6.6.   04-03-2022 Kellie Simmering, LPN. Medicare wellness visit.   Recent consult visits:  04-23-2022 Germain Osgood, RN. PREP class   Hospital visits:  None in previous 6 months   Star Rating Drugs:  Ozempic 1 mg- PAP Olmesartan 40 mg- Last [facility]-2022 [facility] Previous 09-16-2021 90 DS. Atorvastatin 40 mg- Last filled [facility]-21-2022 [facility] Previous 12-21-2021 90 DS.   Care Gaps: Annual wellness visit in last year? Yes Covid booster overdue  Hospital visits: None in previous 6 months   Objective:  Lab Results  Component Value Date   CREATININE 1.80 (H) 12/31/2021   BUN 38 (H) 12/31/2021   GFR 39.42 (L) 12/25/2017   EGFR 29 (L) 12/31/2021   GFRNONAA 38 (L) 03/22/2020   GFRAA 43 (L) 03/22/2020   NA 140 12/31/2021   K 5.3 (H) 12/31/2021   CALCIUM 9.8 12/31/2021   CO2 26 12/31/2021   GLUCOSE 77 12/31/2021    Lab Results  Component Value Date/Time   HGBA1C 6.6 (H) 04/03/2022 12:09 PM   HGBA1C 6.0 (H) 12/31/2021 03:51 PM   GFR 39.42 (L) 12/25/2017 03:08 PM  MICROALBUR 150 06/18/2020 12:27 PM   MICROALBUR 80 06/16/2019 02:39 PM    Last diabetic Eye exam:  Lab Results  Component Value Date/Time   HMDIABEYEEXA No Retinopathy 04/02/2021 12:00 AM    Last diabetic Foot exam: No results found for: "HMDIABFOOTEX"   Lab Results  Component Value Date   CHOL 168 04/03/2022   HDL 70 04/03/2022   LDLCALC 80 04/03/2022   TRIG 99 04/03/2022   CHOLHDL 2.4 04/03/2022       Latest Ref Rng & Units 07/10/2021   12:00 AM 04/03/2021   10:51 AM 03/25/2021   12:00 AM  Hepatic Function  Total Protein 6.0 - 8.5 g/dL 6.7  7.1     Albumin 3.7 - 4.7 g/dL 3.8  4.2  4.3      AST 0 - 40 IU/L 16  17    ALT 0 - 32 IU/L 11  12    Alk Phosphatase 44 - 121 IU/L 116  138    Total Bilirubin 0.0 - 1.2 mg/dL 0.3  0.5       This result is from an external source.    Lab Results  Component Value Date/Time   TSH 2.550 12/31/2021 03:51 PM   TSH 2.930 03/22/2020 11:12 AM   FREET4 1.73 03/22/2020 11:12 AM   FREET4 1.33 08/23/2009 07:54 PM       Latest Ref Rng & Units 07/10/2021   12:00 AM 06/18/2020   12:10 PM 03/22/2020   11:12 AM  CBC  WBC 3.4 - 10.8 x10E3/uL 7.2  8.4  7.5   Hemoglobin 11.1 - 15.9 g/dL 11.6  12.9  12.9   Hematocrit 34.0 - 46.6 % 35.6  40.5  41.2   Platelets 150 - 450 x10E3/uL 270  388  305     Lab Results  Component Value Date/Time   VITAMINB12 1,167 04/03/2021 10:51 AM    Clinical ASCVD: No  The 10-year ASCVD risk score (Arnett DK, et al., 2019) is: 34.1%   Values used to calculate the score:     Age: 36 years     Sex: Female     Is Non-Hispanic African American: Yes     Diabetic: Yes     Tobacco smoker: No     Systolic Blood Pressure: 0000000 mmHg     Is BP treated: Yes     HDL Cholesterol: 70 mg/dL     Total Cholesterol: 168 mg/dL         04/03/2022   11:02 AM 09/26/2021    2:59 PM 03/21/2021   11:42 AM  Depression screen PHQ 2/9  Decreased Interest 0 0 0  Down, Depressed, Hopeless 0 0 0  PHQ - 2 Score 0 0 0     Social History   Tobacco Use  Smoking Status Never   Passive exposure: Never  Smokeless Tobacco Never   BP Readings from Last 3 Encounters:  04/23/22 138/84  04/03/22 124/80  04/03/22 124/80   Pulse Readings from Last 3 Encounters:  04/23/22 77  04/03/22 87  04/03/22 80   Wt Readings from Last 3 Encounters:  04/23/22 246 lb 3.2 oz (111.7 kg)  04/03/22 241 lb (109.3 kg)  04/03/22 241 lb 12.8 oz (109.7 kg)   BMI Readings from Last 3 Encounters:  04/23/22 46.52 kg/m  04/03/22 45.54 kg/m  04/03/22 45.69 kg/m    No Known Allergies  Medications Reviewed  Today     Reviewed by [name], MD (Physician) on 04/03/22 at 1153  Med List Status: <None>   Medication Order Taking? Sig Documenting Provider Last Dose Status Informant  amLODipine (NORVASC) 10 MG tablet [id number] No Take 1 tablet (10 mg total) by mouth daily. [name], MD Taking Active   aspirin 81 MG tablet [id number] No Take 81 mg by mouth daily. [provider] Taking Active   atorvastatin (LIPITOR) 40 MG tablet [id number] No Take 1 tablet (40 mg total) by mouth daily. [name], MD Taking Active   BD PEN NEEDLE NANO 2ND GEN 32G X 4 MM MISC [id number] No USE WITH Thornton Papas, MD Taking Active   Blood Glucose Calibration (OT ULTRA/FASTTK CNTRL SOLN) SOLN [id number] No  [provider] Taking Active   Blood Glucose Monitoring Suppl (ONE TOUCH ULTRA 2) w/Device KIT [id number] No Use as directed to check blood sugars 2 times per day dx:e11.22 [name], MD Taking Active   carboxymethylcellulose (REFRESH PLUS) 0.5 % SOLN [id number] No Place 1 drop into both eyes daily. [provider] Taking Active Self  chlorthalidone (HYGROTON) 25 MG tablet [id number] No Take 25 mg by mouth daily. [provider] Taking Active   cholecalciferol (VITAMIN D3) 25 MCG (1000 UNIT) tablet [id number] No Take 1,000 Units by mouth daily. [provider] Taking Active   famotidine (HM FAMOTIDINE) 10 MG tablet [id number] No Take 1 tablet (10 mg total) by mouth daily. [name], MD Taking Active   gabapentin (NEURONTIN) 300 MG capsule [id number] No TAKE 1 CAPSULE BY MOUTH EVERYDAY AT BEDTIME [name], MD Taking Active   hydrALAZINE (APRESOLINE) 25 MG tablet [id number] No TAKE 1 TABLET BY MOUTH TWICE A Lynnell Dike, MD Taking Active   insulin degludec (TRESIBA FLEXTOUCH) 100 UNIT/ML SOPN FlexTouch Pen [id number] No Inject 16 Units into the skin at bedtime.  [provider] Taking Active   meclizine (ANTIVERT) 25 MG tablet [id number] No  TAKE 1 TABLET (25 MG TOTAL) BY MOUTH 3 (THREE) TIMES DAILY AS NEEDED FOR DIZZINESS. [name], MD Taking Active   methimazole (TAPAZOLE) 5 MG tablet [id number] No Take 1 tablet (5 mg total) by mouth daily. [name], MD Taking Active   olmesartan [facility]) 40 MG tablet [id number] No Take 1 tablet (40 mg total) by mouth daily. [name], MD Taking Expired 12/31/21 0000000   OneTouch Delica Lancets 99991111 MISC [id number] No Use as directed to check blood sugars 2 times per day dx: e11.22 [name], MD Taking Active   [facility] ULTRA test strip [id number] No USE AS INSTRUCTED TO CHECK BLOOD SUGARS 2 TIMES PER DAY DX: E11.22 [name], MD Taking Active   propranolol ER (INDERAL LA) 120 MG 24 hr capsule [id number] No TAKE 1 CAPSULE BY MOUTH EVERY DAY [name], MD Taking Active   Semaglutide, 1 MG/DOSE, 4 MG/3ML SOPN [id number] No Inject 1 mg into the skin once a week. [name], MD Taking Active   sodium bicarbonate 650 MG tablet [id number] No TAKE 1 TABLET BY MOUTH EVERY DAY [name], MD Taking Active   triamcinolone cream (KENALOG) 0.1 % [id number] No Apply 1 application topically 2 (two) times daily. Apply [name], MD Taking Active             SDOH:  (Social Determinants of Health) assessments and interventions performed: No SDOH Interventions    Flowsheet Row Clinical Support from 04/03/2022 in [facility] Chronic Care Management from 10/12/2019 in [facility] Chronic Care Management from 09/08/2019 in [facility] Chronic Care Management from 08/04/2019 in [facility] Clinical Support from 03/17/2019 in [facility] Clinical Support from 05/27/2018 in [facility]  SDOH Interventions        Food Insecurity Interventions Intervention Not Indicated -- -- -- -- --   Transportation Interventions Intervention Not Indicated -- -- -- -- --  Depression Interventions/Treatment  -- -- -- -- PHQ2-9 Score <4 Follow-up Not Indicated PHQ2-9 Score <4 Follow-up Not Indicated  Financial Strain Interventions Intervention Not Indicated Other (Comment)  [Will continue to assist with obtaining Ozempic and [place] through [employer] patient assistance program] Other (Comment)  [Placed refill requests for [place] and [employer] with [employer] patient assistance program] Other (Comment)  [Will continue to assist with obtaining medications via patient assistance program] -- --  Physical Activity Interventions Intervention Not Indicated -- -- -- -- --  Stress Interventions Intervention Not Indicated -- -- -- -- --       Medication Assistance:  Ozempic obtained through [place] [employer]  medication assistance program.  Enrollment ends 01/2023  Medication Access: Within the past 30 days, how often has patient missed a dose of medication? Yes  Is a pillbox or other method used to improve adherence? Yes  Factors that may affect medication adherence? nonadherence to medications and lack of understanding of disease management Are meds synced by current pharmacy? No  Are meds delivered by current pharmacy? No  Does patient experience delays in picking up medications due to transportation concerns? No   [facility] Services Reviewed: Is patient disadvantaged to use [facility]?: No  Current Rx insurance plan: [place] [employer] [facility] and location of [id number] [address] [address] Phone: [phone] Fax: [phone]  [facility] services reviewed with patient today?: No  Patient requests to transfer care to [facility]?: No  Reason patient declined to change pharmacies: Not mentioned at this visit  Compliance/Adherence/Medication fill history: Care Gaps: COVID-19  Vaccine Booster Ophthalmology Exam - supposed to be this month, she is going to reach out to Dr. Katy Fitch today to schedule her appointment.   Star-Rating Drugs: Atorvastatin 40 mg tablet  Olmesartan 40 mg tablet  Ozempic 1 mg once per week    Assessment/Plan   Diabetes (A1c goal <7%) -Controlled -Current medications: Ozempic 1 mg once per week Appropriate, Effective, Safe, Accessible Tresiba Flex Touch 100 unit/ ml - injecting 16 units a bedtime Appropriate, Effective, Safe, Accessible -Current home glucose readings: she is checking it once per day  fasting glucose: 95, 120  -Denies hypoglycemic/hyperglycemic symptoms -Current meal patterns:  breakfast: eating more cereal: special K and oatmeal, and once per week she has sausage or bacon   lunch:  soup  dinner: pork chops, string beans and rice  snacks: oatmeal raisin cookie  drinks: water - 4 bottles of water "10 ounces of water per bottle", she also had green tea with no sugar  -Current exercise: she is going to start back going to the [facility] on Tuesday and Thursdays. She is going to be in the PREP program. She is there for at least an hour for the exercise.  -Educated on Carbohydrate counting and/or plate method Counseled to check feet daily and get yearly eye exams -Ms. Andrea is going to call Dr. Katy Fitch today to confirm her appointment is scheduled for her diabetic eye exam -Patient reports that she received a sample  of Farxiga from her kidney doctor but she never took the medication.  Ms. Lager was able to tell me that she had two boxes of samples, and they expire on 10/2023.  -She remembered receiving the samples but she forgot about taking them. When I mentioned them today she remembered the conversation but she never started the medication.  -Counseled to check feet daily and get yearly eye exams -Recommended patient be start on Farxiga 10 mg tablet once per day, due to CKD. Patient already has samples  Collaborated with PCP to  start patient on Farxiga 10 mg tablet once per day, of note patient was started on Farxiga by the specialist but she never started the medication.  Assessed patient finances. Determined patient was eligible for patient assistance if agreed upon by PCP      [name], CPP, PharmD Clinical Pharmacist Practitioner [facility] [phone]

## 2022-05-01 ENCOUNTER — Telehealth: Payer: Self-pay

## 2022-05-01 NOTE — Telephone Encounter (Signed)
Call from pt this am. Unable to come to class due to vertigo.  Missed 1st class due to daughter had vehicle.   Called pt. [place] with vertigo today. She did check BP which per her report was okay.  Sts has had vertigo before.  Asked her to check in with her doctor today and let her know she has vertigo again.   Pt still wants to participate in PREP.  Will plan on seeing her Tuesday next week and make up the 1st week.

## 2022-05-08 DIAGNOSIS — H04123 Dry eye syndrome of bilateral lacrimal glands: Secondary | ICD-10-CM | POA: Diagnosis not present

## 2022-05-08 DIAGNOSIS — Z961 Presence of intraocular lens: Secondary | ICD-10-CM | POA: Diagnosis not present

## 2022-05-08 DIAGNOSIS — H353111 Nonexudative age-related macular degeneration, right eye, early dry stage: Secondary | ICD-10-CM | POA: Diagnosis not present

## 2022-05-08 DIAGNOSIS — H43821 Vitreomacular adhesion, right eye: Secondary | ICD-10-CM | POA: Diagnosis not present

## 2022-05-08 DIAGNOSIS — E119 Type 2 diabetes mellitus without complications: Secondary | ICD-10-CM | POA: Diagnosis not present

## 2022-05-08 LAB — HM DIABETES EYE EXAM

## 2022-05-13 ENCOUNTER — Other Ambulatory Visit: Payer: Self-pay | Admitting: Internal Medicine

## 2022-05-14 ENCOUNTER — Encounter: Payer: Self-pay | Admitting: Internal Medicine

## 2022-05-20 NOTE — Progress Notes (Signed)
[employer] PREP Weekly Session  Patient Details  Name: Teresa Stanley MRN: [id number] Date of Birth: 1945/08/19 Age: 77 y.o. PCP: Dorothyann Peng, MD  Vitals:   05/20/22 1430  Weight: 238 lb 9.6 oz (108.2 kg)     [facility] Weekly seesion - 05/20/22 1700       [facility] "PREP" Location   [facility]" [facility] [employer]      Weekly Session   Topic Discussed Healthy eating tips    Minutes exercised this week 73 minutes    Classes attended to date 3             Pam Jerral Bonito 05/20/2022, 5:07 PM

## 2022-05-28 ENCOUNTER — Telehealth: Payer: Self-pay

## 2022-05-28 NOTE — Progress Notes (Addendum)
[employer] patient assistance program notification:  120- day supply of Tresiba, Novofine needles and Ozempic will be filled on 06/21/2022 and should arrive to the office in 10-14 business days. Patient  enrollment will expire on 02/10/2023.    Billee Cashing, CMA Clinical Pharmacist Assistant [phone]

## 2022-05-30 ENCOUNTER — Telehealth: Payer: Self-pay

## 2022-05-30 NOTE — Progress Notes (Signed)
Care Management & Coordination Services Pharmacy Team  Reason for Encounter: Appointment Reminder  Contacted patient to confirm telephone appointment with [name], PharmD on 06-03-2022 at 10:00. Unsuccessful outreach. Left voicemail for patient to return call.   Chart review: Recent office visits:  None  Recent consult visits:  05-20-2022 Filiberto Pinks, RN [facility]). PREP class  Hospital visits:  None in previous 6 months   Star Rating Drugs:  Ozempic 1 mg- PAP Olmesartan 40 mg- [facility] Previous 09-16-2021 90 DS. Atorvastatin 40 mg- Last filled [facility]-21-2022 [facility]. Previous 12-21-2021 90 DS.  Care Gaps: Annual wellness visit in last year? Yes Covid booster overdue   If Diabetic: Last eye exam / retinopathy screening:Patient stated 05-2021  Last diabetic foot exam: Patient stated 2023    Huey Romans [facility] Clinical Pharmacist Assistant [phone]

## 2022-06-03 ENCOUNTER — Ambulatory Visit: Payer: Medicare Other

## 2022-06-03 DIAGNOSIS — Z79899 Other long term (current) drug therapy: Secondary | ICD-10-CM

## 2022-06-03 NOTE — Progress Notes (Signed)
Care Management & Coordination Services Pharmacy Note  06/03/2022 Name:  Teresa Stanley MRN:  [id number] DOB:  [birth year]  Summary: Patient to complete Farxiga patient assistance application and start Farxiga after visit on 06/13/2022. Baseline CMP to be completed tomorrow, with follow up CMP in June during her office visit with PCP.   Recommendations/Changes made from today's visit:  Ms. Marrs is going to start taking Farxiga 10 mg tablet once per day starting in the next two weeks. Recommend CMP is completed for patient prior to initiation of [place].  Recommend Ms. Waldorf write questions down that she might have for doctors prior to her office visits.  Follow up plan: Patient to start Farxiga 10 mg tablet once per day on 06/16/2022.  She is going to come in on Wednesday 06/04/2022 at 11 am to sign patient assistance application for Farxiga, and have CMP drawn  Continue to drink plenty of water daily at least 7 bottles per day Ms. Novella is going to discuss her enjoyment of vegetables with the kidney specialist because some of the Vitamin K rich vegetables are her favorite.    Subjective: Teresa Stanley is an 77 y.o. year old female who is a primary patient of Dorothyann Peng, MD.  The care coordination team was consulted for assistance with disease management and care coordination needs.    Engaged with patient by telephone for follow up visit.   Recent office visits:  None   Recent consult visits:  05-20-2022 Filiberto Pinks, RN [facility]). PREP class   Hospital visits:  None in previous 6 months   Objective:  Lab Results  Component Value Date   CREATININE 1.80 (H) 12/31/2021   BUN 38 (H) 12/31/2021   GFR 39.42 (L) 12/25/2017   EGFR 29 (L) 12/31/2021   GFRNONAA 38 (L) 03/22/2020   GFRAA 43 (L) 03/22/2020   NA 140 12/31/2021   K 5.3 (H) 12/31/2021   CALCIUM 9.8 12/31/2021   CO2 26 12/31/2021   GLUCOSE 77 12/31/2021    Lab Results  Component Value Date/Time   HGBA1C 6.6  (H) 04/03/2022 12:09 PM   HGBA1C 6.0 (H) 12/31/2021 03:51 PM   GFR 39.42 (L) 12/25/2017 03:08 PM   MICROALBUR 150 06/18/2020 12:27 PM   MICROALBUR 80 06/16/2019 02:39 PM    Last diabetic Eye exam:  Lab Results  Component Value Date/Time   HMDIABEYEEXA No Retinopathy 05/08/2022 07:49 AM    Last diabetic Foot exam: No results found for: "HMDIABFOOTEX"   Lab Results  Component Value Date   CHOL 168 04/03/2022   HDL 70 04/03/2022   LDLCALC 80 04/03/2022   TRIG 99 04/03/2022   CHOLHDL 2.4 04/03/2022       Latest Ref Rng & Units 07/10/2021   12:00 AM 04/03/2021   10:51 AM 03/25/2021   12:00 AM  Hepatic Function  Total Protein 6.0 - 8.5 g/dL 6.7  7.1    Albumin 3.7 - 4.7 g/dL 3.8  4.2  4.3      AST 0 - 40 IU/L 16  17    ALT 0 - 32 IU/L 11  12    Alk Phosphatase 44 - 121 IU/L 116  138    Total Bilirubin 0.0 - 1.2 mg/dL 0.3  0.5       This result is from an external source.    Lab Results  Component Value Date/Time   TSH 2.550 12/31/2021 03:51 PM   TSH 2.930 03/22/2020 11:12 AM   FREET4 1.73 03/22/2020  11:12 AM   FREET4 1.33 08/23/2009 07:54 PM       Latest Ref Rng & Units 07/10/2021   12:00 AM 06/18/2020   12:10 PM 03/22/2020   11:12 AM  CBC  WBC 3.4 - 10.8 x10E3/uL 7.2  8.4  7.5   Hemoglobin 11.1 - 15.9 g/dL 60.4  54.0  98.1   Hematocrit 34.0 - 46.6 % 35.6  40.5  41.2   Platelets 150 - 450 x10E3/uL 270  388  305     Lab Results  Component Value Date/Time   VITAMINB12 1,167 04/03/2021 10:51 AM    Clinical ASCVD: No  The 10-year ASCVD risk score (Arnett DK, et al., 2019) is: 34.1%   Values used to calculate the score:     Age: 77 years     Sex: Female     Is Non-Hispanic African American: Yes     Diabetic: Yes     Tobacco smoker: No     Systolic Blood Pressure: 138 mmHg     Is BP treated: Yes     HDL Cholesterol: 70 mg/dL     Total Cholesterol: 168 mg/dL        1/91/4782   95:62 AM 09/26/2021    2:59 PM 03/21/2021   11:42 AM  Depression screen PHQ 2/9   Decreased Interest 0 0 0  Down, Depressed, Hopeless 0 0 0  PHQ - 2 Score 0 0 0     Social History   Tobacco Use  Smoking Status Never   Passive exposure: Never  Smokeless Tobacco Never   BP Readings from Last 3 Encounters:  04/23/22 138/84  04/03/22 124/80  04/03/22 124/80   Pulse Readings from Last 3 Encounters:  04/23/22 77  04/03/22 87  04/03/22 80   Wt Readings from Last 3 Encounters:  05/20/22 238 lb 9.6 oz (108.2 kg)  04/23/22 246 lb 3.2 oz (111.7 kg)  04/03/22 241 lb (109.3 kg)   BMI Readings from Last 3 Encounters:  05/20/22 45.08 kg/m  04/23/22 46.52 kg/m  04/03/22 45.54 kg/m    No Known Allergies  Medications Reviewed Today     Reviewed by Dorothyann Peng, MD (Physician) on 05/14/22 at 2056  Med List Status: <None>   Medication Order Taking? Sig Documenting Provider Last Dose Status Informant  amLODipine (NORVASC) 10 MG tablet [id number] No Take 1 tablet (10 mg total) by mouth daily. Dorothyann Peng, MD Taking Active   aspirin 81 MG tablet [id number] No Take 81 mg by mouth daily. [provider] Taking Active   atorvastatin (LIPITOR) 40 MG tablet [id number] No Take 1 tablet (40 mg total) by mouth daily. Dorothyann Peng, MD Taking Active   BD PEN NEEDLE NANO 2ND GEN 32G X 4 MM MISC [id number] No USE WITH Felipa Eth, MD Taking Active   Blood Glucose Calibration (OT ULTRA/FASTTK CNTRL SOLN) SOLN [id number] No  [provider] Taking Active   Blood Glucose Monitoring Suppl (ONE TOUCH ULTRA 2) w/Device KIT [id number] No Use as directed to check blood sugars 2 times per day dx:e11.22 Dorothyann Peng, MD Taking Active   carboxymethylcellulose (REFRESH PLUS) 0.5 % SOLN [id number] No Place 1 drop into both eyes daily. [provider] Taking Active Self  chlorthalidone (HYGROTON) 25 MG tablet [id number] No Take 25 mg by mouth daily. [provider] Taking Active   cholecalciferol (VITAMIN D3) 25 MCG (1000 UNIT) tablet [id number]  No Take 1,000 Units by mouth daily. [provider] Taking Active   famotidine (HM  FAMOTIDINE) 10 MG tablet [id number] No Take 1 tablet (10 mg total) by mouth daily. Dorothyann Peng, MD Taking Active   gabapentin (NEURONTIN) 300 MG capsule [id number] No TAKE 1 CAPSULE BY MOUTH EVERYDAY AT BEDTIME Dorothyann Peng, MD Taking Active   hydrALAZINE (APRESOLINE) 25 MG tablet [id number] No TAKE 1 TABLET BY MOUTH TWICE A Sharyon Medicus, MD Taking Active   insulin degludec (TRESIBA FLEXTOUCH) 100 UNIT/ML SOPN FlexTouch Pen [id number] No Inject 16 Units into the skin at bedtime.  [provider] Taking Active   meclizine (ANTIVERT) 25 MG tablet [id number] No TAKE 1 TABLET (25 MG TOTAL) BY MOUTH 3 (THREE) TIMES DAILY AS NEEDED FOR DIZZINESS. Dorothyann Peng, MD Taking Active   methimazole (TAPAZOLE) 5 MG tablet [id number]  TAKE 1 TABLET (5 MG TOTAL) BY MOUTH DAILY. Dorothyann Peng, MD  Active   olmesartan [facility]) 40 MG tablet [id number] No Take 1 tablet (40 mg total) by mouth daily. Dorothyann Peng, MD Taking Expired 12/31/21 2359   OneTouch Delica Lancets 33G MISC [id number] No Use as directed to check blood sugars 2 times per day dx: e11.22 Dorothyann Peng, MD Taking Active   [facility] ULTRA test strip [id number] No USE AS INSTRUCTED TO CHECK BLOOD SUGARS 2 TIMES PER DAY DX: E11.22 Dorothyann Peng, MD Taking Active   propranolol ER (INDERAL LA) 120 MG 24 hr capsule [id number] No TAKE 1 CAPSULE BY MOUTH EVERY DAY Dorothyann Peng, MD Taking Active   Semaglutide, 1 MG/DOSE, 4 MG/3ML SOPN [id number] No Inject 1 mg into the skin once a week. Dorothyann Peng, MD Taking Active   sodium bicarbonate 650 MG tablet [id number] No TAKE 1 TABLET BY MOUTH EVERY DAY Dorothyann Peng, MD Taking Active   triamcinolone cream (KENALOG) 0.1 % [id number] No Apply 1 application topically 2 (two) times daily. Apply Dorothyann Peng, MD Taking Active             SDOH:  (Social Determinants of Health) assessments and interventions  performed: Yes SDOH Interventions    Flowsheet Row Clinical Support from 04/03/2022 in [facility] Chronic Care Management from 10/12/2019 in [facility] Chronic Care Management from 09/08/2019 in [facility] Chronic Care Management from 08/04/2019 in [facility] Clinical Support from 03/17/2019 in [facility] Clinical Support from 05/27/2018 in [facility]  SDOH Interventions        Food Insecurity Interventions Intervention Not Indicated -- -- -- -- --  Transportation Interventions Intervention Not Indicated -- -- -- -- --  Depression Interventions/Treatment  -- -- -- -- PHQ2-9 Score <4 Follow-up Not Indicated PHQ2-9 Score <4 Follow-up Not Indicated  Financial Strain Interventions Intervention Not Indicated Other (Comment)  [Will continue to assist with obtaining Ozempic and [place] through [employer] patient assistance program] Other (Comment)  [Placed refill requests for [place] and [employer] with [employer] patient assistance program] Other (Comment)  [Will continue to assist with obtaining medications via patient assistance program] -- --  Physical Activity Interventions Intervention Not Indicated -- -- -- -- --  Stress Interventions Intervention Not Indicated -- -- -- -- --       Medication Assistance:  Evaristo Bury, Ozempic obtained through [employer]  medication assistance program.  Enrollment ends 01/2023  Medication Access: Within the past 30 days, how often has patient missed a dose of medication? She reports that she has not missed the dose  Is a pillbox  or other method used to improve adherence? Yes  - she is using 7 day Factors that may affect medication adherence? nonadherence to medications and lack of understanding of disease management Are meds synced by current pharmacy? No   Are meds delivered by current pharmacy? No  Does patient experience delays in picking up medications due to transportation concerns? No   [facility] Services Reviewed: Is patient disadvantaged to use [facility]?: No  Current Rx insurance plan: [facility] Medicare  Name and location of Current pharmacy:  [facility] [phone] [place], [place] - [address] [place] [address] Phone: [phone] Fax: [phone]  [facility] services reviewed with patient today?: No   Compliance/Adherence/Medication fill history: Care Gaps: Annual wellness visit in last year? Yes Covid booster overdue - completed at [facility] Drugs: Ozempic 1 mg- PAP Olmesartan 40 mg- Last filled [facility]-27-2022 [facility] Previous 09-16-2021 90 DS. Atorvastatin 40 mg- Last filled [facility]-21-2022 [facility] Previous 12-21-2021 90 DS.   Assessment/Plan   Diabetes (A1c goal <7%) -Controlled -Current medications: Tresiba - inject 16 units once per day Appropriate, Effective, Safe, Accessible Ozempic 1 mg once per week on Wednesday Appropriate, Effective, Safe, Accessible  Farxiga 10 mg tablet once per day Appropriate, Effective, Safe, Query accessible -Current home glucose readings fasting glucose: 96, 116, 102,103 -Denies hypoglycemic/hyperglycemic symptoms -Current meal patterns:  breakfast: yogurt   lunch: [place] sandwich- from sliced Deli meat, lettuce and a little mayo and water   dinner: asparagus, salmon, rice  snacks: 12 cheeze-it's   drinks: Water at least 6 bottles - 12 ounces  -Current [facility]: [facility]. On Tuesday she goes to class, where she learns about how much sugar she is supposed to have per day, and then she goes to the gym the next day to do chair exercises. She reports that she is not as stiff and she get more done and do more housework. She is now exercising at least 30 minutes per day  .  -Revisited with patient starting Farxiga 10 mg tablet once per day, she reports that she would like to start the medication, and she is going to come by tomorrow to sign patient assistance application  -Educated on A1c and blood sugar goals; Exercise goal of 150 minutes per week; Counseled to check feet daily and get yearly eye exams -Explained the importance of keeping her appointments with her providers, her next appointment with PCP on 07/21/2022 patient to have 30-day CMP completed after starting Farxiga 10 mg tablet.  -Recommended to continue current medication Collaborated with PCP team to complete patient assistance application, and review current medication list to add Farxiga 10 mg tablet once per day to her chart- was started by nephrologist but patient wanted confirmation that it was okay for her take it.   Patient to have lab work completed prior to initiation of new therapy, CMP to be completed on tomorrow, and labs to be completed on 07/21/2022 during her appointment with PCP.   [name], CPP, PharmD Clinical Pharmacist Practitioner [facility] [phone]

## 2022-06-04 ENCOUNTER — Other Ambulatory Visit: Payer: Medicare Other

## 2022-06-04 DIAGNOSIS — Z79899 Other long term (current) drug therapy: Secondary | ICD-10-CM | POA: Diagnosis not present

## 2022-06-04 LAB — COMPREHENSIVE METABOLIC PANEL
ALT: 11 IU/L (ref 0–32)
AST: 15 IU/L (ref 0–40)
Albumin/Globulin Ratio: 1.4 (ref 1.2–2.2)
Albumin: 4 g/dL (ref 3.8–4.8)
Alkaline Phosphatase: 129 IU/L — ABNORMAL HIGH (ref 44–121)
BUN/Creatinine Ratio: 17 (ref 12–28)
BUN: 30 mg/dL — ABNORMAL HIGH (ref 8–27)
Bilirubin Total: 0.4 mg/dL (ref 0.0–1.2)
CO2: 25 mmol/L (ref 20–29)
Calcium: 9.7 mg/dL (ref 8.7–10.3)
Chloride: 101 mmol/L (ref 96–106)
Creatinine, Ser: 1.74 mg/dL — ABNORMAL HIGH (ref 0.57–1.00)
Globulin, Total: 2.8 g/dL (ref 1.5–4.5)
Glucose: 176 mg/dL — ABNORMAL HIGH (ref 70–99)
Potassium: 5.5 mmol/L — ABNORMAL HIGH (ref 3.5–5.2)
Sodium: 139 mmol/L (ref 134–144)
Total Protein: 6.8 g/dL (ref 6.0–8.5)
eGFR: 30 mL/min/{1.73_m2} — ABNORMAL LOW (ref >59)

## 2022-06-10 ENCOUNTER — Telehealth: Payer: Self-pay

## 2022-06-10 NOTE — Progress Notes (Signed)
[employer] PREP Weekly Session  Patient Details  Name: MARYKATHLEEN RUSSI MRN: [id number] Date of Birth: [birth year] Age: 77 y.o. PCP: Dorothyann Peng, MD  Vitals:   06/10/22 1616  Weight: 235 lb 12.8 oz (107 kg)     [employer] Weekly seesion - 06/10/22 1600       [facility] "PREP" Location   [facility] "PREP" Location [facility]      Weekly Session   Topic Discussed Stress management and problem solving   relaxation meditation, breathwork   Minutes exercised this week 180 minutes    Classes attended to date 5             Bonnye Fava 06/10/2022, 4:17 PM

## 2022-06-10 NOTE — Progress Notes (Signed)
Faxed new application to [employer] patient assistance for Farxiga.   Tamala Melvin, CMA Clinical Pharmacist Assistant [phone]  

## 2022-06-11 ENCOUNTER — Telehealth: Payer: Self-pay

## 2022-06-11 NOTE — Progress Notes (Signed)
Care Management & Coordination Services Pharmacy Team  Reason for Encounter: Appointment Reminder  Contacted patient to confirm telephone appointment with [name], PharmD on 06-13-2022 at 1:30. Spoke with patient on 06/11/2022   Do you have any problems getting your medications? No  What is your top health concern you would like to discuss at your upcoming visit? Patient stated no concerns  Have you seen any other providers since your last visit with PCP? No   Chart review: Recent office visits:  None  Recent consult visits:  06-10-2022 Filiberto Pinks, RN [facility]). Prep class.  Hospital visits:  None in previous 6 months   Star Rating Drugs: Ozempic 1 mg- PAP Olmesartan 40 mg- Last [facility]-2022 [facility] Previous 09-16-2021 [facility]. Atorvastatin 40 mg- Last filled 03-21-2022 [facility] Previous 12-21-2021 90 DS.  Care Gaps: Annual wellness visit in last year? Yes  If Diabetic: Last eye exam / retinopathy screening:Patient stated 05-2021  Last diabetic foot exam:Patient stated 2023      Huey Romans [facility] Clinical Pharmacist Assistant [phone]

## 2022-06-13 ENCOUNTER — Ambulatory Visit: Payer: Medicare Other

## 2022-06-13 ENCOUNTER — Telehealth: Payer: Self-pay

## 2022-06-13 NOTE — Progress Notes (Unsigned)
Care Management & Coordination Services Pharmacy Note  06/13/2022 Name:  Teresa Stanley MRN:  [id number] DOB:  Jul 13, 1945  Summary: Teresa Stanley reports that she has been doing well. Of note her labs came back and her potassium is more elevated. PCP and I collaborated to determine next steps for patient.   Recommendations/Changes made from today's visit: Patient is going to bring her kidney diet list with her when she is out and about.   Will collaborate with patients family to review high potassium foods and their ingredients.  Over the next couple of weeks Teresa Stanley is going to stop eating dairy yogurt and start having coconut yogurt in the morning.  Requested Teresa Stanley follow up with specialist to let them know of her potassium results, she left a message and is awaiting  reply.   Follow up plan: Patient to focus on a low potassium diet.  Patient to continue to limit high potassium foods for at least the next two weeks by replacing her dairy yogurt with coconut milk yogurt.    Subjective: Teresa Stanley is an 77 y.o. year old female who is a primary patient of Teresa Peng, MD.  The care coordination team was consulted for assistance with disease management and care coordination needs.    Engaged with patient by telephone for follow up visit.  Recent office visits:  None   Recent consult visits:  06-10-2022 Teresa Pinks, RN [facility]). Prep class.   Hospital visits:  None in previous 6 months  Hospital visits: None in previous 6 months   Objective:  Lab Results  Component Value Date   CREATININE 1.74 (H) 06/04/2022   BUN 30 (H) 06/04/2022   GFR 39.42 (L) 12/25/2017   EGFR 30 (L) 06/04/2022   GFRNONAA 38 (L) 03/22/2020   GFRAA 43 (L) 03/22/2020   NA 139 06/04/2022   K 5.5 (H) 06/04/2022   CALCIUM 9.7 06/04/2022   CO2 25 06/04/2022   GLUCOSE 176 (H) 06/04/2022    Lab Results  Component Value Date/Time   HGBA1C 6.6 (H) 04/03/2022 12:09 PM   HGBA1C 6.0 (H)  12/31/2021 03:51 PM   GFR 39.42 (L) 12/25/2017 03:08 PM   MICROALBUR 150 06/18/2020 12:27 PM   MICROALBUR 80 06/16/2019 02:39 PM    Last diabetic Eye exam:  Lab Results  Component Value Date/Time   HMDIABEYEEXA No Retinopathy 05/08/2022 07:49 AM    Last diabetic Foot exam: No results found for: "HMDIABFOOTEX"   Lab Results  Component Value Date   CHOL 168 04/03/2022   HDL 70 04/03/2022   LDLCALC 80 04/03/2022   TRIG 99 04/03/2022   CHOLHDL 2.4 04/03/2022       Latest Ref Rng & Units 06/04/2022   11:18 AM 07/10/2021   12:00 AM 04/03/2021   10:51 AM  Hepatic Function  Total Protein 6.0 - 8.5 g/dL 6.8  6.7  7.1   Albumin 3.8 - 4.8 g/dL 4.0  3.8  4.2   AST 0 - 40 IU/L 15  16  17    ALT 0 - 32 IU/L 11  11  12    Alk Phosphatase 44 - 121 IU/L 129  116  138   Total Bilirubin 0.0 - 1.2 mg/dL 0.4  0.3  0.5     Lab Results  Component Value Date/Time   TSH 2.550 12/31/2021 03:51 PM   TSH 2.930 03/22/2020 11:12 AM   FREET4 1.73 03/22/2020 11:12 AM   FREET4 1.33 08/23/2009 07:54 PM  Latest Ref Rng & Units 07/10/2021   12:00 AM 06/18/2020   12:10 PM 03/22/2020   11:12 AM  CBC  WBC 3.4 - 10.8 x10E3/uL 7.2  8.4  7.5   Hemoglobin 11.1 - 15.9 g/dL 40.9  81.1  91.4   Hematocrit 34.0 - 46.6 % 35.6  40.5  41.2   Platelets 150 - 450 x10E3/uL 270  388  305     Lab Results  Component Value Date/Time   VITAMINB12 1,167 04/03/2021 10:51 AM    Clinical ASCVD: No  The 10-year ASCVD risk score (Arnett DK, et al., 2019) is: 34.1%   Values used to calculate the score:     Age: 77 years     Sex: Female     Is Non-Hispanic African American: Yes     Diabetic: Yes     Tobacco smoker: No     Systolic Blood Pressure: 138 mmHg     Is BP treated: Yes     HDL Cholesterol: 70 mg/dL     Total Cholesterol: 168 mg/dL        7/82/9562   13:08 AM 09/26/2021    2:59 PM 03/21/2021   11:42 AM  Depression screen PHQ 2/9  Decreased Interest 0 0 0  Down, Depressed, Hopeless 0 0 0  PHQ - 2  Score 0 0 0     Social History   Tobacco Use  Smoking Status Never   Passive exposure: Never  Smokeless Tobacco Never   BP Readings from Last 3 Encounters:  04/23/22 138/84  04/03/22 124/80  04/03/22 124/80   Pulse Readings from Last 3 Encounters:  04/23/22 77  04/03/22 87  04/03/22 80   Wt Readings from Last 3 Encounters:  06/10/22 235 lb 12.8 oz (107 kg)  05/20/22 238 lb 9.6 oz (108.2 kg)  04/23/22 246 lb 3.2 oz (111.7 kg)   BMI Readings from Last 3 Encounters:  06/10/22 44.55 kg/m  05/20/22 45.08 kg/m  04/23/22 46.52 kg/m    No Known Allergies  Medications Reviewed Today     Reviewed by Teresa Peng, MD (Physician) on 05/14/22 at 2056  Med List Status: <None>   Medication Order Taking? Sig Documenting Provider Last Dose Status Informant  amLODipine (NORVASC) 10 MG tablet [id number] No Take 1 tablet (10 mg total) by mouth daily. Teresa Peng, MD Taking Active   aspirin 81 MG tablet [id number] No Take 81 mg by mouth daily. [provider] Taking Active   atorvastatin (LIPITOR) 40 MG tablet [id number] No Take 1 tablet (40 mg total) by mouth daily. Teresa Peng, MD Taking Active   BD PEN NEEDLE NANO 2ND GEN 32G X 4 MM MISC [id number] No USE WITH Teresa Eth, MD Taking Active   Blood Glucose Calibration (OT ULTRA/FASTTK CNTRL SOLN) SOLN [id number] No  [provider] Taking Active   Blood Glucose Monitoring Suppl (ONE TOUCH ULTRA 2) w/Device KIT [id number] No Use as directed to check blood sugars 2 times per day dx:e11.22 Teresa Peng, MD Taking Active   carboxymethylcellulose (REFRESH PLUS) 0.5 % SOLN [id number] No Place 1 drop into both eyes daily. [provider] Taking Active Self  chlorthalidone (HYGROTON) 25 MG tablet [id number] No Take 25 mg by mouth daily. [provider] Taking Active   cholecalciferol (VITAMIN D3) 25 MCG (1000 UNIT) tablet [id number] No Take 1,000 Units by mouth daily. [provider]  Taking Active   famotidine (HM FAMOTIDINE) 10 MG tablet [id number] No Take 1 tablet (10 mg total) by  mouth daily. Teresa Peng, MD Taking Active   gabapentin (NEURONTIN) 300 MG capsule [id number] No TAKE 1 CAPSULE BY MOUTH EVERYDAY AT BEDTIME Teresa Peng, MD Taking Active   hydrALAZINE (APRESOLINE) 25 MG tablet [id number] No TAKE 1 TABLET BY MOUTH TWICE A Sharyon Medicus, MD Taking Active   insulin degludec (TRESIBA FLEXTOUCH) 100 UNIT/ML SOPN FlexTouch Pen [id number] No Inject 16 Units into the skin at bedtime.  [provider] Taking Active   meclizine (ANTIVERT) 25 MG tablet [id number] No TAKE 1 TABLET (25 MG TOTAL) BY MOUTH 3 (THREE) TIMES DAILY AS NEEDED FOR DIZZINESS. Teresa Peng, MD Taking Active   methimazole (TAPAZOLE) 5 MG tablet [id number]  TAKE 1 TABLET (5 MG TOTAL) BY MOUTH DAILY. Teresa Peng, MD  Active   olmesartan [facility]) 40 MG tablet [id number] No Take 1 tablet (40 mg total) by mouth daily. Teresa Peng, MD Taking Expired 12/31/21 2359   OneTouch Delica Lancets 33G MISC [id number] No Use as directed to check blood sugars 2 times per day dx: e11.22 Teresa Peng, MD Taking Active   [facility] ULTRA test strip [id number] No USE AS INSTRUCTED TO CHECK BLOOD SUGARS 2 TIMES PER DAY DX: E11.22 Teresa Peng, MD Taking Active   propranolol ER (INDERAL LA) 120 MG 24 hr capsule [id number] No TAKE 1 CAPSULE BY MOUTH EVERY DAY Teresa Peng, MD Taking Active   Semaglutide, 1 MG/DOSE, 4 MG/3ML SOPN [id number] No Inject 1 mg into the skin once a week. Teresa Peng, MD Taking Active   sodium bicarbonate 650 MG tablet [id number] No TAKE 1 TABLET BY MOUTH EVERY DAY Teresa Peng, MD Taking Active   triamcinolone cream (KENALOG) 0.1 % [id number] No Apply 1 application topically 2 (two) times daily. Apply Teresa Peng, MD Taking Active             SDOH:  (Social Determinants of Health) assessments and interventions performed: No SDOH Interventions    Flowsheet Row Clinical  Support from 04/03/2022 in [facility] Chronic Care Management from 10/12/2019 in [facility] Chronic Care Management from 09/08/2019 in [facility] Chronic Care Management from 08/04/2019 in [facility] Clinical Support from 03/17/2019 in [facility] Clinical Support from 05/27/2018 in [facility]  SDOH Interventions        Food Insecurity Interventions Intervention Not Indicated -- -- -- -- --  Transportation Interventions Intervention Not Indicated -- -- -- -- --  Depression Interventions/Treatment  -- -- -- -- PHQ2-9 Score <4 Follow-up Not Indicated PHQ2-9 Score <4 Follow-up Not Indicated  Financial Strain Interventions Intervention Not Indicated Other (Comment)  [Will continue to assist with obtaining Ozempic and [place] through [employer] patient assistance program] Other (Comment)  [Placed refill requests for [place] and [employer] with [employer] patient assistance program] Other (Comment)  [Will continue to assist with obtaining medications via patient assistance program] -- --  Physical Activity Interventions Intervention Not Indicated -- -- -- -- --  Stress Interventions Intervention Not Indicated -- -- -- -- --       Medication Assistance:  Farxiga obtained through [employer] medication assistance program.  Enrollment ends 01/2023  Medication Access: Within the past 30 days, how often has patient missed a dose of medication? Yes  Is a pillbox or other method used to improve adherence? Yes  Factors that may affect medication adherence? financial need, language, and lack of understanding  of disease management Are meds synced by current pharmacy? No  Are meds delivered by current pharmacy? No  Does patient experience delays in picking up medications due to transportation concerns? No    [facility] Services Reviewed: Is patient disadvantaged to use [facility]?: No  Current Rx insurance plan: [facility] Medicare  Name and location of Current pharmacy:  [facility] [phone] [place], [place] - [address] [place] [address] Phone: [phone] Fax: [phone]  [facility] services reviewed with patient today?: No    Compliance/Adherence/Medication fill history: Recent office visits:  None   Recent consult visits:  06-10-2022 Teresa Pinks, RN [facility]). Prep class.   Hospital visits:  None in previous 6 months  Star-Rating Drugs: Ozempic 1 mg- PAP Olmesartan 40 mg- Last filled 03-27-2022 [facility] Previous 09-16-2021 90 DS. Atorvastatin 40 mg- Last filled 03-21-2022 [facility] Previous 12-21-2021 90 DS.     Assessment/Plan   Chronic Kidney Disease Stage 3b  -All medications assessed for renal dosing and appropriateness in chronic kidney disease. -Patients current potassium elevated at 5.5. -Current medications: Farxiga 10 mg tablet once per day Appropriate, Effective, Safe, Accessible Atorvastatin 40 mg tablet once per day Appropriate, Effective, Safe, Accessible Olmesartan 40 mg tablet once per day Appropriate, Effective, Safe, Accessible  -Discussed in great detail what a renal diet is and the importance of limiting potassium rich foods.  -As we reviewed the list of items together she noted that she is eating yogurt everyday and bran cereal.  -She reports that she is going to try  -I asked Ms. Leitz to stop eating all dairy  -Recommended to continue current medication Collaborated with PCP and specialist team to review next steps for patients potassium levels  [name], CPP, PharmD Clinical Pharmacist Practitioner [facility] [phone]

## 2022-06-13 NOTE — Telephone Encounter (Signed)
  Called [facility] for patient due to patients elevated potassium levels drawn in office this week. Spoke with Misty Stanley who informed that I will have to leave a voicemail, and inquired about what medication needed to be refilled. I explained I was calling on behalf of the PCP team to confirm that they received the labs for Teresa Stanley that were sent. Per [employer] the assistant she reports that at this point they did not see any labs, and the provider is out until next week.Wednesday. She reports she is going to check and see if there are any labs that they can see and next steps for the patient.  Per CMA she reports that when labs are sent please make sure to call to confirm that labs are in the chart. CMA encouraged me to enforce a low sodium. She is scheduled to be seen on July 10th, but provider will review and patient will probably need to come in next week.  [name] graciously pulled the labs from [employer]. The provider will review her labs and call her next week.   [name], CPP, PharmD Clinical Pharmacist Practitioner [facility] [phone]

## 2022-06-18 ENCOUNTER — Other Ambulatory Visit: Payer: Self-pay | Admitting: Internal Medicine

## 2022-06-18 DIAGNOSIS — Z1231 Encounter for screening mammogram for malignant neoplasm of breast: Secondary | ICD-10-CM

## 2022-06-19 ENCOUNTER — Other Ambulatory Visit: Payer: Self-pay | Admitting: Internal Medicine

## 2022-06-19 NOTE — Progress Notes (Signed)
[facility] PREP Weekly Session  Patient Details  Name: GLADINE JOLLIFFE MRN: [id number] Date of Birth: May 25, 1945 Age: 77 y.o. PCP: Dorothyann Peng, MD  Vitals:   06/17/22 1430  Weight: 238 lb (108 kg)     [facility] Weekly seesion - 06/19/22 1200       [facility] "PREP" Location   [facility]" [facility]      Weekly Session   Topic Discussed Expectations and non-scale victories    Minutes exercised this week 110 minutes    Classes attended to date 7             Pam Jerral Bonito 06/19/2022, 12:17 PM

## 2022-06-23 ENCOUNTER — Encounter: Payer: Self-pay | Admitting: Internal Medicine

## 2022-07-01 ENCOUNTER — Encounter: Payer: Medicare Other | Admitting: Internal Medicine

## 2022-07-01 ENCOUNTER — Telehealth: Payer: Self-pay

## 2022-07-01 NOTE — Telephone Encounter (Signed)
Patient has been approved for Farxiga 10 mg tablet once per day patient assistance program.  [name], CPP, PharmD Clinical Pharmacist Practitioner [facility] [phone]

## 2022-07-03 NOTE — Progress Notes (Signed)
[facility] PREP Weekly Session  Patient Details  Name: Teresa Stanley MRN: [id number] Date of Birth: 16-Aug-1945 Age: 77 y.o. PCP: Dorothyann Peng, MD  Vitals:   07/01/22 1430  Weight: 237 lb (107.5 kg)     [facility] Weekly seesion - 07/03/22 1500       [facility] "PREP" Location   [facility] "PREP" Location [facility]      Weekly Session   Topic Discussed Finding support    Minutes exercised this week 180 minutes    Classes attended to date 9             Bonnye Fava 07/03/2022, 3:51 PM

## 2022-07-08 NOTE — Progress Notes (Signed)
[facility] PREP Weekly Session  Patient Details  Name: Teresa Stanley MRN: [id number] Date of Birth: [birth year] Age: 77 y.o. PCP: Dorothyann Peng, MD  Vitals:   07/08/22 1430  Weight: 237 lb (107.5 kg)     [facility] Weekly seesion - 07/08/22 1700       [facility] "PREP" Location   [facility]      Weekly Session   Topic Discussed Calorie breakdown    Minutes exercised this week 150 minutes    Classes attended to date 10             Bonnye Fava 07/08/2022, 5:05 PM

## 2022-07-16 ENCOUNTER — Telehealth: Payer: Self-pay

## 2022-07-16 NOTE — Progress Notes (Signed)
[facility] PREP Weekly Session  Patient Details  Name: Teresa Stanley MRN: [id number] Date of Birth: 1945-04-01 Age: 77 y.o. PCP: Dorothyann Peng, MD  Vitals:   07/15/22 1430  Weight: 239 lb (108.4 kg)     [facility] Weekly seesion - 07/16/22 0900       [facility] "PREP" Location   [facility]      Weekly Session   Topic Discussed Hitting roadblocks    Minutes exercised this week 130 minutes    Classes attended to date 12             Bonnye Fava 07/16/2022, 9:56 AM

## 2022-07-17 NOTE — Progress Notes (Signed)
Patient aware Teresa Stanley, Ozempic and Novofine needles have arrived to the office, [employer] patient assistance.    Billee Cashing, CMA Clinical Pharmacist Assistant [phone]

## 2022-07-18 ENCOUNTER — Other Ambulatory Visit: Payer: Self-pay | Admitting: Internal Medicine

## 2022-07-21 ENCOUNTER — Ambulatory Visit (INDEPENDENT_AMBULATORY_CARE_PROVIDER_SITE_OTHER): Payer: Medicare Other | Admitting: Internal Medicine

## 2022-07-21 ENCOUNTER — Encounter: Payer: Self-pay | Admitting: Internal Medicine

## 2022-07-21 VITALS — BP 120/70 | HR 85 | Temp 98.5°F | Ht 61.0 in | Wt 238.2 lb

## 2022-07-21 DIAGNOSIS — Z794 Long term (current) use of insulin: Secondary | ICD-10-CM | POA: Diagnosis not present

## 2022-07-21 DIAGNOSIS — M25512 Pain in left shoulder: Secondary | ICD-10-CM

## 2022-07-21 DIAGNOSIS — E782 Mixed hyperlipidemia: Secondary | ICD-10-CM | POA: Diagnosis not present

## 2022-07-21 DIAGNOSIS — R0609 Other forms of dyspnea: Secondary | ICD-10-CM | POA: Diagnosis not present

## 2022-07-21 DIAGNOSIS — R9431 Abnormal electrocardiogram [ECG] [EKG]: Secondary | ICD-10-CM | POA: Diagnosis not present

## 2022-07-21 DIAGNOSIS — N1832 Chronic kidney disease, stage 3b: Secondary | ICD-10-CM | POA: Diagnosis not present

## 2022-07-21 DIAGNOSIS — G8929 Other chronic pain: Secondary | ICD-10-CM

## 2022-07-21 DIAGNOSIS — Z0001 Encounter for general adult medical examination with abnormal findings: Secondary | ICD-10-CM

## 2022-07-21 DIAGNOSIS — Z0000 Encounter for general adult medical examination without abnormal findings: Secondary | ICD-10-CM

## 2022-07-21 DIAGNOSIS — I129 Hypertensive chronic kidney disease with stage 1 through stage 4 chronic kidney disease, or unspecified chronic kidney disease: Secondary | ICD-10-CM | POA: Diagnosis not present

## 2022-07-21 DIAGNOSIS — E1122 Type 2 diabetes mellitus with diabetic chronic kidney disease: Secondary | ICD-10-CM | POA: Diagnosis not present

## 2022-07-21 DIAGNOSIS — E2839 Other primary ovarian failure: Secondary | ICD-10-CM

## 2022-07-21 DIAGNOSIS — Z6841 Body Mass Index (BMI) 40.0 and over, adult: Secondary | ICD-10-CM

## 2022-07-21 NOTE — Progress Notes (Signed)
Pura Spice as a Neurosurgeon for [name], MD.,have documented all relevant documentation on the behalf of [name], MD,as directed by  [name], MD while in the presence of [name], MD.  Subjective:    Patient ID: Teresa Stanley , female    DOB: 1945-11-17 , 77 y.o.   MRN: [id number]  Chief Complaint  Patient presents with   Annual Exam   Diabetes   Hypertension    HPI  She presents today for HM. She reports compliance with meds. She has not had any issues with meds. She denies headaches , chest pain and shortness of breath. She states she has been trying to be more active in her home.   BP Readings from Last 3 Encounters: 07/21/22 : (!) 160/90 04/23/22 : 138/84 04/03/22 : 124/80    Diabetes She presents for her follow-up diabetic visit. She has type 2 diabetes mellitus. Her disease course has been stable. There are no hypoglycemic associated symptoms. Pertinent negatives for diabetes include no blurred vision and no chest pain. There are no hypoglycemic complications. Diabetic complications include nephropathy. Risk factors for coronary artery disease include diabetes mellitus, dyslipidemia, obesity, post-menopausal and sedentary lifestyle. She is compliant with treatment some of the time. Her weight is decreasing steadily. She is following a generally healthy diet. She participates in exercise intermittently. Her breakfast blood glucose is taken between 8-9 am. Her breakfast blood glucose range is generally 130-140 mg/dl. An ACE inhibitor/angiotensin II receptor blocker is being taken. Eye exam is current.  Hypertension The current episode started more than 1 year ago. The problem has been gradually improving since onset. The problem is uncontrolled. Associated symptoms include shortness of breath. Pertinent negatives include no blurred vision, chest pain or palpitations. The current treatment provides moderate improvement. Compliance problems include  exercise.  Hypertensive end-organ damage includes kidney disease.     Past Medical History:  Diagnosis Date   Arthritis    Diabetes mellitus    takes Actos and Metformin daily and Victoza   GERD (gastroesophageal reflux disease)    takes Omeprazole daily   Headache(784.0)    occasionally   Hyperlipidemia    takes Atorvastatin daily   Hypertension    takes Lisinopril,Amlodipine,and Metoprolol daily   Hypothyroidism    Joint pain    Thyroid disease    ?     Family History  Problem Relation Age of Onset   Diabetes Mother    Hypertension Mother    Diabetes Father    Asthma Father    Hypertension Father    Breast cancer Sister 26   Hypertension Sister        2   Diabetes Sister    Hypertension Daughter    Hypertension Brother        5   Kidney disease Brother    Colon cancer Neg Hx    Esophageal cancer Neg Hx    Rectal cancer Neg Hx    Stomach cancer Neg Hx      Current Outpatient Medications:    amLODipine (NORVASC) 10 MG tablet, Take 1 tablet (10 mg total) by mouth daily., Disp: 90 tablet, Rfl: 2   aspirin 81 MG tablet, Take 81 mg by mouth daily., Disp: , Rfl:    atorvastatin (LIPITOR) 40 MG tablet, TAKE 1 TABLET BY MOUTH EVERY DAY, Disp: 90 tablet, Rfl: 2   BD PEN NEEDLE NANO 2ND GEN 32G X 4 MM MISC, USE WITH TRESIBA, Disp: 100 each,  Rfl: 10   Blood Glucose Calibration (OT ULTRA/FASTTK CNTRL SOLN) SOLN, , Disp: , Rfl:    Blood Glucose Monitoring Suppl (ONE TOUCH ULTRA 2) w/Device KIT, Use as directed to check blood sugars 2 times per day dx:e11.22, Disp: 1 each, Rfl: 1   carboxymethylcellulose (REFRESH PLUS) 0.5 % SOLN, Place 1 drop into both eyes daily., Disp: , Rfl:    chlorthalidone (HYGROTON) 25 MG tablet, Take 25 mg by mouth daily., Disp: , Rfl:    cholecalciferol (VITAMIN D3) 25 MCG (1000 UNIT) tablet, Take 1,000 Units by mouth daily., Disp: , Rfl:    famotidine (HM FAMOTIDINE) 10 MG tablet, Take 1 tablet (10 mg total) by mouth daily., Disp: 90 tablet, Rfl:  1   gabapentin (NEURONTIN) 300 MG capsule, TAKE 1 CAPSULE BY MOUTH EVERYDAY AT BEDTIME, Disp: 90 capsule, Rfl: 2   hydrALAZINE (APRESOLINE) 25 MG tablet, TAKE 1 TABLET BY MOUTH TWICE A DAY, Disp: 60 tablet, Rfl: 1   insulin degludec (TRESIBA FLEXTOUCH) 100 UNIT/ML SOPN FlexTouch Pen, Inject 16 Units into the skin at bedtime. , Disp: , Rfl:    meclizine (ANTIVERT) 25 MG tablet, TAKE 1 TABLET (25 MG TOTAL) BY MOUTH 3 (THREE) TIMES DAILY AS NEEDED FOR DIZZINESS., Disp: 30 tablet, Rfl: 0   methimazole (TAPAZOLE) 5 MG tablet, TAKE 1 TABLET (5 MG TOTAL) BY MOUTH DAILY., Disp: 90 tablet, Rfl: 1   olmesartan (BENICAR) 40 MG tablet, Take 1 tablet (40 mg total) by mouth daily., Disp: 90 tablet, Rfl: 2   OneTouch Delica Lancets 33G MISC, Use as directed to check blood sugars 2 times per day dx: e11.22, Disp: 300 each, Rfl: 2   ONETOUCH ULTRA test strip, USE AS INSTRUCTED TO CHECK BLOOD SUGARS 2 TIMES PER DAY DX: E11.22, Disp: 200 strip, Rfl: 4   propranolol ER (INDERAL LA) 120 MG 24 hr capsule, TAKE 1 CAPSULE BY MOUTH EVERY DAY, Disp: 90 capsule, Rfl: 2   Semaglutide, 1 MG/DOSE, 4 MG/3ML SOPN, Inject 1 mg into the skin once a week., Disp: 3 mL, Rfl: 2   sodium bicarbonate 650 MG tablet, TAKE 1 TABLET BY MOUTH EVERY DAY, Disp: 90 tablet, Rfl: 3   triamcinolone cream (KENALOG) 0.1 %, Apply 1 application topically 2 (two) times daily. Apply, Disp: 45 g, Rfl: 1   No Known Allergies    The patient states she uses status post hysterectomy for birth control. No LMP recorded. Patient has had a hysterectomy.. Negative for Dysmenorrhea. Negative for: breast discharge, breast lump(s), breast pain and breast self exam. Associated symptoms include abnormal vaginal bleeding. Pertinent negatives include abnormal bleeding (hematology), anxiety, decreased libido, depression, difficulty falling sleep, dyspareunia, history of infertility, nocturia, sexual dysfunction, sleep disturbances, urinary incontinence, urinary urgency,  vaginal discharge and vaginal itching. Diet regular.The patient states her exercise level is  intermittent. She has joined the [employer].  . The patient's tobacco use is:  Social History   Tobacco Use  Smoking Status Never   Passive exposure: Never  Smokeless Tobacco Never  . She has been exposed to passive smoke. The patient's alcohol use is:  Social History   Substance and Sexual Activity  Alcohol Use No   Review of Systems  Constitutional: Negative.   HENT: Negative.    Eyes: Negative.  Negative for blurred vision.  Respiratory:  Positive for shortness of breath.   Cardiovascular: Negative.  Negative for chest pain and palpitations.  Gastrointestinal: Negative.   Endocrine: Negative.   Genitourinary: Negative.   Musculoskeletal:  Positive  for arthralgias.       She c/o persistent left shoulder pain. Denies fall/trauma. She has pain with lifting her arm above her head. There is pain with movement.   Skin: Negative.   Allergic/Immunologic: Negative.   Neurological: Negative.   Hematological: Negative.   Psychiatric/Behavioral: Negative.       Today's Vitals   07/21/22 1417 07/21/22 1430  BP: (!) 160/90 120/70  Pulse: 85   Temp: 98.5 F (36.9 C)   TempSrc: Oral   Weight: 238 lb 3.2 oz (108 kg)   Height: 5\' 1"  (1.549 m)   PainSc: 0-No pain    Body mass index is 45.01 kg/m.  Wt Readings from Last 3 Encounters:  07/21/22 238 lb 3.2 oz (108 kg)  07/15/22 239 lb (108.4 kg)  07/08/22 237 lb (107.5 kg)     Objective:  Physical Exam Vitals and nursing note reviewed.  Constitutional:      Appearance: Normal appearance. She is obese.  HENT:     Head: Normocephalic and atraumatic.     Right Ear: Tympanic membrane, ear canal and external ear normal.     Left Ear: Tympanic membrane, ear canal and external ear normal.     Nose:     Comments: Masked      Mouth/Throat:     Comments: Masked  Eyes:     Extraocular Movements: Extraocular movements intact.      Conjunctiva/sclera: Conjunctivae normal.     Pupils: Pupils are equal, round, and reactive to light.  Cardiovascular:     Rate and Rhythm: Normal rate and regular rhythm.     Pulses:          Dorsalis pedis pulses are 1+ on the right side and 1+ on the left side.     Heart sounds: Normal heart sounds.  Pulmonary:     Effort: Pulmonary effort is normal.     Breath sounds: Normal breath sounds.  Chest:  Breasts:    Tanner Score is 5.     Right: Normal.     Left: Normal.     Comments: Pendulous Abdominal:     General: Bowel sounds are normal.     Palpations: Abdomen is soft.     Comments: Obese, soft. Difficult to assess organomegaly due to body habitus.  Genitourinary:    Comments: deferred Musculoskeletal:        General: Tenderness present.     Cervical back: Normal range of motion and neck supple.     Right lower leg: No edema.     Comments: Pain with movement of left shoulder, POS tenderness to deep palpation  Feet:     Right foot:     Protective Sensation: 5 sites tested.  5 sites sensed.     Skin integrity: Dry skin present.     Toenail Condition: Right toenails are long.     Left foot:     Protective Sensation: 5 sites tested.  5 sites sensed.     Skin integrity: Dry skin present.     Toenail Condition: Left toenails are long.  Skin:    General: Skin is warm and dry.  Neurological:     General: No focal deficit present.     Mental Status: She is alert and oriented to person, place, and time.  Psychiatric:        Mood and Affect: Mood normal.        Behavior: Behavior normal.         Assessment And  Plan:     1. Encounter for general adult medical examination w/o abnormal findings Comments: A full exam was performed.  She reports compliance with monthly self breast exams.  She is scheduled for mammogram later this month.PATIENT IS ADVISED TO GET 30-45 MINUTES REGULAR EXERCISE NO LESS THAN FOUR TO FIVE DAYS PER WEEK - BOTH WEIGHTBEARING EXERCISES AND AEROBIC ARE  RECOMMENDED.  PATIENT IS ADVISED TO FOLLOW A HEALTHY DIET WITH AT LEAST SIX FRUITS/VEGGIES PER DAY, DECREASE INTAKE OF RED MEAT, AND TO INCREASE FISH INTAKE TO TWO DAYS PER WEEK.  MEATS/FISH SHOULD NOT BE FRIED, BAKED OR BROILED IS PREFERABLE.  IT IS ALSO IMPORTANT TO CUT BACK ON YOUR SUGAR INTAKE. PLEASE AVOID ANYTHING WITH ADDED SUGAR, CORN SYRUP OR OTHER SWEETENERS. IF YOU MUST USE A SWEETENER, YOU CAN TRY STEVIA. IT IS ALSO IMPORTANT TO AVOID ARTIFICIALLY SWEETENERS AND DIET BEVERAGES. LASTLY, I SUGGEST WEARING SPF 50 SUNSCREEN ON EXPOSED PARTS AND ESPECIALLY WHEN IN THE DIRECT SUNLIGHT FOR AN EXTENDED PERIOD OF TIME.  PLEASE AVOID FAST FOOD RESTAURANTS AND INCREASE YOUR WATER INTAKE.  2. Type 2 diabetes mellitus with stage 3b chronic kidney disease, with long-term current use of insulin (HCC) Comments: Chronic, diabetic foot exam was performed. She will c/w weekly semaglutide 1mg  and Tresiba 16 units nightly.[name] She will f/u in 3-4 months for re-evaluation.  I DISCUSSED WITH THE PATIENT AT LENGTH REGARDING THE GOALS OF GLYCEMIC CONTROL AND POSSIBLE LONG-TERM COMPLICATIONS.  I  ALSO STRESSED THE IMPORTANCE OF COMPLIANCE WITH HOME GLUCOSE MONITORING, DIETARY RESTRICTIONS INCLUDING AVOIDANCE OF SUGARY DRINKS/PROCESSED FOODS,  ALONG WITH REGULAR EXERCISE.  I  ALSO STRESSED THE IMPORTANCE OF ANNUAL EYE EXAMS, SELF FOOT CARE AND COMPLIANCE WITH OFFICE VISITS. - POCT URINALYSIS DIP (CLINITEK) - Microalbumin / creatinine urine ratio - CBC with Differential/Platelet - Hemoglobin A1c - PTH, intact and calcium - Phosphorus - Protein electrophoresis, serum - BMP8+eGFR  3. Parenchymal renal hypertension, stage 1 through stage 4 or unspecified chronic kidney disease Comments: Chronic, well controlled. EKG performed, NSR w/ frequent PVCs, ventricular bigeminy and LAFB.  She will c/w olmesartan, hydralazine and propranolol daily.  She is encouraged to follow a low sodium diet. She will follow up in 4 months for  re-evaluation.  - POCT URINALYSIS DIP (CLINITEK) - EKG 12-Lead - Microalbumin / creatinine urine ratio - CBC with Differential/Platelet  4. Estrogen deficiency Comments: She agrees to getting bone density.  She is encouraged to engage in weight-bearing exercises at least three days per week.  - DG Bone Density; Future  5. Abnormal EKG Comments: EKG revealed ventricular bigeminy. She agrees to Cardiology evaluation. I will check potassium level today as well. - Ambulatory referral to Cardiology - BMP8+eGFR  6. Dyspnea on exertion Comments: Possibly due to deconditioning; however, due to her risk factors I will refer her to Cardiology for further evaluation. She agrees to Cardiology referral. - PCV ECHOCARDIOGRAM COMPLETE; Future - Ambulatory referral to Cardiology  7. Mixed hyperlipidemia Comments: Chronic, she will c/w atorvastatin 40mg  daily. She is encouraged to follow a heart healthy lifestyle.  8. Chronic left shoulder pain Comments: She agrees to Ortho referral for further evaluation and radiographic studies. - Ambulatory referral to Orthopedic Surgery  9. Class 3 severe obesity due to excess calories with serious comorbidity and body mass index (BMI) of 45.0 to 49.9 in adult [facility]) Comments: BMI 45. She is encouraged to aim for at least 150 minutes of exercise/week, while initially striving for BMI<40 to decrease cardiac risk.    Return  for 1 year physical, 4 month DM. Patient was given opportunity to ask questions. Patient verbalized understanding of the plan and was able to repeat key elements of the plan. All questions were answered to their satisfaction.   I, [name], MD, have reviewed all documentation for this visit. The documentation on 07/21/22 for the exam, diagnosis, procedures, and orders are all accurate and complete.

## 2022-07-21 NOTE — Patient Instructions (Signed)

## 2022-07-22 ENCOUNTER — Telehealth: Payer: Self-pay

## 2022-07-22 LAB — POCT URINALYSIS DIP (CLINITEK)
Bilirubin, UA: NEGATIVE
Blood, UA: NEGATIVE
Glucose, UA: NEGATIVE mg/dL
Ketones, POC UA: NEGATIVE mg/dL
Nitrite, UA: NEGATIVE
POC PROTEIN,UA: 100 — AB
Spec Grav, UA: >=1.03 — AB (ref 1.010–1.025)
Urobilinogen, UA: 0.2 E.U./dL
pH, UA: 6 (ref 5.0–8.0)

## 2022-07-22 NOTE — Progress Notes (Cosign Needed Addendum)
[employer] reorder form for [employer] 100u/ml, Novofine needles and Ozempic 1 mg.   As of 07/16/2022, patient picked up 2 boxes of Tresiba, 4 boxes of Ozempic and 2 boxes of Novofine needles from PCP office, [employer] patient assistance.     Billee Cashing, CMA Clinical Pharmacist Assistant [phone]

## 2022-07-25 LAB — SPECIMEN STATUS REPORT

## 2022-07-25 LAB — PTH, INTACT AND CALCIUM

## 2022-07-25 LAB — CBC WITH DIFFERENTIAL/PLATELET
Basophils Absolute: 0 10*3/uL (ref 0.0–0.2)
Basos: 0 %
EOS (ABSOLUTE): 0.2 10*3/uL (ref 0.0–0.4)
Eos: 2 %
Hematocrit: 39.7 % (ref 34.0–46.6)
Hemoglobin: 12.8 g/dL (ref 11.1–15.9)
Immature Grans (Abs): 0 10*3/uL (ref 0.0–0.1)
Immature Granulocytes: 0 %
Lymphocytes Absolute: 1.5 10*3/uL (ref 0.7–3.1)
Lymphs: 20 %
MCH: 30.1 pg (ref 26.6–33.0)
MCHC: 32.2 g/dL (ref 31.5–35.7)
MCV: 93 fL (ref 79–97)
Monocytes Absolute: 0.5 10*3/uL (ref 0.1–0.9)
Monocytes: 7 %
Neutrophils Absolute: 5.1 10*3/uL (ref 1.4–7.0)
Neutrophils: 71 %
Platelets: 282 10*3/uL (ref 150–450)
RBC: 4.25 x10E6/uL (ref 3.77–5.28)
RDW: 13 % (ref 11.7–15.4)
WBC: 7.3 10*3/uL (ref 3.4–10.8)

## 2022-07-25 LAB — BMP8+EGFR
BUN/Creatinine Ratio: 18 (ref 12–28)
BUN: 29 mg/dL — ABNORMAL HIGH (ref 8–27)
CO2: 26 mmol/L (ref 20–29)
Calcium: 9.7 mg/dL (ref 8.7–10.3)
Chloride: 101 mmol/L (ref 96–106)
Creatinine, Ser: 1.59 mg/dL — ABNORMAL HIGH (ref 0.57–1.00)
Glucose: 86 mg/dL (ref 70–99)
Potassium: 5.3 mmol/L — ABNORMAL HIGH (ref 3.5–5.2)
Sodium: 140 mmol/L (ref 134–144)
eGFR: 34 mL/min/{1.73_m2} — ABNORMAL LOW (ref >59)

## 2022-07-25 LAB — MICROALBUMIN / CREATININE URINE RATIO
Creatinine, Urine: 77.7 mg/dL
Microalb/Creat Ratio: 447 mg/g creat — ABNORMAL HIGH (ref 0–29)
Microalbumin, Urine: 347.2 ug/mL

## 2022-07-25 LAB — PROTEIN ELECTROPHORESIS, SERUM
A/G Ratio: 1.1 (ref 0.7–1.7)
Albumin ELP: 3.7 g/dL (ref 2.9–4.4)
Alpha 1: 0.3 g/dL (ref 0.0–0.4)
Alpha 2: 0.9 g/dL (ref 0.4–1.0)
Beta: 1 g/dL (ref 0.7–1.3)
Gamma Globulin: 1.2 g/dL (ref 0.4–1.8)
Globulin, Total: 3.4 g/dL (ref 2.2–3.9)
Total Protein: 7.1 g/dL (ref 6.0–8.5)

## 2022-07-25 LAB — PHOSPHORUS: Phosphorus: 3.7 mg/dL (ref 3.0–4.3)

## 2022-07-25 LAB — HEMOGLOBIN A1C
Est. average glucose Bld gHb Est-mCnc: 140 mg/dL
Hgb A1c MFr Bld: 6.5 % — ABNORMAL HIGH (ref 4.8–5.6)

## 2022-07-27 ENCOUNTER — Encounter: Payer: Self-pay | Admitting: Internal Medicine

## 2022-07-29 ENCOUNTER — Other Ambulatory Visit: Payer: Self-pay

## 2022-07-29 ENCOUNTER — Encounter (HOSPITAL_COMMUNITY): Payer: Self-pay | Admitting: Emergency Medicine

## 2022-07-29 ENCOUNTER — Ambulatory Visit (HOSPITAL_COMMUNITY)
Admission: EM | Admit: 2022-07-29 | Discharge: 2022-07-29 | Disposition: A | Discharge status: Home / Self Care | Payer: Medicare Other | Attending: Physician Assistant | Admitting: Physician Assistant

## 2022-07-29 DIAGNOSIS — N1832 Chronic kidney disease, stage 3b: Secondary | ICD-10-CM | POA: Diagnosis not present

## 2022-07-29 DIAGNOSIS — M545 Low back pain, unspecified: Secondary | ICD-10-CM

## 2022-07-29 DIAGNOSIS — R8290 Unspecified abnormal findings in urine: Secondary | ICD-10-CM | POA: Insufficient documentation

## 2022-07-29 LAB — POCT URINALYSIS DIP (MANUAL ENTRY)
Bilirubin, UA: NEGATIVE
Blood, UA: NEGATIVE
Glucose, UA: NEGATIVE mg/dL
Ketones, POC UA: NEGATIVE mg/dL
Nitrite, UA: NEGATIVE
Protein Ur, POC: 30 mg/dL — AB
Spec Grav, UA: 1.015 (ref 1.010–1.025)
Urobilinogen, UA: 0.2 E.U./dL
pH, UA: 6 (ref 5.0–8.0)

## 2022-07-29 MED ORDER — PREDNISONE 20 MG PO TABS: 40.0000 mg | ORAL_TABLET | Freq: Every day | ORAL | 0 refills | Status: AC

## 2022-07-29 NOTE — ED Provider Notes (Signed)
[facility]    CSN: [id number] Arrival date & time: 07/29/22  1647      History   Chief Complaint Chief Complaint  Patient presents with   Flank Pain    Pt c/o left flank pain and lower back pain since yesterday, denies any injury, no urinary symptoms.    HPI Teresa Stanley is a 77 y.o. female.   Patient complains of right lower back pain that started yesterday.  She denies injury or trauma.  She denies dysuria, increased urinary frequency, hematuria, fever, chills.  Denies numbness, tingling, weakness, loss of bowel or bladder control, saddle anesthesia.  She is tried Tylenol for the symptoms with minimal relief.  She reports pain is worse with movement and going from sitting to standing.  She is diabetic, A1c 6.5.    Past Medical History:  Diagnosis Date   Arthritis    Diabetes mellitus    takes Actos and Metformin daily and Victoza   GERD (gastroesophageal reflux disease)    takes Omeprazole daily   Headache(784.0)    occasionally   Hyperlipidemia    takes Atorvastatin daily   Hypertension    takes Lisinopril,Amlodipine,and Metoprolol daily   Hypothyroidism    Joint pain    Thyroid disease    ?    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   S/P total knee arthroplasty 04/18/2013   Unilateral primary osteoarthritis, right knee 04/04/2013    Class: Diagnosis of   Candidal intertrigo 08/09/2012   Hypertension 12/15/2011   Type 2 diabetes mellitus with stage 3b chronic kidney disease, with long-term current use of insulin (HCC) 12/15/2011    Past Surgical History:  Procedure Laterality Date   ABDOMINAL HYSTERECTOMY     BREAST BIOPSY Left    BREAST EXCISIONAL BIOPSY Left    CARDIAC  CATHETERIZATION  2009   CATARACT EXTRACTION Right    EYE SURGERY     JOINT REPLACEMENT     KNEE ARTHROPLASTY Left 04/04/2013   Procedure: COMPUTER ASSISTED TOTAL KNEE ARTHROPLASTY;  Surgeon: Eldred Manges, MD;  Location: [facility];  Service: Orthopedics;  Laterality: Left;  Left Total Knee Arthroplasty, Cemented   TOTAL KNEE ARTHROPLASTY Right 11/26/2015   Procedure: TOTAL KNEE ARTHROPLASTY;  Surgeon: Eldred Manges, MD;  Location: [facility];  Service: Orthopedics;  Laterality: Right;    OB History   No obstetric history on file.      Home Medications    Prior to Admission medications   Medication Sig Start Date End Date Taking? Authorizing Provider  predniSONE (DELTASONE) 20 MG tablet Take 2 tablets (40 mg total) by mouth daily for 5 days. 07/29/22 08/03/22 Yes Ward, Tylene Fantasia, PA-C  amLODipine (NORVASC) 10 MG tablet Take 1 tablet (10 mg total) by mouth daily. 12/19/21   Dorothyann Peng, MD  aspirin 81 MG tablet Take 81 mg by mouth daily.    [provider]  atorvastatin (LIPITOR) 40 MG tablet TAKE 1 TABLET BY MOUTH EVERY DAY 06/19/22   Dorothyann Peng, MD  BD PEN NEEDLE NANO 2ND GEN 32G X 4 MM MISC USE WITH TRESIBA 07/01/21   Dorothyann Peng, MD  Blood Glucose Calibration (OT ULTRA/FASTTK CNTRL SOLN) SOLN  01/08/21   [provider]  Blood Glucose Monitoring Suppl (ONE TOUCH ULTRA 2) w/Device KIT Use as directed to check blood sugars 2 times per day dx:e11.22 06/08/18   Dorothyann Peng, MD  carboxymethylcellulose (REFRESH PLUS) 0.5 % SOLN Place 1 drop into both eyes daily.    [provider]  chlorthalidone (HYGROTON) 25 MG tablet Take 25 mg by mouth daily. 10/11/19   [provider]  cholecalciferol (VITAMIN D3) 25 MCG (1000 UNIT) tablet Take 1,000 Units by mouth daily.    [provider]  famotidine (HM FAMOTIDINE) 10 MG tablet Take 1 tablet (10 mg total) by mouth daily. 03/12/22 09/08/22  Dorothyann Peng, MD  gabapentin (NEURONTIN) 300 MG capsule TAKE 1 CAPSULE  BY MOUTH EVERYDAY AT BEDTIME 01/14/22   Dorothyann Peng, MD  hydrALAZINE (APRESOLINE) 25 MG tablet TAKE 1 TABLET BY MOUTH TWICE A DAY 11/23/20   Dorothyann Peng, MD  insulin degludec (TRESIBA FLEXTOUCH) 100 UNIT/ML SOPN FlexTouch Pen Inject 16 Units into the skin at bedtime.     [provider]  meclizine (ANTIVERT) 25 MG tablet TAKE 1 TABLET (25 MG TOTAL) BY MOUTH 3 (THREE) TIMES DAILY AS NEEDED FOR DIZZINESS. 02/22/20   Dorothyann Peng, MD  methimazole (TAPAZOLE) 5 MG tablet TAKE 1 TABLET (5 MG TOTAL) BY MOUTH DAILY. 05/13/22   Dorothyann Peng, MD  olmesartan (BENICAR) 40 MG tablet Take 1 tablet (40 mg total) by mouth daily. 10/09/20 07/21/22  Dorothyann Peng, MD  OneTouch Delica Lancets 33G MISC Use as directed to check blood sugars 2 times per day dx: e11.22 06/25/20   Dorothyann Peng, MD  [facility] ULTRA test strip USE AS INSTRUCTED TO CHECK BLOOD SUGARS 2 TIMES PER DAY DX: E11.22 02/11/21   Dorothyann Peng, MD  propranolol ER (INDERAL LA) 120 MG 24 hr capsule TAKE 1 CAPSULE BY MOUTH EVERY DAY 07/18/22   Dorothyann Peng, MD  Semaglutide, 1 MG/DOSE, 4 MG/3ML SOPN Inject 1 mg into the skin once a week. 06/18/20   Dorothyann Peng, MD  sodium bicarbonate 650 MG tablet TAKE 1 TABLET BY MOUTH EVERY DAY 09/20/21   Dorothyann Peng, MD  triamcinolone cream (KENALOG) 0.1 % Apply 1 application topically 2 (two) times daily. Apply 08/09/19   Dorothyann Peng, MD    Family History Family History  Problem Relation Age of Onset   Diabetes Mother    Hypertension Mother    Diabetes Father    Asthma Father    Hypertension Father    Breast cancer Sister 37   Hypertension Sister        2   Diabetes Sister    Hypertension Daughter    Hypertension Brother        5   Kidney disease Brother    Colon cancer Neg Hx    Esophageal cancer Neg Hx    Rectal cancer Neg Hx    Stomach cancer Neg Hx     Social History Social History   Tobacco Use   Smoking status: Never    Passive exposure: Never   Smokeless tobacco: Never   Vaping Use   Vaping Use: Never used  Substance Use Topics   Alcohol use: No   Drug use: No     Allergies   Patient has no known allergies.   Review of Systems Review of Systems  Constitutional:  Negative for chills and fever.  HENT:  Negative for ear pain and sore throat.   Eyes:  Negative for pain and visual disturbance.  Respiratory:  Negative for cough and shortness  of breath.   Cardiovascular:  Negative for chest pain and palpitations.  Gastrointestinal:  Negative for abdominal pain and vomiting.  Genitourinary:  Negative for dysuria and hematuria.  Musculoskeletal:  Positive for back pain. Negative for arthralgias.  Skin:  Negative for color change and rash.  Neurological:  Negative for seizures and syncope.  All other systems reviewed and are negative.    Physical Exam Triage Vital Signs ED Triage Vitals  Enc Vitals Group     BP 07/29/22 1700 (!) 165/91     Pulse Rate 07/29/22 1700 74     Resp 07/29/22 1700 18     Temp 07/29/22 1700 98.5 F (36.9 C)     Temp Source 07/29/22 1700 Oral     SpO2 07/29/22 1700 96 %     Weight 07/29/22 1658 238 lb 1.6 oz (108 kg)     Height 07/29/22 1658 5\' 1"  (1.549 m)     Head Circumference --      Peak Flow --      Pain Score 07/29/22 1658 6     Pain Loc --      Pain Edu? --      Excl. in GC? --    No data found.  Updated Vital Signs BP (!) 165/91 (BP Location: Left Arm)   Pulse 74   Temp 98.5 F (36.9 C) (Oral)   Resp 18   Ht 5\' 1"  (1.549 m)   Wt 238 lb 1.6 oz (108 kg)   SpO2 96%   BMI 44.99 kg/m   Visual Acuity Right Eye Distance:   Left Eye Distance:   Bilateral Distance:    Right Eye Near:   Left Eye Near:    Bilateral Near:     Physical Exam Vitals and nursing note reviewed.  Constitutional:      General: She is not in acute distress.    Appearance: She is well-developed.  HENT:     Head: Normocephalic and atraumatic.  Eyes:     Conjunctiva/sclera: Conjunctivae normal.  Cardiovascular:      Rate and Rhythm: Normal rate and regular rhythm.     Heart sounds: No murmur heard. Pulmonary:     Effort: Pulmonary effort is normal. No respiratory distress.     Breath sounds: Normal breath sounds.  Abdominal:     Palpations: Abdomen is soft.     Tenderness: There is no abdominal tenderness.  Musculoskeletal:        General: No swelling.     Cervical back: Neck supple.     Comments: Right lumbar paraspinal musculature with tenderness to palpation and spasm noted.  Skin:    General: Skin is warm and dry.     Capillary Refill: Capillary refill takes less than 2 seconds.  Neurological:     Mental Status: She is alert.  Psychiatric:        Mood and Affect: Mood normal.      UC Treatments / Results  Labs (all labs ordered are listed, but only abnormal results are displayed) Labs Reviewed  POCT URINALYSIS DIP (MANUAL ENTRY) - Abnormal; Notable for the following components:      Result Value   Protein Ur, POC =30 (*)    Leukocytes, UA Small (1+) (*)    All other components within normal limits  URINE CULTURE    EKG   Radiology No results found.  Procedures Procedures (including critical care time)  Medications Ordered in UC Medications - No data to display  Initial  Impression / Assessment and Plan / UC Course  I have reviewed the triage vital signs and the nursing notes.  Pertinent labs & imaging results that were available during my care of the patient were reviewed by me and considered in my medical decision making (see chart for details).     Right lower back pain.  UA normal, urine culture sent.  Pain appears to be musculature in nature.  Advised to continue Tylenol as needed.  Short course of steroids prescribed.  Patient's diabetes is well-controlled.  Advised to monitor sugars.  Advise follow-up with PCP. Final Clinical Impressions(s) / UC Diagnoses   Final diagnoses:  Acute left-sided low back pain without sciatica     Discharge Instructions       Your urine shows no signs of infection Recommend Tylenol as needed Recommend ice to affected area Take prednisone as prescribed    ED Prescriptions     Medication Sig Dispense Auth. Provider   predniSONE (DELTASONE) 20 MG tablet Take 2 tablets (40 mg total) by mouth daily for 5 days. 10 tablet Ward, Tylene Fantasia, PA-C      PDMP not reviewed this encounter.   Ward, Tylene Fantasia, PA-C 07/29/22 1737

## 2022-07-29 NOTE — ED Triage Notes (Signed)
Pt c/o left flank pain and lower back pain since yesterday, denies any injury, no urinary symptoms.

## 2022-07-29 NOTE — Discharge Instructions (Addendum)
Your urine shows no signs of infection Recommend Tylenol as needed Recommend ice to affected area Take prednisone as prescribed

## 2022-07-30 LAB — URINE CULTURE

## 2022-07-31 LAB — URINE CULTURE: Culture: >=100000 — AB

## 2022-08-01 ENCOUNTER — Telehealth (HOSPITAL_COMMUNITY): Payer: Self-pay | Admitting: Emergency Medicine

## 2022-08-01 MED ORDER — SULFAMETHOXAZOLE-TRIMETHOPRIM 800-160 MG PO TABS: 1.0000 | ORAL_TABLET | Freq: Two times a day (BID) | ORAL | 0 refills | Status: AC

## 2022-08-04 ENCOUNTER — Ambulatory Visit: Payer: Medicare Other

## 2022-08-05 ENCOUNTER — Ambulatory Visit: Payer: Medicare Other | Admitting: Orthopaedic Surgery

## 2022-08-08 NOTE — Progress Notes (Signed)
[facility] PRE[facility] Evaluation  Patient Details  Name: Teresa Stanley MRN: [id number] Date of Birth: 1945/11/30 Age: 77 y.o. PCP: Dorothyann Peng, MD  Vitals:   08/07/22 0825  BP: (!) 140/72  Pulse: 86  SpO2: 97%  Weight: 248 lb 9.6 oz (112.8 kg)     [facility] Eval - 08/08/22 0800       [facility] "PREP" Location   [facility] "PRE[facility]" Location [facility]      Referral    Referring Provider Sanders    Program Start Date 04/29/22    Program End Date 07/22/22      Measurement   Waist Circumference 52 inches    Waist Circumference End Program 51 inches    Hip Circumference 56.5 inches    Hip Circumference End Program 57 inches   difficult to measure due to bent forward posture   Body fat 49.7 percent      Information for Trainer   Goals Reset      Mobility and Daily Activities   I find it easy to walk up or down two or more flights of stairs. 2    I have no trouble taking out the trash. 4    I do housework such as vacuuming and dusting on my own without difficulty. 4    I can easily lift a gallon of milk (8lbs). 4    I can easily walk a mile. 1    I have no trouble reaching into high cupboards or reaching down to pick up something from the floor. 3    I do not have trouble doing out-door work such as Loss adjuster, chartered, raking leaves, or gardening. 1      Mobility and Daily Activities   I feel younger than my age. 3    I feel independent. 3    I feel energetic. 2    I live an active life.  4    I feel strong. 2    I feel healthy. 3    I feel active as other people my age. 4      How fit and strong are you.   Fit and Strong Total Score 40            Past Medical History:  Diagnosis Date   Arthritis    Diabetes mellitus    takes Actos and Metformin daily and Victoza   GERD (gastroesophageal reflux disease)    takes Omeprazole daily   Headache(784.0)    occasionally   Hyperlipidemia    takes Atorvastatin daily   Hypertension    takes Lisinopril,Amlodipine,and Metoprolol daily    Hypothyroidism    Joint pain    Thyroid disease    ?   Past Surgical History:  Procedure Laterality Date   ABDOMINAL HYSTERECTOMY     BREAST BIOPSY Left    BREAST EXCISIONAL BIOPSY Left    CARDIAC CATHETERIZATION  2009   CATARACT EXTRACTION Right    EYE SURGERY     JOINT REPLACEMENT     KNEE ARTHROPLASTY Left 04/04/2013   Procedure: COMPUTER ASSISTED TOTAL KNEE ARTHROPLASTY;  Surgeon: Eldred Manges, MD;  Location: [facility];  Service: Orthopedics;  Laterality: Left;  Left Total Knee Arthroplasty, Cemented   TOTAL KNEE ARTHROPLASTY Right 11/26/2015   Procedure: TOTAL KNEE ARTHROPLASTY;  Surgeon: Eldred Manges, MD;  Location: [facility];  Service: Orthopedics;  Laterality: Right;   Social History   Tobacco Use  Smoking Status Never   Passive exposure: Never  Smokeless Tobacco Never  Attended 14 sessions and 8 educational sessions.  Fit testing: Cardio march 200 to 120 with cane Sit to stand: 4 to 6 with hands down Bicep curl: 17 to 19 Needs assist for balance.  Encouraged to cont to exercise both cardio and strength training. Water as beverage, Home exercises for balance, keep sodium below 1500 mg per day. Limit processed meats. Keep added sugars below 24 g per day. Sleep 7-9 hours each night (no tv), Keep shoulders over hip bones, stand up tall  Bonnye Fava 08/08/2022, 8:34 AM

## 2022-08-11 ENCOUNTER — Ambulatory Visit: Payer: Medicare Other | Admitting: Cardiology

## 2022-08-11 ENCOUNTER — Encounter: Payer: Self-pay | Admitting: Cardiology

## 2022-08-11 VITALS — BP 130/76 | HR 89 | Resp 16 | Ht 61.0 in | Wt 227.0 lb

## 2022-08-11 DIAGNOSIS — E1122 Type 2 diabetes mellitus with diabetic chronic kidney disease: Secondary | ICD-10-CM

## 2022-08-11 DIAGNOSIS — R0609 Other forms of dyspnea: Secondary | ICD-10-CM | POA: Diagnosis not present

## 2022-08-11 DIAGNOSIS — I10 Essential (primary) hypertension: Secondary | ICD-10-CM

## 2022-08-11 DIAGNOSIS — I129 Hypertensive chronic kidney disease with stage 1 through stage 4 chronic kidney disease, or unspecified chronic kidney disease: Secondary | ICD-10-CM | POA: Diagnosis not present

## 2022-08-11 DIAGNOSIS — Z794 Long term (current) use of insulin: Secondary | ICD-10-CM | POA: Diagnosis not present

## 2022-08-11 DIAGNOSIS — N1832 Chronic kidney disease, stage 3b: Secondary | ICD-10-CM | POA: Diagnosis not present

## 2022-08-11 NOTE — Progress Notes (Signed)
Primary Physician/Referring:  Dorothyann Peng, MD  Patient ID: Teresa Stanley, female    DOB: [birth year], 77 y.o.   MRN: [id number]  Chief Complaint  Patient presents with   Abnormal ECG   Shortness of Breath   New Patient (Initial Visit)    Referred by Dr. Allyne Gee   HPI:    Teresa Stanley  is a 77 y.o.  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 Patient has chronic dyspnea on exertion and states that it has been stable over many years.  Denies exertional chest pain.  [facility].  Her daughter is present.  No chest pain, dizziness or palpitations.  No leg edema, PND or orthopnea.  She has been losing weight since she went on Ozempic.  Past Medical History:  Diagnosis Date   Arthritis    Diabetes mellitus    takes Actos and Metformin daily and Victoza   GERD (gastroesophageal reflux disease)    takes Omeprazole daily   Headache(784.0)    occasionally   Hyperlipidemia    takes Atorvastatin daily   Hypertension    takes Lisinopril,Amlodipine,and Metoprolol daily   Hypothyroidism    Joint pain    Thyroid disease    ?   Past Surgical History:  Procedure Laterality Date   ABDOMINAL HYSTERECTOMY     BREAST BIOPSY Left    BREAST EXCISIONAL BIOPSY Left    CARDIAC CATHETERIZATION  2009   CATARACT EXTRACTION Right    EYE SURGERY     JOINT REPLACEMENT     KNEE ARTHROPLASTY Left 04/04/2013   Procedure: COMPUTER ASSISTED TOTAL KNEE ARTHROPLASTY;  Surgeon: Eldred Manges, MD;  Location[facility];  Service: Orthopedics;  Laterality: Left;  Left Total Knee Arthroplasty, Cemented   TOTAL KNEE ARTHROPLASTY Right 11/26/2015   Procedure: TOTAL KNEE ARTHROPLASTY;  Surgeon: Eldred Manges, MD;  [facility];  Service: Orthopedics;  Laterality: Right;   Family History  Problem Relation Age of Onset   Diabetes  Mother    Hypertension Mother    Diabetes Father    Asthma Father    Hypertension Father    Breast cancer Sister 8   Hypertension Sister        2   Hypertension Sister    Diabetes Sister    Hypertension Brother        5   Kidney disease Brother    Hypertension Daughter    Colon cancer Neg Hx    Esophageal cancer Neg Hx    Rectal cancer Neg Hx    Stomach cancer Neg Hx     Social History   Tobacco Use   Smoking status: Never    Passive exposure: Never   Smokeless tobacco: Never  Substance Use Topics   Alcohol use: No   Marital Status: Widowed  ROS  Review of Systems  Cardiovascular:  Positive for dyspnea on exertion. Negative for chest pain and leg swelling.   Objective      08/11/2022    2:12 PM 08/11/2022    1:32 PM 08/11/2022    1:23 PM  Vitals with BMI  Height   5\' 1"   Weight   227 lbs  BMI   42.91  Systolic 130 160 409  Diastolic 76 96 83  Pulse  89 87   Blood pressure 130/76, pulse 89, resp. rate 16, height  5\' 1"  (1.549 m), weight 227 lb (103 kg), SpO2 91 %.  Physical Exam Constitutional:      Comments: Morbidly obese in no acute distress.  Neck:     Vascular: No carotid bruit or JVD.  Cardiovascular:     Rate and Rhythm: Normal rate and regular rhythm.     Pulses: Intact distal pulses.          Carotid pulses are 2+ on the right side and 2+ on the left side.    Heart sounds: Normal heart sounds. No murmur heard.    No gallop.  Pulmonary:     Effort: Pulmonary effort is normal.     Breath sounds: Normal breath sounds.  Abdominal:     General: Bowel sounds are normal.     Palpations: Abdomen is soft.     Comments: Obese. Pannus present  Musculoskeletal:     Right lower leg: No edema.     Left lower leg: No edema.     Laboratory examination:   Recent Labs    12/31/21 1551 06/04/22 1118 07/21/22 1517  NA 140 139 140  K 5.3* 5.5* 5.3*  CL 100 101 101  CO2 26 25 26   GLUCOSE 77 176* 86  BUN 38* 30* 29*  CREATININE 1.80* 1.74* 1.59*   CALCIUM 9.8 9.7 9.7    Lab Results  Component Value Date   GLUCOSE 86 07/21/2022   NA 140 07/21/2022   K 5.3 (H) 07/21/2022   CL 101 07/21/2022   CO2 26 07/21/2022   BUN 29 (H) 07/21/2022   CREATININE 1.59 (H) 07/21/2022   EGFR 34 (L) 07/21/2022   CALCIUM 9.7 07/21/2022   PHOS 3.7 07/21/2022   PROT 7.1 07/21/2022   ALBUMIN 4.0 06/04/2022   LABGLOB 3.4 07/21/2022   AGRATIO 1.1 07/21/2022   BILITOT 0.4 06/04/2022   ALKPHOS 129 (H) 06/04/2022   AST 15 06/04/2022   ALT 11 06/04/2022   ANIONGAP 8 07/09/2016      Lab Results  Component Value Date   ALT 11 06/04/2022   AST 15 06/04/2022   ALKPHOS 129 (H) 06/04/2022   BILITOT 0.4 06/04/2022       Latest Ref Rng & Units 07/21/2022    3:17 PM 07/10/2021   12:00 AM 06/18/2020   12:10 PM  CBC  WBC 3.4 - 10.8 x10E3/uL 7.3  7.2  8.4   Hemoglobin 11.1 - 15.9 g/dL 78.2  95.6  21.3   Hematocrit 34.0 - 46.6 % 39.7  35.6  40.5   Platelets 150 - 450 x10E3/uL 282  270  388        Latest Ref Rng & Units 07/21/2022    3:17 PM 06/04/2022   11:18 AM 07/10/2021   12:00 AM  Hepatic Function  Total Protein 6.0 - 8.5 g/dL 7.1  6.8  6.7   Albumin 3.8 - 4.8 g/dL  4.0  3.8   AST 0 - 40 IU/L  15  16   ALT 0 - 32 IU/L  11  11   Alk Phosphatase 44 - 121 IU/L  129  116   Total Bilirubin 0.0 - 1.2 mg/dL  0.4  0.3     Lipid Panel Recent Labs    04/03/22 1209  CHOL 168  TRIG 99  LDLCALC 80  HDL 70  CHOLHDL 2.4    HEMOGLOBIN A1C Lab Results  Component Value Date   HGBA1C 6.5 (H) 07/21/2022   MPG 189 01/27/2008   TSH Recent Labs  12/31/21 1551  TSH 2.550    Radiology:    Cardiac Studies:   NA  EKG:   EKG 08/11/2022: Normal sinus rhythm with rate of 89 bpm, left axis deviation, left anterior fascicular block.  Cannot exclude anteroseptal infarct old.  No evidence of ischemia.  EKG 07/21/2022: Normal sinus rhythm with rate of 83 bpm, LAE, left axis deviation, left anterior fascicular block.  Single PVC. Medications and  allergies  No Known Allergies   Medication list   Current Outpatient Medications:    amLODipine (NORVASC) 10 MG tablet, Take 1 tablet (10 mg total) by mouth daily., Disp: 90 tablet, Rfl: 2   aspirin 81 MG tablet, Take 81 mg by mouth daily., Disp: , Rfl:    atorvastatin (LIPITOR) 40 MG tablet, TAKE 1 TABLET BY MOUTH EVERY DAY, Disp: 90 tablet, Rfl: 2   BD PEN NEEDLE NANO 2ND GEN 32G X 4 MM MISC, USE WITH TRESIBA, Disp: 100 each, Rfl: 10   Blood Glucose Calibration (OT ULTRA/FASTTK CNTRL SOLN) SOLN, , Disp: , Rfl:    Blood Glucose Monitoring Suppl (ONE TOUCH ULTRA 2) w/Device KIT, Use as directed to check blood sugars 2 times per day dx:e11.22, Disp: 1 each, Rfl: 1   carboxymethylcellulose (REFRESH PLUS) 0.5 % SOLN, Place 1 drop into both eyes daily., Disp: , Rfl:    chlorthalidone (HYGROTON) 25 MG tablet, Take 25 mg by mouth daily., Disp: , Rfl:    cholecalciferol (VITAMIN D3) 25 MCG (1000 UNIT) tablet, Take 1,000 Units by mouth daily., Disp: , Rfl:    famotidine (HM FAMOTIDINE) 10 MG tablet, Take 1 tablet (10 mg total) by mouth daily., Disp: 90 tablet, Rfl: 1   gabapentin (NEURONTIN) 300 MG capsule, TAKE 1 CAPSULE BY MOUTH EVERYDAY AT BEDTIME, Disp: 90 capsule, Rfl: 2   hydrALAZINE (APRESOLINE) 25 MG tablet, TAKE 1 TABLET BY MOUTH TWICE A DAY, Disp: 60 tablet, Rfl: 1   insulin degludec (TRESIBA FLEXTOUCH) 100 UNIT/ML SOPN FlexTouch Pen, Inject 16 Units into the skin at bedtime. , Disp: , Rfl:    meclizine (ANTIVERT) 25 MG tablet, TAKE 1 TABLET (25 MG TOTAL) BY MOUTH 3 (THREE) TIMES DAILY AS NEEDED FOR DIZZINESS., Disp: 30 tablet, Rfl: 0   methimazole (TAPAZOLE) 5 MG tablet, TAKE 1 TABLET (5 MG TOTAL) BY MOUTH DAILY., Disp: 90 tablet, Rfl: 1   olmesartan (BENICAR) 40 MG tablet, Take 1 tablet (40 mg total) by mouth daily., Disp: 90 tablet, Rfl: 2   OneTouch Delica Lancets 33G MISC, Use as directed to check blood sugars 2 times per day dx: e11.22, Disp: 300 each, Rfl: 2   ONETOUCH ULTRA test  strip, USE AS INSTRUCTED TO CHECK BLOOD SUGARS 2 TIMES PER DAY DX: E11.22, Disp: 200 strip, Rfl: 4   propranolol ER (INDERAL LA) 120 MG 24 hr capsule, TAKE 1 CAPSULE BY MOUTH EVERY DAY, Disp: 90 capsule, Rfl: 2   Semaglutide, 1 MG/DOSE, 4 MG/3ML SOPN, Inject 1 mg into the skin once a week., Disp: 3 mL, Rfl: 2   sodium bicarbonate 650 MG tablet, TAKE 1 TABLET BY MOUTH EVERY DAY, Disp: 90 tablet, Rfl: 3   triamcinolone cream (KENALOG) 0.1 %, Apply 1 application topically 2 (two) times daily. Apply, Disp: 45 g, Rfl: 1  Assessment     ICD-10-CM   1. Dyspnea on exertion  R06.09 EKG 12-Lead    2. Primary hypertension  I10     3. Type 2 diabetes mellitus with stage 3b chronic kidney disease, with long-term current  use of insulin (HCC)  E11.22    N18.32    Z79.4     4. Class 3 severe obesity due to excess calories with serious comorbidity and body mass index (BMI) of 45.0 to 49.9 in adult [facility])  E66.01    Z68.42        Orders Placed This Encounter  Procedures   EKG 12-Lead    No orders of the defined types were placed in this encounter.   There are no discontinued medications.   Recommendations:   Teresa Stanley is a 77 y.o. 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  1. 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  She is on best medical therapy.  All her risk factors are very well-controlled including hypertension, diabetes mellitus and hypercholesterolemia.  In view of this conservative therapy is recommended.   Discussed with Dr. Allyne Gee. - EKG 12-Lead  2. Primary hypertension Blood pressure is under excellent control, patient is also on an ARB, low-dose beta-blocker and also calcium channel blocker with excellent control.  3. Type 2 diabetes mellitus with stage 3b chronic kidney disease, with long-term current use of insulin (HCC) Patient's diabetes is well-controlled.  She is also doing the best to lose weight and has been tolerating all her medications well.  4. Class 3 severe obesity due to excess calories with serious comorbidity and body mass index (BMI) of 45.0 to 49.9 in adult [employer]) Obesity related to excess calories, patient is trying to make changes to her diet and also since being on Ozempic, has noticed weight loss as well.  Continue the same.  I will see her back on a as needed basis.  Only consideration to be had would be to add [place] both from hypertension standpoint and diabetes with stage IIIb chronic kidney disease.  However her potassium level has been on the higher limit and hence may not be a good option.  Overall she is on best medical therapy.    Teresa Decamp, MD, [facility] 08/15/2022, 8:47 AM Office: [phone]

## 2022-08-12 DIAGNOSIS — N1832 Chronic kidney disease, stage 3b: Secondary | ICD-10-CM | POA: Diagnosis not present

## 2022-08-20 DIAGNOSIS — N1832 Chronic kidney disease, stage 3b: Secondary | ICD-10-CM | POA: Diagnosis not present

## 2022-08-20 DIAGNOSIS — I129 Hypertensive chronic kidney disease with stage 1 through stage 4 chronic kidney disease, or unspecified chronic kidney disease: Secondary | ICD-10-CM | POA: Diagnosis not present

## 2022-08-20 DIAGNOSIS — E875 Hyperkalemia: Secondary | ICD-10-CM | POA: Diagnosis not present

## 2022-08-20 DIAGNOSIS — D631 Anemia in chronic kidney disease: Secondary | ICD-10-CM | POA: Diagnosis not present

## 2022-08-20 DIAGNOSIS — N2581 Secondary hyperparathyroidism of renal origin: Secondary | ICD-10-CM | POA: Diagnosis not present

## 2022-08-20 DIAGNOSIS — R809 Proteinuria, unspecified: Secondary | ICD-10-CM | POA: Diagnosis not present

## 2022-08-20 DIAGNOSIS — E1122 Type 2 diabetes mellitus with diabetic chronic kidney disease: Secondary | ICD-10-CM | POA: Diagnosis not present

## 2022-08-27 ENCOUNTER — Ambulatory Visit
Admission: RE | Admit: 2022-08-27 | Discharge: 2022-08-27 | Disposition: A | Discharge status: Home / Self Care | Payer: Medicare Other | Source: Ambulatory Visit | Attending: Internal Medicine | Admitting: Internal Medicine

## 2022-08-27 DIAGNOSIS — Z1231 Encounter for screening mammogram for malignant neoplasm of breast: Secondary | ICD-10-CM | POA: Diagnosis not present

## 2022-09-04 ENCOUNTER — Ambulatory Visit: Payer: Medicare Other

## 2022-09-04 DIAGNOSIS — R0609 Other forms of dyspnea: Secondary | ICD-10-CM

## 2022-09-07 ENCOUNTER — Other Ambulatory Visit: Payer: Self-pay | Admitting: Internal Medicine

## 2022-10-24 ENCOUNTER — Other Ambulatory Visit: Payer: Self-pay | Admitting: Internal Medicine

## 2022-11-20 ENCOUNTER — Encounter: Payer: Self-pay | Admitting: Internal Medicine

## 2022-11-20 ENCOUNTER — Ambulatory Visit: Payer: Medicare Other | Admitting: Internal Medicine

## 2022-11-20 VITALS — BP 128/82 | HR 75 | Temp 97.8°F | Ht 61.0 in | Wt 234.4 lb

## 2022-11-20 DIAGNOSIS — H9201 Otalgia, right ear: Secondary | ICD-10-CM | POA: Diagnosis not present

## 2022-11-20 DIAGNOSIS — E1122 Type 2 diabetes mellitus with diabetic chronic kidney disease: Secondary | ICD-10-CM

## 2022-11-20 DIAGNOSIS — N1832 Chronic kidney disease, stage 3b: Secondary | ICD-10-CM | POA: Diagnosis not present

## 2022-11-20 DIAGNOSIS — E782 Mixed hyperlipidemia: Secondary | ICD-10-CM | POA: Diagnosis not present

## 2022-11-20 DIAGNOSIS — I129 Hypertensive chronic kidney disease with stage 1 through stage 4 chronic kidney disease, or unspecified chronic kidney disease: Secondary | ICD-10-CM

## 2022-11-20 DIAGNOSIS — Z6841 Body Mass Index (BMI) 40.0 and over, adult: Secondary | ICD-10-CM

## 2022-11-20 DIAGNOSIS — Z23 Encounter for immunization: Secondary | ICD-10-CM | POA: Diagnosis not present

## 2022-11-20 DIAGNOSIS — E66813 Obesity, class 3: Secondary | ICD-10-CM

## 2022-11-20 DIAGNOSIS — Z794 Long term (current) use of insulin: Secondary | ICD-10-CM | POA: Diagnosis not present

## 2022-11-20 NOTE — Progress Notes (Signed)
I,[name], CMA,acting as a Neurosurgeon for [name], MD.,have documented all relevant documentation on the behalf of [name], MD,as directed by  [name], MD while in the presence of [name], MD.  Subjective:  Patient ID: Teresa Stanley , female    DOB: Nov 27, 1945 , 77 y.o.   MRN: [id number]  Chief Complaint  Patient presents with   Diabetes   Hypertension    HPI  She presents today for f/u DM/HTN f/u. She reports compliance with meds. She denies having any headaches, chest pain and shortness of breath.  She would like her right ear checked. She feels it is " stopped up".  She denies having any fever/chills.   States her sugars are fine, running between 95-106.   Diabetes She presents for her follow-up diabetic visit. She has type 2 diabetes mellitus. Her disease course has been stable. There are no hypoglycemic associated symptoms. Pertinent negatives for diabetes include no blurred vision and no chest pain. There are no hypoglycemic complications. Diabetic complications include nephropathy. Risk factors for coronary artery disease include diabetes mellitus, dyslipidemia, obesity, post-menopausal and sedentary lifestyle. She is compliant with treatment some of the time. Her weight is decreasing steadily. She is following a generally healthy diet. She participates in exercise intermittently. Her breakfast blood glucose is taken between 8-9 am. Her breakfast blood glucose range is generally 130-140 mg/dl. An ACE inhibitor/angiotensin II receptor blocker is being taken. Eye exam is current.  Hypertension The current episode started more than 1 year ago. The problem has been gradually improving since onset. The problem is uncontrolled. Pertinent negatives include no blurred vision, chest pain, palpitations or shortness of breath. The current treatment provides moderate improvement. Compliance problems include exercise.  Hypertensive end-organ damage includes kidney  disease. Identifiable causes of hypertension include chronic renal disease.  Hyperlipidemia This is a chronic problem. The problem is uncontrolled. Exacerbating diseases include chronic renal disease, diabetes and obesity. Pertinent negatives include no chest pain or shortness of breath. Current antihyperlipidemic treatment includes statins. The current treatment provides moderate improvement of lipids. Compliance problems include adherence to exercise.  Risk factors for coronary artery disease include diabetes mellitus, dyslipidemia, hypertension, post-menopausal, a sedentary lifestyle and obesity.     Past Medical History:  Diagnosis Date   Arthritis    Diabetes mellitus    takes Actos and Metformin daily and Victoza   GERD (gastroesophageal reflux disease)    takes Omeprazole daily   Headache(784.0)    occasionally   Hyperlipidemia    takes Atorvastatin daily   Hypertension    takes Lisinopril,Amlodipine,and Metoprolol daily   Hypothyroidism    Joint pain    Thyroid disease    ?     Family History  Problem Relation Age of Onset   Diabetes Mother    Hypertension Mother    Diabetes Father    Asthma Father    Hypertension Father    Breast cancer Sister 67   Hypertension Sister        2   Hypertension Sister    Diabetes Sister    Hypertension Brother        5   Kidney disease Brother    Hypertension Daughter    Colon cancer Neg Hx    Esophageal cancer Neg Hx    Rectal cancer Neg Hx    Stomach cancer Neg Hx      Current Outpatient Medications:    amLODipine (NORVASC) 10 MG tablet, TAKE 1  TABLET BY MOUTH EVERY DAY, Disp: 90 tablet, Rfl: 2   aspirin 81 MG tablet, Take 81 mg by mouth daily., Disp: , Rfl:    atorvastatin (LIPITOR) 40 MG tablet, TAKE 1 TABLET BY MOUTH EVERY DAY, Disp: 90 tablet, Rfl: 2   BD PEN NEEDLE NANO 2ND GEN 32G X 4 MM MISC, USE WITH TRESIBA, Disp: 100 each, Rfl: 10   Blood Glucose Calibration (OT ULTRA/FASTTK CNTRL SOLN) SOLN, , Disp: , Rfl:     Blood Glucose Monitoring Suppl (ONE TOUCH ULTRA 2) w/Device KIT, Use as directed to check blood sugars 2 times per day dx:e11.22, Disp: 1 each, Rfl: 1   carboxymethylcellulose (REFRESH PLUS) 0.5 % SOLN, Place 1 drop into both eyes daily., Disp: , Rfl:    chlorthalidone (HYGROTON) 25 MG tablet, Take 25 mg by mouth daily., Disp: , Rfl:    cholecalciferol (VITAMIN D3) 25 MCG (1000 UNIT) tablet, Take 1,000 Units by mouth daily., Disp: , Rfl:    gabapentin (NEURONTIN) 300 MG capsule, TAKE 1 CAPSULE BY MOUTH EVERYDAY AT BEDTIME, Disp: 90 capsule, Rfl: 2   hydrALAZINE (APRESOLINE) 25 MG tablet, TAKE 1 TABLET BY MOUTH TWICE A DAY, Disp: 60 tablet, Rfl: 1   insulin degludec (TRESIBA FLEXTOUCH) 100 UNIT/ML SOPN FlexTouch Pen, Inject 16 Units into the skin at bedtime. , Disp: , Rfl:    meclizine (ANTIVERT) 25 MG tablet, TAKE 1 TABLET (25 MG TOTAL) BY MOUTH 3 (THREE) TIMES DAILY AS NEEDED FOR DIZZINESS., Disp: 30 tablet, Rfl: 0   methimazole (TAPAZOLE) 5 MG tablet, TAKE 1 TABLET (5 MG TOTAL) BY MOUTH DAILY., Disp: 90 tablet, Rfl: 1   OneTouch Delica Lancets 33G MISC, Use as directed to check blood sugars 2 times per day dx: e11.22, Disp: 300 each, Rfl: 2   ONETOUCH ULTRA test strip, USE AS INSTRUCTED TO CHECK BLOOD SUGARS 2 TIMES PER DAY DX: E11.22, Disp: 200 strip, Rfl: 4   propranolol ER (INDERAL LA) 120 MG 24 hr capsule, TAKE 1 CAPSULE BY MOUTH EVERY DAY, Disp: 90 capsule, Rfl: 2   Semaglutide, 1 MG/DOSE, 4 MG/3ML SOPN, Inject 1 mg into the skin once a week., Disp: 3 mL, Rfl: 2   sodium bicarbonate 650 MG tablet, TAKE 1 TABLET BY MOUTH EVERY DAY, Disp: 90 tablet, Rfl: 3   triamcinolone cream (KENALOG) 0.1 %, Apply 1 application topically 2 (two) times daily. Apply, Disp: 45 g, Rfl: 1   famotidine (HM FAMOTIDINE) 10 MG tablet, Take 1 tablet (10 mg total) by mouth daily., Disp: 90 tablet, Rfl: 1   olmesartan (BENICAR) 40 MG tablet, Take 1 tablet (40 mg total) by mouth daily., Disp: 90 tablet, Rfl: 2   No  Known Allergies   Review of Systems  Constitutional: Negative.   Eyes:  Negative for blurred vision.  Respiratory: Negative.  Negative for shortness of breath.   Cardiovascular: Negative.  Negative for chest pain and palpitations.  Neurological: Negative.   Psychiatric/Behavioral: Negative.       Today's Vitals   11/20/22 1434  BP: 128/82  Pulse: 75  Temp: 97.8 F (36.6 C)  SpO2: 98%  Weight: 234 lb 6.4 oz (106.3 kg)  Height: 5\' 1"  (1.549 m)   Body mass index is 44.29 kg/m.  Wt Readings from Last 3 Encounters:  11/20/22 234 lb 6.4 oz (106.3 kg)  08/11/22 227 lb (103 kg)  08/07/22 248 lb 9.6 oz (112.8 kg)     Objective:  Physical Exam Vitals and nursing note reviewed.  Constitutional:  Appearance: Normal appearance. She is obese.  HENT:     Head: Normocephalic and atraumatic.     Right Ear: Ear canal and external ear normal. There is no impacted cerumen.     Left Ear: Tympanic membrane, ear canal and external ear normal. There is no impacted cerumen.     Ears:     Comments: Excess cerumen in right ear Eyes:     Extraocular Movements: Extraocular movements intact.  Cardiovascular:     Rate and Rhythm: Normal rate and regular rhythm.     Heart sounds: Normal heart sounds.  Pulmonary:     Effort: Pulmonary effort is normal.     Breath sounds: Normal breath sounds.  Musculoskeletal:     Cervical back: Normal range of motion.  Skin:    General: Skin is warm.  Neurological:     General: No focal deficit present.     Mental Status: She is alert.  Psychiatric:        Mood and Affect: Mood normal.        Behavior: Behavior normal.      Assessment And Plan:  Type 2 diabetes mellitus with stage 3b chronic kidney disease, with long-term current use of insulin (HCC) Assessment & Plan: Chronic, she is reminded to avoid NSAIDs, stay hydrated and keep BP/BS controlled to decrease risk of CKD progression. Nephrology input is appreciated. She will continue with Tresiba  16 units nightly and semaglutide 1mg  weekly. She will f/u in four months for re-evaluation.   Orders: -     Lipid panel -     Hemoglobin A1c  Parenchymal renal hypertension, stage 1 through stage 4 or unspecified chronic kidney disease Assessment & Plan: Chronic, fair control. Goal BP<120/80. No med changes today. She will continue with amlodipine 10mg  daily, propranolol ER 120mg  daily, olmesartan 40mg  daily and chlorthalidone 25mg  daily. Encouraged to follow low sodium diet.    Mixed hyperlipidemia Assessment & Plan: Chronic, LDL goal is less than 70.  She will continue with atorvastatin 40mg  daily. Encouraged to follow heart healthy diet.    Discomfort of right ear Assessment & Plan: Moderate amount of wax noted on exam. She does not wish to have ear flushed. She will try OTC Debrox gtt.    Class 3 severe obesity due to excess calories with serious comorbidity and body mass index (BMI) of 40.0 to 44.9 in adult [facility]) Assessment & Plan: BMI 44. She is aware of 7lb weight gain since July. Encouraged to perform chair exercises daily during commercials while watching TV.    Immunization due -     Flu Vaccine Trivalent High Dose (Fluad)  She is encouraged to strive for BMI less than 30 to decrease cardiac risk. Advised to aim for at least 150 minutes of exercise per week.    Return if symptoms worsen or fail to improve.  Patient was given opportunity to ask questions. Patient verbalized understanding of the plan and was able to repeat key elements of the plan. All questions were answered to their satisfaction.    I, [name], MD, have reviewed all documentation for this visit. The documentation on 11/20/22 for the exam, diagnosis, procedures, and orders are all accurate and complete.   IF YOU HAVE BEEN REFERRED TO A SPECIALIST, IT MAY TAKE 1-2 WEEKS TO SCHEDULE/PROCESS THE REFERRAL. IF YOU HAVE NOT HEARD FROM US/SPECIALIST IN TWO WEEKS, PLEASE GIVE [place] A CALL AT [phone] X  [phone].   THE PATIENT IS ENCOURAGED TO PRACTICE SOCIAL  DISTANCING DUE TO THE COVID-19 PANDEMIC.

## 2022-11-20 NOTE — Patient Instructions (Signed)

## 2022-11-21 LAB — LIPID PANEL
Chol/HDL Ratio: 2.6 {ratio} (ref 0.0–4.4)
Cholesterol, Total: 164 mg/dL (ref 100–199)
HDL: 63 mg/dL (ref >39)
LDL Chol Calc (NIH): 84 mg/dL (ref 0–99)
Triglycerides: 91 mg/dL (ref 0–149)
VLDL Cholesterol Cal: 17 mg/dL (ref 5–40)

## 2022-11-21 LAB — HEMOGLOBIN A1C
Est. average glucose Bld gHb Est-mCnc: 140 mg/dL
Hgb A1c MFr Bld: 6.5 % — ABNORMAL HIGH (ref 4.8–5.6)

## 2022-11-30 DIAGNOSIS — H9201 Otalgia, right ear: Secondary | ICD-10-CM | POA: Insufficient documentation

## 2022-11-30 NOTE — Assessment & Plan Note (Signed)
BMI 44. She is aware of 7lb weight gain since July. Encouraged to perform chair exercises daily during commercials while watching TV.

## 2022-11-30 NOTE — Assessment & Plan Note (Signed)
Chronic, she is reminded to avoid NSAIDs, stay hydrated and keep BP/BS controlled to decrease risk of CKD progression. Nephrology input is appreciated. She will continue with Tresiba 16 units nightly and semaglutide 1mg  weekly. She will f/u in four months for re-evaluation.

## 2022-11-30 NOTE — Assessment & Plan Note (Signed)
Moderate amount of wax noted on exam. She does not wish to have ear flushed. She will try OTC Debrox gtt.

## 2022-11-30 NOTE — Assessment & Plan Note (Signed)
Chronic, LDL goal is less than 70.  She will continue with atorvastatin 40mg  daily. Encouraged to follow heart healthy diet.

## 2022-11-30 NOTE — Assessment & Plan Note (Signed)
Chronic, fair control. Goal BP<120/80. No med changes today. She will continue with amlodipine 10mg  daily, propranolol ER 120mg  daily, olmesartan 40mg  daily and chlorthalidone 25mg  daily. Encouraged to follow low sodium diet.

## 2022-12-22 DIAGNOSIS — N1832 Chronic kidney disease, stage 3b: Secondary | ICD-10-CM | POA: Diagnosis not present

## 2022-12-30 DIAGNOSIS — N2581 Secondary hyperparathyroidism of renal origin: Secondary | ICD-10-CM | POA: Diagnosis not present

## 2022-12-30 DIAGNOSIS — I129 Hypertensive chronic kidney disease with stage 1 through stage 4 chronic kidney disease, or unspecified chronic kidney disease: Secondary | ICD-10-CM | POA: Diagnosis not present

## 2022-12-30 DIAGNOSIS — D631 Anemia in chronic kidney disease: Secondary | ICD-10-CM | POA: Diagnosis not present

## 2022-12-30 DIAGNOSIS — N184 Chronic kidney disease, stage 4 (severe): Secondary | ICD-10-CM | POA: Diagnosis not present

## 2022-12-30 DIAGNOSIS — E875 Hyperkalemia: Secondary | ICD-10-CM | POA: Diagnosis not present

## 2022-12-30 DIAGNOSIS — E1122 Type 2 diabetes mellitus with diabetic chronic kidney disease: Secondary | ICD-10-CM | POA: Diagnosis not present

## 2022-12-30 DIAGNOSIS — R809 Proteinuria, unspecified: Secondary | ICD-10-CM | POA: Diagnosis not present

## 2023-02-05 ENCOUNTER — Telehealth: Payer: Self-pay

## 2023-02-05 NOTE — Telephone Encounter (Signed)
PAP: Application for Ozempic Teresa Stanley has been submitted to PAP Companies: [employer], Oceanographer portion of application has been faxed to Dr. Dorothyann Peng for review

## 2023-02-16 ENCOUNTER — Other Ambulatory Visit: Payer: Self-pay | Admitting: Pharmacist

## 2023-02-16 DIAGNOSIS — N1832 Chronic kidney disease, stage 3b: Secondary | ICD-10-CM

## 2023-02-16 MED ORDER — DAPAGLIFLOZIN PROPANEDIOL 10 MG PO TABS: 10.0000 mg | ORAL_TABLET | Freq: Every day | ORAL | 3 refills | Status: AC

## 2023-02-16 NOTE — Progress Notes (Signed)
 Care Coordination Call  Received fax that updated prescription for Farxiga  is needed for patient to [employer] patient assistance program. Reviewed recent Nephrology records, patient is still to be taking Farxiga . Order sent.   Catie [facility], PharmD, BCACP, CPP Clinical Pharmacist [facility] [phone]

## 2023-02-24 NOTE — Telephone Encounter (Signed)
 Received the provider portion of the PAP re-enrollment application.  Faxed complete provider portion along with insurance card to [employer].

## 2023-02-26 NOTE — Telephone Encounter (Signed)
PAP: Patient assistance application Ozempic and Evaristo Bury for has been approved by PAP Companies: [employer] from 02/25/2023 to 02/10/2024. Medication should be delivered to PAP Delivery: Provider's office For further shipping updates, please contact [employer] at [phone] Pt ID is: [id number]

## 2023-02-27 NOTE — Progress Notes (Signed)
Pharmacy Medication Assistance Program Note    02/27/2023  Patient ID: Teresa Stanley, female   DOB: 1945-05-06, 78 y.o.   MRN: [id number]     02/05/2023  Outreach Medication One  Manufacturer Medication One [employer] Drugs Ozempic  Dose of Ozempic 1mg /week  Type of Radiographer, therapeutic Assistance  Date Application Sent to Prescriber 02/05/2023  Name of Prescriber robyn sanders  Date Application Received From Provider 02/24/2023  Date Application Submitted to Manufacturer 02/24/2023  Method Application Sent to Manufacturer Fax  Patient Assistance Determination Approved  Approval Start Date 02/25/2023  Approval End Date 02/10/2024  Patient Notification Method MyChart     Signature [name], CPHT/Patient [place] Direct Line: [phone] Fax: [phone]

## 2023-03-12 ENCOUNTER — Other Ambulatory Visit: Payer: Self-pay | Admitting: Internal Medicine

## 2023-03-18 ENCOUNTER — Ambulatory Visit (INDEPENDENT_AMBULATORY_CARE_PROVIDER_SITE_OTHER): Payer: Medicare Other | Admitting: Internal Medicine

## 2023-03-18 ENCOUNTER — Encounter: Payer: Self-pay | Admitting: Internal Medicine

## 2023-03-18 VITALS — BP 124/78 | HR 87 | Temp 98.6°F | Ht 61.0 in | Wt 234.6 lb

## 2023-03-18 DIAGNOSIS — J069 Acute upper respiratory infection, unspecified: Secondary | ICD-10-CM | POA: Diagnosis not present

## 2023-03-18 DIAGNOSIS — Z794 Long term (current) use of insulin: Secondary | ICD-10-CM | POA: Diagnosis not present

## 2023-03-18 DIAGNOSIS — E1122 Type 2 diabetes mellitus with diabetic chronic kidney disease: Secondary | ICD-10-CM | POA: Diagnosis not present

## 2023-03-18 DIAGNOSIS — N1832 Chronic kidney disease, stage 3b: Secondary | ICD-10-CM

## 2023-03-18 DIAGNOSIS — I129 Hypertensive chronic kidney disease with stage 1 through stage 4 chronic kidney disease, or unspecified chronic kidney disease: Secondary | ICD-10-CM | POA: Diagnosis not present

## 2023-03-18 DIAGNOSIS — Z6841 Body Mass Index (BMI) 40.0 and over, adult: Secondary | ICD-10-CM

## 2023-03-18 DIAGNOSIS — E66813 Obesity, class 3: Secondary | ICD-10-CM

## 2023-03-18 NOTE — Progress Notes (Signed)
I,[name], CMA,acting as a Neurosurgeon for [name], MD.,have documented all relevant documentation on the behalf of [name], MD,as directed by  [name], MD while in the presence of [name], MD.  Subjective:  Patient ID: Teresa Stanley , female    DOB: 02-06-1946 , 78 y.o.   MRN: [id number]  Chief Complaint  Patient presents with   URI    HPI  Patient presents today for cold like symptoms. Teresa Stanley states Teresa Stanley started having chills on Monday.  On Monday, Teresa Stanley developed body aches, mild cough, and runny nose. Teresa Stanley has taken Tylenol without relief of her meds. Teresa Stanley denies known ill contacts.    URI  This is a new problem. The current episode started in the past 7 days. There has been no fever. Associated symptoms include congestion and coughing. Pertinent negatives include no headaches. Teresa Stanley has tried acetaminophen for the symptoms. The treatment provided mild relief.  Diabetes Teresa Stanley presents for her follow-up diabetic visit. Teresa Stanley has type 2 diabetes mellitus. Her disease course has been stable. There are no hypoglycemic associated symptoms. Pertinent negatives for hypoglycemia include no headaches. Associated symptoms include fatigue. Pertinent negatives for diabetes include no blurred vision. There are no hypoglycemic complications. Diabetic complications include nephropathy. Risk factors for coronary artery disease include diabetes mellitus, dyslipidemia, obesity, post-menopausal and sedentary lifestyle. Teresa Stanley is compliant with treatment some of the time. Her weight is decreasing steadily. Teresa Stanley is following a generally healthy diet. Teresa Stanley participates in exercise intermittently. Her breakfast blood glucose is taken between 8-9 am. Her breakfast blood glucose range is generally 130-140 mg/dl. An ACE inhibitor/angiotensin II receptor blocker is being taken. Eye exam is current.  Hypertension The current episode started more than 1 year ago. The problem has been gradually improving  since onset. The problem is uncontrolled. Pertinent negatives include no blurred vision, headaches or palpitations. The current treatment provides moderate improvement. Compliance problems include exercise.  Hypertensive end-organ damage includes kidney disease.     Past Medical History:  Diagnosis Date   Arthritis    Diabetes mellitus    takes Actos and Metformin daily and Victoza   GERD (gastroesophageal reflux disease)    takes Omeprazole daily   Headache(784.0)    occasionally   Hyperlipidemia    takes Atorvastatin daily   Hypertension    takes Lisinopril,Amlodipine,and Metoprolol daily   Hypothyroidism    Joint pain    Thyroid disease    ?     Family History  Problem Relation Age of Onset   Diabetes Mother    Hypertension Mother    Diabetes Father    Asthma Father    Hypertension Father    Breast cancer Sister 71   Hypertension Sister        2   Hypertension Sister    Diabetes Sister    Hypertension Brother        5   Kidney disease Brother    Hypertension Daughter    Colon cancer Neg Hx    Esophageal cancer Neg Hx    Rectal cancer Neg Hx    Stomach cancer Neg Hx      Current Outpatient Medications:    amLODipine (NORVASC) 10 MG tablet, TAKE 1 TABLET BY MOUTH EVERY DAY, Disp: 90 tablet, Rfl: 2   aspirin 81 MG tablet, Take 81 mg by mouth daily., Disp: , Rfl:    atorvastatin (LIPITOR) 40 MG tablet, TAKE 1 TABLET BY MOUTH EVERY DAY, Disp: 90  tablet, Rfl: 2   BD PEN NEEDLE NANO 2ND GEN 32G X 4 MM MISC, USE WITH TRESIBA, Disp: 100 each, Rfl: 10   Blood Glucose Calibration (OT ULTRA/FASTTK CNTRL SOLN) SOLN, , Disp: , Rfl:    Blood Glucose Monitoring Suppl (ONE TOUCH ULTRA 2) w/Device KIT, Use as directed to check blood sugars 2 times per day dx:e11.22, Disp: 1 each, Rfl: 1   carboxymethylcellulose (REFRESH PLUS) 0.5 % SOLN, Place 1 drop into both eyes daily., Disp: , Rfl:    chlorthalidone (HYGROTON) 25 MG tablet, Take 25 mg by mouth daily., Disp: , Rfl:     cholecalciferol (VITAMIN D3) 25 MCG (1000 UNIT) tablet, Take 1,000 Units by mouth daily., Disp: , Rfl:    dapagliflozin propanediol (FARXIGA) 10 MG TABS tablet, Take 1 tablet (10 mg total) by mouth daily before breakfast., Disp: 90 tablet, Rfl: 3   gabapentin (NEURONTIN) 300 MG capsule, TAKE 1 CAPSULE BY MOUTH EVERYDAY AT BEDTIME, Disp: 90 capsule, Rfl: 2   hydrALAZINE (APRESOLINE) 25 MG tablet, TAKE 1 TABLET BY MOUTH TWICE A DAY, Disp: 60 tablet, Rfl: 1   insulin degludec (TRESIBA FLEXTOUCH) 100 UNIT/ML SOPN FlexTouch Pen, Inject 16 Units into the skin at bedtime. , Disp: , Rfl:    meclizine (ANTIVERT) 25 MG tablet, TAKE 1 TABLET (25 MG TOTAL) BY MOUTH 3 (THREE) TIMES DAILY AS NEEDED FOR DIZZINESS., Disp: 30 tablet, Rfl: 0   methimazole (TAPAZOLE) 5 MG tablet, TAKE 1 TABLET (5 MG TOTAL) BY MOUTH DAILY., Disp: 90 tablet, Rfl: 1   OneTouch Delica Lancets 33G MISC, Use as directed to check blood sugars 2 times per day dx: e11.22, Disp: 300 each, Rfl: 2   ONETOUCH ULTRA test strip, USE AS INSTRUCTED TO CHECK BLOOD SUGARS 2 TIMES PER DAY DX: E11.22, Disp: 200 strip, Rfl: 4   propranolol ER (INDERAL LA) 120 MG 24 hr capsule, TAKE 1 CAPSULE BY MOUTH EVERY DAY, Disp: 90 capsule, Rfl: 2   Semaglutide, 1 MG/DOSE, 4 MG/3ML SOPN, Inject 1 mg into the skin once a week., Disp: 3 mL, Rfl: 2   sodium bicarbonate 650 MG tablet, TAKE 1 TABLET BY MOUTH EVERY DAY, Disp: 90 tablet, Rfl: 3   triamcinolone cream (KENALOG) 0.1 %, Apply 1 application topically 2 (two) times daily. Apply, Disp: 45 g, Rfl: 1   famotidine (HM FAMOTIDINE) 10 MG tablet, Take 1 tablet (10 mg total) by mouth daily., Disp: 90 tablet, Rfl: 1   olmesartan (BENICAR) 40 MG tablet, Take 1 tablet (40 mg total) by mouth daily., Disp: 90 tablet, Rfl: 2   oxyCODONE-acetaminophen (PERCOCET) 5-325 MG tablet, Take 1 tablet by mouth every 4 (four) hours as needed for moderate pain (pain score 4-6)., Disp: 20 tablet, Rfl: 0   tiZANidine (ZANAFLEX) 4 MG tablet,  Take 1 tablet (4 mg total) by mouth every 6 (six) hours as needed for muscle spasms., Disp: 40 tablet, Rfl: 0   No Known Allergies   Review of Systems  Constitutional:  Positive for fatigue. Negative for chills.  HENT:  Positive for congestion.   Eyes:  Negative for blurred vision.  Respiratory:  Positive for cough.   Cardiovascular: Negative.  Negative for palpitations.  Neurological: Negative.  Negative for headaches.  Psychiatric/Behavioral: Negative.       Today's Vitals   03/18/23 1141 03/18/23 1216  BP: (!) 160/80 124/78  Pulse: 87   Temp: 98.6 F (37 C)   SpO2: 98%   Weight: 234 lb 9.6 oz (106.4 kg)  Height: 5\' 1"  (1.549 m)    Body mass index is 44.33 kg/m.  Wt Readings from Last 3 Encounters:  03/19/23 236 lb (107 kg)  03/18/23 234 lb 9.6 oz (106.4 kg)  11/20/22 234 lb 6.4 oz (106.3 kg)     Objective:  Physical Exam Vitals and nursing note reviewed.  Constitutional:      Appearance: Teresa Stanley is obese. Teresa Stanley is ill-appearing.  HENT:     Head: Normocephalic and atraumatic.     Right Ear: Tympanic membrane, ear canal and external ear normal. There is no impacted cerumen.     Left Ear: Tympanic membrane, ear canal and external ear normal. There is no impacted cerumen.  Eyes:     Extraocular Movements: Extraocular movements intact.  Cardiovascular:     Rate and Rhythm: Normal rate and regular rhythm.     Heart sounds: Normal heart sounds.  Pulmonary:     Effort: Pulmonary effort is normal.     Breath sounds: Normal breath sounds.  Musculoskeletal:     Cervical back: Normal range of motion.  Skin:    General: Skin is warm.  Neurological:     General: No focal deficit present.     Mental Status: Teresa Stanley is alert.  Psychiatric:        Mood and Affect: Mood normal.        Behavior: Behavior normal.         Assessment And Plan:  Viral upper respiratory tract infection Assessment & Plan: COVID/Flu a/b are negative. Teresa Stanley was given samples of Coricidin to take as  needed. Advised to avoid dairy and to stay well hydrated. Teresa Stanley will let me know if her symptoms persist.   Orders: -     POC SOFIA 2 FLU + SARS ANTIGEN FIA  Type 2 diabetes mellitus with stage 3b chronic kidney disease, with long-term current use of insulin (HCC) Assessment & Plan: Chronic, Teresa Stanley is reminded to avoid NSAIDs, stay hydrated and keep BP/BS controlled to decrease risk of CKD progression. Nephrology input is appreciated. Regarding diabetes, Teresa Stanley will continue with Tresiba 16 units nightly and semaglutide 1mg  weekly. Teresa Stanley will f/u in four months for re-evaluation.   Orders: -     CMP14+EGFR -     Hemoglobin A1c  Parenchymal renal hypertension, stage 1 through stage 4 or unspecified chronic kidney disease Assessment & Plan: Chronic, fair control. Goal BP<120/80. No med changes today. Teresa Stanley will continue with amlodipine 10mg  daily, propranolol ER 120mg  daily, olmesartan 40mg  daily and chlorthalidone 25mg  daily. Encouraged to follow low sodium diet.   Orders: -     CMP14+EGFR  Class 3 severe obesity due to excess calories with serious comorbidity and body mass index (BMI) of 40.0 to 44.9 in adult [facility]) Assessment & Plan: BMI 44.  Teresa Stanley is encouraged to initially strive for BMI less than 35 to decrease cardiac risk. Advised to aim for at least 150 minutes of exercise per week.    Teresa Stanley is encouraged to strive for BMI less than 30 to decrease cardiac risk. Advised to aim for at least 150 minutes of exercise per week.    Return cancel RS March visit, keep AWV (make it phone).  Patient was given opportunity to ask questions. Patient verbalized understanding of the plan and was able to repeat key elements of the plan. All questions were answered to their satisfaction.    I, [name], MD, have reviewed all documentation for this visit. The documentation on 03/18/23 for the exam, diagnosis,  procedures, and orders are all accurate and complete.   IF YOU HAVE BEEN REFERRED TO A  SPECIALIST, IT MAY TAKE 1-2 WEEKS TO SCHEDULE/PROCESS THE REFERRAL. IF YOU HAVE NOT HEARD FROM US/SPECIALIST IN TWO WEEKS, PLEASE GIVE [place] A CALL AT [phone] X [phone].   THE PATIENT IS ENCOURAGED TO PRACTICE SOCIAL DISTANCING DUE TO THE COVID-19 PANDEMIC.

## 2023-03-19 ENCOUNTER — Encounter (HOSPITAL_COMMUNITY): Payer: Self-pay

## 2023-03-19 ENCOUNTER — Other Ambulatory Visit: Payer: Self-pay

## 2023-03-19 ENCOUNTER — Emergency Department (HOSPITAL_COMMUNITY)
Admission: EM | Admit: 2023-03-19 | Discharge: 2023-03-20 | Disposition: A | Discharge status: Home / Self Care | Payer: Medicare Other | Attending: Emergency Medicine | Admitting: Emergency Medicine

## 2023-03-19 DIAGNOSIS — Z7982 Long term (current) use of aspirin: Secondary | ICD-10-CM | POA: Insufficient documentation

## 2023-03-19 DIAGNOSIS — Z7984 Long term (current) use of oral hypoglycemic drugs: Secondary | ICD-10-CM | POA: Insufficient documentation

## 2023-03-19 DIAGNOSIS — I10 Essential (primary) hypertension: Secondary | ICD-10-CM | POA: Diagnosis not present

## 2023-03-19 DIAGNOSIS — M5441 Lumbago with sciatica, right side: Secondary | ICD-10-CM | POA: Diagnosis not present

## 2023-03-19 DIAGNOSIS — Z794 Long term (current) use of insulin: Secondary | ICD-10-CM | POA: Insufficient documentation

## 2023-03-19 DIAGNOSIS — Z79899 Other long term (current) drug therapy: Secondary | ICD-10-CM | POA: Diagnosis not present

## 2023-03-19 DIAGNOSIS — M5442 Lumbago with sciatica, left side: Secondary | ICD-10-CM | POA: Insufficient documentation

## 2023-03-19 DIAGNOSIS — M62838 Other muscle spasm: Secondary | ICD-10-CM | POA: Insufficient documentation

## 2023-03-19 DIAGNOSIS — E119 Type 2 diabetes mellitus without complications: Secondary | ICD-10-CM | POA: Diagnosis not present

## 2023-03-19 DIAGNOSIS — M545 Low back pain, unspecified: Secondary | ICD-10-CM | POA: Diagnosis present

## 2023-03-19 LAB — CBC WITH DIFFERENTIAL/PLATELET
Abs Immature Granulocytes: 0.03 10*3/uL (ref 0.00–0.07)
Basophils Absolute: 0 10*3/uL (ref 0.0–0.1)
Basophils Relative: 0 %
Eosinophils Absolute: 0.1 10*3/uL (ref 0.0–0.5)
Eosinophils Relative: 2 %
HCT: 42.1 % (ref 36.0–46.0)
Hemoglobin: 12.8 g/dL (ref 12.0–15.0)
Immature Granulocytes: 0 %
Lymphocytes Relative: 16 %
Lymphs Abs: 1.5 10*3/uL (ref 0.7–4.0)
MCH: 29.4 pg (ref 26.0–34.0)
MCHC: 30.4 g/dL (ref 30.0–36.0)
MCV: 96.6 fL (ref 80.0–100.0)
Monocytes Absolute: 0.7 10*3/uL (ref 0.1–1.0)
Monocytes Relative: 8 %
Neutro Abs: 6.9 10*3/uL (ref 1.7–7.7)
Neutrophils Relative %: 74 %
Platelets: 257 10*3/uL (ref 150–400)
RBC: 4.36 MIL/uL (ref 3.87–5.11)
RDW: 15 % (ref 11.5–15.5)
WBC: 9.3 10*3/uL (ref 4.0–10.5)
nRBC: 0 % (ref 0.0–0.2)

## 2023-03-19 LAB — CMP14+EGFR
ALT: 10 [IU]/L (ref 0–32)
AST: 15 [IU]/L (ref 0–40)
Albumin: 4.1 g/dL (ref 3.8–4.8)
Alkaline Phosphatase: 137 [IU]/L — ABNORMAL HIGH (ref 44–121)
BUN/Creatinine Ratio: 17 (ref 12–28)
BUN: 32 mg/dL — ABNORMAL HIGH (ref 8–27)
Bilirubin Total: 0.5 mg/dL (ref 0.0–1.2)
CO2: 24 mmol/L (ref 20–29)
Calcium: 9.3 mg/dL (ref 8.7–10.3)
Chloride: 104 mmol/L (ref 96–106)
Creatinine, Ser: 1.89 mg/dL — ABNORMAL HIGH (ref 0.57–1.00)
Globulin, Total: 2.4 g/dL (ref 1.5–4.5)
Glucose: 165 mg/dL — ABNORMAL HIGH (ref 70–99)
Potassium: 5.1 mmol/L (ref 3.5–5.2)
Sodium: 144 mmol/L (ref 134–144)
Total Protein: 6.5 g/dL (ref 6.0–8.5)
eGFR: 27 mL/min/{1.73_m2} — ABNORMAL LOW (ref >59)

## 2023-03-19 LAB — HEMOGLOBIN A1C
Est. average glucose Bld gHb Est-mCnc: 131 mg/dL
Hgb A1c MFr Bld: 6.2 % — ABNORMAL HIGH (ref 4.8–5.6)

## 2023-03-19 NOTE — ED Triage Notes (Signed)
 Pt arrived POV with son with c/o muscle spasms that radiates down the both of her legs x4 days, increasingly has gotten worse,and more difficulty walking. Seen her PCP on yesterday COVID and Flu swab but (-). Pt endorses neuropathy and takes gabapentin , increased difficulty walking d/t

## 2023-03-20 LAB — COMPREHENSIVE METABOLIC PANEL
ALT: 14 U/L (ref 0–44)
AST: 19 U/L (ref 15–41)
Albumin: 4.2 g/dL (ref 3.5–5.0)
Alkaline Phosphatase: 109 U/L (ref 38–126)
Anion gap: 10 (ref 5–15)
BUN: 34 mg/dL — ABNORMAL HIGH (ref 8–23)
CO2: 27 mmol/L (ref 22–32)
Calcium: 9.4 mg/dL (ref 8.9–10.3)
Chloride: 101 mmol/L (ref 98–111)
Creatinine, Ser: 1.84 mg/dL — ABNORMAL HIGH (ref 0.44–1.00)
GFR, Estimated: 28 mL/min — ABNORMAL LOW (ref >60)
Glucose, Bld: 137 mg/dL — ABNORMAL HIGH (ref 70–99)
Potassium: 4.1 mmol/L (ref 3.5–5.1)
Sodium: 138 mmol/L (ref 135–145)
Total Bilirubin: 0.8 mg/dL (ref 0.0–1.2)
Total Protein: 7.8 g/dL (ref 6.5–8.1)

## 2023-03-20 MED ORDER — OXYCODONE-ACETAMINOPHEN 5-325 MG PO TABS: 1.0000 | ORAL_TABLET | ORAL | 0 refills | Status: DC | PRN

## 2023-03-20 MED ORDER — TIZANIDINE HCL 4 MG PO TABS
4.0000 mg | ORAL_TABLET | Freq: Once | ORAL | Status: AC
  Administered 2023-03-20: 4 mg via ORAL
  Filled 2023-03-20: qty 1

## 2023-03-20 MED ORDER — TIZANIDINE HCL 4 MG PO TABS: 4.0000 mg | ORAL_TABLET | Freq: Four times a day (QID) | ORAL | 0 refills | Status: DC | PRN

## 2023-03-20 MED ORDER — OXYCODONE-ACETAMINOPHEN 5-325 MG PO TABS
1.0000 | ORAL_TABLET | Freq: Once | ORAL | Status: AC
  Administered 2023-03-20: 1 via ORAL
  Filled 2023-03-20: qty 1

## 2023-03-20 NOTE — ED Provider Notes (Signed)
 [place] EMERGENCY DEPARTMENT AT [facility] Provider Note   CSN: [id number] Arrival date & time: 03/19/23  2202     History  Chief Complaint  Patient presents with   Spasms    Teresa Stanley is a 78 y.o. female.  The history is provided by the patient.  She has history of hypertension, diabetes, hyperlipidemia and comes in because of low back pain and muscle spasm in her thighs.  She started having low back pain 4 days ago.  Pain radiates to the posterior aspect of both thighs and goes down to about mid calf.  She has noted some muscle spasm in her thighs.  She denies any muscle spasm in her back.  She denies any change in her usual numbness in her feet from neuropathy.  She denies any bowel or bladder dysfunction.  She has not noticed any weakness.  She has taken acetaminophen  without relief.    Home Medications Prior to Admission medications   Medication Sig Start Date End Date Taking? Authorizing Provider  amLODipine  (NORVASC ) 10 MG tablet TAKE 1 TABLET BY MOUTH EVERY DAY 09/09/22   Jarold Medici, MD  aspirin  81 MG tablet Take 81 mg by mouth daily.    [provider]  atorvastatin  (LIPITOR) 40 MG tablet TAKE 1 TABLET BY MOUTH EVERY DAY 03/12/23   Jarold Medici, MD  BD PEN NEEDLE NANO 2ND GEN 32G X 4 MM MISC USE WITH TRESIBA 07/01/21   Jarold Medici, MD  Blood Glucose Calibration (OT ULTRA/FASTTK CNTRL SOLN) SOLN  01/08/21   [provider]  Blood Glucose Monitoring Suppl (ONE TOUCH ULTRA 2) w/Device KIT Use as directed to check blood sugars 2 times per day dx:e11.22 06/08/18   Jarold Medici, MD  carboxymethylcellulose (REFRESH PLUS) 0.5 % SOLN Place 1 drop into both eyes daily.    [provider]  chlorthalidone (HYGROTON) 25 MG tablet Take 25 mg by mouth daily. 10/11/19   [provider]  cholecalciferol (VITAMIN D3) 25 MCG (1000 UNIT) tablet Take 1,000 Units by mouth daily.    [provider]  dapagliflozin  propanediol  (FARXIGA ) 10 MG TABS tablet Take 1 tablet (10 mg total) by mouth daily before breakfast. 02/16/23   Jarold Medici, MD  famotidine  (HM FAMOTIDINE ) 10 MG tablet Take 1 tablet (10 mg total) by mouth daily. 03/12/22 09/08/22  Jarold Medici, MD  gabapentin  (NEURONTIN ) 300 MG capsule TAKE 1 CAPSULE BY MOUTH EVERYDAY AT BEDTIME 10/27/22   Jarold Medici, MD  hydrALAZINE  (APRESOLINE ) 25 MG tablet TAKE 1 TABLET BY MOUTH TWICE A DAY 11/23/20   Jarold Medici, MD  insulin  degludec (TRESIBA FLEXTOUCH) 100 UNIT/ML SOPN FlexTouch Pen Inject 16 Units into the skin at bedtime.     [provider]  meclizine  (ANTIVERT ) 25 MG tablet TAKE 1 TABLET (25 MG TOTAL) BY MOUTH 3 (THREE) TIMES DAILY AS NEEDED FOR DIZZINESS. 02/22/20   Jarold Medici, MD  methimazole  (TAPAZOLE ) 5 MG tablet TAKE 1 TABLET (5 MG TOTAL) BY MOUTH DAILY. 10/27/22   Jarold Medici, MD  olmesartan  (BENICAR ) 40 MG tablet Take 1 tablet (40 mg total) by mouth daily. 10/09/20 08/11/22  Jarold Medici, MD  OneTouch Delica Lancets 33G MISC Use as directed to check blood sugars 2 times per day dx: e11.22 06/25/20   Jarold Medici, MD  [facility] ULTRA test strip USE AS INSTRUCTED TO CHECK BLOOD SUGARS 2 TIMES PER DAY DX: E11.22 02/11/21   Jarold Medici, MD  propranolol  ER (INDERAL  LA) 120 MG 24 hr  capsule TAKE 1 CAPSULE BY MOUTH EVERY DAY 07/18/22   Jarold Medici, MD  Semaglutide , 1 MG/DOSE, 4 MG/3ML SOPN Inject 1 mg into the skin once a week. 06/18/20   Jarold Medici, MD  sodium bicarbonate 650 MG tablet TAKE 1 TABLET BY MOUTH EVERY DAY 09/09/22   Jarold Medici, MD  triamcinolone  cream (KENALOG ) 0.1 % Apply 1 application topically 2 (two) times daily. Apply 08/09/19   Jarold Medici, MD      Allergies    Patient has no known allergies.    Review of Systems   Review of Systems  All other systems reviewed and are negative.   Physical Exam Updated Vital Signs BP (!) 141/81   Pulse 72   Temp 97.7 F (36.5 C) (Oral)   Resp 16   Ht 5' 1 (1.549 m)   Wt  107 kg   SpO2 100%   BMI 44.59 kg/m  Physical Exam Vitals and nursing note reviewed.   78 year old female, resting comfortably and in no acute distress. Vital signs are significant for borderline elevated blood pressure. Oxygen saturation is 100%, which is normal. Head is normocephalic and atraumatic. PERRLA, EOMI.  Back is mildly tender over the lower lumbar spine.  There is mild to moderate bilateral paralumbar spasm.  Straight leg raise is positive at 45 degrees bilaterally. Lungs are clear without rales, wheezes, or rhonchi. Chest is nontender. Heart has regular rate and rhythm without murmur. Abdomen is soft, flat, nontender. Extremities have 2+ edema, full range of motion is present. Skin is warm and dry without rash. Neurologic: Mental status is normal, cranial nerves are intact, strength is 5/5 in all 4 extremities, sensation is symmetric but she does have decreased sensation in her feet with normal sensation beginning in the distal lower leg bilaterally.  ED Results / Procedures / Treatments   Labs (all labs ordered are listed, but only abnormal results are displayed) Labs Reviewed  COMPREHENSIVE METABOLIC PANEL - Abnormal; Notable for the following components:      Result Value   Glucose, Bld 137 (*)    BUN 34 (*)    Creatinine, Ser 1.84 (*)    GFR, Estimated 28 (*)    All other components within normal limits  CBC WITH DIFFERENTIAL/PLATELET   Procedures Procedures    Medications Ordered in ED Medications - No data to display  ED Course/ Medical Decision Making/ A&P                                 Medical Decision Making Amount and/or Complexity of Data Reviewed Labs: ordered.  Risk Prescription drug management.   Low back pain with bilateral sciatica.  No red flags to suggest more serious pathology such as discitis, epidural abscess, cauda equina syndrome.  I have reviewed her past records, and she had CT abdomen and pelvis on 01/22/2014 at which time  there was no evidence of an aortic aneurysm making aortic dissection much less likely.  At this point, I do not feel there are indications for imaging.  I have reviewed her laboratory tests, and my interpretation is stable renal insufficiency, normal CBC.  I have ordered a dose of tizanidine  and oxycodone -acetaminophen .  NSAIDs are contraindicated because of renal insufficiency.  She had marked improvement with above-noted treatment.  I am discharging her with prescription for tizanidine  and oxycodone -acetaminophen .  I have advised her to apply ice to her lower back, follow-up with  primary care provider.  Final Clinical Impression(s) / ED Diagnoses Final diagnoses:  Acute midline low back pain with bilateral sciatica    Rx / DC Orders ED Discharge Orders          Ordered    tiZANidine  (ZANAFLEX ) 4 MG tablet  Every 6 hours PRN        03/20/23 0452    oxyCODONE -acetaminophen  (PERCOCET) 5-325 MG tablet  Every 4 hours PRN        03/20/23 0452              Raford Lenis, MD 03/20/23 [phone]

## 2023-03-20 NOTE — Discharge Instructions (Addendum)
 Apply ice for 30 minutes at a time, 4 times a day.  For less severe pain, use plain acetaminophen  instead of oxycodone -acetaminophen .

## 2023-03-29 DIAGNOSIS — J069 Acute upper respiratory infection, unspecified: Secondary | ICD-10-CM | POA: Insufficient documentation

## 2023-03-29 NOTE — Assessment & Plan Note (Signed)
Chronic, fair control. Goal BP<120/80. No med changes today. She will continue with amlodipine 10mg  daily, propranolol ER 120mg  daily, olmesartan 40mg  daily and chlorthalidone 25mg  daily. Encouraged to follow low sodium diet.

## 2023-03-29 NOTE — Assessment & Plan Note (Signed)
Chronic, she is reminded to avoid NSAIDs, stay hydrated and keep BP/BS controlled to decrease risk of CKD progression. Nephrology input is appreciated. Regarding diabetes, she will continue with Tresiba 16 units nightly and semaglutide 1mg  weekly. She will f/u in four months for re-evaluation.

## 2023-03-29 NOTE — Assessment & Plan Note (Signed)
BMI 44.  She is encouraged to initially strive for BMI less than 35 to decrease cardiac risk. Advised to aim for at least 150 minutes of exercise per week.

## 2023-03-29 NOTE — Assessment & Plan Note (Signed)
COVID/Flu a/b are negative. She was given samples of Coricidin to take as needed. Advised to avoid dairy and to stay well hydrated. She will let me know if her symptoms persist.

## 2023-03-30 LAB — POC SOFIA 2 FLU + SARS ANTIGEN FIA
Influenza A, POC: NEGATIVE
Influenza B, POC: NEGATIVE
SARS Coronavirus 2 Ag: NEGATIVE

## 2023-03-30 NOTE — Addendum Note (Signed)
Addended by: Randa Lynn T on: 03/30/2023 10:09 AM   Modules accepted: Orders

## 2023-04-06 ENCOUNTER — Other Ambulatory Visit: Payer: Medicare Other

## 2023-04-07 ENCOUNTER — Encounter: Payer: Self-pay | Admitting: Internal Medicine

## 2023-04-08 ENCOUNTER — Ambulatory Visit (INDEPENDENT_AMBULATORY_CARE_PROVIDER_SITE_OTHER): Payer: Medicare Other | Admitting: Family Medicine

## 2023-04-08 ENCOUNTER — Encounter: Payer: Self-pay | Admitting: Family Medicine

## 2023-04-08 VITALS — BP 130/70 | HR 78 | Temp 98.4°F | Ht 61.0 in | Wt 229.0 lb

## 2023-04-08 DIAGNOSIS — Z6841 Body Mass Index (BMI) 40.0 and over, adult: Secondary | ICD-10-CM

## 2023-04-08 DIAGNOSIS — M5442 Lumbago with sciatica, left side: Secondary | ICD-10-CM | POA: Diagnosis not present

## 2023-04-08 DIAGNOSIS — M5441 Lumbago with sciatica, right side: Secondary | ICD-10-CM

## 2023-04-08 DIAGNOSIS — E66813 Obesity, class 3: Secondary | ICD-10-CM

## 2023-04-08 MED ORDER — TRAMADOL HCL 50 MG PO TABS
50.0000 mg | ORAL_TABLET | Freq: Four times a day (QID) | ORAL | 0 refills | Status: AC | PRN
Start: 2023-04-08 — End: 2023-04-13

## 2023-04-08 MED ORDER — TRIAMCINOLONE ACETONIDE 40 MG/ML IJ SUSP
60.0000 mg | Freq: Once | INTRAMUSCULAR | Status: AC
Start: 2023-04-08 — End: 2023-04-08
  Administered 2023-04-08: 60 mg via INTRAMUSCULAR

## 2023-04-08 MED ORDER — LIDOCAINE 5 % EX PTCH
1.0000 | MEDICATED_PATCH | CUTANEOUS | 2 refills | Status: AC
Start: 2023-04-08 — End: ?

## 2023-04-08 NOTE — Progress Notes (Addendum)
 Madelaine Bhat, CMA,acting as a Neurosurgeon for Ellender Hose, NP.,have documented all relevant documentation on the behalf of Ellender Hose, NP,as directed by  Ellender Hose, NP while in the presence of Ellender Hose, NP.  Subjective:  Patient ID: Teresa Stanley , female    DOB: 09-19-1945 , 78 y.o.   MRN: [id number]  No chief complaint on file.   HPI  Patient is a 78 year old female who  presents today for lower back pain that goes down her legs. Patient reports the back pain first started about 03/19/2023 and  reports that it  got better then started back again on Monday. Patient reports she was treated with oxycodone and muscle relaxer when she initially started hurting.  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 Her back is not tender to touch but hurts when she leans forward and backwards. Patient rates her pain 7/10 when she is up walking, she is here with her daughter.     Past Medical History:  Diagnosis Date   Arthritis    Diabetes mellitus    takes Actos and Metformin daily and Victoza   GERD (gastroesophageal reflux disease)    takes Omeprazole daily   Headache(784.0)    occasionally   Hyperlipidemia    takes Atorvastatin daily   Hypertension    takes Lisinopril,Amlodipine,and Metoprolol daily   Hypothyroidism    Joint pain    Thyroid disease    ?     Family History  Problem Relation Age of Onset   Diabetes Mother    Hypertension Mother    Diabetes Father    Asthma Father    Hypertension Father    Breast cancer Sister 49   Hypertension Sister        2   Hypertension Sister    Diabetes Sister    Hypertension Brother        5   Kidney disease Brother    Hypertension Daughter    Colon cancer Neg Hx    Esophageal cancer Neg Hx    Rectal cancer Neg Hx    Stomach cancer Neg Hx      Current Outpatient Medications:    aspirin 81 MG tablet, Take 81 mg by mouth daily., Disp: , Rfl:    atorvastatin (LIPITOR) 40 MG tablet, TAKE 1 TABLET BY MOUTH  EVERY DAY, Disp: 90 tablet, Rfl: 2   BD PEN NEEDLE NANO 2ND GEN 32G X 4 MM MISC, USE WITH TRESIBA, Disp: 100 each, Rfl: 10   Blood Glucose Calibration (OT ULTRA/FASTTK CNTRL SOLN) SOLN, , Disp: , Rfl:    Blood Glucose Monitoring Suppl (ONE TOUCH ULTRA 2) w/Device KIT, Use as directed to check blood sugars 2 times per day dx:e11.22, Disp: 1 each, Rfl: 1   carboxymethylcellulose (REFRESH PLUS) 0.5 % SOLN, Place 1 drop into both eyes daily., Disp: , Rfl:    chlorthalidone (HYGROTON) 25 MG tablet, Take 25 mg by mouth daily., Disp: , Rfl:    cholecalciferol (VITAMIN D3) 25 MCG (1000 UNIT) tablet, Take 1,000 Units by mouth daily., Disp: , Rfl:    dapagliflozin propanediol (FARXIGA) 10 MG TABS tablet, Take 1 tablet (10 mg total) by mouth daily before breakfast., Disp: 90 tablet, Rfl: 3   gabapentin (NEURONTIN) 300 MG capsule, TAKE 1 CAPSULE BY MOUTH EVERYDAY AT BEDTIME, Disp: 90 capsule, Rfl: 2   hydrALAZINE (APRESOLINE) 25 MG tablet, TAKE 1 TABLET BY MOUTH TWICE A DAY, Disp: 60 tablet, Rfl: 1   insulin  degludec (TRESIBA FLEXTOUCH) 100 UNIT/ML SOPN FlexTouch Pen, Inject 16 Units into the skin at bedtime. , Disp: , Rfl:    lidocaine (LIDODERM) 5 %, Place 1 patch onto the skin daily. Remove & Discard patch within 12 hours or as directed by MD, Disp: 30 patch, Rfl: 2   meclizine (ANTIVERT) 25 MG tablet, TAKE 1 TABLET (25 MG TOTAL) BY MOUTH 3 (THREE) TIMES DAILY AS NEEDED FOR DIZZINESS., Disp: 30 tablet, Rfl: 0   OneTouch Delica Lancets 33G MISC, Use as directed to check blood sugars 2 times per day dx: e11.22, Disp: 300 each, Rfl: 2   ONETOUCH ULTRA test strip, USE AS INSTRUCTED TO CHECK BLOOD SUGARS 2 TIMES PER DAY DX: E11.22, Disp: 200 strip, Rfl: 4   Semaglutide, 1 MG/DOSE, 4 MG/3ML SOPN, Inject 1 mg into the skin once a week., Disp: 3 mL, Rfl: 2   sodium bicarbonate 650 MG tablet, TAKE 1 TABLET BY MOUTH EVERY DAY, Disp: 90 tablet, Rfl: 3   tiZANidine (ZANAFLEX) 4 MG tablet, Take 1 tablet (4 mg total) by  mouth every 6 (six) hours as needed for muscle spasms., Disp: 40 tablet, Rfl: 0   triamcinolone cream (KENALOG) 0.1 %, Apply 1 application topically 2 (two) times daily. Apply, Disp: 45 g, Rfl: 1   amLODipine (NORVASC) 10 MG tablet, TAKE 1 TABLET BY MOUTH EVERY DAY, Disp: 90 tablet, Rfl: 2   famotidine (HM FAMOTIDINE) 10 MG tablet, Take 1 tablet (10 mg total) by mouth daily., Disp: 90 tablet, Rfl: 1   methimazole (TAPAZOLE) 5 MG tablet, TAKE 1 TABLET (5 MG TOTAL) BY MOUTH DAILY., Disp: 90 tablet, Rfl: 1   olmesartan (BENICAR) 40 MG tablet, Take 1 tablet (40 mg total) by mouth daily., Disp: 90 tablet, Rfl: 2   propranolol ER (INDERAL LA) 120 MG 24 hr capsule, TAKE 1 CAPSULE BY MOUTH EVERY DAY, Disp: 90 capsule, Rfl: 2   No Known Allergies   Review of Systems  Constitutional: Negative.   HENT: Negative.    Respiratory: Negative.    Cardiovascular: Negative.   Endocrine: Negative.   Musculoskeletal:  Positive for back pain and myalgias.  Neurological: Negative.      Today's Vitals   04/08/23 1410  BP: 130/70  Pulse: 78  Temp: 98.4 F (36.9 C)  TempSrc: Oral  Weight: 229 lb (103.9 kg)  Height: 5\' 1"  (1.549 m)  PainSc: 8   PainLoc: Back   Body mass index is 43.27 kg/m.  Wt Readings from Last 3 Encounters:  04/08/23 229 lb (103.9 kg)  03/19/23 236 lb (107 kg)  03/18/23 234 lb 9.6 oz (106.4 kg)    The 10-year ASCVD risk score (Arnett DK, et al., 2019) is: 31.5%   Values used to calculate the score:     Age: 11 years     Sex: Female     Is Non-Hispanic African American: Yes     Diabetic: Yes     Tobacco smoker: No     Systolic Blood Pressure: 130 mmHg     Is BP treated: Yes     HDL Cholesterol: 63 mg/dL     Total Cholesterol: 164 mg/dL  Objective:  Physical Exam HENT:     Head: Normocephalic.  Eyes:     General: Scleral icterus present.  Pulmonary:     Effort: Pulmonary effort is normal.     Breath sounds: Normal breath sounds.  Musculoskeletal:        General:  Tenderness present.  Neurological:  Mental Status: She is alert and oriented to person, place, and time.         Assessment And Plan:  Acute bilateral low back pain with bilateral sciatica -     Ambulatory referral to Orthopedic Surgery -     Triamcinolone Acetonide -     Ambulatory referral to Physical Therapy -     Lidocaine; Place 1 patch onto the skin daily. Remove & Discard patch within 12 hours or as directed by MD  Dispense: 30 patch; Refill: 2 -     traMADol HCl; Take 1 tablet (50 mg total) by mouth every 6 (six) hours as needed for up to 5 days for severe pain (pain score 7-10).  Dispense: 20 tablet; Refill: 0  Class 3 severe obesity due to excess calories with serious comorbidity and body mass index (BMI) of 40.0 to 44.9 in adult [facility]) Assessment & Plan: She is encouraged to strive for BMI less than 30 to decrease cardiac risk. Advised to aim for at least 150 minutes of exercise per week.      Return for keep same next.  Patient was given opportunity to ask questions. Patient verbalized understanding of the plan and was able to repeat key elements of the plan. All questions were answered to their satisfaction.    I, Ellender Hose, NP, have reviewed all documentation for this visit. The documentation on 04/14/2023 for the exam, diagnosis, procedures, and orders are all accurate and complete.   IF YOU HAVE BEEN REFERRED TO A SPECIALIST, IT MAY TAKE 1-2 WEEKS TO SCHEDULE/PROCESS THE REFERRAL. IF YOU HAVE NOT HEARD FROM US/SPECIALIST IN TWO WEEKS, PLEASE GIVE [place] A CALL AT [phone].

## 2023-04-14 ENCOUNTER — Other Ambulatory Visit: Payer: Self-pay | Admitting: Internal Medicine

## 2023-04-14 NOTE — Assessment & Plan Note (Signed)
 She is encouraged to strive for BMI less than 30 to decrease cardiac risk. Advised to aim for at least 150 minutes of exercise per week.

## 2023-04-15 ENCOUNTER — Emergency Department (HOSPITAL_BASED_OUTPATIENT_CLINIC_OR_DEPARTMENT_OTHER)
Admission: EM | Admit: 2023-04-15 | Discharge: 2023-04-15 | Disposition: A | Discharge status: Home / Self Care | Attending: Emergency Medicine | Admitting: Emergency Medicine

## 2023-04-15 ENCOUNTER — Emergency Department (HOSPITAL_BASED_OUTPATIENT_CLINIC_OR_DEPARTMENT_OTHER)

## 2023-04-15 ENCOUNTER — Other Ambulatory Visit: Payer: Self-pay

## 2023-04-15 ENCOUNTER — Ambulatory Visit: Payer: Self-pay | Admitting: Internal Medicine

## 2023-04-15 ENCOUNTER — Encounter (HOSPITAL_BASED_OUTPATIENT_CLINIC_OR_DEPARTMENT_OTHER): Payer: Self-pay | Admitting: Emergency Medicine

## 2023-04-15 DIAGNOSIS — M51362 Other intervertebral disc degeneration, lumbar region with discogenic back pain and lower extremity pain: Secondary | ICD-10-CM | POA: Diagnosis not present

## 2023-04-15 DIAGNOSIS — Z7982 Long term (current) use of aspirin: Secondary | ICD-10-CM | POA: Insufficient documentation

## 2023-04-15 DIAGNOSIS — M47816 Spondylosis without myelopathy or radiculopathy, lumbar region: Secondary | ICD-10-CM | POA: Diagnosis not present

## 2023-04-15 DIAGNOSIS — N281 Cyst of kidney, acquired: Secondary | ICD-10-CM | POA: Diagnosis not present

## 2023-04-15 DIAGNOSIS — Q6102 Congenital multiple renal cysts: Secondary | ICD-10-CM | POA: Insufficient documentation

## 2023-04-15 DIAGNOSIS — M4807 Spinal stenosis, lumbosacral region: Secondary | ICD-10-CM | POA: Diagnosis not present

## 2023-04-15 DIAGNOSIS — M48061 Spinal stenosis, lumbar region without neurogenic claudication: Secondary | ICD-10-CM | POA: Diagnosis not present

## 2023-04-15 DIAGNOSIS — M4805 Spinal stenosis, thoracolumbar region: Secondary | ICD-10-CM | POA: Diagnosis not present

## 2023-04-15 DIAGNOSIS — M545 Low back pain, unspecified: Secondary | ICD-10-CM | POA: Diagnosis present

## 2023-04-15 LAB — URINALYSIS, ROUTINE W REFLEX MICROSCOPIC
Bacteria, UA: NONE SEEN
Bilirubin Urine: NEGATIVE
Glucose, UA: 500 mg/dL — AB
Ketones, ur: NEGATIVE mg/dL
Leukocytes,Ua: NEGATIVE
Nitrite: NEGATIVE
Protein, ur: 30 mg/dL — AB
Specific Gravity, Urine: 1.008 (ref 1.005–1.030)
pH: 7 (ref 5.0–8.0)

## 2023-04-15 MED ORDER — OXYCODONE-ACETAMINOPHEN 5-325 MG PO TABS
2.0000 | ORAL_TABLET | Freq: Once | ORAL | Status: AC
  Administered 2023-04-15: 2 via ORAL
  Filled 2023-04-15: qty 2

## 2023-04-15 MED ORDER — OXYCODONE-ACETAMINOPHEN 5-325 MG PO TABS: 1.0000 | ORAL_TABLET | ORAL | 0 refills | Status: DC | PRN

## 2023-04-15 NOTE — ED Triage Notes (Signed)
 Pt arrived POV with family c/o back pain and pain radiating down legs bilateral. Pt states it has been ongoing, however the pain now feels like burning pain down back of legs which was shooting pain before. Pt denies any recent fall or trauma. Pt has been taking tramadol last taken 0430, lidocaine patch placed just prior to coming to the ED.

## 2023-04-15 NOTE — ED Provider Notes (Signed)
 [place] EMERGENCY DEPARTMENT AT [facility] Provider Note   CSN: [id number] Arrival date & time: 04/15/23  4098     History  Chief Complaint  Patient presents with   Back Pain    Teresa Stanley is a 78 y.o. female.  Patient complains of low back pain.  Patient reports that she has been having increased low back pain for several weeks.  Patient was seen here and treated with Percocet.  Patient reports she saw her primary care physician and was started on tramadol.  Patient's daughter reports patient missed a dose of medicine yesterday and has been having significant pain since.  Patient has a lidocaine patch in the mid low back that she states is the area of pain.  Patient reports today she has had pain shooting down both legs.  Patient has a past medical history of neuropathy.  Patient is taking tramadol and gabapentin for the pain.  She reports difficulty walking due to pain.  Patient has not had any abdominal pain she denies any nausea vomiting or diarrhea.  Patient denies any weakness   Back Pain      Home Medications Prior to Admission medications   Medication Sig Start Date End Date Taking? Authorizing Provider  traMADol (ULTRAM) 50 MG tablet Take 50 mg by mouth. 04/12/23  Yes [provider]  amLODipine (NORVASC) 10 MG tablet TAKE 1 TABLET BY MOUTH EVERY DAY 04/14/23   Teresa Peng, MD  aspirin 81 MG tablet Take 81 mg by mouth daily.    [provider]  atorvastatin (LIPITOR) 40 MG tablet TAKE 1 TABLET BY MOUTH EVERY DAY 03/12/23   Teresa Peng, MD  BD PEN NEEDLE NANO 2ND GEN 32G X 4 MM MISC USE WITH TRESIBA 07/01/21   Teresa Peng, MD  Blood Glucose Calibration (OT ULTRA/FASTTK CNTRL SOLN) SOLN  01/08/21   [provider]  Blood Glucose Monitoring Suppl (ONE TOUCH ULTRA 2) w/Device KIT Use as directed to check blood sugars 2 times per day dx:e11.22 06/08/18   Teresa Peng, MD  carboxymethylcellulose (REFRESH PLUS) 0.5 % SOLN Place 1 drop  into both eyes daily.    [provider]  chlorthalidone (HYGROTON) 25 MG tablet Take 25 mg by mouth daily. 10/11/19   [provider]  cholecalciferol (VITAMIN D3) 25 MCG (1000 UNIT) tablet Take 1,000 Units by mouth daily.    [provider]  dapagliflozin propanediol (FARXIGA) 10 MG TABS tablet Take 1 tablet (10 mg total) by mouth daily before breakfast. 02/16/23   Teresa Peng, MD  famotidine (HM FAMOTIDINE) 10 MG tablet Take 1 tablet (10 mg total) by mouth daily. 03/12/22 09/08/22  Teresa Peng, MD  gabapentin (NEURONTIN) 300 MG capsule TAKE 1 CAPSULE BY MOUTH EVERYDAY AT BEDTIME 10/27/22   Teresa Peng, MD  hydrALAZINE (APRESOLINE) 25 MG tablet TAKE 1 TABLET BY MOUTH TWICE A DAY 11/23/20   Teresa Peng, MD  insulin degludec (TRESIBA FLEXTOUCH) 100 UNIT/ML SOPN FlexTouch Pen Inject 16 Units into the skin at bedtime.     [provider]  lidocaine (LIDODERM) 5 % Place 1 patch onto the skin daily. Remove & Discard patch within 12 hours or as directed by MD 04/08/23   Ellender Hose, NP  meclizine (ANTIVERT) 25 MG tablet TAKE 1 TABLET (25 MG TOTAL) BY MOUTH 3 (THREE) TIMES DAILY AS NEEDED FOR DIZZINESS. 02/22/20   Teresa Peng, MD  methimazole (TAPAZOLE) 5 MG tablet TAKE 1 TABLET (5 MG TOTAL) BY MOUTH DAILY. 04/14/23  Teresa Peng, MD  olmesartan (BENICAR) 40 MG tablet Take 1 tablet (40 mg total) by mouth daily. 10/09/20 08/11/22  Teresa Peng, MD  OneTouch Delica Lancets 33G MISC Use as directed to check blood sugars 2 times per day dx: e11.22 06/25/20   Teresa Peng, MD  [facility] ULTRA test strip USE AS INSTRUCTED TO CHECK BLOOD SUGARS 2 TIMES PER DAY DX: E11.22 02/11/21   Teresa Peng, MD  propranolol ER (INDERAL LA) 120 MG 24 hr capsule TAKE 1 CAPSULE BY MOUTH EVERY DAY 04/14/23   Teresa Peng, MD  Semaglutide, 1 MG/DOSE, 4 MG/3ML SOPN Inject 1 mg into the skin once a week. 06/18/20   Teresa Peng, MD  sodium bicarbonate 650 MG tablet TAKE 1 TABLET BY MOUTH  EVERY DAY 09/09/22   Teresa Peng, MD  tiZANidine (ZANAFLEX) 4 MG tablet Take 1 tablet (4 mg total) by mouth every 6 (six) hours as needed for muscle spasms. 03/20/23   Dione Booze, MD  triamcinolone cream (KENALOG) 0.1 % Apply 1 application topically 2 (two) times daily. Apply 08/09/19   Teresa Peng, MD      Allergies    Patient has no known allergies.    Review of Systems   Review of Systems  Musculoskeletal:  Positive for back pain.  All other systems reviewed and are negative.   Physical Exam Updated Vital Signs BP (!) 172/85   Pulse 71   Temp 98.4 F (36.9 C) (Oral)   Resp 20   Ht 5\' 1"  (1.549 m)   Wt 103.9 kg   SpO2 97%   BMI 43.28 kg/m  Physical Exam Vitals and nursing note reviewed.  Constitutional:      Appearance: She is well-developed.  HENT:     Head: Normocephalic.  Cardiovascular:     Rate and Rhythm: Normal rate.  Pulmonary:     Effort: Pulmonary effort is normal.  Abdominal:     General: There is no distension.  Musculoskeletal:     Cervical back: Normal range of motion.     Comments: Tender L1-L5 area diffusely pain with moving neurovascular is intact  Skin:    General: Skin is warm.  Neurological:     General: No focal deficit present.     Mental Status: She is alert and oriented to person, place, and time.     ED Results / Procedures / Treatments   Labs (all labs ordered are listed, but only abnormal results are displayed) Labs Reviewed - No data to display  EKG None  Radiology US Renal Result Date: 04/15/2023 CLINICAL DATA:  Back pain follow-up cyst EXAM: RENAL / URINARY TRACT ULTRASOUND COMPLETE COMPARISON:  10/27/2019 ultrasound FINDINGS: Right Kidney: Renal measurements: 12.2 x 6.5 by 4.5 cm. = volume: 186.9 mL. Echogenic cortex with poor corticomedullary differentiation. No hydronephrosis. Multiple cysts, fewer than 10. The largest is seen at the upper pole and measures 3.7 x 3.8 x 3.5 cm Left Kidney: Renal measurements: 12.9 by 6.7  x 3.8 cm = volume: 173.4 mL. Echogenic cortex. No hydronephrosis. Multiple, fewer than 10 cysts. The largest is seen at the midpole and measures 1.7 x 1.7 x 2.1 cm. Bladder: Appears normal for degree of bladder distention. Other: None. IMPRESSION: 1. Negative for hydronephrosis. Echogenic kidneys consistent with medical renal disease. 2. Multiple bilateral renal cysts for which no further imaging follow-up is recommended Electronically Signed   By: Jasmine Pang M.D.   On: 04/15/2023 15:45   CT Lumbar Spine Wo Contrast Result Date: 04/15/2023 CLINICAL  DATA:  Lower back pain, pain radiating down both legs. EXAM: CT LUMBAR SPINE WITHOUT CONTRAST TECHNIQUE: Multidetector CT imaging of the lumbar spine was performed without intravenous contrast administration. Multiplanar CT image reconstructions were also generated. RADIATION DOSE REDUCTION: This exam was performed according to the departmental dose-optimization program which includes automated exposure control, adjustment of the mA and/or kV according to patient size and/or use of iterative reconstruction technique. COMPARISON:  None Available. FINDINGS: Segmentation: 5 lumbar type vertebrae. Alignment: Lumbar lordosis is maintained. Grade 1 anterolisthesis of L4 on L5. Mild levocurvature of the lumbar spine centered at L2-3. Vertebrae: Degenerative endplate changes at multiple levels throughout the lumbar spine. Prominent endplate osteophytes at multiple levels. No fracture or compression fracture in the lumbar spine. No suspicious osseous lesion. Degenerative changes of the bilateral sacroiliac joints with symmetric sclerosis noted. Paraspinal and other soft tissues: The paraspinal musculature is unremarkable. Moderate atherosclerosis of the abdominal aorta and branch vessels. Partially visualized cyst in the right kidney. 0.9 cm hyperattenuating focus in the upper pole of the right kidney. Disc levels: T12-L1: Moderate disc space narrowing. Disc bulge and  posterior osteophytes along with facet arthrosis resulting in mild spinal canal stenosis. Mild foraminal stenosis on the right. L1-2: Moderate disc space narrowing. Disc bulge and posterior osteophyte slightly eccentric to the left. Bilateral facet arthrosis. Moderate spinal canal stenosis. There is possible narrowing of the lateral recesses. Moderate right and moderate to severe left foraminal stenosis. L2-3: Moderate disc space narrowing. Disc bulge and posterior osteophytes along with facet arthrosis resulting in mild spinal canal stenosis. Moderate bilateral foraminal stenosis. L3-4: Moderate disc space narrowing. Disc bulge and posterior osteophytes along with facet arthrosis and thickening of the ligamentum flavum resulting in moderate spinal canal stenosis. Moderate bilateral foraminal stenosis. L4-5: Moderate disc space narrowing. Grade 1 anterolisthesis with partial uncovering of the disc. Disc bulge and posterior osteophytes along with facet arthrosis resulting in severe spinal canal stenosis. Moderate bilateral foraminal stenosis. L5-S1: Moderate disc space narrowing. Small disc bulge. No significant spinal canal stenosis. Bilateral facet arthrosis. Severe bilateral foraminal stenosis. IMPRESSION: Degenerative changes of the lumbar spine as above. Severe spinal canal stenosis at L4-5. Additional moderate spinal canal stenosis at L1-2 and L3-4. Multilevel foraminal stenosis, greatest and severe at L5-S1. Degenerative changes of the sacroiliac joints. 0.9 cm hyperattenuating focus within the right kidney which could reflect a cyst with hemorrhagic or proteinaceous debris. Consider renal ultrasound for further evaluation. Electronically Signed   By: Emily Filbert M.D.   On: 04/15/2023 11:29    Procedures Procedures    Medications Ordered in ED Medications  oxyCODONE-acetaminophen (PERCOCET/ROXICET) 5-325 MG per tablet 2 tablet (2 tablets Oral Given 04/15/23 0948)    ED Course/ Medical Decision  Making/ A&P                                 Medical Decision Making Patient complains of pain in her low back for several weeks she has been seen here and by primary care.  Amount and/or Complexity of Data Reviewed Independent Historian:     Details: Patient is here with her daughter who is supportive Labs: ordered.    Details: UA shows no acute findings Radiology: ordered and independent interpretation performed. Decision-making details documented in ED Course.    Details: CT LS spine shows degenerative changes with severe spinal stenosis.  Radiologist reported a area of hyper attenuation concerning for renal cyst and advised ultrasound.  Renal ultrasound was obtained and shows a renal cyst radiologist reports no need for further imaging.  Risk Prescription drug management. Risk Details: Patient with 2 Percocet tablets here.  Patient reports pain has significantly improved with the medication.  I discussed the results with the patient and her daughter.  Patient is given a prescription for 12 Percocet.  I have asked her daughter to assist patient with management of her medicines so that she is not taking the Percocet and previously prescribed tramadol.  I advised schedule patient to see her primary care physician for recheck and further pain management.  Return to the emergency department if any problems.           Final Clinical Impression(s) / ED Diagnoses Final diagnoses:  Degeneration of intervertebral disc of lumbar region with discogenic back pain and lower extremity pain    Rx / DC Orders ED Discharge Orders     None      An After Visit Summary was printed and given to the patient.    Elson Areas, PA-C 04/15/23 1650    Estelle June A, DO 04/23/23 1426

## 2023-04-20 ENCOUNTER — Ambulatory Visit (INDEPENDENT_AMBULATORY_CARE_PROVIDER_SITE_OTHER): Admitting: Internal Medicine

## 2023-04-20 ENCOUNTER — Encounter: Payer: Self-pay | Admitting: Internal Medicine

## 2023-04-20 VITALS — BP 148/80 | HR 90 | Temp 98.0°F | Ht 61.0 in | Wt 222.6 lb

## 2023-04-20 DIAGNOSIS — I129 Hypertensive chronic kidney disease with stage 1 through stage 4 chronic kidney disease, or unspecified chronic kidney disease: Secondary | ICD-10-CM

## 2023-04-20 DIAGNOSIS — M48062 Spinal stenosis, lumbar region with neurogenic claudication: Secondary | ICD-10-CM

## 2023-04-20 DIAGNOSIS — N1831 Chronic kidney disease, stage 3a: Secondary | ICD-10-CM

## 2023-04-20 DIAGNOSIS — E66813 Obesity, class 3: Secondary | ICD-10-CM

## 2023-04-20 DIAGNOSIS — Z6841 Body Mass Index (BMI) 40.0 and over, adult: Secondary | ICD-10-CM

## 2023-04-20 MED ORDER — OXYCODONE-ACETAMINOPHEN 5-325 MG PO TABS
1.0000 | ORAL_TABLET | ORAL | 0 refills | Status: DC | PRN
Start: 2023-04-20 — End: 2023-04-29

## 2023-04-20 NOTE — Therapy (Unsigned)
 OUTPATIENT PHYSICAL THERAPY THORACOLUMBAR EVALUATION   Patient Name: Teresa Stanley MRN: [id number] DOB:01-10-1946, 78 y.o., female Today's Date: 04/21/2023  END OF SESSION:  PT End of Session - 04/21/23 1320     Visit Number 1    Number of Visits 12    Date for PT Re-Evaluation 06/21/23    Authorization Type [employer] MCR    PT Start Time 1130    PT Stop Time 1215    PT Time Calculation (min) 45 min    Activity Tolerance Patient tolerated treatment well    Behavior During Therapy WFL for tasks assessed/performed             Past Medical History:  Diagnosis Date   Arthritis    Diabetes mellitus    takes Actos and Metformin daily and Victoza   GERD (gastroesophageal reflux disease)    takes Omeprazole daily   Headache(784.0)    occasionally   Hyperlipidemia    takes Atorvastatin daily   Hypertension    takes Lisinopril,Amlodipine,and Metoprolol daily   Hypothyroidism    Joint pain    Thyroid disease    ?   Past Surgical History:  Procedure Laterality Date   ABDOMINAL HYSTERECTOMY     BREAST BIOPSY Left    BREAST EXCISIONAL BIOPSY Left    CARDIAC CATHETERIZATION  2009   CATARACT EXTRACTION Right    EYE SURGERY     JOINT REPLACEMENT     KNEE ARTHROPLASTY Left 04/04/2013   Procedure: COMPUTER ASSISTED TOTAL KNEE ARTHROPLASTY;  Surgeon: Eldred Manges, MD;  Location: [facility] OR;  Service: Orthopedics;  Laterality: Left;  Left Total Knee Arthroplasty, Cemented   TOTAL KNEE ARTHROPLASTY Right 11/26/2015   Procedure: TOTAL KNEE ARTHROPLASTY;  Surgeon: Eldred Manges, MD;  Location: [facility] OR;  Service: Orthopedics;  Laterality: Right;   Patient Active Problem List   Diagnosis Date Noted   Viral upper respiratory tract infection 03/29/2023   Discomfort of right ear 11/30/2022   Mixed hyperlipidemia 04/06/2022   Class 3 severe obesity due to excess calories with serious comorbidity and body mass index (BMI) of 40.0 to 44.9 in adult (HCC) 05/28/2018   Parenchymal renal hypertension  08/11/2017   Right knee DJD 11/26/2015   S/P total knee arthroplasty 04/18/2013   Unilateral primary osteoarthritis, right knee 04/04/2013    Class: Diagnosis of   Candidal intertrigo 08/09/2012   Hypertension 12/15/2011   Type 2 diabetes mellitus with stage 3b chronic kidney disease, with long-term current use of insulin (HCC) 12/15/2011    PCP: Dorothyann Peng, MD   REFERRING PROVIDER: Ellender Hose, NP  REFERRING DIAG: [id number] (ICD-10-CM) - Acute bilateral low back pain with bilateral sciatica  Rationale for Evaluation and Treatment: Rehabilitation  THERAPY DIAG:  Other low back pain - Plan: PT plan of care cert/re-cert  Spinal stenosis of lumbar region, unspecified whether neurogenic claudication present - Plan: PT plan of care cert/re-cert  Muscle weakness (generalized) - Plan: PT plan of care cert/re-cert  ONSET DATE: acute on chronic  SUBJECTIVE:  SUBJECTIVE STATEMENT: Patient is a 78 year old female who  presents today for lower back pain that goes down her legs. Patient reports the back pain first started about 03/19/2023 and  reports that it  got better then started back again on Monday. Patient reports she was treated with oxycodone and muscle relaxer when she initially started hurting.  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 Her back is not tender to touch but hurts when she leans forward and backwards. Patient rates her pain 7/10 when she is up walking, she is here with her daughter.  PERTINENT HISTORY:  Patient had bilateral knee replacement in 2015 and 2017 respectively. Her back is not tender to touch but hurts when she leans forward and backwards. Patient rates her pain 7/10 when she is up walking, she is here with her daughter.  PAIN:  Are you  having pain? Yes: NPRS scale: 9/10 Pain location: low back  Pain description: ache Aggravating factors: walking and weight bearing tasks Relieving factors: sitting, heat and   PRECAUTIONS: None  RED FLAGS: None   WEIGHT BEARING RESTRICTIONS: No  FALLS:  Has patient fallen in last 6 months? No  OCCUPATION: not working  PLOF: Independent  PATIENT GOALS: To manage my back pain  NEXT MD VISIT: TBD  OBJECTIVE:  Note: Objective measures were completed at Evaluation unless otherwise noted.  DIAGNOSTIC FINDINGS:  IMPRESSION: Degenerative changes of the lumbar spine as above. Severe spinal canal stenosis at L4-5. Additional moderate spinal canal stenosis at L1-2 and L3-4.   Multilevel foraminal stenosis, greatest and severe at L5-S1.   Degenerative changes of the sacroiliac joints.   0.9 cm hyperattenuating focus within the right kidney which could reflect a cyst with hemorrhagic or proteinaceous debris. Consider renal ultrasound for further evaluation.     Electronically Signed   By: Emily Filbert M.D.   On: 04/15/2023 11:29  PATIENT SURVEYS:  Modified Oswestry 23/50 46% perceived disability   MUSCLE LENGTH: Hamstrings: Right 70 deg; Left 70 deg   POSTURE: rounded shoulders trunk shift to R  PALPATION: deferred  LUMBAR ROM:   AROM eval  Flexion 90%  Extension 0%  Right lateral flexion 75%  Left lateral flexion 75%  Right rotation 50%  Left rotation 25%   (Blank rows = not tested)  LOWER EXTREMITY ROM:   WFL for gait and transfers  Active  Right eval Left eval  Hip flexion    Hip extension    Hip abduction    Hip adduction    Hip internal rotation    Hip external rotation    Knee flexion    Knee extension    Ankle dorsiflexion    Ankle plantarflexion    Ankle inversion    Ankle eversion     (Blank rows = not tested)  LOWER EXTREMITY MMT:    MMT Right eval Left eval  Hip flexion    Hip extension 4- 4-  Hip abduction 4- 4-  Hip  adduction    Hip internal rotation    Hip external rotation    Knee flexion    Knee extension 4- 4-  Ankle dorsiflexion    Ankle plantarflexion 4- 4-  Ankle inversion    Ankle eversion     (Blank rows = not tested)  LUMBAR SPECIAL TESTS:  Straight leg raise test: Negative and Slump test: Negative  FUNCTIONAL TESTS:  30 seconds chair stand test 7 reps with UE assist  GAIT: Distance walked: 85ft  x2 Assistive device utilized: Single point cane Level of assistance: Modified independence Comments: slow cadence an antalgic gait  TREATMENT DATE:  [facility] Adult PT Treatment:                                                DATE: 04/21/23  Self Care: Additional minutes spent for educating on updated Therapeutic Home Exercise Program as well as comparing current status to condition at start of care. This included exercises focusing on stretching, strengthening, with focus on eccentric aspects. Long term goals include an improvement in range of motion, strength, endurance as well as avoiding reinjury. Patient's frequency would include in 1-2 times a day, 3-5 times a week for a duration of 6-12 weeks. Proper technique shown and discussed handout in great detail. All questions were discussed and addressed.                                                                                                                             PATIENT EDUCATION:  Education details: Discussed eval findings, rehab rationale and POC and patient is in agreement  Person educated: Patient Education method: Explanation Education comprehension: verbalized understanding and needs further education  HOME EXERCISE PROGRAM: Access Code: [email or web address] URL: [email or web address] Date: 04/21/2023 Prepared by: Gustavus Bryant  Exercises - Sit to Stand with Armchair  - 1 x daily - 5 x weekly - 1 sets - 5 reps - Seated Long Arc Quad  - 1 x daily - 5 x weekly - 2 sets - 10 reps - Heel Toe Raises with Unilateral  Counter Support  - 1 x daily - 5 x weekly - 2 sets - 10 reps  ASSESSMENT:  CLINICAL IMPRESSION: Patient is a 78 y.o. female who was seen today for physical therapy evaluation and treatment for chronic low back pain due to degenerative changes and underlying stenosis.  Patient demonstrates decreased trunk mobility especially extension.  LE strength deficits identified on 30s chair stand test.  Nerve tensioning did not elicit symptoms.  Discussed possibility of aquatic PT and will consider.  OBJECTIVE IMPAIRMENTS: Abnormal gait, decreased activity tolerance, decreased knowledge of condition, decreased mobility, difficulty walking, decreased strength, impaired perceived functional ability, improper body mechanics, postural dysfunction, obesity, and pain.   ACTIVITY LIMITATIONS: carrying, lifting, bending, sitting, standing, squatting, stairs, and transfers  PERSONAL FACTORS: Age, Fitness, Past/current experiences, and Time since onset of injury/illness/exacerbation are also affecting patient's functional outcome.   REHAB POTENTIAL: Fair based on chronicity and diagnosis  CLINICAL DECISION MAKING: Stable/uncomplicated  EVALUATION COMPLEXITY: Low   GOALS: Goals reviewed with patient? No  SHORT TERM GOALS: Target date: 05/12/2023  Patient to demonstrate independence in HEP  Baseline: Access Code: [email or web address] Goal status: INITIAL  2.  Assess 2 MWT test Baseline: TBD Goal status: INITIAL   LONG TERM GOALS: Target date: 06/02/2023  Patient will acknowledge 6/10 worst pain at least once during episode of care   Baseline: 9/10 Goal status: INITIAL  2.  Patient will score at least 33% on FOTO to signify clinically meaningful improvement in functional abilities.   Baseline: 46% Goal status: INITIAL  3.  Patient will increase 30s chair stand reps from 7 to 9 with/without arms to demonstrate and improved functional ability with less pain/difficulty as well as reduce fall risk.  Baseline:  7 Goal status: INITIAL  4.  Increase BLE strength to 4/5 Baseline:  MMT Right eval Left eval  Hip flexion    Hip extension 4- 4-  Hip abduction 4- 4-  Hip adduction    Hip internal rotation    Hip external rotation    Knee flexion    Knee extension 4- 4-  Ankle dorsiflexion    Ankle plantarflexion 4- 4-   Goal status: INITIAL  5.  Assess progress on 2 MWT Baseline: TBD Goal status: INITIAL    PLAN:  PT FREQUENCY: 2x/week  PT DURATION: 6 weeks  PLANNED INTERVENTIONS: 97164- PT Re-evaluation, 97110-Therapeutic exercises, 97530- Therapeutic activity, 97112- Neuromuscular re-education, 97535- Self Care, 16109- Manual therapy, [phone]- Gait training, [phone]- Aquatic Therapy, Patient/Family education, Dry Needling, Joint mobilization, and Spinal mobilization.  PLAN FOR NEXT SESSION: HEP review and update, manual techniques as appropriate, aerobic tasks, ROM and flexibility activities, strengthening and PREs, TPDN, gait and balance training as needed   Date of referral: 04/08/23 Referring provider: Ellender Hose, NP Referring diagnosis? M54.42,M54.41 (ICD-10-CM) - Acute bilateral low back pain with bilateral sciatica Treatment diagnosis? (if different than referring diagnosis) M54.42,M54.41 (ICD-10-CM) - Acute bilateral low back pain with bilateral sciatica  What was this (referring dx) caused by? Arthritis  Nature of Condition: Chronic (continuous duration > 3 months)   Laterality: Both  Current Functional Measure Score: Back Index 23/50  Objective measurements identify impairments when they are compared to normal values, the uninvolved extremity, and prior level of function.  [x]  Yes  []  No  Objective assessment of functional ability: Moderate functional limitations   Briefly describe symptoms: Patient is a 78 year old female who  presents today for lower back pain that goes down her legs  How did symptoms start: chronic over time  Average pain intensity:  Last 24  hours: 7  Past week: 7  How often does the pt experience symptoms? Frequently  How much have the symptoms interfered with usual daily activities? Quite a bit  How has condition changed since care began at this facility? NA - initial visit  In general, how is the patients overall health? Good   BACK PAIN (STarT Back Screening Tool) Has pain spread down the leg(s) at some time in the last 2 weeks? y Has there been pain in the shoulder or neck at some time in the last 2 weeks? n Has the pt only walked short distances because of back pain? y Has patient dressed more slowly because of back pain in the past 2 weeks? y Does patient think it's not safe for a person with this condition to be physically active? n Does patient have worrying thoughts a lot of the time? n Does patient feel back pain is terrible and will never get any better? n Has patient stopped enjoying things they usually enjoy? n    Hildred Laser, PT 04/21/2023, 3:26 PM

## 2023-04-20 NOTE — Patient Instructions (Signed)
Spinal Stenosis  Spinal stenosis happens when the spinal canal gets smaller. The spinal canal is the space between the bones of your spine (vertebrae). As the canal gets smaller, the nerves that pass that part of the spine are pressed. This causes pain, weakness, or loss of feeling (numbness) in your arms or legs. Spinal stenosis can affect your neck, upper back, or lower back. What are the causes? This condition is caused by parts of bone that push into your spinal canal. This problem may be present at birth. If it occurs after birth, the cause may be: Breakdown of bones of your spine. This normally starts between 16 and 80 years of age. Injury to your spine. Any surgeries you have had to your spine. Tumors in your spine. A buildup of calcium in your spine. What increases the risk? You are more likely to develop this condition if: You are older than age 88. You were born with a problem in your spine, such as a curved spine (scoliosis). You have arthritis. This is disease of your joints. What are the signs or symptoms? Common symptoms of this condition include: Pain in your neck or back. The pain may be worse when you stand or walk. Problems with your legs or arms. A leg or arm may lose feeling, tingle, or turn hot or cold. This can happen in one arm or leg, or both. Pain that goes from your butt down to your lower leg (sciatica). This can happen in one or both legs. Falling a lot. Foot drop. This is when you have trouble lifting the front part of your foot and it drags on the ground when you walk. Severe symptoms of this condition include: Problems pooping (having a bowel movement) or peeing (urinating). Trouble having sex. Loss of feeling in your leg. Being unable to walk. Symptoms may come on slowly and get worse over time. Sometimes there are no symptoms. How is this treated? To treat pain and manage symptoms, you may be asked to: Practice sitting and standing up straight. This is  good posture. Do exercises. Lose weight, if needed. Take medicines or get shots. Wear a corset or a brace. This supports your back. In some cases, you may need to have surgery. Follow these instructions at home: Managing pain, stiffness, and swelling  Stand and sit up straight. If you have a brace or a corset, wear it as told by your doctor. Keep a healthy weight. Talk with your doctor if you need help losing weight. If told, put heat on the affected area. Do this as often as told by your doctor. Use the heat source that your doctor recommends, such as a moist heat pack or a heating pad. Place a towel between your skin and the heat source. Leave the heat on for 20-30 minutes. If your skin turns bright red, take off the heat right away to prevent burns. The risk of burns is higher if you cannot feel pain, heat, or cold. Activity Do exercises as told by your doctor. Do not do anything that causes pain. Ask your doctor what activities are safe for you. You may have to avoid lifting. Ask your doctor how much you can lift. Return to your normal activities when your doctor says that it is safe. General instructions Take over-the-counter and prescription medicines only as told by your doctor. Do not smoke or use any products that contain nicotine or tobacco. If you need help quitting, ask your doctor. Eat a healthy diet. Eat  a lot of: Fruits. Vegetables. Whole grains. Low-fat (lean) protein. Where to find more information [employer] of Arthritis and Musculoskeletal and Skin Diseases: www.niams.http://www.myers.net/ Contact a doctor if: Your symptoms do not get better. Your symptoms get worse. You have a fever. Get help right away if: You have new pain or very bad pain in your neck or upper back. You have very bad pain, and medicine does not help. You have a very bad headache. You are dizzy. You do not see well. You vomit or feel like you may vomit. You have these things in your back or  legs: New or worse loss of feeling. New or worse tingling. You cannot control when you poop or pee. Your arm or leg: Hurts or swells. Turns red. Feels warm. These symptoms may be an emergency. Get help right away. Call 911. Do not wait to see if the symptoms will go away. Do not drive yourself to the hospital. Summary Spinal stenosis happens when the space between the bones of the spine gets smaller. This condition may be present at birth. Spinal stenosis can cause pain in the neck, back, legs, or butt. Treatment for this condition focuses on lessening your pain and other symptoms. This information is not intended to replace advice given to you by your health care provider. Make sure you discuss any questions you have with your health care provider. Document Revised: 05/06/2021 Document Reviewed: 04/23/2021 Elsevier Patient Education  2024 [employer].

## 2023-04-20 NOTE — Progress Notes (Signed)
 I,Teresa Stanley, CMA,acting as a Neurosurgeon for Teresa Aliment, MD.,have documented all relevant documentation on the behalf of Teresa Aliment, MD,as directed by  Teresa Aliment, MD while in the presence of Teresa Aliment, MD.  Subjective:  Patient ID: Teresa Stanley , female    DOB: 1945/07/29 , 78 y.o.   MRN: [id number]  Chief Complaint  Patient presents with   ER Follow Up    HPI  Patient presents today for ER follow up. She went to [facility] on 04/15/23 for further evaluation of back pain. She had CT lumbar spine performed. Findings significant for: degenerative changes of the lumbar spine, severe spinalcanal stenosis at L4-5, additional moderate spinal canal stenosis at L1-2 and L3-4, multilevel foraminal stenosis, greatest and severe at L5-S1.  Also with degenerative changes of the sacroiliac joints.  Lastly, a  0.9 cm hyperattenuating focus within the right kidney was an incidental finding. Renal u/s was performed, no acute issues were noted.  Multiple bilateral renal cysts for which no further imaging follow-up is recommended  She states she is now feeling much better than before. She was prescribed oxycodone in the ER, states this is more effective at managing her pain. She has one more pill of the Oxycodone left. She did take one this morning.  She is scheduled for PT tomorrow. She was referred by Dennie Bible, NP.       Past Medical History:  Diagnosis Date   Arthritis    Diabetes mellitus    takes Actos and Metformin daily and Victoza   GERD (gastroesophageal reflux disease)    takes Omeprazole daily   Headache(784.0)    occasionally   Hyperlipidemia    takes Atorvastatin daily   Hypertension    takes Lisinopril,Amlodipine,and Metoprolol daily   Hypothyroidism    Joint pain    Thyroid disease    ?     Family History  Problem Relation Age of Onset   Diabetes Mother    Hypertension Mother    Diabetes Father    Asthma Father    Hypertension Father     Breast cancer Sister 50   Hypertension Sister        2   Hypertension Sister    Diabetes Sister    Hypertension Brother        5   Kidney disease Brother    Hypertension Daughter    Colon cancer Neg Hx    Esophageal cancer Neg Hx    Rectal cancer Neg Hx    Stomach cancer Neg Hx      Current Outpatient Medications:    amLODipine (NORVASC) 10 MG tablet, TAKE 1 TABLET BY MOUTH EVERY DAY, Disp: 90 tablet, Rfl: 2   aspirin 81 MG tablet, Take 81 mg by mouth daily., Disp: , Rfl:    atorvastatin (LIPITOR) 40 MG tablet, TAKE 1 TABLET BY MOUTH EVERY DAY, Disp: 90 tablet, Rfl: 2   BD PEN NEEDLE NANO 2ND GEN 32G X 4 MM MISC, USE WITH TRESIBA, Disp: 100 each, Rfl: 10   Blood Glucose Calibration (OT ULTRA/FASTTK CNTRL SOLN) SOLN, , Disp: , Rfl:    Blood Glucose Monitoring Suppl (ONE TOUCH ULTRA 2) w/Device KIT, Use as directed to check blood sugars 2 times per day dx:e11.22, Disp: 1 each, Rfl: 1   carboxymethylcellulose (REFRESH PLUS) 0.5 % SOLN, Place 1 drop into both eyes daily., Disp: , Rfl:    chlorthalidone (HYGROTON) 25 MG tablet, Take 25 mg by mouth daily., Disp: ,  Rfl:    cholecalciferol (VITAMIN D3) 25 MCG (1000 UNIT) tablet, Take 1,000 Units by mouth daily., Disp: , Rfl:    dapagliflozin propanediol (FARXIGA) 10 MG TABS tablet, Take 1 tablet (10 mg total) by mouth daily before breakfast., Disp: 90 tablet, Rfl: 3   gabapentin (NEURONTIN) 300 MG capsule, TAKE 1 CAPSULE BY MOUTH EVERYDAY AT BEDTIME, Disp: 90 capsule, Rfl: 2   hydrALAZINE (APRESOLINE) 25 MG tablet, TAKE 1 TABLET BY MOUTH TWICE A DAY, Disp: 60 tablet, Rfl: 1   insulin degludec (TRESIBA FLEXTOUCH) 100 UNIT/ML SOPN FlexTouch Pen, Inject 16 Units into the skin at bedtime. , Disp: , Rfl:    lidocaine (LIDODERM) 5 %, Place 1 patch onto the skin daily. Remove & Discard patch within 12 hours or as directed by MD, Disp: 30 patch, Rfl: 2   meclizine (ANTIVERT) 25 MG tablet, TAKE 1 TABLET (25 MG TOTAL) BY MOUTH 3 (THREE) TIMES DAILY AS  NEEDED FOR DIZZINESS., Disp: 30 tablet, Rfl: 0   methimazole (TAPAZOLE) 5 MG tablet, TAKE 1 TABLET (5 MG TOTAL) BY MOUTH DAILY., Disp: 90 tablet, Rfl: 1   OneTouch Delica Lancets 33G MISC, Use as directed to check blood sugars 2 times per day dx: e11.22, Disp: 300 each, Rfl: 2   ONETOUCH ULTRA test strip, USE AS INSTRUCTED TO CHECK BLOOD SUGARS 2 TIMES PER DAY DX: E11.22, Disp: 200 strip, Rfl: 4   propranolol ER (INDERAL LA) 120 MG 24 hr capsule, TAKE 1 CAPSULE BY MOUTH EVERY DAY, Disp: 90 capsule, Rfl: 2   Semaglutide, 1 MG/DOSE, 4 MG/3ML SOPN, Inject 1 mg into the skin once a week., Disp: 3 mL, Rfl: 2   sodium bicarbonate 650 MG tablet, TAKE 1 TABLET BY MOUTH EVERY DAY, Disp: 90 tablet, Rfl: 3   tiZANidine (ZANAFLEX) 4 MG tablet, Take 1 tablet (4 mg total) by mouth every 6 (six) hours as needed for muscle spasms., Disp: 40 tablet, Rfl: 0   triamcinolone cream (KENALOG) 0.1 %, Apply 1 application topically 2 (two) times daily. Apply, Disp: 45 g, Rfl: 1   famotidine (HM FAMOTIDINE) 10 MG tablet, Take 1 tablet (10 mg total) by mouth daily., Disp: 90 tablet, Rfl: 1   olmesartan (BENICAR) 40 MG tablet, Take 1 tablet (40 mg total) by mouth daily., Disp: 90 tablet, Rfl: 2   oxyCODONE-acetaminophen (PERCOCET) 5-325 MG tablet, Take 1 tablet by mouth every 4 (four) hours as needed for severe pain (pain score 7-10)., Disp: 20 tablet, Rfl: 0   No Known Allergies   Review of Systems  Constitutional: Negative.   Respiratory: Negative.    Cardiovascular: Negative.   Gastrointestinal: Negative.   Musculoskeletal:  Positive for back pain.  Neurological: Negative.   Psychiatric/Behavioral: Negative.       Today's Vitals   04/20/23 1600 04/20/23 1645  BP: (!) 150/84 (!) 148/80  Pulse: 90   Temp: 98 F (36.7 C)   SpO2: 98%   Weight: 222 lb 9.6 oz (101 kg)   Height: 5\' 1"  (1.549 m)    Body mass index is 42.06 kg/m.  Wt Readings from Last 3 Encounters:  04/20/23 222 lb 9.6 oz (101 kg)  04/15/23  229 lb 0.9 oz (103.9 kg)  04/08/23 229 lb (103.9 kg)     Objective:  Physical Exam Vitals and nursing note reviewed.  Constitutional:      Appearance: Normal appearance. She is obese.  HENT:     Head: Normocephalic and atraumatic.  Eyes:     Extraocular Movements:  Extraocular movements intact.  Cardiovascular:     Rate and Rhythm: Normal rate and regular rhythm.     Heart sounds: Normal heart sounds.  Pulmonary:     Effort: Pulmonary effort is normal.     Breath sounds: Normal breath sounds.  Musculoskeletal:     Cervical back: Normal range of motion.     Comments: Ambulatory with cane  Skin:    General: Skin is warm.  Neurological:     General: No focal deficit present.     Mental Status: She is alert.  Psychiatric:        Mood and Affect: Mood normal.        Behavior: Behavior normal.         Assessment And Plan:  Spinal stenosis of lumbar region with neurogenic claudication Assessment & Plan: Chronic, ER records reviewed. PDMP reviewed. I will refer her to Pain Mgmt for further evaluation. She agrees to treatment plan. I will also refill rx for oxycodone. She is encouraged to participate fully in physical therapy and to perform exercises at home.   Orders: -     Ambulatory referral to Orthopedic Surgery  Parenchymal renal hypertension, stage 1 through stage 4 or unspecified chronic kidney disease Assessment & Plan: Chronic, uncontrolled.  Goal BP<120/80.  Her BP elevation is likely exacerbated by pain. No med changes today. She will continue with amlodipine 10mg  daily, propranolol ER 120mg  daily, olmesartan 40mg  daily and chlorthalidone 25mg  daily. Encouraged to follow low sodium diet.    Stage 3a chronic kidney disease (HCC) Assessment & Plan: Chronic, she is encouraged to keep BP well controlled and to stay well hydrated to decrease risk of CKD progression.    Orders: -     Renal function panel -     Hemoglobin -     Microalbumin / creatinine urine  ratio  Class 3 severe obesity due to excess calories with serious comorbidity and body mass index (BMI) of 40.0 to 44.9 in adult [facility]) Assessment & Plan: BMI 42.  She is encouraged to initially strive for BMI less than 35 to decrease cardiac risk. Advised to aim for at least 150 minutes of exercise per week.    Other orders -     oxyCODONE-Acetaminophen; Take 1 tablet by mouth every 4 (four) hours as needed for severe pain (pain score 7-10).  Dispense: 20 tablet; Refill: 0  She is encouraged to strive for BMI less than 30 to decrease cardiac risk. Advised to aim for at least 150 minutes of exercise per week.    Return if symptoms worsen or fail to improve, for 4 month bp check.  Patient was given opportunity to ask questions. Patient verbalized understanding of the plan and was able to repeat key elements of the plan. All questions were answered to their satisfaction.    I, Teresa Aliment, MD, have reviewed all documentation for this visit. The documentation on 04/25/23 for the exam, diagnosis, procedures, and orders are all accurate and complete.   IF YOU HAVE BEEN REFERRED TO A SPECIALIST, IT MAY TAKE 1-2 WEEKS TO SCHEDULE/PROCESS THE REFERRAL. IF YOU HAVE NOT HEARD FROM US/SPECIALIST IN TWO WEEKS, PLEASE GIVE [place] A CALL AT [phone] X [phone].   THE PATIENT IS ENCOURAGED TO PRACTICE SOCIAL DISTANCING DUE TO THE COVID-19 PANDEMIC.

## 2023-04-21 ENCOUNTER — Ambulatory Visit: Payer: Medicare Other | Attending: Family Medicine

## 2023-04-21 ENCOUNTER — Other Ambulatory Visit: Payer: Self-pay

## 2023-04-21 DIAGNOSIS — M5441 Lumbago with sciatica, right side: Secondary | ICD-10-CM | POA: Diagnosis not present

## 2023-04-21 DIAGNOSIS — M6281 Muscle weakness (generalized): Secondary | ICD-10-CM | POA: Diagnosis not present

## 2023-04-21 DIAGNOSIS — M5442 Lumbago with sciatica, left side: Secondary | ICD-10-CM | POA: Diagnosis not present

## 2023-04-21 DIAGNOSIS — M5459 Other low back pain: Secondary | ICD-10-CM | POA: Diagnosis not present

## 2023-04-21 DIAGNOSIS — M48061 Spinal stenosis, lumbar region without neurogenic claudication: Secondary | ICD-10-CM | POA: Insufficient documentation

## 2023-04-21 LAB — RENAL FUNCTION PANEL
Albumin: 4.5 g/dL (ref 3.8–4.8)
BUN/Creatinine Ratio: 22 (ref 12–28)
BUN: 44 mg/dL — ABNORMAL HIGH (ref 8–27)
CO2: 24 mmol/L (ref 20–29)
Calcium: 10 mg/dL (ref 8.7–10.3)
Chloride: 100 mmol/L (ref 96–106)
Creatinine, Ser: 1.97 mg/dL — ABNORMAL HIGH (ref 0.57–1.00)
Glucose: 124 mg/dL — ABNORMAL HIGH (ref 70–99)
Phosphorus: 3.8 mg/dL (ref 3.0–4.3)
Potassium: 5 mmol/L (ref 3.5–5.2)
Sodium: 141 mmol/L (ref 134–144)
eGFR: 26 mL/min/{1.73_m2} — ABNORMAL LOW (ref >59)

## 2023-04-21 LAB — MICROALBUMIN / CREATININE URINE RATIO
Creatinine, Urine: 63.6 mg/dL
Microalb/Creat Ratio: 232 mg/g{creat} — ABNORMAL HIGH (ref 0–29)
Microalbumin, Urine: 147.7 ug/mL

## 2023-04-21 LAB — HEMOGLOBIN: Hemoglobin: 13.2 g/dL (ref 11.1–15.9)

## 2023-04-22 ENCOUNTER — Encounter: Payer: Self-pay | Admitting: Internal Medicine

## 2023-04-22 ENCOUNTER — Ambulatory Visit: Admitting: Family Medicine

## 2023-04-25 DIAGNOSIS — N1831 Chronic kidney disease, stage 3a: Secondary | ICD-10-CM | POA: Insufficient documentation

## 2023-04-25 DIAGNOSIS — M48062 Spinal stenosis, lumbar region with neurogenic claudication: Secondary | ICD-10-CM | POA: Insufficient documentation

## 2023-04-25 NOTE — Assessment & Plan Note (Signed)
BMI 42. She is encouraged to initially strive for BMI less than 35 to decrease cardiac risk. Advised to aim for at least 150 minutes of exercise per week.

## 2023-04-25 NOTE — Assessment & Plan Note (Signed)
 Chronic, uncontrolled.  Goal BP<120/80.  Her BP elevation is likely exacerbated by pain. No med changes today. She will continue with amlodipine 10mg  daily, propranolol ER 120mg  daily, olmesartan 40mg  daily and chlorthalidone 25mg  daily. Encouraged to follow low sodium diet.

## 2023-04-25 NOTE — Assessment & Plan Note (Signed)
 Chronic, ER records reviewed. PDMP reviewed. I will refer her to Pain Mgmt for further evaluation. She agrees to treatment plan. I will also refill rx for oxycodone. She is encouraged to participate fully in physical therapy and to perform exercises at home.

## 2023-04-25 NOTE — Assessment & Plan Note (Signed)
 Chronic, she is encouraged to keep BP well controlled and to stay well hydrated to decrease risk of CKD progression.

## 2023-04-27 ENCOUNTER — Ambulatory Visit: Admitting: Physical Therapy

## 2023-04-27 DIAGNOSIS — M5441 Lumbago with sciatica, right side: Secondary | ICD-10-CM | POA: Diagnosis not present

## 2023-04-27 DIAGNOSIS — M6281 Muscle weakness (generalized): Secondary | ICD-10-CM | POA: Diagnosis not present

## 2023-04-27 DIAGNOSIS — M48061 Spinal stenosis, lumbar region without neurogenic claudication: Secondary | ICD-10-CM

## 2023-04-27 DIAGNOSIS — M5459 Other low back pain: Secondary | ICD-10-CM | POA: Diagnosis not present

## 2023-04-27 DIAGNOSIS — M5442 Lumbago with sciatica, left side: Secondary | ICD-10-CM | POA: Diagnosis not present

## 2023-04-27 NOTE — Therapy (Signed)
 OUTPATIENT PHYSICAL THERAPY THORACOLUMBAR EVALUATION   Patient Name: Teresa Stanley MRN: [id number] DOB:Dec 12, 1945, 78 y.o., female Today's Date: 04/27/2023  END OF SESSION:  PT End of Session - 04/27/23 1129     Visit Number 2    Number of Visits 7    Date for PT Re-Evaluation 06/21/23    Authorization - Visit Number 1    Authorization - Number of Visits 6    PT Start Time 1130    PT Stop Time 1210    PT Time Calculation (min) 40 min    Activity Tolerance Patient tolerated treatment well    Behavior During Therapy WFL for tasks assessed/performed              Past Medical History:  Diagnosis Date   Arthritis    Diabetes mellitus    takes Actos and Metformin daily and Victoza   GERD (gastroesophageal reflux disease)    takes Omeprazole daily   Headache(784.0)    occasionally   Hyperlipidemia    takes Atorvastatin daily   Hypertension    takes Lisinopril,Amlodipine,and Metoprolol daily   Hypothyroidism    Joint pain    Thyroid disease    ?   Past Surgical History:  Procedure Laterality Date   ABDOMINAL HYSTERECTOMY     BREAST BIOPSY Left    BREAST EXCISIONAL BIOPSY Left    CARDIAC CATHETERIZATION  2009   CATARACT EXTRACTION Right    EYE SURGERY     JOINT REPLACEMENT     KNEE ARTHROPLASTY Left 04/04/2013   Procedure: COMPUTER ASSISTED TOTAL KNEE ARTHROPLASTY;  Surgeon: Eldred Manges, MD;  Location: [facility] OR;  Service: Orthopedics;  Laterality: Left;  Left Total Knee Arthroplasty, Cemented   TOTAL KNEE ARTHROPLASTY Right 11/26/2015   Procedure: TOTAL KNEE ARTHROPLASTY;  Surgeon: Eldred Manges, MD;  Location: [facility] OR;  Service: Orthopedics;  Laterality: Right;   Patient Active Problem List   Diagnosis Date Noted   Spinal stenosis of lumbar region with neurogenic claudication 04/25/2023   Stage 3a chronic kidney disease (HCC) 04/25/2023   Viral upper respiratory tract infection 03/29/2023   Discomfort of right ear 11/30/2022   Mixed hyperlipidemia 04/06/2022    Class 3 severe obesity due to excess calories with serious comorbidity and body mass index (BMI) of 40.0 to 44.9 in adult (HCC) 05/28/2018   Parenchymal renal hypertension 08/11/2017   Right knee DJD 11/26/2015   S/P total knee arthroplasty 04/18/2013   Unilateral primary osteoarthritis, right knee 04/04/2013    Class: Diagnosis of   Candidal intertrigo 08/09/2012   Hypertension 12/15/2011   Type 2 diabetes mellitus with stage 3b chronic kidney disease, with long-term current use of insulin (HCC) 12/15/2011    PCP: Dorothyann Peng, MD   REFERRING PROVIDER: Ellender Hose, NP  REFERRING DIAG: [phone] (ICD-10-CM) - Acute bilateral low back pain with bilateral sciatica  Rationale for Evaluation and Treatment: Rehabilitation  THERAPY DIAG:  Other low back pain  Spinal stenosis of lumbar region, unspecified whether neurogenic claudication present  Muscle weakness (generalized)  ONSET DATE: acute on chronic  SUBJECTIVE:  SUBJECTIVE STATEMENT: Pt stated that she's a little sore today, 7/10 pain. Feels like the exercises at home are helping overall. Usually wears a patch on the back that seems to help, however, isn't wearing one today.   Eval statement 04/21/2023: Patient is a 78 year old female who  presents today for lower back pain that goes down her legs. Patient reports the back pain first started about 03/19/2023 and  reports that it  got better then started back again on Monday. Patient reports she was treated with oxycodone and muscle relaxer when she initially started hurting.  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 Her back is not tender to touch but hurts when she leans forward and backwards. Patient rates her pain 7/10 when she is up walking, she is here with her  daughter.  PERTINENT HISTORY:  Patient had bilateral knee replacement in 2015 and 2017 respectively. Her back is not tender to touch but hurts when she leans forward and backwards. Patient rates her pain 7/10 when she is up walking, she is here with her daughter.  PAIN:  Are you having pain? Yes: NPRS scale: 9/10 Pain location: low back  Pain description: ache Aggravating factors: walking and weight bearing tasks Relieving factors: sitting, heat and   PRECAUTIONS: None  RED FLAGS: None   WEIGHT BEARING RESTRICTIONS: No  FALLS:  Has patient fallen in last 6 months? No  OCCUPATION: not working  PLOF: Independent  PATIENT GOALS: To manage my back pain  NEXT MD VISIT: TBD  OBJECTIVE:  Note: Objective measures were completed at Evaluation unless otherwise noted.  DIAGNOSTIC FINDINGS:  IMPRESSION: Degenerative changes of the lumbar spine as above. Severe spinal canal stenosis at L4-5. Additional moderate spinal canal stenosis at L1-2 and L3-4.   Multilevel foraminal stenosis, greatest and severe at L5-S1.   Degenerative changes of the sacroiliac joints.   0.9 cm hyperattenuating focus within the right kidney which could reflect a cyst with hemorrhagic or proteinaceous debris. Consider renal ultrasound for further evaluation.     Electronically Signed   By: Emily Filbert M.D.   On: 04/15/2023 11:29  PATIENT SURVEYS:  Modified Oswestry 23/50 46% perceived disability   MUSCLE LENGTH: Hamstrings: Right 70 deg; Left 70 deg   POSTURE: rounded shoulders trunk shift to R  PALPATION: deferred  LUMBAR ROM:   AROM eval  Flexion 90%  Extension 0%  Right lateral flexion 75%  Left lateral flexion 75%  Right rotation 50%  Left rotation 25%   (Blank rows = not tested)  LOWER EXTREMITY ROM:   WFL for gait and transfers  Active  Right eval Left eval  Hip flexion    Hip extension    Hip abduction    Hip adduction    Hip internal rotation    Hip external  rotation    Knee flexion    Knee extension    Ankle dorsiflexion    Ankle plantarflexion    Ankle inversion    Ankle eversion     (Blank rows = not tested)  LOWER EXTREMITY MMT:    MMT Right eval Left eval  Hip flexion    Hip extension 4- 4-  Hip abduction 4- 4-  Hip adduction    Hip internal rotation    Hip external rotation    Knee flexion    Knee extension 4- 4-  Ankle dorsiflexion    Ankle plantarflexion 4- 4-  Ankle inversion    Ankle eversion     (  Blank rows = not tested)  LUMBAR SPECIAL TESTS:  Straight leg raise test: Negative and Slump test: Negative  FUNCTIONAL TESTS:  30 seconds chair stand test 7 reps with UE assist  GAIT: Distance walked: 77ft x2 Assistive device utilized: Single point cane Level of assistance: Modified independence Comments: slow cadence an antalgic gait  [facility] Adult PT Treatment:                                                DATE: 04/27/2023  Therapeutic Exercise: NuStep 5' Hamstring stretch 2x1' Figure 4 stretch 2x1' ea. Therapeutic Activity: Supine March 2x10 ea. Seated trunk rotations 2x10 ea. Cue pt to tap back pocket STS 2x12, one arm UE during stance, no UE on descent  [facility] Adult PT Treatment:                                                DATE: 04/21/23  Self Care: Additional minutes spent for educating on updated Therapeutic Home Exercise Program as well as comparing current status to condition at start of care. This included exercises focusing on stretching, strengthening, with focus on eccentric aspects. Long term goals include an improvement in range of motion, strength, endurance as well as avoiding reinjury. Patient's frequency would include in 1-2 times a day, 3-5 times a week for a duration of 6-12 weeks. Proper technique shown and discussed handout in great detail. All questions were discussed and addressed.                                                                                                                              PATIENT EDUCATION:  Education details: Discussed eval findings, rehab rationale and POC and patient is in agreement  Person educated: Patient Education method: Explanation Education comprehension: verbalized understanding and needs further education  HOME EXERCISE PROGRAM: Access Code: [email or web address] URL: [email or web address] Date: 04/21/2023 Prepared by: Gustavus Bryant  Exercises - Sit to Stand with Armchair  - 1 x daily - 5 x weekly - 1 sets - 5 reps - Seated Long Arc Quad  - 1 x daily - 5 x weekly - 2 sets - 10 reps - Heel Toe Raises with Unilateral Counter Support  - 1 x daily - 5 x weekly - 2 sets - 10 reps  ASSESSMENT:  CLINICAL IMPRESSION: Pt attended physical therapy session for continuation of treatment regarding LBP. Today's treatment focused on improvement of  lumbar/hip motility, global hip strength, core strength/stability and dynamic activation patterns. Pt showed good tolerance to treatment and demonstrated improvement with lumbar/hip motility and global hip strength. Some difficulties continue with understanding core activation patterns. Pt required moderate verbal/tactile cuing alongside no physical assistance for  safe and appropriate performance of today's activations. Continue with therapeutic focus on core activation patterns, lumbar/hip motility, global hip strength, and dynamic core strength.    Eval impression 04/21/2023: Patient is a 78 y.o. female who was seen today for physical therapy evaluation and treatment for chronic low back pain due to degenerative changes and underlying stenosis.  Patient demonstrates decreased trunk mobility especially extension.  LE strength deficits identified on 30s chair stand test.  Nerve tensioning did not elicit symptoms.  Discussed possibility of aquatic PT and will consider.  OBJECTIVE IMPAIRMENTS: Abnormal gait, decreased activity tolerance, decreased knowledge of condition, decreased mobility,  difficulty walking, decreased strength, impaired perceived functional ability, improper body mechanics, postural dysfunction, obesity, and pain.   ACTIVITY LIMITATIONS: carrying, lifting, bending, sitting, standing, squatting, stairs, and transfers  PERSONAL FACTORS: Age, Fitness, Past/current experiences, and Time since onset of injury/illness/exacerbation are also affecting patient's functional outcome.   REHAB POTENTIAL: Fair based on chronicity and diagnosis  CLINICAL DECISION MAKING: Stable/uncomplicated  EVALUATION COMPLEXITY: Low   GOALS: Goals reviewed with patient? No  SHORT TERM GOALS: Target date: 05/12/2023  Patient to demonstrate independence in HEP  Baseline: Access Code: [email or web address] Goal status: INITIAL  2.  Assess 2 MWT test Baseline: TBD Goal status: INITIAL   LONG TERM GOALS: Target date: 06/02/2023    Patient will acknowledge 6/10 worst pain at least once during episode of care   Baseline: 9/10 Goal status: INITIAL  2.  Patient will score at least 33% on FOTO to signify clinically meaningful improvement in functional abilities.   Baseline: 46% Goal status: INITIAL  3.  Patient will increase 30s chair stand reps from 7 to 9 with/without arms to demonstrate and improved functional ability with less pain/difficulty as well as reduce fall risk.  Baseline: 7 Goal status: INITIAL  4.  Increase BLE strength to 4/5 Baseline:  MMT Right eval Left eval  Hip flexion    Hip extension 4- 4-  Hip abduction 4- 4-  Hip adduction    Hip internal rotation    Hip external rotation    Knee flexion    Knee extension 4- 4-  Ankle dorsiflexion    Ankle plantarflexion 4- 4-   Goal status: INITIAL  5.  Assess progress on 2 MWT Baseline: TBD Goal status: INITIAL    PLAN:  PT FREQUENCY: 2x/week  PT DURATION: 6 weeks  PLANNED INTERVENTIONS: 97164- PT Re-evaluation, 97110-Therapeutic exercises, 97530- Therapeutic activity, 97112- Neuromuscular re-education,  97535- Self Care, 16109- Manual therapy, [phone]- Gait training, [phone]- Aquatic Therapy, Patient/Family education, Dry Needling, Joint mobilization, and Spinal mobilization.  PLAN FOR NEXT SESSION: Continue with therapeutic focus on core activation patterns, lumbar/hip motility, global hip strength, and dynamic core strength.  Sheliah Plane, PT, DPT 04/27/2023, 12:11 PM

## 2023-04-28 DIAGNOSIS — E1122 Type 2 diabetes mellitus with diabetic chronic kidney disease: Secondary | ICD-10-CM | POA: Diagnosis not present

## 2023-04-28 DIAGNOSIS — D631 Anemia in chronic kidney disease: Secondary | ICD-10-CM | POA: Diagnosis not present

## 2023-04-28 DIAGNOSIS — I129 Hypertensive chronic kidney disease with stage 1 through stage 4 chronic kidney disease, or unspecified chronic kidney disease: Secondary | ICD-10-CM | POA: Diagnosis not present

## 2023-04-28 DIAGNOSIS — N189 Chronic kidney disease, unspecified: Secondary | ICD-10-CM | POA: Diagnosis not present

## 2023-04-28 DIAGNOSIS — R809 Proteinuria, unspecified: Secondary | ICD-10-CM | POA: Diagnosis not present

## 2023-04-28 DIAGNOSIS — N184 Chronic kidney disease, stage 4 (severe): Secondary | ICD-10-CM | POA: Diagnosis not present

## 2023-04-28 DIAGNOSIS — E872 Acidosis, unspecified: Secondary | ICD-10-CM | POA: Diagnosis not present

## 2023-04-29 ENCOUNTER — Encounter: Payer: Self-pay | Admitting: Internal Medicine

## 2023-04-29 ENCOUNTER — Ambulatory Visit
Admission: RE | Admit: 2023-04-29 | Discharge: 2023-04-29 | Disposition: A | Discharge status: Home / Self Care | Payer: Medicare Other | Source: Ambulatory Visit | Attending: Internal Medicine | Admitting: Internal Medicine

## 2023-04-29 ENCOUNTER — Other Ambulatory Visit: Payer: Self-pay | Admitting: Internal Medicine

## 2023-04-29 DIAGNOSIS — M8588 Other specified disorders of bone density and structure, other site: Secondary | ICD-10-CM | POA: Diagnosis not present

## 2023-04-29 DIAGNOSIS — E2839 Other primary ovarian failure: Secondary | ICD-10-CM

## 2023-04-29 MED ORDER — OXYCODONE-ACETAMINOPHEN 5-325 MG PO TABS: 1.0000 | ORAL_TABLET | ORAL | 0 refills | Status: DC | PRN

## 2023-04-29 NOTE — Therapy (Deleted)
 OUTPATIENT PHYSICAL THERAPY THORACOLUMBAR EVALUATION   Patient Name: Teresa Stanley MRN: [id number] DOB:[birth year], 78 y.o., female Today's Date: 04/29/2023  END OF SESSION:     Past Medical History:  Diagnosis Date   Arthritis    Diabetes mellitus    takes Actos and Metformin daily and Victoza   GERD (gastroesophageal reflux disease)    takes Omeprazole daily   Headache(784.0)    occasionally   Hyperlipidemia    takes Atorvastatin daily   Hypertension    takes Lisinopril,Amlodipine,and Metoprolol daily   Hypothyroidism    Joint pain    Thyroid disease    ?   Past Surgical History:  Procedure Laterality Date   ABDOMINAL HYSTERECTOMY     BREAST BIOPSY Left    BREAST EXCISIONAL BIOPSY Left    CARDIAC CATHETERIZATION  2009   CATARACT EXTRACTION Right    EYE SURGERY     JOINT REPLACEMENT     KNEE ARTHROPLASTY Left 04/04/2013   Procedure: COMPUTER ASSISTED TOTAL KNEE ARTHROPLASTY;  Surgeon: Eldred Manges, MD;  Location: [facility] OR;  Service: Orthopedics;  Laterality: Left;  Left Total Knee Arthroplasty, Cemented   TOTAL KNEE ARTHROPLASTY Right 11/26/2015   Procedure: TOTAL KNEE ARTHROPLASTY;  Surgeon: Eldred Manges, MD;  Location: [facility] OR;  Service: Orthopedics;  Laterality: Right;   Patient Active Problem List   Diagnosis Date Noted   Spinal stenosis of lumbar region with neurogenic claudication 04/25/2023   Stage 3a chronic kidney disease (HCC) 04/25/2023   Viral upper respiratory tract infection 03/29/2023   Discomfort of right ear 11/30/2022   Mixed hyperlipidemia 04/06/2022   Class 3 severe obesity due to excess calories with serious comorbidity and body mass index (BMI) of 40.0 to 44.9 in adult (HCC) 05/28/2018   Parenchymal renal hypertension 08/11/2017   Right knee DJD 11/26/2015   S/P total knee arthroplasty 04/18/2013   Unilateral primary osteoarthritis, right knee 04/04/2013    Class: Diagnosis of   Candidal intertrigo 08/09/2012   Hypertension 12/15/2011   Type  2 diabetes mellitus with stage 3b chronic kidney disease, with long-term current use of insulin (HCC) 12/15/2011    PCP: Dorothyann Peng, MD   REFERRING PROVIDER: Ellender Hose, NP  REFERRING DIAG: [phone] (ICD-10-CM) - Acute bilateral low back pain with bilateral sciatica  Rationale for Evaluation and Treatment: Rehabilitation  THERAPY DIAG:  No diagnosis found.  ONSET DATE: acute on chronic  SUBJECTIVE:  SUBJECTIVE STATEMENT: Pt stated that she's a little sore today, 7/10 pain. Feels like the exercises at home are helping overall. Usually wears a patch on the back that seems to help, however, isn't wearing one today.   Eval statement 04/21/2023: Patient is a 78 year old female who  presents today for lower back pain that goes down her legs. Patient reports the back pain first started about 03/19/2023 and  reports that it  got better then started back again on Monday. Patient reports she was treated with oxycodone and muscle relaxer when she initially started hurting.  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 Her back is not tender to touch but hurts when she leans forward and backwards. Patient rates her pain 7/10 when she is up walking, she is here with her daughter.  PERTINENT HISTORY:  Patient had bilateral knee replacement in 2015 and 2017 respectively. Her back is not tender to touch but hurts when she leans forward and backwards. Patient rates her pain 7/10 when she is up walking, she is here with her daughter.  PAIN:  Are you having pain? Yes: NPRS scale: 9/10 Pain location: low back  Pain description: ache Aggravating factors: walking and weight bearing tasks Relieving factors: sitting, heat and   PRECAUTIONS: None  RED FLAGS: None   WEIGHT BEARING  RESTRICTIONS: No  FALLS:  Has patient fallen in last 6 months? No  OCCUPATION: not working  PLOF: Independent  PATIENT GOALS: To manage my back pain  NEXT MD VISIT: TBD  OBJECTIVE:  Note: Objective measures were completed at Evaluation unless otherwise noted.  DIAGNOSTIC FINDINGS:  IMPRESSION: Degenerative changes of the lumbar spine as above. Severe spinal canal stenosis at L4-5. Additional moderate spinal canal stenosis at L1-2 and L3-4.   Multilevel foraminal stenosis, greatest and severe at L5-S1.   Degenerative changes of the sacroiliac joints.   0.9 cm hyperattenuating focus within the right kidney which could reflect a cyst with hemorrhagic or proteinaceous debris. Consider renal ultrasound for further evaluation.     Electronically Signed   By: Emily Filbert M.D.   On: 04/15/2023 11:29  PATIENT SURVEYS:  Modified Oswestry 23/50 46% perceived disability   MUSCLE LENGTH: Hamstrings: Right 70 deg; Left 70 deg   POSTURE: rounded shoulders trunk shift to R  PALPATION: deferred  LUMBAR ROM:   AROM eval  Flexion 90%  Extension 0%  Right lateral flexion 75%  Left lateral flexion 75%  Right rotation 50%  Left rotation 25%   (Blank rows = not tested)  LOWER EXTREMITY ROM:   WFL for gait and transfers  Active  Right eval Left eval  Hip flexion    Hip extension    Hip abduction    Hip adduction    Hip internal rotation    Hip external rotation    Knee flexion    Knee extension    Ankle dorsiflexion    Ankle plantarflexion    Ankle inversion    Ankle eversion     (Blank rows = not tested)  LOWER EXTREMITY MMT:    MMT Right eval Left eval  Hip flexion    Hip extension 4- 4-  Hip abduction 4- 4-  Hip adduction    Hip internal rotation    Hip external rotation    Knee flexion    Knee extension 4- 4-  Ankle dorsiflexion    Ankle plantarflexion 4- 4-  Ankle inversion    Ankle eversion     (  Blank rows = not tested)  LUMBAR SPECIAL  TESTS:  Straight leg raise test: Negative and Slump test: Negative  FUNCTIONAL TESTS:  30 seconds chair stand test 7 reps with UE assist  GAIT: Distance walked: 65ft x2 Assistive device utilized: Single point cane Level of assistance: Modified independence Comments: slow cadence an antalgic gait  [facility] Adult PT Treatment:                                                DATE: 04/27/2023  Therapeutic Exercise: NuStep 5' Hamstring stretch 2x1' Figure 4 stretch 2x1' ea. Therapeutic Activity: Supine March 2x10 ea. Seated trunk rotations 2x10 ea. Cue pt to tap back pocket STS 2x12, one arm UE during stance, no UE on descent  [facility] Adult PT Treatment:                                                DATE: 04/21/23  Self Care: Additional minutes spent for educating on updated Therapeutic Home Exercise Program as well as comparing current status to condition at start of care. This included exercises focusing on stretching, strengthening, with focus on eccentric aspects. Long term goals include an improvement in range of motion, strength, endurance as well as avoiding reinjury. Patient's frequency would include in 1-2 times a day, 3-5 times a week for a duration of 6-12 weeks. Proper technique shown and discussed handout in great detail. All questions were discussed and addressed.                                                                                                                             PATIENT EDUCATION:  Education details: Discussed eval findings, rehab rationale and POC and patient is in agreement  Person educated: Patient Education method: Explanation Education comprehension: verbalized understanding and needs further education  HOME EXERCISE PROGRAM: Access Code: [email or web address] URL: [email or web address] Date: 04/21/2023 Prepared by: Gustavus Bryant  Exercises - Sit to Stand with Armchair  - 1 x daily - 5 x weekly - 1 sets - 5 reps - Seated Long Arc Quad  -  1 x daily - 5 x weekly - 2 sets - 10 reps - Heel Toe Raises with Unilateral Counter Support  - 1 x daily - 5 x weekly - 2 sets - 10 reps  ASSESSMENT:  CLINICAL IMPRESSION: Pt attended physical therapy session for continuation of treatment regarding LBP. Today's treatment focused on improvement of  lumbar/hip motility, global hip strength, core strength/stability and dynamic activation patterns. Pt showed good tolerance to treatment and demonstrated improvement with lumbar/hip motility and global hip strength. Some difficulties continue with understanding core activation patterns. Pt required moderate verbal/tactile cuing alongside no physical assistance  for safe and appropriate performance of today's activations. Continue with therapeutic focus on core activation patterns, lumbar/hip motility, global hip strength, and dynamic core strength.    Eval impression 04/21/2023: Patient is a 78 y.o. female who was seen today for physical therapy evaluation and treatment for chronic low back pain due to degenerative changes and underlying stenosis.  Patient demonstrates decreased trunk mobility especially extension.  LE strength deficits identified on 30s chair stand test.  Nerve tensioning did not elicit symptoms.  Discussed possibility of aquatic PT and will consider.  OBJECTIVE IMPAIRMENTS: Abnormal gait, decreased activity tolerance, decreased knowledge of condition, decreased mobility, difficulty walking, decreased strength, impaired perceived functional ability, improper body mechanics, postural dysfunction, obesity, and pain.   ACTIVITY LIMITATIONS: carrying, lifting, bending, sitting, standing, squatting, stairs, and transfers  PERSONAL FACTORS: Age, Fitness, Past/current experiences, and Time since onset of injury/illness/exacerbation are also affecting patient's functional outcome.   REHAB POTENTIAL: Fair based on chronicity and diagnosis  CLINICAL DECISION MAKING:  Stable/uncomplicated  EVALUATION COMPLEXITY: Low   GOALS: Goals reviewed with patient? No  SHORT TERM GOALS: Target date: 05/12/2023  Patient to demonstrate independence in HEP  Baseline: Access Code: [email or web address] Goal status: INITIAL  2.  Assess 2 MWT test Baseline: TBD Goal status: INITIAL   LONG TERM GOALS: Target date: 06/02/2023    Patient will acknowledge 6/10 worst pain at least once during episode of care   Baseline: 9/10 Goal status: INITIAL  2.  Patient will score at least 33% on FOTO to signify clinically meaningful improvement in functional abilities.   Baseline: 46% Goal status: INITIAL  3.  Patient will increase 30s chair stand reps from 7 to 9 with/without arms to demonstrate and improved functional ability with less pain/difficulty as well as reduce fall risk.  Baseline: 7 Goal status: INITIAL  4.  Increase BLE strength to 4/5 Baseline:  MMT Right eval Left eval  Hip flexion    Hip extension 4- 4-  Hip abduction 4- 4-  Hip adduction    Hip internal rotation    Hip external rotation    Knee flexion    Knee extension 4- 4-  Ankle dorsiflexion    Ankle plantarflexion 4- 4-   Goal status: INITIAL  5.  Assess progress on 2 MWT Baseline: TBD Goal status: INITIAL    PLAN:  PT FREQUENCY: 2x/week  PT DURATION: 6 weeks  PLANNED INTERVENTIONS: 97164- PT Re-evaluation, 97110-Therapeutic exercises, 97530- Therapeutic activity, 97112- Neuromuscular re-education, 97535- Self Care, 16606- Manual therapy, [phone]- Gait training, [phone]- Aquatic Therapy, Patient/Family education, Dry Needling, Joint mobilization, and Spinal mobilization.  PLAN FOR NEXT SESSION: Continue with therapeutic focus on core activation patterns, lumbar/hip motility, global hip strength, and dynamic core strength.  Sheliah Plane, PT, DPT 04/29/2023, 4:23 PM

## 2023-05-01 ENCOUNTER — Ambulatory Visit

## 2023-05-04 ENCOUNTER — Ambulatory Visit: Admitting: Physical Therapy

## 2023-05-06 ENCOUNTER — Ambulatory Visit

## 2023-05-06 DIAGNOSIS — M5459 Other low back pain: Secondary | ICD-10-CM

## 2023-05-06 DIAGNOSIS — M5442 Lumbago with sciatica, left side: Secondary | ICD-10-CM | POA: Diagnosis not present

## 2023-05-06 DIAGNOSIS — M48061 Spinal stenosis, lumbar region without neurogenic claudication: Secondary | ICD-10-CM | POA: Diagnosis not present

## 2023-05-06 DIAGNOSIS — M6281 Muscle weakness (generalized): Secondary | ICD-10-CM | POA: Diagnosis not present

## 2023-05-06 DIAGNOSIS — M5441 Lumbago with sciatica, right side: Secondary | ICD-10-CM | POA: Diagnosis not present

## 2023-05-06 NOTE — Therapy (Signed)
 OUTPATIENT PHYSICAL THERAPY TREATMENT NOTE   Patient Name: Teresa Stanley MRN: [id number] DOB:10-Aug-1945, 78 y.o., female Today's Date: 05/06/2023  END OF SESSION:  PT End of Session - 05/06/23 1127     Visit Number 3    Number of Visits 7    Date for PT Re-Evaluation 06/21/23    Authorization Type [employer] MCR    Authorization Time Period 6 visits 04/21/23-06/16/23    Authorization - Visit Number 2    Authorization - Number of Visits 6    PT Start Time 1130    PT Stop Time 1208    PT Time Calculation (min) 38 min    Activity Tolerance Patient tolerated treatment well    Behavior During Therapy WFL for tasks assessed/performed             Past Medical History:  Diagnosis Date   Arthritis    Diabetes mellitus    takes Actos and Metformin daily and Victoza   GERD (gastroesophageal reflux disease)    takes Omeprazole daily   Headache(784.0)    occasionally   Hyperlipidemia    takes Atorvastatin daily   Hypertension    takes Lisinopril,Amlodipine,and Metoprolol daily   Hypothyroidism    Joint pain    Thyroid disease    ?   Past Surgical History:  Procedure Laterality Date   ABDOMINAL HYSTERECTOMY     BREAST BIOPSY Left    BREAST EXCISIONAL BIOPSY Left    CARDIAC CATHETERIZATION  2009   CATARACT EXTRACTION Right    EYE SURGERY     JOINT REPLACEMENT     KNEE ARTHROPLASTY Left 04/04/2013   Procedure: COMPUTER ASSISTED TOTAL KNEE ARTHROPLASTY;  Surgeon: Eldred Manges, MD;  Location: [facility] OR;  Service: Orthopedics;  Laterality: Left;  Left Total Knee Arthroplasty, Cemented   TOTAL KNEE ARTHROPLASTY Right 11/26/2015   Procedure: TOTAL KNEE ARTHROPLASTY;  Surgeon: Eldred Manges, MD;  Location: [facility] OR;  Service: Orthopedics;  Laterality: Right;   Patient Active Problem List   Diagnosis Date Noted   Spinal stenosis of lumbar region with neurogenic claudication 04/25/2023   Stage 3a chronic kidney disease (HCC) 04/25/2023   Viral upper respiratory tract infection 03/29/2023    Discomfort of right ear 11/30/2022   Mixed hyperlipidemia 04/06/2022   Class 3 severe obesity due to excess calories with serious comorbidity and body mass index (BMI) of 40.0 to 44.9 in adult (HCC) 05/28/2018   Parenchymal renal hypertension 08/11/2017   Right knee DJD 11/26/2015   S/P total knee arthroplasty 04/18/2013   Unilateral primary osteoarthritis, right knee 04/04/2013    Class: Diagnosis of   Candidal intertrigo 08/09/2012   Hypertension 12/15/2011   Type 2 diabetes mellitus with stage 3b chronic kidney disease, with long-term current use of insulin (HCC) 12/15/2011    PCP: Dorothyann Peng, MD   REFERRING PROVIDER: Ellender Hose, NP  REFERRING DIAG: [phone] (ICD-10-CM) - Acute bilateral low back pain with bilateral sciatica  Rationale for Evaluation and Treatment: Rehabilitation  THERAPY DIAG:  Other low back pain  Spinal stenosis of lumbar region, unspecified whether neurogenic claudication present  Muscle weakness (generalized)  ONSET DATE: acute on chronic  SUBJECTIVE:  SUBJECTIVE STATEMENT: Patient reports no current pain, HEP compliance.   Eval statement 04/21/2023: Patient is a 78 year old female who  presents today for lower back pain that goes down her legs. Patient reports the back pain first started about 03/19/2023 and  reports that it  got better then started back again on Monday. Patient reports she was treated with oxycodone and muscle relaxer when she initially started hurting.  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 Her back is not tender to touch but hurts when she leans forward and backwards. Patient rates her pain 7/10 when she is up walking, she is here with her daughter.  PERTINENT HISTORY:  Patient had bilateral knee replacement in  2015 and 2017 respectively. Her back is not tender to touch but hurts when she leans forward and backwards. Patient rates her pain 7/10 when she is up walking, she is here with her daughter.  PAIN:  Are you having pain? Yes: NPRS scale: 9/10 Pain location: low back  Pain description: ache Aggravating factors: walking and weight bearing tasks Relieving factors: sitting, heat and   PRECAUTIONS: None  RED FLAGS: None   WEIGHT BEARING RESTRICTIONS: No  FALLS:  Has patient fallen in last 6 months? No  OCCUPATION: not working  PLOF: Independent  PATIENT GOALS: To manage my back pain  NEXT MD VISIT: TBD  OBJECTIVE:  Note: Objective measures were completed at Evaluation unless otherwise noted.  DIAGNOSTIC FINDINGS:  IMPRESSION: Degenerative changes of the lumbar spine as above. Severe spinal canal stenosis at L4-5. Additional moderate spinal canal stenosis at L1-2 and L3-4.   Multilevel foraminal stenosis, greatest and severe at L5-S1.   Degenerative changes of the sacroiliac joints.   0.9 cm hyperattenuating focus within the right kidney which could reflect a cyst with hemorrhagic or proteinaceous debris. Consider renal ultrasound for further evaluation.     Electronically Signed   By: Emily Filbert M.D.   On: 04/15/2023 11:29  PATIENT SURVEYS:  Modified Oswestry 23/50 46% perceived disability   MUSCLE LENGTH: Hamstrings: Right 70 deg; Left 70 deg   POSTURE: rounded shoulders trunk shift to R  PALPATION: deferred  LUMBAR ROM:   AROM eval  Flexion 90%  Extension 0%  Right lateral flexion 75%  Left lateral flexion 75%  Right rotation 50%  Left rotation 25%   (Blank rows = not tested)  LOWER EXTREMITY ROM:   WFL for gait and transfers  Active  Right eval Left eval  Hip flexion    Hip extension    Hip abduction    Hip adduction    Hip internal rotation    Hip external rotation    Knee flexion    Knee extension    Ankle dorsiflexion    Ankle  plantarflexion    Ankle inversion    Ankle eversion     (Blank rows = not tested)  LOWER EXTREMITY MMT:    MMT Right eval Left eval  Hip flexion    Hip extension 4- 4-  Hip abduction 4- 4-  Hip adduction    Hip internal rotation    Hip external rotation    Knee flexion    Knee extension 4- 4-  Ankle dorsiflexion    Ankle plantarflexion 4- 4-  Ankle inversion    Ankle eversion     (Blank rows = not tested)  LUMBAR SPECIAL TESTS:  Straight leg raise test: Negative and Slump test: Negative  FUNCTIONAL TESTS:  30 seconds chair  stand test 7 reps with UE assist  GAIT: Distance walked: 33ft x2 Assistive device utilized: Single point cane Level of assistance: Modified independence Comments: slow cadence an antalgic gait  [facility] Adult PT Treatment:                                                DATE: 05/06/23 Therapeutic Exercise: NuStep level 5 x 5' Hamstring stretch 2x30" BIL Figure 4 stretch x1' ea. Supine clamshell unilateral unresisted 2x10 ea (difficult sequencing on Rt) Supine clamshell BIL RTB 2x10 - cues for pacing Therapeutic Activity: 180 ft - occasional use of UE on wall when fatigued, forward flexed posture, SOB at end of 2 mins Supine alternating march 2x30" Supine LTR x10 BIL Seated trunk rotations x10 ea. Cue pt to tap back pocket STS 2x10, single UE on both up/down - cues for upright posture at top   [facility] Adult PT Treatment:                                                DATE: 04/27/2023 Therapeutic Exercise: NuStep 5' Hamstring stretch 2x1' Figure 4 stretch 2x1' ea. Therapeutic Activity: Supine March 2x10 ea. Seated trunk rotations 2x10 ea. Cue pt to tap back pocket STS 2x12, one arm UE during stance, no UE on descent  [facility] Adult PT Treatment:                                                DATE: 04/21/23  Self Care: Additional minutes spent for educating on updated Therapeutic Home Exercise Program as well as comparing current status  to condition at start of care. This included exercises focusing on stretching, strengthening, with focus on eccentric aspects. Long term goals include an improvement in range of motion, strength, endurance as well as avoiding reinjury. Patient's frequency would include in 1-2 times a day, 3-5 times a week for a duration of 6-12 weeks. Proper technique shown and discussed handout in great detail. All questions were discussed and addressed.                                                                                                                             PATIENT EDUCATION:  Education details: Discussed eval findings, rehab rationale and POC and patient is in agreement  Person educated: Patient Education method: Explanation Education comprehension: verbalized understanding and needs further education  HOME EXERCISE PROGRAM: Access Code: [email or web address] URL: [email or web address] Date: 04/21/2023 Prepared by: Gustavus Bryant  Exercises - Sit to Stand with Armchair  - 1 x daily - 5  x weekly - 1 sets - 5 reps - Seated Long Arc Quad  - 1 x daily - 5 x weekly - 2 sets - 10 reps - Heel Toe Raises with Unilateral Counter Support  - 1 x daily - 5 x weekly - 2 sets - 10 reps  ASSESSMENT:  CLINICAL IMPRESSION: Patient presents to PT reporting no current pain and that she has been compliant with her HEP. Performed today with patient needing occasional use of UE on wall when she became fatigued, also displays forward flexed posture, and endorses SOB at end of 2 minutes. Session today continued to focus on hip and lumbar mobility and strengthening. Patient was able to tolerate all prescribed exercises with no adverse effects. Patient continues to benefit from skilled PT services and should be progressed as able to improve functional independence.   Eval impression 04/21/2023: Patient is a 78 y.o. female who was seen today for physical therapy evaluation and treatment for chronic low back  pain due to degenerative changes and underlying stenosis.  Patient demonstrates decreased trunk mobility especially extension.  LE strength deficits identified on 30s chair stand test.  Nerve tensioning did not elicit symptoms.  Discussed possibility of aquatic PT and will consider.  OBJECTIVE IMPAIRMENTS: Abnormal gait, decreased activity tolerance, decreased knowledge of condition, decreased mobility, difficulty walking, decreased strength, impaired perceived functional ability, improper body mechanics, postural dysfunction, obesity, and pain.   ACTIVITY LIMITATIONS: carrying, lifting, bending, sitting, standing, squatting, stairs, and transfers  PERSONAL FACTORS: Age, Fitness, Past/current experiences, and Time since onset of injury/illness/exacerbation are also affecting patient's functional outcome.   REHAB POTENTIAL: Fair based on chronicity and diagnosis  CLINICAL DECISION MAKING: Stable/uncomplicated  EVALUATION COMPLEXITY: Low   GOALS: Goals reviewed with patient? No  SHORT TERM GOALS: Target date: 05/12/2023  Patient to demonstrate independence in HEP  Baseline: Access Code: [email or web address] Goal status: INITIAL  2.  Assess 2 MWT test Baseline: Assessed 05/06/23 Goal status: INITIAL 05/06/23: 180 ft   LONG TERM GOALS: Target date: 06/02/2023    Patient will acknowledge 6/10 worst pain at least once during episode of care   Baseline: 9/10 Goal status: INITIAL  2.  Patient will score at least 33% on FOTO to signify clinically meaningful improvement in functional abilities.   Baseline: 46% Goal status: INITIAL  3.  Patient will increase 30s chair stand reps from 7 to 9 with/without arms to demonstrate and improved functional ability with less pain/difficulty as well as reduce fall risk.  Baseline: 7 Goal status: INITIAL  4.  Increase BLE strength to 4/5 Baseline:  MMT Right eval Left eval  Hip flexion    Hip extension 4- 4-  Hip abduction 4- 4-  Hip adduction    Hip  internal rotation    Hip external rotation    Knee flexion    Knee extension 4- 4-  Ankle dorsiflexion    Ankle plantarflexion 4- 4-   Goal status: INITIAL  5.  Assess progress on 2 MWT Baseline: TBD Goal status: INITIAL    PLAN:  PT FREQUENCY: 2x/week  PT DURATION: 6 weeks  PLANNED INTERVENTIONS: 97164- PT Re-evaluation, 97110-Therapeutic exercises, 97530- Therapeutic activity, 97112- Neuromuscular re-education, 97535- Self Care, 11914- Manual therapy, [phone]- Gait training, [phone]- Aquatic Therapy, Patient/Family education, Dry Needling, Joint mobilization, and Spinal mobilization.  PLAN FOR NEXT SESSION: Continue with therapeutic focus on core activation patterns, lumbar/hip motility, global hip strength, and dynamic core strength.  Berta Minor PTA  05/06/2023, 12:06 PM

## 2023-05-07 ENCOUNTER — Other Ambulatory Visit: Payer: Self-pay | Admitting: Internal Medicine

## 2023-05-11 ENCOUNTER — Encounter: Payer: Self-pay | Admitting: Physical Therapy

## 2023-05-11 ENCOUNTER — Ambulatory Visit: Admitting: Physical Therapy

## 2023-05-11 DIAGNOSIS — M5459 Other low back pain: Secondary | ICD-10-CM

## 2023-05-11 DIAGNOSIS — M48061 Spinal stenosis, lumbar region without neurogenic claudication: Secondary | ICD-10-CM

## 2023-05-11 DIAGNOSIS — M5442 Lumbago with sciatica, left side: Secondary | ICD-10-CM | POA: Diagnosis not present

## 2023-05-11 DIAGNOSIS — M6281 Muscle weakness (generalized): Secondary | ICD-10-CM | POA: Diagnosis not present

## 2023-05-11 DIAGNOSIS — M5441 Lumbago with sciatica, right side: Secondary | ICD-10-CM | POA: Diagnosis not present

## 2023-05-11 NOTE — Therapy (Signed)
 OUTPATIENT PHYSICAL THERAPY TREATMENT NOTE   Patient Name: Teresa Stanley MRN: [id number] DOB:Dec 10, 1945, 78 y.o., female Today's Date: 05/11/2023  END OF SESSION:  PT End of Session - 05/11/23 1159     Visit Number 4    Number of Visits 7    Authorization Type UHC MCR    Authorization Time Period 6 visits 04/21/23-06/16/23    Authorization - Visit Number 3    Authorization - Number of Visits 6    PT Start Time 1126    PT Stop Time 1206    PT Time Calculation (min) 40 min    Activity Tolerance Patient tolerated treatment well    Behavior During Therapy WFL for tasks assessed/performed              Past Medical History:  Diagnosis Date   Arthritis    Diabetes mellitus    takes Actos and Metformin daily and Victoza   GERD (gastroesophageal reflux disease)    takes Omeprazole daily   Headache(784.0)    occasionally   Hyperlipidemia    takes Atorvastatin daily   Hypertension    takes Lisinopril,Amlodipine,and Metoprolol daily   Hypothyroidism    Joint pain    Thyroid disease    ?   Past Surgical History:  Procedure Laterality Date   ABDOMINAL HYSTERECTOMY     BREAST BIOPSY Left    BREAST EXCISIONAL BIOPSY Left    CARDIAC CATHETERIZATION  2009   CATARACT EXTRACTION Right    EYE SURGERY     JOINT REPLACEMENT     KNEE ARTHROPLASTY Left 04/04/2013   Procedure: COMPUTER ASSISTED TOTAL KNEE ARTHROPLASTY;  Surgeon: Eldred Manges, MD;  Location: [facility] OR;  Service: Orthopedics;  Laterality: Left;  Left Total Knee Arthroplasty, Cemented   TOTAL KNEE ARTHROPLASTY Right 11/26/2015   Procedure: TOTAL KNEE ARTHROPLASTY;  Surgeon: Eldred Manges, MD;  Location: [facility] OR;  Service: Orthopedics;  Laterality: Right;   Patient Active Problem List   Diagnosis Date Noted   Spinal stenosis of lumbar region with neurogenic claudication 04/25/2023   Stage 3a chronic kidney disease (HCC) 04/25/2023   Viral upper respiratory tract infection 03/29/2023   Discomfort of right ear 11/30/2022    Mixed hyperlipidemia 04/06/2022   Class 3 severe obesity due to excess calories with serious comorbidity and body mass index (BMI) of 40.0 to 44.9 in adult (HCC) 05/28/2018   Parenchymal renal hypertension 08/11/2017   Right knee DJD 11/26/2015   S/P total knee arthroplasty 04/18/2013   Unilateral primary osteoarthritis, right knee 04/04/2013    Class: Diagnosis of   Candidal intertrigo 08/09/2012   Hypertension 12/15/2011   Type 2 diabetes mellitus with stage 3b chronic kidney disease, with long-term current use of insulin (HCC) 12/15/2011    PCP: Dorothyann Peng, MD   REFERRING PROVIDER: Ellender Hose, NP  REFERRING DIAG: [phone] (ICD-10-CM) - Acute bilateral low back pain with bilateral sciatica  Rationale for Evaluation and Treatment: Rehabilitation  THERAPY DIAG:  Other low back pain  Spinal stenosis of lumbar region, unspecified whether neurogenic claudication present  Muscle weakness (generalized)  ONSET DATE: acute on chronic  SUBJECTIVE:  SUBJECTIVE STATEMENT: Pt reports to therapy 5/10 pain, stating that "its just a little bit of pain"  has been partially compliant with HEP, doing "STS and counter squats primarily".  Hasn't been taking pain medication as much as usual.   Eval statement 04/21/2023: Patient is a 78 year old female who  presents today for lower back pain that goes down her legs. Patient reports the back pain first started about 03/19/2023 and  reports that it  got better then started back again on Monday. Patient reports she was treated with oxycodone and muscle relaxer when she initially started hurting.  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 Her back is not tender to touch but hurts when she leans forward and backwards. Patient rates her  pain 7/10 when she is up walking, she is here with her daughter.  PERTINENT HISTORY:  Patient had bilateral knee replacement in 2015 and 2017 respectively. Her back is not tender to touch but hurts when she leans forward and backwards. Patient rates her pain 7/10 when she is up walking, she is here with her daughter.  PAIN:  Are you having pain? Yes: NPRS scale: 9/10 Pain location: low back  Pain description: ache Aggravating factors: walking and weight bearing tasks Relieving factors: sitting, heat and   PRECAUTIONS: None  RED FLAGS: None   WEIGHT BEARING RESTRICTIONS: No  FALLS:  Has patient fallen in last 6 months? No  OCCUPATION: not working  PLOF: Independent  PATIENT GOALS: To manage my back pain  NEXT MD VISIT: TBD  OBJECTIVE:  Note: Objective measures were completed at Evaluation unless otherwise noted.  DIAGNOSTIC FINDINGS:  IMPRESSION: Degenerative changes of the lumbar spine as above. Severe spinal canal stenosis at L4-5. Additional moderate spinal canal stenosis at L1-2 and L3-4.   Multilevel foraminal stenosis, greatest and severe at L5-S1.   Degenerative changes of the sacroiliac joints.   0.9 cm hyperattenuating focus within the right kidney which could reflect a cyst with hemorrhagic or proteinaceous debris. Consider renal ultrasound for further evaluation.     Electronically Signed   By: Emily Filbert M.D.   On: 04/15/2023 11:29  PATIENT SURVEYS:  Modified Oswestry 23/50 46% perceived disability   MUSCLE LENGTH: Hamstrings: Right 70 deg; Left 70 deg   POSTURE: rounded shoulders trunk shift to R  PALPATION: deferred  LUMBAR ROM:   AROM eval  Flexion 90%  Extension 0%  Right lateral flexion 75%  Left lateral flexion 75%  Right rotation 50%  Left rotation 25%   (Blank rows = not tested)  LOWER EXTREMITY ROM:   WFL for gait and transfers  Active  Right eval Left eval  Hip flexion    Hip extension    Hip abduction    Hip  adduction    Hip internal rotation    Hip external rotation    Knee flexion    Knee extension    Ankle dorsiflexion    Ankle plantarflexion    Ankle inversion    Ankle eversion     (Blank rows = not tested)  LOWER EXTREMITY MMT:    MMT Right eval Left eval  Hip flexion    Hip extension 4- 4-  Hip abduction 4- 4-  Hip adduction    Hip internal rotation    Hip external rotation    Knee flexion    Knee extension 4- 4-  Ankle dorsiflexion    Ankle plantarflexion 4- 4-  Ankle inversion    Ankle  eversion     (Blank rows = not tested)  LUMBAR SPECIAL TESTS:  Straight leg raise test: Negative and Slump test: Negative  FUNCTIONAL TESTS:  30 seconds chair stand test 7 reps with UE assist  GAIT: Distance walked: 89ft x2 Assistive device utilized: Single point cane Level of assistance: Modified independence Comments: slow cadence an antalgic gait   [facility] Adult PT Treatment:                                                DATE: 05/11/2023  Therapeutic Exercise: NuStep level  5 x 5' Seated Hamstring stretch 2x1" ea. Piriformis cross body stretch 2x1' ea. Therapeutic Activity: Prone press up w/protraction 3x30s Supine LTR 2x12 BIL Supine bridge w/core brace 2x10, 3s hold STS 2x10, no UE, cues for head over hips   [facility] Adult PT Treatment:                                                DATE: 05/06/23 Therapeutic Exercise: NuStep level 5 x 5' Hamstring stretch 2x30" BIL Figure 4 stretch x1' ea. Supine clamshell unilateral unresisted 2x10 ea (difficult sequencing on Rt) Supine clamshell BIL RTB 2x10 - cues for pacing Therapeutic Activity: 180 ft - occasional use of UE on wall when fatigued, forward flexed posture, SOB at end of 2 mins Supine alternating march 2x30" Supine LTR x10 BIL Seated trunk rotations x10 ea. Cue pt to tap back pocket STS 2x10, single UE on both up/down - cues for upright posture at top    PATIENT EDUCATION:  Education details:  Discussed eval findings, rehab rationale and POC and patient is in agreement  Person educated: Patient Education method: Explanation Education comprehension: verbalized understanding and needs further education  HOME EXERCISE PROGRAM: Access Code: [email or web address] URL: [email or web address] Date: 04/21/2023 Prepared by: Gustavus Bryant  Exercises - Sit to Stand with Armchair  - 1 x daily - 5 x weekly - 1 sets - 5 reps - Seated Long Arc Quad  - 1 x daily - 5 x weekly - 2 sets - 10 reps - Heel Toe Raises with Unilateral Counter Support  - 1 x daily - 5 x weekly - 2 sets - 10 reps  ASSESSMENT:  CLINICAL IMPRESSION: Pt attended physical therapy session for continuation of treatment regarding low back pain. Today's treatment focused on improvement of  dynamic core stability, activity tolerance, functional transfer strength, and BLE strength necessary for improving ambulation and ADL quality. Pt showed  great tolerance to treatment and demonstrated improvement with activity tolerance and dynamic core stability since last session. Some difficulties continue with reported pain levels during ADLs between session (5/10 today). Pt required minimal verbal cuing alongside no physical assistance for safe and appropriate performance of today's activities. Continue with therapeutic focus on core activation patterns, lumbar/hip motility, global hip strength, and dynamic core strength.   Eval impression 04/21/2023: Patient is a 78 y.o. female who was seen today for physical therapy evaluation and treatment for chronic low back pain due to degenerative changes and underlying stenosis.  Patient demonstrates decreased trunk mobility especially extension.  LE strength deficits identified on 30s chair stand test.  Nerve tensioning did not elicit symptoms.  Discussed possibility of aquatic PT  and will consider.  OBJECTIVE IMPAIRMENTS: Abnormal gait, decreased activity tolerance, decreased knowledge of condition,  decreased mobility, difficulty walking, decreased strength, impaired perceived functional ability, improper body mechanics, postural dysfunction, obesity, and pain.   ACTIVITY LIMITATIONS: carrying, lifting, bending, sitting, standing, squatting, stairs, and transfers  PERSONAL FACTORS: Age, Fitness, Past/current experiences, and Time since onset of injury/illness/exacerbation are also affecting patient's functional outcome.   REHAB POTENTIAL: Fair based on chronicity and diagnosis  CLINICAL DECISION MAKING: Stable/uncomplicated  EVALUATION COMPLEXITY: Low   GOALS: Goals reviewed with patient? No  SHORT TERM GOALS: Target date: 05/12/2023  Patient to demonstrate independence in HEP  Baseline: Access Code: [email or web address] Goal status: INITIAL  2.  Assess 2 MWT test Baseline: Assessed 05/06/23 Goal status: INITIAL 05/06/23: 180 ft   LONG TERM GOALS: Target date: 06/02/2023    Patient will acknowledge 6/10 worst pain at least once during episode of care   Baseline: 9/10 Goal status: INITIAL  2.  Patient will score at least 33% on FOTO to signify clinically meaningful improvement in functional abilities.   Baseline: 46% Goal status: INITIAL  3.  Patient will increase 30s chair stand reps from 7 to 9 with/without arms to demonstrate and improved functional ability with less pain/difficulty as well as reduce fall risk.  Baseline: 7 Goal status: INITIAL  4.  Increase BLE strength to 4/5 Baseline:  MMT Right eval Left eval  Hip flexion    Hip extension 4- 4-  Hip abduction 4- 4-  Hip adduction    Hip internal rotation    Hip external rotation    Knee flexion    Knee extension 4- 4-  Ankle dorsiflexion    Ankle plantarflexion 4- 4-   Goal status: INITIAL  5.  Assess progress on 2 MWT Baseline: TBD Goal status: INITIAL    PLAN:  PT FREQUENCY: 2x/week  PT DURATION: 6 weeks  PLANNED INTERVENTIONS: 97164- PT Re-evaluation, 97110-Therapeutic exercises, 97530-  Therapeutic activity, 97112- Neuromuscular re-education, 97535- Self Care, 16109- Manual therapy, [phone]- Gait training, [phone]- Aquatic Therapy, Patient/Family education, Dry Needling, Joint mobilization, and Spinal mobilization.  PLAN FOR NEXT SESSION: Continue with therapeutic focus on core activation patterns, lumbar/hip motility, global hip strength, and dynamic core strength.  Luis Abed PT  05/11/2023, 12:08 PM

## 2023-05-13 ENCOUNTER — Ambulatory Visit

## 2023-05-14 DIAGNOSIS — H43821 Vitreomacular adhesion, right eye: Secondary | ICD-10-CM | POA: Diagnosis not present

## 2023-05-14 DIAGNOSIS — E119 Type 2 diabetes mellitus without complications: Secondary | ICD-10-CM | POA: Diagnosis not present

## 2023-05-14 DIAGNOSIS — H353111 Nonexudative age-related macular degeneration, right eye, early dry stage: Secondary | ICD-10-CM | POA: Diagnosis not present

## 2023-05-14 DIAGNOSIS — H04123 Dry eye syndrome of bilateral lacrimal glands: Secondary | ICD-10-CM | POA: Diagnosis not present

## 2023-05-14 DIAGNOSIS — Z961 Presence of intraocular lens: Secondary | ICD-10-CM | POA: Diagnosis not present

## 2023-05-14 LAB — HM DIABETES EYE EXAM

## 2023-05-18 ENCOUNTER — Ambulatory Visit: Admitting: Physical Therapy

## 2023-05-20 ENCOUNTER — Telehealth: Payer: Self-pay | Admitting: Pharmacist

## 2023-05-20 ENCOUNTER — Ambulatory Visit: Admitting: Physical Therapy

## 2023-05-20 DIAGNOSIS — Z794 Long term (current) use of insulin: Secondary | ICD-10-CM

## 2023-05-20 NOTE — Progress Notes (Unsigned)
   05/20/2023  Patient ID: Teresa Stanley, female   DOB: 03-21-1945, 78 y.o.   MRN: [id number]  Received Provider portion of Patient's Refill/Reorder form.  This form was faxed the RX Medication Assistance Team along with a message sent to Star Age, CPhT for follow up.  Reorder form was for [employer] and Minus Breeding, PharmD, [facility] Clinical Pharmacist [phone]

## 2023-05-25 ENCOUNTER — Encounter: Payer: Self-pay | Admitting: Physical Therapy

## 2023-05-25 ENCOUNTER — Ambulatory Visit: Attending: Family Medicine | Admitting: Physical Therapy

## 2023-05-25 DIAGNOSIS — M48061 Spinal stenosis, lumbar region without neurogenic claudication: Secondary | ICD-10-CM | POA: Diagnosis not present

## 2023-05-25 DIAGNOSIS — M5459 Other low back pain: Secondary | ICD-10-CM | POA: Diagnosis not present

## 2023-05-25 DIAGNOSIS — M6281 Muscle weakness (generalized): Secondary | ICD-10-CM | POA: Insufficient documentation

## 2023-05-25 NOTE — Therapy (Signed)
 OUTPATIENT PHYSICAL THERAPY TREATMENT NOTE   Patient Name: Teresa Stanley MRN: [id number] DOB:1945-09-17, 78 y.o., female Today's Date: 05/25/2023  END OF SESSION:  PT End of Session - 05/25/23 1126     Visit Number 5    Number of Visits 7    Date for PT Re-Evaluation 06/21/23    Authorization Type UHC MCR    Authorization Time Period 6 visits 04/21/23-06/16/23    Authorization - Visit Number 4    Authorization - Number of Visits 6    PT Start Time 1126    PT Stop Time 1204    PT Time Calculation (min) 38 min    Activity Tolerance Patient tolerated treatment well    Behavior During Therapy WFL for tasks assessed/performed               Past Medical History:  Diagnosis Date   Arthritis    Diabetes mellitus    takes Actos and Metformin daily and Victoza   GERD (gastroesophageal reflux disease)    takes Omeprazole daily   Headache(784.0)    occasionally   Hyperlipidemia    takes Atorvastatin daily   Hypertension    takes Lisinopril,Amlodipine,and Metoprolol daily   Hypothyroidism    Joint pain    Thyroid disease    ?   Past Surgical History:  Procedure Laterality Date   ABDOMINAL HYSTERECTOMY     BREAST BIOPSY Left    BREAST EXCISIONAL BIOPSY Left    CARDIAC CATHETERIZATION  2009   CATARACT EXTRACTION Right    EYE SURGERY     JOINT REPLACEMENT     KNEE ARTHROPLASTY Left 04/04/2013   Procedure: COMPUTER ASSISTED TOTAL KNEE ARTHROPLASTY;  Surgeon: Adah Acron, MD;  Location: [facility] OR;  Service: Orthopedics;  Laterality: Left;  Left Total Knee Arthroplasty, Cemented   TOTAL KNEE ARTHROPLASTY Right 11/26/2015   Procedure: TOTAL KNEE ARTHROPLASTY;  Surgeon: Adah Acron, MD;  Location: [facility] OR;  Service: Orthopedics;  Laterality: Right;   Patient Active Problem List   Diagnosis Date Noted   Spinal stenosis of lumbar region with neurogenic claudication 04/25/2023   Stage 3a chronic kidney disease (HCC) 04/25/2023   Viral upper respiratory tract infection 03/29/2023    Discomfort of right ear 11/30/2022   Mixed hyperlipidemia 04/06/2022   Class 3 severe obesity due to excess calories with serious comorbidity and body mass index (BMI) of 40.0 to 44.9 in adult (HCC) 05/28/2018   Parenchymal renal hypertension 08/11/2017   Right knee DJD 11/26/2015   S/P total knee arthroplasty 04/18/2013   Unilateral primary osteoarthritis, right knee 04/04/2013    Class: Diagnosis of   Candidal intertrigo 08/09/2012   Hypertension 12/15/2011   Type 2 diabetes mellitus with stage 3b chronic kidney disease, with long-term current use of insulin (HCC) 12/15/2011    PCP: Cleave Curling, MD   REFERRING PROVIDER: Melodie Spry, NP  REFERRING DIAG: [phone] (ICD-10-CM) - Acute bilateral low back pain with bilateral sciatica  Rationale for Evaluation and Treatment: Rehabilitation  THERAPY DIAG:  Other low back pain  Spinal stenosis of lumbar region, unspecified whether neurogenic claudication present  Muscle weakness (generalized)  ONSET DATE: acute on chronic  SUBJECTIVE:  SUBJECTIVE STATEMENT: Pt attended today's session with reports of 7/10 pain in general, 6/10 when walking. Pt stated that they have maintained partial compliance with current HEP. Primarily doing squats in HEP   Eval statement 04/21/2023: Patient is a 78 year old female who  presents today for lower back pain that goes down her legs. Patient reports the back pain first started about 03/19/2023 and  reports that it  got better then started back again on Monday. Patient reports she was treated with oxycodone and muscle relaxer when she initially started hurting.  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 Her back is not tender to touch but hurts when she leans forward and backwards. Patient  rates her pain 7/10 when she is up walking, she is here with her daughter.  PERTINENT HISTORY:  Patient had bilateral knee replacement in 2015 and 2017 respectively. Her back is not tender to touch but hurts when she leans forward and backwards. Patient rates her pain 7/10 when she is up walking, she is here with her daughter.  PAIN:  Are you having pain? Yes: NPRS scale: 9/10 Pain location: low back  Pain description: ache Aggravating factors: walking and weight bearing tasks Relieving factors: sitting, heat and   PRECAUTIONS: None  RED FLAGS: None   WEIGHT BEARING RESTRICTIONS: No  FALLS:  Has patient fallen in last 6 months? No  OCCUPATION: not working  PLOF: Independent  PATIENT GOALS: To manage my back pain  NEXT MD VISIT: TBD  OBJECTIVE:  Note: Objective measures were completed at Evaluation unless otherwise noted.  DIAGNOSTIC FINDINGS:  IMPRESSION: Degenerative changes of the lumbar spine as above. Severe spinal canal stenosis at L4-5. Additional moderate spinal canal stenosis at L1-2 and L3-4.   Multilevel foraminal stenosis, greatest and severe at L5-S1.   Degenerative changes of the sacroiliac joints.   0.9 cm hyperattenuating focus within the right kidney which could reflect a cyst with hemorrhagic or proteinaceous debris. Consider renal ultrasound for further evaluation.     Electronically Signed   By: Denny Flack M.D.   On: 04/15/2023 11:29  PATIENT SURVEYS:  Modified Oswestry 23/50 46% perceived disability   MUSCLE LENGTH: Hamstrings: Right 70 deg; Left 70 deg   POSTURE: rounded shoulders trunk shift to R  PALPATION: deferred  LUMBAR ROM:   AROM eval  Flexion 90%  Extension 0%  Right lateral flexion 75%  Left lateral flexion 75%  Right rotation 50%  Left rotation 25%   (Blank rows = not tested)  LOWER EXTREMITY ROM:   WFL for gait and transfers  Active  Right eval Left eval  Hip flexion    Hip extension    Hip  abduction    Hip adduction    Hip internal rotation    Hip external rotation    Knee flexion    Knee extension    Ankle dorsiflexion    Ankle plantarflexion    Ankle inversion    Ankle eversion     (Blank rows = not tested)  LOWER EXTREMITY MMT:    MMT Right eval Left eval  Hip flexion    Hip extension 4- 4-  Hip abduction 4- 4-  Hip adduction    Hip internal rotation    Hip external rotation    Knee flexion    Knee extension 4- 4-  Ankle dorsiflexion    Ankle plantarflexion 4- 4-  Ankle inversion    Ankle eversion     (Blank rows =  not tested)  LUMBAR SPECIAL TESTS:  Straight leg raise test: Negative and Slump test: Negative  FUNCTIONAL TESTS:  30 seconds chair stand test 7 reps with UE assist  GAIT: Distance walked: 60ft x2 Assistive device utilized: Single point cane Level of assistance: Modified independence Comments: slow cadence an antalgic gait  [facility] Adult PT Treatment:                                                DATE: 05/25/23 Therapeutic Exercise: NuStep level  5 x 5' Seated Hamstring stretch 2x1' ea. Piriformis cross body stretch 2x1' ea. Therapeutic Activity: Supine LTR w/Pball 2x12 BIL Supine bridge w/core brace agianst GTB 2x10, 3s hold STS against GTB 2x10, no UE, cues for head over hips   PATIENT EDUCATION:  Education details: Discussed eval findings, rehab rationale and POC and patient is in agreement  Person educated: Patient Education method: Explanation Education comprehension: verbalized understanding and needs further education  HOME EXERCISE PROGRAM: Access Code: [facility] URL: [email or web address] Date: 05/25/2023 Prepared by: Sheliah Plane  Exercises - Supine Lower Trunk Rotation  - 1 x daily - 7 x weekly - 3 sets - 10 reps - Sit to Stand Without Arm Support  - 1 x daily - 7 x weekly - 2-3 sets - 10 reps - Clam with Resistance  - 1 x daily - 7 x weekly - 3 sets - 10 reps - Supine Bridge with  Resistance Band  - 1 x daily - 7 x weekly - 3 sets - 10 reps - 3s hold - Supine March with Resistance Band  - 1 x daily - 7 x weekly - 3 sets - 10 reps  ASSESSMENT:  CLINICAL IMPRESSION: Pt attended physical therapy session for continuation of treatment regarding low back pain. Today's treatment focused on improvement of  dynamic core stability. Pt showed  good tolerance to administered treatment with no adverse effects by the end of session. Pt required moderate verbal/tactile cuing alongside no physical assistance for safe and appropriate performance of today's activities. Continue with therapeutic focus on core activation patterns, lumbar/hip motility, global hip strength, and dynamic core strength.   Eval impression 04/21/2023: Patient is a 78 y.o. female who was seen today for physical therapy evaluation and treatment for chronic low back pain due to degenerative changes and underlying stenosis.  Patient demonstrates decreased trunk mobility especially extension.  LE strength deficits identified on 30s chair stand test.  Nerve tensioning did not elicit symptoms.  Discussed possibility of aquatic PT and will consider.  OBJECTIVE IMPAIRMENTS: Abnormal gait, decreased activity tolerance, decreased knowledge of condition, decreased mobility, difficulty walking, decreased strength, impaired perceived functional ability, improper body mechanics, postural dysfunction, obesity, and pain.   ACTIVITY LIMITATIONS: carrying, lifting, bending, sitting, standing, squatting, stairs, and transfers  PERSONAL FACTORS: Age, Fitness, Past/current experiences, and Time since onset of injury/illness/exacerbation are also affecting patient's functional outcome.   REHAB POTENTIAL: Fair based on chronicity and diagnosis  CLINICAL DECISION MAKING: Stable/uncomplicated  EVALUATION COMPLEXITY: Low   GOALS: Goals reviewed with patient? No  SHORT TERM GOALS: Target date: 05/12/2023  Patient to demonstrate  independence in HEP  Baseline: Access Code: [email or web address] Goal status: INITIAL  2.  Assess 2 MWT test Baseline: Assessed 05/06/23 Goal status: INITIAL 05/06/23: 180 ft   LONG TERM GOALS: Target date: 06/02/2023    Patient will acknowledge 6/10  worst pain at least once during episode of care   Baseline: 9/10 Goal status: INITIAL  2.  Patient will score at least 33% on FOTO to signify clinically meaningful improvement in functional abilities.   Baseline: 46% Goal status: INITIAL  3.  Patient will increase 30s chair stand reps from 7 to 9 with/without arms to demonstrate and improved functional ability with less pain/difficulty as well as reduce fall risk.  Baseline: 7 Goal status: INITIAL  4.  Increase BLE strength to 4/5 Baseline:  MMT Right eval Left eval  Hip flexion    Hip extension 4- 4-  Hip abduction 4- 4-  Hip adduction    Hip internal rotation    Hip external rotation    Knee flexion    Knee extension 4- 4-  Ankle dorsiflexion    Ankle plantarflexion 4- 4-   Goal status: INITIAL  5.  Assess progress on 2 MWT Baseline: TBD Goal status: INITIAL    PLAN:  PT FREQUENCY: 2x/week  PT DURATION: 6 weeks  PLANNED INTERVENTIONS: 97164- PT Re-evaluation, 97110-Therapeutic exercises, 97530- Therapeutic activity, 97112- Neuromuscular re-education, 97535- Self Care, 16109- Manual therapy, [phone]- Gait training, [phone]- Aquatic Therapy, Patient/Family education, Dry Needling, Joint mobilization, and Spinal mobilization.  PLAN FOR NEXT SESSION: Continue with therapeutic focus on core activation patterns, lumbar/hip motility, global hip strength, and dynamic core strength.    Albesa Huguenin, PT, DPT 05/25/2023, 12:06 PM

## 2023-05-28 ENCOUNTER — Encounter: Payer: Self-pay | Admitting: Internal Medicine

## 2023-05-28 ENCOUNTER — Other Ambulatory Visit: Payer: Self-pay | Admitting: Internal Medicine

## 2023-05-28 MED ORDER — OXYCODONE-ACETAMINOPHEN 5-325 MG PO TABS: 1.0000 | ORAL_TABLET | ORAL | 0 refills | Status: AC | PRN

## 2023-06-05 ENCOUNTER — Ambulatory Visit

## 2023-06-05 DIAGNOSIS — Z0000 Encounter for general adult medical examination without abnormal findings: Secondary | ICD-10-CM | POA: Diagnosis not present

## 2023-06-05 NOTE — Patient Instructions (Signed)

## 2023-06-05 NOTE — Progress Notes (Signed)
**Note De-Identified Teresa Obfuscation**  Subjective:   Teresa Stanley is a 78 y.o. female who presents for Medicare Annual (Subsequent) preventive examination.  Visit Complete: Virtual I connected with  Teresa Stanley on 06/05/23 by a audio enabled telemedicine application and verified that I am speaking with the correct person using two identifiers.  Patient Location: Home  Provider Location: Home Office  I discussed the limitations of evaluation and management by telemedicine. The patient expressed understanding and agreed to proceed.  Vital Signs: Because this visit was a virtual/telehealth visit, some criteria may be missing or patient reported. Any vitals not documented were not able to be obtained and vitals that have been documented are patient reported.  Patient Medicare AWV questionnaire was completed by the patient on 06/05/2023; I have confirmed that all information answered by patient is correct and no changes since this date.  Cardiac Risk Factors include: diabetes mellitus;hypertension     Objective:    Today's Vitals   06/05/23 0914  PainSc: 0-No pain   There is no height or weight on file to calculate BMI.     04/15/2023    9:12 AM 03/19/2023   11:38 PM 04/03/2022   11:00 AM 03/21/2021   11:41 AM 03/22/2020    9:49 AM 03/17/2019    9:45 AM 05/27/2018   11:12 AM  Advanced Directives  Does Patient Have a Medical Advance Directive? No No Yes Yes Yes Yes Yes  Type of Surveyor, minerals;Living will Healthcare Power of [place];Living will Healthcare Power of [place];Living will Healthcare Power of [place];Living will Healthcare Power of [place];Living will  Does patient want to make changes to medical advance directive?       No - Patient declined  Copy of Healthcare Power of Attorney in Chart?   No - copy requested No - copy requested No - copy requested No - copy requested No - copy requested  Would patient like information on creating a medical advance directive? No - Patient  declined No - Patient declined         Current Medications (verified) Outpatient Encounter Medications as of 06/05/2023  Medication Sig   amLODipine  (NORVASC ) 10 MG tablet TAKE 1 TABLET BY MOUTH EVERY DAY   aspirin  81 MG tablet Take 81 mg by mouth daily.   atorvastatin  (LIPITOR) 40 MG tablet TAKE 1 TABLET BY MOUTH EVERY DAY   BD PEN NEEDLE NANO 2ND GEN 32G X 4 MM MISC USE WITH TRESIBA   Blood Glucose Calibration (OT ULTRA/FASTTK CNTRL SOLN) SOLN    carboxymethylcellulose (REFRESH PLUS) 0.5 % SOLN Place 1 drop into both eyes daily.   chlorthalidone (HYGROTON) 25 MG tablet Take 25 mg by mouth daily.   cholecalciferol (VITAMIN D3) 25 MCG (1000 UNIT) tablet Take 1,000 Units by mouth daily.   dapagliflozin  propanediol (FARXIGA ) 10 MG TABS tablet Take 1 tablet (10 mg total) by mouth daily before breakfast.   gabapentin  (NEURONTIN ) 300 MG capsule TAKE 1 CAPSULE BY MOUTH EVERYDAY AT BEDTIME   hydrALAZINE  (APRESOLINE ) 25 MG tablet TAKE 1 TABLET BY MOUTH TWICE A DAY   insulin  degludec (TRESIBA FLEXTOUCH) 100 UNIT/ML SOPN FlexTouch Pen Inject 16 Units into the skin at bedtime.    lidocaine  (LIDODERM ) 5 % Place 1 patch onto the skin daily. Remove & Discard patch within 12 hours or as directed by MD   meclizine  (ANTIVERT ) 25 MG tablet TAKE 1 TABLET (25 MG TOTAL) BY MOUTH 3 (THREE) TIMES DAILY AS NEEDED FOR DIZZINESS.   methimazole  (  TAPAZOLE ) 5 MG tablet TAKE 1 TABLET (5 MG TOTAL) BY MOUTH DAILY.   OneTouch Delica Lancets 33G MISC Use as directed to check blood sugars 2 times per day dx: e11.22   ONETOUCH ULTRA test strip USE AS INSTRUCTED TO CHECK BLOOD SUGARS 2 TIMES PER DAY DX: E11.22   oxyCODONE -acetaminophen  (PERCOCET) 5-325 MG tablet Take 1 tablet by mouth every 4 (four) hours as needed for severe pain (pain score 7-10).   propranolol  ER (INDERAL  LA) 120 MG 24 hr capsule TAKE 1 CAPSULE BY MOUTH EVERY DAY   Semaglutide , 1 MG/DOSE, 4 MG/3ML SOPN Inject 1 mg into the skin once a week.   sodium  bicarbonate 650 MG tablet TAKE 1 TABLET BY MOUTH EVERY DAY   tiZANidine  (ZANAFLEX ) 4 MG tablet Take 1 tablet (4 mg total) by mouth every 6 (six) hours as needed for muscle spasms.   triamcinolone  cream (KENALOG ) 0.1 % Apply 1 application topically 2 (two) times daily. Apply   Blood Glucose Monitoring Suppl (ONE TOUCH ULTRA 2) w/Device KIT Use as directed to check blood sugars 2 times per day dx:e11.22   famotidine  (HM FAMOTIDINE ) 10 MG tablet Take 1 tablet (10 mg total) by mouth daily.   olmesartan  (BENICAR ) 40 MG tablet Take 1 tablet (40 mg total) by mouth daily.   No facility-administered encounter medications on file as of 06/05/2023.    Allergies (verified) Patient has no known allergies.   History: Past Medical History:  Diagnosis Date   Arthritis    Diabetes mellitus    takes Actos  and Metformin  daily and Victoza    GERD (gastroesophageal reflux disease)    takes Omeprazole daily   Headache(784.0)    occasionally   Hyperlipidemia    takes Atorvastatin  daily   Hypertension    takes Lisinopril ,Amlodipine ,and Metoprolol  daily   Hypothyroidism    Joint pain    Thyroid  disease    ?   Past Surgical History:  Procedure Laterality Date   ABDOMINAL HYSTERECTOMY     BREAST BIOPSY Left    BREAST EXCISIONAL BIOPSY Left    CARDIAC CATHETERIZATION  2009   CATARACT EXTRACTION Right    EYE SURGERY     JOINT REPLACEMENT     KNEE ARTHROPLASTY Left 04/04/2013   Procedure: COMPUTER ASSISTED TOTAL KNEE ARTHROPLASTY;  Surgeon: Adah Acron, MD;  Location: [facility] OR;  Service: Orthopedics;  Laterality: Left;  Left Total Knee Arthroplasty, Cemented   TOTAL KNEE ARTHROPLASTY Right 11/26/2015   Procedure: TOTAL KNEE ARTHROPLASTY;  Surgeon: Adah Acron, MD;  Location: [facility] OR;  Service: Orthopedics;  Laterality: Right;   Family History  Problem Relation Age of Onset   Diabetes Mother    Hypertension Mother    Diabetes Father    Asthma Father    Hypertension Father    Breast cancer Sister 7    Hypertension Sister        2   Hypertension Sister    Diabetes Sister    Hypertension Brother        5   Kidney disease Brother    Hypertension Daughter    Colon cancer Neg Hx    Esophageal cancer Neg Hx    Rectal cancer Neg Hx    Stomach cancer Neg Hx    Social History   Socioeconomic History   Marital status: Widowed    Spouse name: Not on file   Number of children: 4   Years of education: Not on file   Highest education level: Not on  file  Occupational History   Occupation: retired  Tobacco Use   Smoking status: Never    Passive exposure: Never   Smokeless tobacco: Never  Vaping Use   Vaping status: Never Used  Substance and Sexual Activity   Alcohol  use: No   Drug use: No   Sexual activity: Not Currently    Birth control/protection: Surgical  Other Topics Concern   Not on file  Social History Narrative   Not on file   Social Drivers of Health   Financial Resource Strain: Low Risk  (06/05/2023)   Overall Financial Resource Strain (CARDIA)    Difficulty of Paying Living Expenses: Not very hard  Food Insecurity: No Food Insecurity (06/05/2023)   Hunger Vital Sign    Worried About Running Out of Food in the Last Year: Never true    Ran Out of Food in the Last Year: Never true  Transportation Needs: No Transportation Needs (06/05/2023)   PRAPARE - Administrator, Civil Service (Medical): No    Lack of Transportation (Non-Medical): No  Physical Activity: Insufficiently Active (06/05/2023)   Exercise Vital Sign    Days of Exercise per Week: 3 days    Minutes of Exercise per Session: 30 min  Stress: No Stress Concern Present (06/05/2023)   [employer] of Occupational Health - Occupational Stress Questionnaire    Feeling of Stress : Not at all  Social Connections: Unknown (06/05/2023)   Social Connection and Isolation Panel [NHANES]    Frequency of Communication with Friends and Family: More than three times a week    Frequency of Social  Gatherings with Friends and Family: Once a week    Attends Religious Services: More than 4 times per year    Active Member of [employer] or Organizations: No    Attends Engineer, structural: Never    Marital Status: Patient unable to answer    Tobacco Counseling Counseling given: Not Answered   Clinical Intake:  Pre-visit preparation completed: Yes  Pain : No/denies pain Pain Score: 0-No pain     Diabetes: Yes CBG done?: No Did pt. bring in CBG monitor from home?: No  How often do you need to have someone help you when you read instructions, pamphlets, or other written materials from your doctor or pharmacy?: 1 - Never What is the last grade level you completed in school?: 12th grade  Interpreter Needed?: No  Information entered by :: [place] [facility]   Activities of Daily Living    06/05/2023    9:15 AM  In your present state of health, do you have any difficulty performing the following activities:  Hearing? 0  Vision? 0  Difficulty concentrating or making decisions? 0  Walking or climbing stairs? 0  Dressing or bathing? 0  Doing errands, shopping? 0  Preparing Food and eating ? N  Using the Toilet? N  In the past six months, have you accidently leaked urine? N  Do you have problems with loss of bowel control? N  Managing your Medications? N  Managing your Finances? N  Housekeeping or managing your Housekeeping? N    Patient Care Team: Cleave Curling, MD as PCP - General (Internal Medicine)  Indicate any recent Medical Services you may have received from other than [facility] providers in the past year (date may be approximate).     Assessment:   This is a routine wellness examination for Teresa Stanley.  Hearing/Vision screen No results found.   Goals Addressed  This Visit's Progress     Travel (pt-stated)        She would like to go to the beach this summer. She wants to also get out & do more exercising.       Depression Screen     06/05/2023    9:21 AM 06/05/2023    9:19 AM 07/21/2022    2:17 PM 04/03/2022   11:02 AM 09/26/2021    2:59 PM 03/21/2021   11:42 AM 03/22/2020    9:50 AM  PHQ 2/9 Scores  PHQ - 2 Score 0 0 0 0 0 0 0  PHQ- 9 Score   0        Fall Risk    06/05/2023    9:20 AM 07/21/2022    2:17 PM 04/03/2022   11:01 AM 09/26/2021    2:59 PM 03/21/2021   11:42 AM  Fall Risk   Falls in the past year? 0 0 0 0 0  Number falls in past yr: 0 0 0 0   Injury with Fall? 0 0 0 0   Risk for fall due to : No Fall Risks No Fall Risks Medication side effect;Impaired mobility;Impaired balance/gait  Medication side effect  Follow up Falls evaluation completed Falls evaluation completed Falls prevention discussed;Education provided;Falls evaluation completed Falls evaluation completed Falls evaluation completed;Education provided;Falls prevention discussed    MEDICARE RISK AT HOME: Medicare Risk at Home Any stairs in or around the home?: Yes If so, are there any without handrails?: No Home free of loose throw rugs in walkways, pet beds, electrical cords, etc?: Yes Adequate lighting in your home to reduce risk of falls?: Yes Life alert?: No Use of a cane, walker or w/c?: Yes Grab bars in the bathroom?: Yes Shower chair or bench in shower?: Yes Elevated toilet seat or a handicapped toilet?: Yes  Cognitive Function:        06/05/2023    9:21 AM 04/03/2022   11:03 AM 03/21/2021   11:43 AM 03/22/2020    9:51 AM 03/17/2019    9:47 AM  6CIT Screen  What Year? 0 points 0 points 0 points 0 points 0 points  What month? 0 points 0 points 0 points 0 points 0 points  What time? 0 points 0 points 0 points 0 points 0 points  Count back from 20 0 points 0 points 0 points 2 points 0 points  Months in reverse 2 points 2 points 2 points 0 points 0 points  Repeat phrase 6 points 0 points 2 points 4 points 2 points  Total Score 8 points 2 points 4 points 6 points 2 points    Immunizations Immunization History  Administered Date(s)  Administered   Fluad Quad(high Dose 65+) 12/26/2020   Fluad Trivalent(High Dose 65+) 11/20/2022   Influenza, High Dose Seasonal PF 11/12/2017, 11/09/2018, 11/22/2019   Influenza-Unspecified 11/21/2021   PFIZER Comirnaty(Gray Top)Covid-19 Tri-Sucrose Vaccine 08/01/2020   PFIZER(Purple Top)SARS-COV-2 Vaccination 04/04/2019, 04/25/2019, 12/07/2019   PNEUMOCOCCAL CONJUGATE-20 06/19/2021   Pneumococcal Polysaccharide-23 11/29/2015   Tdap 08/11/2017   Unspecified SARS-COV-2 Vaccination 01/13/2022   Zoster Recombinant(Shingrix ) 04/03/2021, 07/16/2021    TDAP status: Up to date  Flu Vaccine status: Up to date  Pneumococcal vaccine status: Up to date  Covid-19 vaccine status: Completed vaccines  Qualifies for Shingles Vaccine? Yes   Zostavax completed Yes   Shingrix  Completed?: Yes  Screening Tests Health Maintenance  Topic Date Due   COVID-19 Vaccine (6 - 2024-25 season) 10/12/2022   FOOT EXAM  01/01/2023  INFLUENZA VACCINE  09/11/2023   HEMOGLOBIN A1C  09/15/2023   Diabetic kidney evaluation - eGFR measurement  04/19/2024   Diabetic kidney evaluation - Urine ACR  04/19/2024   OPHTHALMOLOGY EXAM  05/13/2024   Medicare Annual Wellness (AWV)  06/04/2024   DTaP/Tdap/Td (2 - Td or Tdap) 08/12/2027   Pneumonia Vaccine 72+ Years old  Completed   DEXA SCAN  Completed   Hepatitis C Screening  Completed   Zoster Vaccines- Shingrix   Completed   HPV VACCINES  Aged Out   Meningococcal B Vaccine  Aged Out   Colonoscopy  Discontinued    Health Maintenance  Health Maintenance Due  Topic Date Due   COVID-19 Vaccine (6 - 2024-25 season) 10/12/2022   FOOT EXAM  01/01/2023    Bone Density status: Completed 04/29/23. Results reflect: Bone density results: OSTEOPENIA. Repeat every 3 years.  Additional Screening:  Hepatitis C Screening: does qualify; Completed 02/01/2018  Dental Screening: Recommended annual dental exams for proper oral hygiene  Diabetic Foot Exam: Diabetic Foot  Exam: Completed 12/31/21  Community Resource Referral / Chronic Care Management: CRR required this visit?  No   CCM required this visit?  No     Plan:     I have personally reviewed and noted the following in the patient's chart:   Medical and social history Use of alcohol , tobacco or illicit drugs  Current medications and supplements including opioid prescriptions. Patient is not currently taking opioid prescriptions. Functional ability and status Nutritional status Physical activity Advanced directives List of other physicians Hospitalizations, surgeries, and ER visits in previous 12 months Vitals Screenings to include cognitive, depression, and falls Referrals and appointments  In addition, I have reviewed and discussed with patient certain preventive protocols, quality metrics, and best practice recommendations. A written personalized care plan for preventive services as well as general preventive health recommendations were provided to patient.     Edwinna Grammes, CMA   06/05/2023   After Visit Summary: (MyChart) Due to this being a telephonic visit, the after visit summary with patients personalized plan was offered to patient Teresa MyChart   Nurse Notes: Adan Holms

## 2023-06-08 ENCOUNTER — Ambulatory Visit: Admitting: Physical Therapy

## 2023-06-15 ENCOUNTER — Ambulatory Visit: Attending: Family Medicine | Admitting: Physical Therapy

## 2023-06-15 ENCOUNTER — Encounter: Payer: Self-pay | Admitting: Physical Therapy

## 2023-06-15 DIAGNOSIS — M6281 Muscle weakness (generalized): Secondary | ICD-10-CM | POA: Diagnosis not present

## 2023-06-15 DIAGNOSIS — M5459 Other low back pain: Secondary | ICD-10-CM | POA: Diagnosis not present

## 2023-06-15 NOTE — Therapy (Signed)
 OUTPATIENT PHYSICAL THERAPY TREATMENT & PROGRESS NOTE   Patient Name: Teresa Stanley MRN: [id number] DOB:24-Mar-1945, 78 y.o., female Today's Date: 06/15/2023  END OF SESSION:  PT End of Session - 06/15/23 1433     Visit Number 6    Number of Visits 7    Date for PT Re-Evaluation 06/21/23    Authorization Time Period 6 visits 04/21/23-06/16/23    Authorization - Visit Number 5    Authorization - Number of Visits 6    PT Start Time 1400    PT Stop Time 1433    PT Time Calculation (min) 33 min    Activity Tolerance Patient tolerated treatment well    Behavior During Therapy WFL for tasks assessed/performed                Past Medical History:  Diagnosis Date   Arthritis    Diabetes mellitus    takes Actos  and Metformin  daily and Victoza    GERD (gastroesophageal reflux disease)    takes Omeprazole daily   Headache(784.0)    occasionally   Hyperlipidemia    takes Atorvastatin  daily   Hypertension    takes Lisinopril ,Amlodipine ,and Metoprolol  daily   Hypothyroidism    Joint pain    Thyroid  disease    ?   Past Surgical History:  Procedure Laterality Date   ABDOMINAL HYSTERECTOMY     BREAST BIOPSY Left    BREAST EXCISIONAL BIOPSY Left    CARDIAC CATHETERIZATION  2009   CATARACT EXTRACTION Right    EYE SURGERY     JOINT REPLACEMENT     KNEE ARTHROPLASTY Left 04/04/2013   Procedure: COMPUTER ASSISTED TOTAL KNEE ARTHROPLASTY;  Surgeon: Adah Acron, MD;  Location: [facility] OR;  Service: Orthopedics;  Laterality: Left;  Left Total Knee Arthroplasty, Cemented   TOTAL KNEE ARTHROPLASTY Right 11/26/2015   Procedure: TOTAL KNEE ARTHROPLASTY;  Surgeon: Adah Acron, MD;  Location: [facility] OR;  Service: Orthopedics;  Laterality: Right;   Patient Active Problem List   Diagnosis Date Noted   Spinal stenosis of lumbar region with neurogenic claudication 04/25/2023   Stage 3a chronic kidney disease (HCC) 04/25/2023   Viral upper respiratory tract infection 03/29/2023   Discomfort of  right ear 11/30/2022   Mixed hyperlipidemia 04/06/2022   Class 3 severe obesity due to excess calories with serious comorbidity and body mass index (BMI) of 40.0 to 44.9 in adult 05/28/2018   Parenchymal renal hypertension 08/11/2017   Right knee DJD 11/26/2015   S/P total knee arthroplasty 04/18/2013   Unilateral primary osteoarthritis, right knee 04/04/2013    Class: Diagnosis of   Candidal intertrigo 08/09/2012   Hypertension 12/15/2011   Type 2 diabetes mellitus with stage 3b chronic kidney disease, with long-term current use of insulin  (HCC) 12/15/2011    PCP: Cleave Curling, MD   REFERRING PROVIDER: Melodie Spry, NP  REFERRING DIAG: [id number] (ICD-10-CM) - Acute bilateral low back pain with bilateral sciatica  Rationale for Evaluation and Treatment: Rehabilitation  THERAPY DIAG:  Other low back pain  Muscle weakness (generalized)  ONSET DATE: acute on chronic  SUBJECTIVE:  SUBJECTIVE STATEMENT: Pt attended today's session with reports of 3/10 pain. Pt stated that they have maintained good compliance with current HEP.  States that the pain is much more manageable nowadays.  Eval statement 04/21/2023: Patient is a 78 year old female who  presents today for lower back pain that goes down her legs. Patient reports the back pain first started about 03/19/2023 and  reports that it  got better then started back again on Monday. Patient reports she was treated with oxycodone  and muscle relaxer when she initially started hurting.  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 Her back is not tender to touch but hurts when she leans forward and backwards. Patient rates her pain 7/10 when she is up walking, she is here with her daughter.  PERTINENT HISTORY:  Patient had  bilateral knee replacement in 2015 and 2017 respectively. Her back is not tender to touch but hurts when she leans forward and backwards. Patient rates her pain 7/10 when she is up walking, she is here with her daughter.  PAIN:  Are you having pain? Yes: NPRS scale: 9/10 Pain location: low back  Pain description: ache Aggravating factors: walking and weight bearing tasks Relieving factors: sitting, heat and   PRECAUTIONS: None  RED FLAGS: None   WEIGHT BEARING RESTRICTIONS: No  FALLS:  Has patient fallen in last 6 months? No  OCCUPATION: not working  PLOF: Independent  PATIENT GOALS: To manage my back pain  NEXT MD VISIT: TBD  OBJECTIVE:  Note: Objective measures were completed at Evaluation unless otherwise noted.  DIAGNOSTIC FINDINGS:  IMPRESSION: Degenerative changes of the lumbar spine as above. Severe spinal canal stenosis at L4-5. Additional moderate spinal canal stenosis at L1-2 and L3-4.   Multilevel foraminal stenosis, greatest and severe at L5-S1.   Degenerative changes of the sacroiliac joints.   0.9 cm hyperattenuating focus within the right kidney which could reflect a cyst with hemorrhagic or proteinaceous debris. Consider renal ultrasound for further evaluation.     Electronically Signed   By: Denny Flack M.D.   On: 04/15/2023 11:29  PATIENT SURVEYS:  Modified Oswestry 23/50 46% perceived disability  06/15/2023: 12/50 (24%)  MUSCLE LENGTH: Hamstrings: Right 70 deg; Left 70 deg   POSTURE: rounded shoulders trunk shift to R  PALPATION: deferred  LUMBAR ROM:   AROM eval  Flexion 90%  Extension 0%  Right lateral flexion 75%  Left lateral flexion 75%  Right rotation 50%  Left rotation 25%   (Blank rows = not tested)  LOWER EXTREMITY ROM:   WFL for gait and transfers  Active  Right eval Left eval  Hip flexion    Hip extension    Hip abduction    Hip adduction    Hip internal rotation    Hip external rotation    Knee flexion     Knee extension    Ankle dorsiflexion    Ankle plantarflexion    Ankle inversion    Ankle eversion     (Blank rows = not tested)  LOWER EXTREMITY MMT:    MMT Right eval 06/15/2023 Left eval 06/15/2023  Hip flexion      Hip extension 4- 5 4- 5  Hip abduction 4- 4 4- 4  Hip adduction      Hip internal rotation      Hip external rotation      Knee flexion      Knee extension 4- 4+ 4- 4+  Ankle dorsiflexion  Ankle plantarflexion 4- 4+ 4- 4+  Ankle inversion      Ankle eversion       (Blank rows = not tested)  LUMBAR SPECIAL TESTS:  Straight leg raise test: Negative and Slump test: Negative  FUNCTIONAL TESTS:  30 seconds chair stand test 7 reps with UE assist 06/15/2023: 10, no UE assist  GAIT: Distance walked: 38ft x2 Assistive device utilized: Single point cane Level of assistance: Modified independence Comments: slow cadence an antalgic gait   [facility] Adult PT Treatment:                                                DATE: 06/15/2023  Therapeutic Activity: Re-evaluative measures  Self Care: POC discussion Pt education  [facility] Adult PT Treatment:                                                DATE: 05/25/23 Therapeutic Exercise: NuStep level  5 x 5' Seated Hamstring stretch 2x1' ea. Piriformis cross body stretch 2x1' ea. Therapeutic Activity: Supine LTR w/Pball 2x12 BIL Supine bridge w/core brace agianst GTB 2x10, 3s hold STS against GTB 2x10, no UE, cues for head over hips   PATIENT EDUCATION:  Education details: Discussed eval findings, rehab rationale and POC and patient is in agreement  Person educated: Patient Education method: Explanation Education comprehension: verbalized understanding and needs further education  HOME EXERCISE PROGRAM: Access Code: [facility] URL: [email or web address] Date: 05/25/2023 Prepared by: Albesa Huguenin  Exercises - Supine Lower Trunk Rotation  - 1 x daily - 7 x weekly - 3 sets - 10 reps - Sit to  Stand Without Arm Support  - 1 x daily - 7 x weekly - 2-3 sets - 10 reps - Clam with Resistance  - 1 x daily - 7 x weekly - 3 sets - 10 reps - Supine Bridge with Resistance Band  - 1 x daily - 7 x weekly - 3 sets - 10 reps - 3s hold - Supine March with Resistance Band  - 1 x daily - 7 x weekly - 3 sets - 10 reps  ASSESSMENT:  CLINICAL IMPRESSION: Pt attended physical therapy session for re-evaluation of  low back pain. Pt has met  all therapeutic goals and reports overall improvement with daily pain levels. Mild difficulty remains with pain increasing during prolonged walking >73min, however pt is content with current functional level and has met all therapeutic goals. Pt required minimal verbal/tactile cuing as well as no physical assistance for safe and appropriate performance of today's activities. D/c at completion of today's session d/t meeting all stated rehab goals.   Eval impression 04/21/2023: Patient is a 78 y.o. female who was seen today for physical therapy evaluation and treatment for chronic low back pain due to degenerative changes and underlying stenosis.  Patient demonstrates decreased trunk mobility especially extension.  LE strength deficits identified on 30s chair stand test.  Nerve tensioning did not elicit symptoms.  Discussed possibility of aquatic PT and will consider.  OBJECTIVE IMPAIRMENTS: Abnormal gait, decreased activity tolerance, decreased knowledge of condition, decreased mobility, difficulty walking, decreased strength, impaired perceived functional ability, improper body mechanics, postural dysfunction, obesity, and pain.   ACTIVITY LIMITATIONS: carrying, lifting, bending, sitting,  standing, squatting, stairs, and transfers  PERSONAL FACTORS: Age, Fitness, Past/current experiences, and Time since onset of injury/illness/exacerbation are also affecting patient's functional outcome.   REHAB POTENTIAL: Fair based on chronicity and diagnosis  CLINICAL DECISION MAKING:  Stable/uncomplicated  EVALUATION COMPLEXITY: Low   GOALS: Goals reviewed with patient? No  SHORT TERM GOALS: Target date: 05/12/2023  Patient to demonstrate independence in HEP  Baseline: Access Code: [email or web address] Goal status: 06/15/2023  2.  Assess 2 MWT test Baseline: Assessed 05/06/23 Goal status: MET 05/06/2023 05/06/23: 180 ft   LONG TERM GOALS: Target date: 06/02/2023    Patient will acknowledge 6/10 worst pain at least once during episode of care   Baseline: 9/10 06/15/2023: 3/10 Goal status: MET 06/15/2023   2.  Patient will score at least 33% on MODI to signify clinically meaningful improvement in functional abilities.   Baseline: 46% Goal status: MET 06/15/2023 (12/50 24%)  3.  Patient will increase 30s chair stand reps from 7 to 9 with/without arms to demonstrate and improved functional ability with less pain/difficulty as well as reduce fall risk.  Baseline: 7 06/15/2023: 10, no UE  Goal status: MET 06/15/2023  4.  Increase BLE strength to 4/5 Baseline: see obj chart Goal status: MET 06/15/2023  5.  Assess progress on 2 MWT Baseline: TBD Goal status: MET 06/15/2023 210 ft    PLAN:  PT FREQUENCY: 2x/week  PT DURATION: 6 weeks  PLANNED INTERVENTIONS: 97164- PT Re-evaluation, 97110-Therapeutic exercises, 97530- Therapeutic activity, 97112- Neuromuscular re-education, 97535- Self Care, 16109- Manual therapy, [phone]- Gait training, [phone]- Aquatic Therapy, Patient/Family education, Dry Needling, Joint mobilization, and Spinal mobilization.  PLAN FOR NEXT SESSION: d/c   Albesa Huguenin, PT, DPT 06/15/2023, 2:34 PM

## 2023-06-24 ENCOUNTER — Telehealth: Payer: Self-pay

## 2023-06-24 NOTE — Telephone Encounter (Signed)
 Patient also has ozempic 

## 2023-06-24 NOTE — Telephone Encounter (Signed)
 Called patient to inform their  [employer] patient assistance  is ready for pick up, patient aware.

## 2023-06-29 ENCOUNTER — Encounter: Payer: Self-pay | Admitting: Internal Medicine

## 2023-06-29 ENCOUNTER — Telehealth: Payer: Self-pay

## 2023-06-29 NOTE — Telephone Encounter (Signed)
 Can you please send a rx for walker to [facility]?

## 2023-06-29 NOTE — Telephone Encounter (Signed)
 An order for a walker with a seat has been ordered for patient through [facility]. YL,RMA

## 2023-07-02 ENCOUNTER — Telehealth: Payer: Self-pay | Admitting: Internal Medicine

## 2023-07-02 NOTE — Telephone Encounter (Signed)
 PICK UP TRES[employer]A U100 5X3ML QTY 5 OZEMPIC  1X3ML QTY 4 NOVOFINE 32G TIP SIZE 100 QTY 2 06/25/23

## 2023-07-14 ENCOUNTER — Other Ambulatory Visit: Payer: Self-pay | Admitting: Internal Medicine

## 2023-07-14 DIAGNOSIS — Z1231 Encounter for screening mammogram for malignant neoplasm of breast: Secondary | ICD-10-CM

## 2023-07-22 ENCOUNTER — Encounter: Payer: Self-pay | Admitting: Internal Medicine

## 2023-07-22 ENCOUNTER — Ambulatory Visit: Payer: Self-pay | Admitting: Internal Medicine

## 2023-07-22 ENCOUNTER — Ambulatory Visit: Payer: Medicare Other | Admitting: Internal Medicine

## 2023-07-22 VITALS — BP 130/70 | HR 83 | Temp 98.7°F | Ht 61.0 in | Wt 225.2 lb

## 2023-07-22 DIAGNOSIS — R600 Localized edema: Secondary | ICD-10-CM

## 2023-07-22 DIAGNOSIS — Z0000 Encounter for general adult medical examination without abnormal findings: Secondary | ICD-10-CM | POA: Diagnosis not present

## 2023-07-22 DIAGNOSIS — Z6841 Body Mass Index (BMI) 40.0 and over, adult: Secondary | ICD-10-CM

## 2023-07-22 DIAGNOSIS — H6122 Impacted cerumen, left ear: Secondary | ICD-10-CM | POA: Diagnosis not present

## 2023-07-22 DIAGNOSIS — E782 Mixed hyperlipidemia: Secondary | ICD-10-CM

## 2023-07-22 DIAGNOSIS — E1122 Type 2 diabetes mellitus with diabetic chronic kidney disease: Secondary | ICD-10-CM | POA: Diagnosis not present

## 2023-07-22 DIAGNOSIS — E1141 Type 2 diabetes mellitus with diabetic mononeuropathy: Secondary | ICD-10-CM

## 2023-07-22 DIAGNOSIS — M48062 Spinal stenosis, lumbar region with neurogenic claudication: Secondary | ICD-10-CM

## 2023-07-22 DIAGNOSIS — Z794 Long term (current) use of insulin: Secondary | ICD-10-CM | POA: Diagnosis not present

## 2023-07-22 DIAGNOSIS — N1832 Chronic kidney disease, stage 3b: Secondary | ICD-10-CM

## 2023-07-22 DIAGNOSIS — E0590 Thyrotoxicosis, unspecified without thyrotoxic crisis or storm: Secondary | ICD-10-CM

## 2023-07-22 DIAGNOSIS — I129 Hypertensive chronic kidney disease with stage 1 through stage 4 chronic kidney disease, or unspecified chronic kidney disease: Secondary | ICD-10-CM | POA: Diagnosis not present

## 2023-07-22 DIAGNOSIS — R8290 Unspecified abnormal findings in urine: Secondary | ICD-10-CM

## 2023-07-22 LAB — POCT URINALYSIS DIP (CLINITEK)
Bilirubin, UA: NEGATIVE
Blood, UA: NEGATIVE
Glucose, UA: 500 mg/dL — AB
Ketones, POC UA: NEGATIVE mg/dL
Nitrite, UA: NEGATIVE
POC PROTEIN,UA: 30 — AB
Spec Grav, UA: 1.015 (ref 1.010–1.025)
Urobilinogen, UA: 0.2 U/dL
pH, UA: 6 (ref 5.0–8.0)

## 2023-07-22 NOTE — Progress Notes (Signed)
 LILLETTE Kristeen JINNY Gladis, CMA,acting as a scribe for Catheryn LOISE Slocumb, MD.,have documented all relevant documentation on the behalf of Catheryn LOISE Slocumb, MD,as directed by  Catheryn LOISE Slocumb, MD while in the presence of Catheryn LOISE Slocumb, MD.  Subjective:    Patient ID: Teresa Stanley , female    DOB: 03-19-1945 , 78 y.o.   MRN: [id number]  Chief Complaint  Patient presents with   Annual Exam    Patient presents today for annual exam. Patient doesn't have any specific questions or concerns. Patient denies headaches, chest pain & sob.    Diabetes   Hyperlipidemia   Hypertension    HPI Discussed the use of AI scribe software for clinical note transcription with the patient, who gave verbal consent to proceed.  History of Present Illness Teresa Stanley is a 78 year old female who presents for a physical and diabetes check.  She experiences daily swelling on top of her feet and ankles, described as fluid retention, which subsides at night. The swelling has been more pronounced since the weather has warmed, and she notes less severe swelling during winter months. She denies using salt in her cooking and attributes some swelling to celebrations and hot weather. No urinary symptoms such as urgency, burning, or increased frequency.  Her current medications include bicarbonate tablets, Ozempic  1 mg weekly, propranolol  daily, olmesartan  40 mg, methimazole , [place] every night, hydralazine  twice a day, gabapentin  at night, famotidine  daily, Farxiga  10 mg daily, chlorthalidone 25 mg daily, atorvastatin  40 mg, amlodipine  10 mg, and aspirin . She no longer takes a muscle relaxer or uses Kenalog  cream.  Her back has not been hurting lately, and physical therapy has been beneficial. She has not seen a specialist for her back. No shortness of breath beyond her usual and no tenderness in her back.   She has an upcoming mammogram appointment scheduled for July 17. She also takes MiraLAX  for her bowels.   Diabetes She  presents for her follow-up diabetic visit. She has type 2 diabetes mellitus. Her disease course has been stable. There are no hypoglycemic associated symptoms. Pertinent negatives for diabetes include no blurred vision and no chest pain. There are no hypoglycemic complications. Diabetic complications include nephropathy. Risk factors for coronary artery disease include diabetes mellitus, dyslipidemia, obesity, post-menopausal and sedentary lifestyle. She is compliant with treatment some of the time. Her weight is decreasing steadily. She is following a generally healthy diet. She participates in exercise intermittently. Her breakfast blood glucose is taken between 8-9 am. Her breakfast blood glucose range is generally 130-140 mg/dl. An ACE inhibitor/angiotensin II receptor blocker is being taken. Eye exam is current.  Hypertension The current episode started more than 1 year ago. The problem has been gradually improving since onset. The problem is uncontrolled. Pertinent negatives include no blurred vision, chest pain, palpitations or shortness of breath. The current treatment provides moderate improvement. Compliance problems include exercise.  Hypertensive end-organ damage includes kidney disease. Identifiable causes of hypertension include chronic renal disease.  Hyperlipidemia This is a chronic problem. The problem is uncontrolled. Exacerbating diseases include chronic renal disease, diabetes and obesity. Pertinent negatives include no chest pain or shortness of breath. Current antihyperlipidemic treatment includes statins. The current treatment provides moderate improvement of lipids. Compliance problems include adherence to exercise.  Risk factors for coronary artery disease include diabetes mellitus, dyslipidemia, hypertension, post-menopausal, a sedentary lifestyle and obesity.     Past Medical History:  Diagnosis Date   Arthritis    Diabetes mellitus  takes Actos  and Metformin  daily and Victoza     GERD (gastroesophageal reflux disease)    takes Omeprazole daily   Headache(784.0)    occasionally   Hyperlipidemia    takes Atorvastatin  daily   Hypertension    takes Lisinopril ,Amlodipine ,and Metoprolol  daily   Hypothyroidism    Joint pain    Thyroid  disease    ?     Family History  Problem Relation Age of Onset   Diabetes Mother    Hypertension Mother    Diabetes Father    Asthma Father    Hypertension Father    Breast cancer Sister 105   Hypertension Sister        2   Hypertension Sister    Diabetes Sister    Hypertension Brother        5   Kidney disease Brother    Hypertension Daughter    Colon cancer Neg Hx    Esophageal cancer Neg Hx    Rectal cancer Neg Hx    Stomach cancer Neg Hx      Current Outpatient Medications:    amLODipine  (NORVASC ) 10 MG tablet, TAKE 1 TABLET BY MOUTH EVERY DAY, Disp: 90 tablet, Rfl: 2   aspirin  81 MG tablet, Take 81 mg by mouth daily., Disp: , Rfl:    atorvastatin  (LIPITOR) 40 MG tablet, TAKE 1 TABLET BY MOUTH EVERY DAY, Disp: 90 tablet, Rfl: 2   BD PEN NEEDLE NANO 2ND GEN 32G X 4 MM MISC, USE WITH TRESIBA, Disp: 100 each, Rfl: 10   Blood Glucose Calibration (OT ULTRA/FASTTK CNTRL SOLN) SOLN, , Disp: , Rfl:    Blood Glucose Monitoring Suppl (ONE TOUCH ULTRA 2) w/Device KIT, Use as directed to check blood sugars 2 times per day dx:e11.22, Disp: 1 each, Rfl: 1   carboxymethylcellulose (REFRESH PLUS) 0.5 % SOLN, Place 1 drop into both eyes daily., Disp: , Rfl:    chlorthalidone (HYGROTON) 25 MG tablet, Take 25 mg by mouth daily., Disp: , Rfl:    cholecalciferol (VITAMIN D3) 25 MCG (1000 UNIT) tablet, Take 1,000 Units by mouth daily., Disp: , Rfl:    dapagliflozin  propanediol (FARXIGA ) 10 MG TABS tablet, Take 1 tablet (10 mg total) by mouth daily before breakfast., Disp: 90 tablet, Rfl: 3   gabapentin  (NEURONTIN ) 300 MG capsule, TAKE 1 CAPSULE BY MOUTH EVERYDAY AT BEDTIME, Disp: 90 capsule, Rfl: 2   hydrALAZINE  (APRESOLINE ) 25 MG  tablet, TAKE 1 TABLET BY MOUTH TWICE A DAY, Disp: 60 tablet, Rfl: 1   insulin  degludec (TRESIBA FLEXTOUCH) 100 UNIT/ML SOPN FlexTouch Pen, Inject 16 Units into the skin at bedtime. , Disp: , Rfl:    lidocaine  (LIDODERM ) 5 %, Place 1 patch onto the skin daily. Remove & Discard patch within 12 hours or as directed by MD, Disp: 30 patch, Rfl: 2   meclizine  (ANTIVERT ) 25 MG tablet, TAKE 1 TABLET (25 MG TOTAL) BY MOUTH 3 (THREE) TIMES DAILY AS NEEDED FOR DIZZINESS., Disp: 30 tablet, Rfl: 0   methimazole  (TAPAZOLE ) 5 MG tablet, TAKE 1 TABLET (5 MG TOTAL) BY MOUTH DAILY., Disp: 90 tablet, Rfl: 1   OneTouch Delica Lancets 33G MISC, Use as directed to check blood sugars 2 times per day dx: e11.22, Disp: 300 each, Rfl: 2   ONETOUCH ULTRA test strip, USE AS INSTRUCTED TO CHECK BLOOD SUGARS 2 TIMES PER DAY DX: E11.22, Disp: 200 strip, Rfl: 4   oxyCODONE -acetaminophen  (PERCOCET) 5-325 MG tablet, Take 1 tablet by mouth every 4 (four) hours as needed for severe  pain (pain score 7-10)., Disp: 20 tablet, Rfl: 0   propranolol  ER (INDERAL  LA) 120 MG 24 hr capsule, TAKE 1 CAPSULE BY MOUTH EVERY DAY, Disp: 90 capsule, Rfl: 2   Semaglutide , 1 MG/DOSE, 4 MG/3ML SOPN, Inject 1 mg into the skin once a week., Disp: 3 mL, Rfl: 2   sodium bicarbonate 650 MG tablet, TAKE 1 TABLET BY MOUTH EVERY DAY, Disp: 90 tablet, Rfl: 3   triamcinolone  cream (KENALOG ) 0.1 %, Apply 1 application topically 2 (two) times daily. Apply, Disp: 45 g, Rfl: 1   famotidine  (HM FAMOTIDINE ) 10 MG tablet, Take 1 tablet (10 mg total) by mouth daily., Disp: 90 tablet, Rfl: 1   olmesartan  (BENICAR ) 40 MG tablet, Take 1 tablet (40 mg total) by mouth daily., Disp: 90 tablet, Rfl: 2   No Known Allergies    The patient states she uses post menopausal status for birth control. No LMP recorded. Patient has had a hysterectomy.. Negative for Dysmenorrhea. Negative for: breast discharge, breast lump(s), breast pain and breast self exam. Associated symptoms include  abnormal vaginal bleeding. Pertinent negatives include abnormal bleeding (hematology), anxiety, decreased libido, depression, difficulty falling sleep, dyspareunia, history of infertility, nocturia, sexual dysfunction, sleep disturbances, urinary incontinence, urinary urgency, vaginal discharge and vaginal itching. Diet regular.The patient states her exercise level is    . The patient's tobacco use is:  Social History   Tobacco Use  Smoking Status Never   Passive exposure: Never  Smokeless Tobacco Never  . She has been exposed to passive smoke. The patient's alcohol  use is:  Social History   Substance and Sexual Activity  Alcohol  Use No    Review of Systems  Constitutional: Negative.   HENT: Negative.    Eyes: Negative.  Negative for blurred vision.  Respiratory: Negative.  Negative for shortness of breath.   Cardiovascular:  Positive for leg swelling. Negative for chest pain and palpitations.  Gastrointestinal: Negative.   Endocrine: Negative.   Genitourinary: Negative.   Musculoskeletal: Negative.   Skin: Negative.   Allergic/Immunologic: Negative.   Neurological: Negative.   Hematological: Negative.   Psychiatric/Behavioral: Negative.       Today's Vitals   07/22/23 1414  BP: 130/70  Pulse: 83  Temp: 98.7 F (37.1 C)  TempSrc: Oral  Weight: 225 lb 3.2 oz (102.2 kg)  Height: 5' 1 (1.549 m)  PainSc: 0-No pain   Body mass index is 42.55 kg/m.  Wt Readings from Last 3 Encounters:  07/22/23 225 lb 3.2 oz (102.2 kg)  04/20/23 222 lb 9.6 oz (101 kg)  04/15/23 229 lb 0.9 oz (103.9 kg)     Objective:  Physical Exam Vitals and nursing note reviewed.  Constitutional:      Appearance: Normal appearance. She is obese.  HENT:     Head: Normocephalic and atraumatic.     Right Ear: Tympanic membrane, ear canal and external ear normal.     Left Ear: Ear canal and external ear normal. There is impacted cerumen.     Nose:     Comments: Masked      Mouth/Throat:      Comments: Masked   Eyes:     Extraocular Movements: Extraocular movements intact.     Conjunctiva/sclera: Conjunctivae normal.     Pupils: Pupils are equal, round, and reactive to light.    Cardiovascular:     Rate and Rhythm: Normal rate and regular rhythm.     Pulses:          Dorsalis pedis  pulses are 1+ on the right side and 1+ on the left side.     Heart sounds: Normal heart sounds.  Pulmonary:     Effort: Pulmonary effort is normal.     Breath sounds: Normal breath sounds.  Chest:  Breasts:    Tanner Score is 5.     Right: Normal.     Left: Normal.     Comments: Pendulous Abdominal:     General: Bowel sounds are normal.     Palpations: Abdomen is soft.     Comments: Obese, soft. Difficult to assess organomegaly due to body habitus.  Genitourinary:    Comments: deferred  Musculoskeletal:        General: Tenderness present.     Cervical back: Normal range of motion and neck supple.     Right lower leg: Edema present.     Left lower leg: Edema present.     Comments: Pain with movement of left shoulder, POS tenderness to deep palpation  Feet:     Right foot:     Protective Sensation: 5 sites tested.  5 sites sensed.     Skin integrity: Dry skin present.     Toenail Condition: Right toenails are long.     Left foot:     Protective Sensation: 5 sites tested.  5 sites sensed.     Skin integrity: Dry skin present.     Toenail Condition: Left toenails are long.   Skin:    General: Skin is warm and dry.   Neurological:     General: No focal deficit present.     Mental Status: She is alert and oriented to person, place, and time.   Psychiatric:        Mood and Affect: Mood normal.        Behavior: Behavior normal.       Assessment And Plan:     Encounter for annual health examination Assessment & Plan: A full exam was performed.  Importance of monthly self breast exams was discussed with the patient.  She is advised to get 30-45 minutes of regular exercise, no  less than four to five days per week. Both weight-bearing and aerobic exercises are recommended.  She is advised to follow a healthy diet with at least six fruits/veggies per day, decrease intake of red meat and other saturated fats and to increase fish intake to twice weekly.  Meats/fish should not be fried -- baked, boiled or broiled is preferable. It is also important to cut back on your sugar intake.  Be sure to read labels - try to avoid anything with added sugar, high fructose corn syrup or other sweeteners.  If you must use a sweetener, you can try stevia or monkfruit.  It is also important to avoid artificially sweetened foods/beverages and diet drinks. Lastly, wear SPF 50 sunscreen on exposed skin and when in direct sunlight for an extended period of time.  Be sure to avoid fast food restaurants and aim for at least 60 ounces of water  daily.       Type 2 diabetes mellitus with stage 3b chronic kidney disease, with long-term current use of insulin  [facility]) Assessment & Plan: Chronic, she is reminded to avoid NSAIDs, stay hydrated and keep BP/BS controlled to decrease risk of CKD progression. Nephrology input is appreciated. Regarding diabetes, she will continue with Tresiba 16 units nightly and semaglutide  1mg  weekly. She will f/u in four months for re-evaluation.   Orders: -     EKG  12-Lead -     POCT URINALYSIS DIP (CLINITEK) -     Microalbumin / creatinine urine ratio -     Lipid panel -     Hemoglobin A1c -     BMP8+EGFR -     PTH, intact and calcium  -     Phosphorus -     Protein electrophoresis, serum  Type 2 diabetes mellitus with diabetic mononeuropathy, with long-term current use of insulin  (HCC) Assessment & Plan: Chronic, diabetic foot exam was performed. I DISCUSSED WITH THE PATIENT AT LENGTH REGARDING THE GOALS OF GLYCEMIC CONTROL AND POSSIBLE LONG-TERM COMPLICATIONS.  I  ALSO STRESSED THE IMPORTANCE OF COMPLIANCE WITH HOME GLUCOSE MONITORING, DIETARY RESTRICTIONS INCLUDING  AVOIDANCE OF SUGARY DRINKS/PROCESSED FOODS,  ALONG WITH REGULAR EXERCISE.  I  ALSO STRESSED THE IMPORTANCE OF ANNUAL EYE EXAMS, SELF FOOT CARE AND COMPLIANCE WITH OFFICE VISITS.   Orders: -     BMP8+EGFR  Parenchymal renal hypertension, stage 1 through stage 4 or unspecified chronic kidney disease Assessment & Plan: Chronic, controlled.  EKG performed, NSR w/ right bundle branch block with left axis -bifascicular block.  - She will continue with propranolol , Benicar , hydralazine , and amlodipine .   Spinal stenosis of lumbar region with neurogenic claudication Assessment & Plan: Chronic, thankfully her sx have improved with physical therapy.  She is encouraged to perform stretching exercises regularly.     Left ear impacted cerumen Assessment & Plan: AFTER OBTAINING VERBAL CONSENT, LEFT EAR WAS FLUSHED BY IRRIGATION. SHE TOLERATED PROCEDURE WELL WITHOUT ANY COMPLICATIONS. NO TM ABNORMALITIES WERE NOTED.   Orders: -     Ear Lavage  Hyperthyroidism -     TSH + free T4  Abnormal urine -     Urine Culture  Peripheral edema Assessment & Plan: Chronic swelling of feet and ankles, likely due to amlodipine  use for hypertension control. - Perform heel raises during commercials, 3 sets of 20 daily. - Encourage adequate hydration   Morbid obesity with BMI of 40.0-44.9, adult (HCC) Assessment & Plan: BMI 42. She is encouraged to aim for at least 150 minutes of exercise per week. She is reminded chair exercises will count.     Return for 1 year physical, controlled DM check 4 months. Patient was given opportunity to ask questions. Patient verbalized understanding of the plan and was able to repeat key elements of the plan. All questions were answered to their satisfaction.   I, Catheryn LOISE Slocumb, MD, have reviewed all documentation for this visit. The documentation on 07/22/23 for the exam, diagnosis, procedures, and orders are all accurate and complete.

## 2023-07-23 LAB — BMP8+EGFR
BUN/Creatinine Ratio: 19 (ref 12–28)
BUN: 34 mg/dL — ABNORMAL HIGH (ref 8–27)
CO2: 23 mmol/L (ref 20–29)
Calcium: 9.5 mg/dL (ref 8.7–10.3)
Chloride: 102 mmol/L (ref 96–106)
Creatinine, Ser: 1.75 mg/dL — ABNORMAL HIGH (ref 0.57–1.00)
Glucose: 78 mg/dL (ref 70–99)
Potassium: 4.9 mmol/L (ref 3.5–5.2)
Sodium: 141 mmol/L (ref 134–144)
eGFR: 30 mL/min/{1.73_m2} — ABNORMAL LOW (ref >59)

## 2023-07-23 LAB — PROTEIN ELECTROPHORESIS, SERUM
A/G Ratio: 1.1 (ref 0.7–1.7)
Albumin ELP: 3.7 g/dL (ref 2.9–4.4)
Alpha 1: 0.2 g/dL (ref 0.0–0.4)
Alpha 2: 1 g/dL (ref 0.4–1.0)
Beta: 1 g/dL (ref 0.7–1.3)
Gamma Globulin: 1.1 g/dL (ref 0.4–1.8)
Globulin, Total: 3.4 g/dL (ref 2.2–3.9)
Total Protein: 7.1 g/dL (ref 6.0–8.5)

## 2023-07-23 LAB — MICROALBUMIN / CREATININE URINE RATIO
Creatinine, Urine: 45.5 mg/dL
Microalb/Creat Ratio: 344 mg/g{creat} — ABNORMAL HIGH (ref 0–29)
Microalbumin, Urine: 156.5 ug/mL

## 2023-07-23 LAB — HEMOGLOBIN A1C
Est. average glucose Bld gHb Est-mCnc: 151 mg/dL
Hgb A1c MFr Bld: 6.9 % — ABNORMAL HIGH (ref 4.8–5.6)

## 2023-07-23 LAB — TSH+FREE T4
Free T4: 1.21 ng/dL (ref 0.82–1.77)
TSH: 2.62 u[IU]/mL (ref 0.450–4.500)

## 2023-07-23 LAB — URINE CULTURE

## 2023-07-23 LAB — LIPID PANEL
Chol/HDL Ratio: 2.6 ratio (ref 0.0–4.4)
Cholesterol, Total: 156 mg/dL (ref 100–199)
HDL: 61 mg/dL (ref >39)
LDL Chol Calc (NIH): 74 mg/dL (ref 0–99)
Triglycerides: 122 mg/dL (ref 0–149)
VLDL Cholesterol Cal: 21 mg/dL (ref 5–40)

## 2023-07-23 LAB — PTH, INTACT AND CALCIUM: PTH: 48 pg/mL (ref 15–65)

## 2023-07-23 LAB — PHOSPHORUS: Phosphorus: 3.3 mg/dL (ref 3.0–4.3)

## 2023-08-02 DIAGNOSIS — H6122 Impacted cerumen, left ear: Secondary | ICD-10-CM | POA: Insufficient documentation

## 2023-08-02 DIAGNOSIS — R600 Localized edema: Secondary | ICD-10-CM | POA: Insufficient documentation

## 2023-08-02 DIAGNOSIS — E1141 Type 2 diabetes mellitus with diabetic mononeuropathy: Secondary | ICD-10-CM | POA: Insufficient documentation

## 2023-08-02 DIAGNOSIS — Z0000 Encounter for general adult medical examination without abnormal findings: Secondary | ICD-10-CM | POA: Insufficient documentation

## 2023-08-02 NOTE — Assessment & Plan Note (Addendum)
 Chronic swelling of feet and ankles, likely due to amlodipine  use for hypertension control. - Perform heel raises during commercials, 3 sets of 20 daily. - Encourage adequate hydration

## 2023-08-02 NOTE — Assessment & Plan Note (Signed)

## 2023-08-02 NOTE — Assessment & Plan Note (Signed)
 Chronic, thankfully her sx have improved with physical therapy.  She is encouraged to perform stretching exercises regularly.

## 2023-08-02 NOTE — Assessment & Plan Note (Signed)
 AFTER OBTAINING VERBAL CONSENT, LEFT EAR WAS FLUSHED BY IRRIGATION. SHE TOLERATED PROCEDURE WELL WITHOUT ANY COMPLICATIONS. NO TM ABNORMALITIES WERE NOTED.

## 2023-08-02 NOTE — Assessment & Plan Note (Signed)
 Chronic, controlled.  EKG performed, NSR w/ right bundle branch block with left axis -bifascicular block.  - She will continue with propranolol , Benicar , hydralazine , and amlodipine .

## 2023-08-02 NOTE — Assessment & Plan Note (Signed)
Chronic, diabetic foot exam was performed. I DISCUSSED WITH THE PATIENT AT LENGTH REGARDING THE GOALS OF GLYCEMIC CONTROL AND POSSIBLE LONG-TERM COMPLICATIONS.  I  ALSO STRESSED THE IMPORTANCE OF COMPLIANCE WITH HOME GLUCOSE MONITORING, DIETARY RESTRICTIONS INCLUDING AVOIDANCE OF SUGARY DRINKS/PROCESSED FOODS,  ALONG WITH REGULAR EXERCISE.  I  ALSO STRESSED THE IMPORTANCE OF ANNUAL EYE EXAMS, SELF FOOT CARE AND COMPLIANCE WITH OFFICE VISITS.

## 2023-08-02 NOTE — Assessment & Plan Note (Signed)
 BMI 42. She is encouraged to aim for at least 150 minutes of exercise per week. She is reminded chair exercises will count.

## 2023-08-02 NOTE — Assessment & Plan Note (Signed)
 Chronic, she is reminded to avoid NSAIDs, stay hydrated and keep BP/BS controlled to decrease risk of CKD progression. Nephrology input is appreciated. Regarding diabetes, she will continue with Tresiba 16 units nightly and semaglutide 1mg  weekly. She will f/u in four months for re-evaluation.

## 2023-08-10 ENCOUNTER — Other Ambulatory Visit: Payer: Self-pay | Admitting: Internal Medicine

## 2023-08-24 DIAGNOSIS — N184 Chronic kidney disease, stage 4 (severe): Secondary | ICD-10-CM | POA: Diagnosis not present

## 2023-08-28 ENCOUNTER — Ambulatory Visit
Admission: RE | Admit: 2023-08-28 | Discharge: 2023-08-28 | Disposition: A | Discharge status: Home / Self Care | Source: Ambulatory Visit | Attending: Internal Medicine | Admitting: Internal Medicine

## 2023-08-28 ENCOUNTER — Encounter: Payer: Self-pay | Admitting: Advanced Practice Midwife

## 2023-08-28 DIAGNOSIS — Z1231 Encounter for screening mammogram for malignant neoplasm of breast: Secondary | ICD-10-CM

## 2023-08-31 DIAGNOSIS — I129 Hypertensive chronic kidney disease with stage 1 through stage 4 chronic kidney disease, or unspecified chronic kidney disease: Secondary | ICD-10-CM | POA: Diagnosis not present

## 2023-08-31 DIAGNOSIS — E875 Hyperkalemia: Secondary | ICD-10-CM | POA: Diagnosis not present

## 2023-08-31 DIAGNOSIS — N184 Chronic kidney disease, stage 4 (severe): Secondary | ICD-10-CM | POA: Diagnosis not present

## 2023-08-31 DIAGNOSIS — N2581 Secondary hyperparathyroidism of renal origin: Secondary | ICD-10-CM | POA: Diagnosis not present

## 2023-08-31 DIAGNOSIS — E1122 Type 2 diabetes mellitus with diabetic chronic kidney disease: Secondary | ICD-10-CM | POA: Diagnosis not present

## 2023-08-31 DIAGNOSIS — E872 Acidosis, unspecified: Secondary | ICD-10-CM | POA: Diagnosis not present

## 2023-08-31 DIAGNOSIS — D631 Anemia in chronic kidney disease: Secondary | ICD-10-CM | POA: Diagnosis not present

## 2023-08-31 DIAGNOSIS — R609 Edema, unspecified: Secondary | ICD-10-CM | POA: Diagnosis not present

## 2023-08-31 DIAGNOSIS — N189 Chronic kidney disease, unspecified: Secondary | ICD-10-CM | POA: Diagnosis not present

## 2023-09-03 ENCOUNTER — Other Ambulatory Visit: Payer: Self-pay | Admitting: Internal Medicine

## 2023-09-03 MED ORDER — ONETOUCH ULTRA VI STRP: ORAL_STRIP | 4 refills | Status: DC

## 2023-09-03 NOTE — Telephone Encounter (Signed)
 Copied from CRM [id number]. Topic: Clinical - Medication Refill >> Sep 03, 2023  8:57 AM Selinda RAMAN wrote: Medication: AISHA FLING test strip  Has the patient contacted their pharmacy? Yes   This is the patient's preferred pharmacy:  [id number] [address], [place] - [address] [place] [address] Phone: [phone] Fax: [phone]  [facility] - [place], [place] - [address] [place] [address] Phone: [phone] Fax: [phone]  Is this the correct pharmacy for this prescription? Yes If no, delete pharmacy and type the correct one.   Has the prescription been filled recently? No  Is the patient out of the medication? Yes  Has the patient been seen for an appointment in the last year OR does the patient have an upcoming appointment? Yes  Can we respond through MyChart? Yes  Please assist patient further

## 2023-09-08 ENCOUNTER — Telehealth: Payer: Self-pay

## 2023-09-08 NOTE — Telephone Encounter (Signed)
 Reorder request for needles and ozempic  has been sent to provider for signature and will be faxed to [employer] upon return.

## 2023-09-10 ENCOUNTER — Encounter: Payer: Self-pay | Admitting: Pharmacist

## 2023-09-10 NOTE — Progress Notes (Signed)
   09/10/2023  Patient ID: Teresa Stanley, female   DOB: [birth year], 78 y.o.   MRN: [id number]  Received request from [employer] to compelte a Refill/Reorder/Change Form for the Patient so her Novofine needles can be shipped.  The form was completed and and given to PCP for signature.  Completed form will be faxed to [employer] and a copy of the form will be scanned in to the Patient's chart under the media tab.  A1c- Lab Results  Component Value Date   HGBA1C 6.9 (H) 07/22/2023   HGBA1C 6.2 (H) 03/18/2023   HGBA1C 6.5 (H) 11/20/2022   Upcoming appointment:  11/23/23  Med [facility]here[facility]/SUPD review Olmesartan  40 mg filled 07/11/23 90 day supply Atorvastatin  40 mg filled 08/20/23 90 day supply  To keep the paperwork down, the form was completed for Ozempic  1 mg and Tresiba Flextouch as well    Cassius DOROTHA Brought, PharmD, BCACP Clinical Pharmacist [phone]

## 2023-10-06 ENCOUNTER — Other Ambulatory Visit: Payer: Self-pay | Admitting: Internal Medicine

## 2023-10-19 DIAGNOSIS — M5442 Lumbago with sciatica, left side: Secondary | ICD-10-CM | POA: Diagnosis not present

## 2023-10-19 DIAGNOSIS — M5441 Lumbago with sciatica, right side: Secondary | ICD-10-CM | POA: Diagnosis not present

## 2023-10-19 DIAGNOSIS — M4807 Spinal stenosis, lumbosacral region: Secondary | ICD-10-CM | POA: Diagnosis not present

## 2023-10-30 ENCOUNTER — Telehealth: Payer: Self-pay

## 2023-10-30 NOTE — Telephone Encounter (Signed)
 Called patient to inform their  tresiba patient assistance  is ready for pick up, patient aware.  Patient made aware they have 1 week to pick up patient assistance.

## 2023-11-02 ENCOUNTER — Ambulatory Visit: Payer: Self-pay

## 2023-11-02 NOTE — Telephone Encounter (Signed)
 FYI Only or Action Required?: Action required by provider: clinical question for provider and update on patient condition.  Patient was last seen in primary care on 07/22/2023 by Jarold Medici, MD.  Called Nurse Triage reporting Covid Positive.  Symptoms began several days ago.  Interventions attempted: OTC medications: Coricidin and Tylenol  and Rest, hydration, or home remedies.  Symptoms are: gradually improving.  Triage Disposition: Call PCP Within 24 Hours  Patient/caregiver understands and will follow disposition?: Yes                             Copied from CRM #[id number]. Topic: Clinical - Medical Advice >> Nov 02, 2023  9:23 AM Delon HERO wrote: Reason for CRM: Patient is calling to report that she tested postive for COVID on Friday with Cough & congestion only. Has been taking coristedan would like to know what she can take. Reason for Disposition  [1] HIGH RISK patient (e.g., weak immune system, 65 years and older, obesity with BMI 30 or higher, pregnant, chronic lung disease) AND [2] COVID symptoms (e.g., cough, fever)  (Exceptions: Already seen by PCP and no new or worsening symptoms.)  Answer Assessment - Initial Assessment Questions 1. SYMPTOMS: What is your main symptom or concern? (e.g., cough, fever, shortness of breath, muscle aches)     Congestion and cough 2. ONSET: When did the symptoms start?      Last week 3. COUGH: Do you have a cough? If Yes, ask: How bad is the cough?       It's not bad, occasional cough 4. FEVER: Do you have a fever? If Yes, ask: What is your temperature, how was it measured, and when did it start?     Denies 5. BREATHING DIFFICULTY: Are you having any difficulty breathing? (e.g., normal; shortness of breath, wheezing, unable to speak)      Denies 6. BETTER-SAME-WORSE: Are you getting better, staying the same or getting worse compared to yesterday?  If getting worse, ask, In what way?     Slight  improvement  7. OTHER SYMPTOMS: Do you have any other symptoms?  (e.g., chills, fatigue, headache, loss of smell or taste, muscle pain, sore throat)     Denies additional symptoms related to COVID 8. COVID-19 DIAGNOSIS: How do you know that you have COVID? (e.g., positive lab test or self-test, diagnosed by doctor or NP/PA, symptoms after exposure).     At home test on Friday  11. HIGH RISK DISEASE: Do you have any chronic medical problems? (e.g., asthma, heart or lung disease, weak immune system, obesity, etc.)     Diabetes 13. O2 SATURATION MONITOR:  Do you use an oxygen saturation monitor (pulse oximeter) at home? If Yes, ask What is your reading (oxygen level) today? What is your usual oxygen saturation reading? (e.g., 95%)     Denies    Patient would like provider's input on whether or not there are medications she can take in addition to Coricidin and Tylenol  for COVID symptoms. Please advise.  Protocols used: COVID-19 - Diagnosed or Suspected-A-AH

## 2023-11-06 DIAGNOSIS — M5459 Other low back pain: Secondary | ICD-10-CM | POA: Diagnosis not present

## 2023-11-13 DIAGNOSIS — G8929 Other chronic pain: Secondary | ICD-10-CM | POA: Diagnosis not present

## 2023-11-13 DIAGNOSIS — M5416 Radiculopathy, lumbar region: Secondary | ICD-10-CM | POA: Diagnosis not present

## 2023-11-13 DIAGNOSIS — M5459 Other low back pain: Secondary | ICD-10-CM | POA: Diagnosis not present

## 2023-11-13 DIAGNOSIS — M4807 Spinal stenosis, lumbosacral region: Secondary | ICD-10-CM | POA: Diagnosis not present

## 2023-11-13 DIAGNOSIS — M5441 Lumbago with sciatica, right side: Secondary | ICD-10-CM | POA: Diagnosis not present

## 2023-11-13 DIAGNOSIS — M5442 Lumbago with sciatica, left side: Secondary | ICD-10-CM | POA: Diagnosis not present

## 2023-11-16 ENCOUNTER — Other Ambulatory Visit: Payer: Self-pay | Admitting: Internal Medicine

## 2023-11-17 ENCOUNTER — Other Ambulatory Visit: Payer: Self-pay | Admitting: Internal Medicine

## 2023-11-18 ENCOUNTER — Telehealth: Payer: Self-pay

## 2023-11-18 NOTE — Telephone Encounter (Signed)
 PAP: Patient assistance application for Farxiga  through [employer] ([employer]) has been mailed to pt's home address on file. Provider portion of application will be faxed to provider's office.For renewal 2026.

## 2023-11-23 ENCOUNTER — Ambulatory Visit: Admitting: Internal Medicine

## 2023-11-23 ENCOUNTER — Encounter: Payer: Self-pay | Admitting: Internal Medicine

## 2023-11-23 VITALS — BP 134/82 | HR 83 | Temp 98.3°F | Ht 61.0 in | Wt 239.4 lb

## 2023-11-23 DIAGNOSIS — I129 Hypertensive chronic kidney disease with stage 1 through stage 4 chronic kidney disease, or unspecified chronic kidney disease: Secondary | ICD-10-CM

## 2023-11-23 DIAGNOSIS — E1122 Type 2 diabetes mellitus with diabetic chronic kidney disease: Secondary | ICD-10-CM

## 2023-11-23 DIAGNOSIS — Z23 Encounter for immunization: Secondary | ICD-10-CM

## 2023-11-23 DIAGNOSIS — Z6841 Body Mass Index (BMI) 40.0 and over, adult: Secondary | ICD-10-CM

## 2023-11-23 DIAGNOSIS — Z9989 Dependence on other enabling machines and devices: Secondary | ICD-10-CM

## 2023-11-23 DIAGNOSIS — M48062 Spinal stenosis, lumbar region with neurogenic claudication: Secondary | ICD-10-CM | POA: Diagnosis not present

## 2023-11-23 DIAGNOSIS — E66813 Obesity, class 3: Secondary | ICD-10-CM

## 2023-11-23 DIAGNOSIS — N1832 Chronic kidney disease, stage 3b: Secondary | ICD-10-CM

## 2023-11-23 DIAGNOSIS — Z794 Long term (current) use of insulin: Secondary | ICD-10-CM

## 2023-11-23 DIAGNOSIS — E0590 Thyrotoxicosis, unspecified without thyrotoxic crisis or storm: Secondary | ICD-10-CM | POA: Diagnosis not present

## 2023-11-23 NOTE — Assessment & Plan Note (Signed)
 BMI 45.  She has gained 14 lbs since June 2025.  Some of this is likely due to fluid retention.  - Encouraged to incorporate more activity into her daily routine. - perform exercises during commercials of her favorite one hour TV show

## 2023-11-23 NOTE — Assessment & Plan Note (Signed)
 Chronic, currently on methimazole .  - Endo input is appreciated.

## 2023-11-23 NOTE — Progress Notes (Signed)
 I,Victoria T Emmitt, CMA,acting as a Neurosurgeon for Catheryn LOISE Slocumb, MD.,have documented all relevant documentation on the behalf of Catheryn LOISE Slocumb, MD,as directed by  Catheryn LOISE Slocumb, MD while in the presence of Catheryn LOISE Slocumb, MD.  Subjective:  Patient ID: Teresa Stanley , female    DOB: [birth year] , 78 y.o.   MRN: [id number]  Chief Complaint  Patient presents with   Diabetes    Patient presents today for dm, bp & thyroid  follow up. She reports compliance with medications. Denies headache, chest pain & sob. She states [facility]. Diagnosed with spinal stenosis. She has completed MRI. She is waiting for a call back to receive injections.    Hypertension   Hyperthyroidism    HPI Discussed the use of AI scribe software for clinical note transcription with the patient, who gave verbal consent to proceed.  History of Present Illness Teresa Stanley is a 78 year old female with diabetes, hypertension, and spinal stenosis who presents with chronic back pain. She is accompanied by her daughter today.   She experiences chronic back pain due to spinal stenosis, confirmed by an MRI two weeks ago. The pain is managed with gabapentin  taken at bedtime, which provides some relief, though the intensity varies.  Her medication regimen includes amlodipine  10 mg daily, aspirin , lactobastatin 40 mg daily, chlorthalidone, Farxiga  10 mg, famotidine  10 mg, gabapentin  at bedtime, Tresiba 16 units, hydralazine  twice a day, methimazole , olmesartan  40 mg, and sodium bicarbonate.  No current heartburn and no recent episodes of nausea, vomiting, or diarrhea. She mentions occasional swelling in her ankles, which subsides at night.  She has a history of fluctuating kidney function, alternating between stage 3B and stage 4.   Diabetes She presents for her follow-up diabetic visit. She has type 2 diabetes mellitus. Her disease course has been stable. There are no hypoglycemic associated symptoms.  Pertinent negatives for diabetes include no blurred vision and no chest pain. There are no hypoglycemic complications. Diabetic complications include nephropathy. Risk factors for coronary artery disease include diabetes mellitus, dyslipidemia, obesity, post-menopausal and sedentary lifestyle. She is compliant with treatment some of the time. Her weight is decreasing steadily. She is following a generally healthy diet. She participates in exercise intermittently. Her breakfast blood glucose is taken between 8-9 am. Her breakfast blood glucose range is generally 130-140 mg/dl. An ACE inhibitor/angiotensin II receptor blocker is being taken. Eye exam is current.  Hypertension The current episode started more than 1 year ago. The problem has been gradually improving since onset. The problem is uncontrolled. Pertinent negatives include no blurred vision, chest pain, palpitations or shortness of breath. The current treatment provides moderate improvement. Compliance problems include exercise.  Hypertensive end-organ damage includes kidney disease. Identifiable causes of hypertension include chronic renal disease.  Hyperlipidemia This is a chronic problem. The problem is uncontrolled. Exacerbating diseases include chronic renal disease, diabetes and obesity. Pertinent negatives include no chest pain or shortness of breath. Current antihyperlipidemic treatment includes statins. The current treatment provides moderate improvement of lipids. Compliance problems include adherence to exercise.  Risk factors for coronary artery disease include diabetes mellitus, dyslipidemia, hypertension, post-menopausal, a sedentary lifestyle and obesity.     Past Medical History:  Diagnosis Date   Arthritis    Diabetes mellitus    takes Actos  and Metformin  daily and Victoza    GERD (gastroesophageal reflux disease)    takes Omeprazole daily   Headache(784.0)    occasionally   Hyperlipidemia  takes Atorvastatin  daily    Hypertension    takes Lisinopril ,Amlodipine ,and Metoprolol  daily   Hypothyroidism    Joint pain    Thyroid  disease    ?     Family History  Problem Relation Age of Onset   Diabetes Mother    Hypertension Mother    Diabetes Father    Asthma Father    Hypertension Father    Breast cancer Sister 70   Hypertension Sister        2   Hypertension Sister    Diabetes Sister    Hypertension Brother        5   Kidney disease Brother    Hypertension Daughter    Colon cancer Neg Hx    Esophageal cancer Neg Hx    Rectal cancer Neg Hx    Stomach cancer Neg Hx      Current Outpatient Medications:    amLODipine  (NORVASC ) 10 MG tablet, TAKE 1 TABLET BY MOUTH EVERY DAY, Disp: 90 tablet, Rfl: 2   aspirin  81 MG tablet, Take 81 mg by mouth daily., Disp: , Rfl:    atorvastatin  (LIPITOR) 40 MG tablet, TAKE 1 TABLET BY MOUTH EVERY DAY, Disp: 90 tablet, Rfl: 2   BD PEN NEEDLE NANO 2ND GEN 32G X 4 MM MISC, USE WITH TRESIBA, Disp: 100 each, Rfl: 10   Blood Glucose Calibration (OT ULTRA/FASTTK CNTRL SOLN) SOLN, , Disp: , Rfl:    Blood Glucose Monitoring Suppl (ONE TOUCH ULTRA 2) w/Device KIT, Use as directed to check blood sugars 2 times per day dx:e11.22, Disp: 1 each, Rfl: 1   carboxymethylcellulose (REFRESH PLUS) 0.5 % SOLN, Place 1 drop into both eyes daily., Disp: , Rfl:    chlorthalidone (HYGROTON) 25 MG tablet, Take 25 mg by mouth daily., Disp: , Rfl:    cholecalciferol (VITAMIN D3) 25 MCG (1000 UNIT) tablet, Take 1,000 Units by mouth daily., Disp: , Rfl:    dapagliflozin  propanediol (FARXIGA ) 10 MG TABS tablet, Take 1 tablet (10 mg total) by mouth daily before breakfast., Disp: 90 tablet, Rfl: 3   famotidine  (HM FAMOTIDINE ) 10 MG tablet, Take 1 tablet (10 mg total) by mouth daily., Disp: 90 tablet, Rfl: 1   gabapentin  (NEURONTIN ) 300 MG capsule, TAKE 1 CAPSULE BY MOUTH EVERYDAY AT BEDTIME, Disp: 90 capsule, Rfl: 2   glucose blood (ONETOUCH ULTRA) test strip, Use as instructed, Disp: 200  strip, Rfl: 4   hydrALAZINE  (APRESOLINE ) 25 MG tablet, TAKE 1 TABLET BY MOUTH TWICE A DAY, Disp: 60 tablet, Rfl: 1   insulin  degludec (TRESIBA FLEXTOUCH) 100 UNIT/ML SOPN FlexTouch Pen, Inject 16 Units into the skin at bedtime. , Disp: , Rfl:    lidocaine  (LIDODERM ) 5 %, Place 1 patch onto the skin daily. Remove & Discard patch within 12 hours or as directed by MD, Disp: 30 patch, Rfl: 2   meclizine  (ANTIVERT ) 25 MG tablet, TAKE 1 TABLET (25 MG TOTAL) BY MOUTH 3 (THREE) TIMES DAILY AS NEEDED FOR DIZZINESS., Disp: 30 tablet, Rfl: 0   methimazole  (TAPAZOLE ) 5 MG tablet, TAKE 1 TABLET (5 MG TOTAL) BY MOUTH DAILY., Disp: 90 tablet, Rfl: 1   olmesartan  (BENICAR ) 40 MG tablet, Take 1 tablet (40 mg total) by mouth daily., Disp: 90 tablet, Rfl: 2   OneTouch Delica Lancets 33G MISC, Use as directed to check blood sugars 2 times per day dx: e11.22, Disp: 300 each, Rfl: 2   oxyCODONE -acetaminophen  (PERCOCET) 5-325 MG tablet, Take 1 tablet by mouth every 4 (four) hours as  needed for severe pain (pain score 7-10)., Disp: 20 tablet, Rfl: 0   propranolol  ER (INDERAL  LA) 120 MG 24 hr capsule, TAKE 1 CAPSULE BY MOUTH EVERY DAY, Disp: 90 capsule, Rfl: 2   Semaglutide , 1 MG/DOSE, 4 MG/3ML SOPN, Inject 1 mg into the skin once a week., Disp: 3 mL, Rfl: 2   sodium bicarbonate 650 MG tablet, TAKE 1 TABLET BY MOUTH EVERY DAY, Disp: 90 tablet, Rfl: 3   triamcinolone  cream (KENALOG ) 0.1 %, Apply 1 application topically 2 (two) times daily. Apply, Disp: 45 g, Rfl: 1   No Known Allergies   Review of Systems  Constitutional: Negative.   Eyes:  Negative for blurred vision.  Respiratory: Negative.  Negative for shortness of breath.   Cardiovascular: Negative.  Negative for chest pain and palpitations.  Gastrointestinal: Negative.   Musculoskeletal:  Positive for back pain.  Neurological: Negative.   Psychiatric/Behavioral: Negative.       Today's Vitals   11/23/23 1429  BP: 134/82  Pulse: 83  Temp: 98.3 F (36.8 C)   SpO2: 98%  Weight: 239 lb 6.4 oz (108.6 kg)  Height: 5' 1 (1.549 m)   Body mass index is 45.23 kg/m.  Wt Readings from Last 3 Encounters:  11/23/23 239 lb 6.4 oz (108.6 kg)  07/22/23 225 lb 3.2 oz (102.2 kg)  04/20/23 222 lb 9.6 oz (101 kg)     Objective:  Physical Exam Vitals and nursing note reviewed.  Constitutional:      Appearance: Normal appearance. She is obese.  HENT:     Head: Normocephalic and atraumatic.  Eyes:     Extraocular Movements: Extraocular movements intact.  Cardiovascular:     Rate and Rhythm: Normal rate and regular rhythm.     Heart sounds: Normal heart sounds.  Pulmonary:     Effort: Pulmonary effort is normal.     Breath sounds: Normal breath sounds.  Musculoskeletal:     Cervical back: Normal range of motion.     Right lower leg: Edema present.     Left lower leg: Edema present.     Comments: Ambulatory with cane  Skin:    General: Skin is warm.  Neurological:     General: No focal deficit present.     Mental Status: She is alert.  Psychiatric:        Mood and Affect: Mood normal.        Behavior: Behavior normal.          Assessment And Plan:  Type 2 diabetes mellitus with stage 3b chronic kidney disease, with long-term current use of insulin  (HCC) Assessment & Plan: Type 2 diabetes with diabetic CKD, kidney function fluctuates between stage 3B and 4, recent readings suggest stage 3B. Blood glucose may spike post-spinal steroid injection. - Monitor blood glucose closely post-spinal injection. - Adjust [place] dosage as needed post-injection. - Ensure adequate hydration. - Stop Farxiga , chlorthalidone, and olmesartan  if dehydration occurs.  Orders: -     CMP14+EGFR -     Hemoglobin A1c  Parenchymal renal hypertension, stage 1 through stage 4 or unspecified chronic kidney disease Assessment & Plan: Chronic, fair control.  Goal BP<130/80.   - She will continue with propranolol , Benicar , hydralazine , and amlodipine . - Follow low  sodium diet.   Orders: -     CMP14+EGFR  Hyperthyroidism Assessment & Plan: Chronic, currently on methimazole .  - Endo input is appreciated.    Spinal stenosis of lumbar region with neurogenic claudication Assessment & Plan: Chronic back  pain due to lumbar spinal stenosis, prefers non-surgical management. Gabapentin  used for pain, potential to increase dosing. Awaiting spinal injection scheduling. Informed of potential post-injection glucose rise. - Increase gabapentin  to twice daily, monitor tolerance and effectiveness. - Schedule spinal injection as per Ortho, monitor blood glucose post-procedure. - Encourage regular stretching exercises. - Consider Dr. Joshua consultation if surgery becomes necessary.   Class 3 severe obesity due to excess calories with serious comorbidity and body mass index (BMI) of 40.0 to 44.9 in adult [facility]) Assessment & Plan: BMI 45.  She has gained 14 lbs since June 2025.  Some of this is likely due to fluid retention.  - Encouraged to incorporate more activity into her daily routine. - perform exercises during commercials of her favorite one hour TV show   Immunization due -     Flu vaccine HIGH DOSE PF(Fluzone Trivalent)  Dependence on cane   Return for 4 month dm f/u.SABRA  Patient was given opportunity to ask questions. Patient verbalized understanding of the plan and was able to repeat key elements of the plan. All questions were answered to their satisfaction.   I, Catheryn LOISE Slocumb, MD, have reviewed all documentation for this visit. The documentation on 11/23/23 for the exam, diagnosis, procedures, and orders are all accurate and complete.   IF YOU HAVE BEEN REFERRED TO A SPECIALIST, IT MAY TAKE 1-2 WEEKS TO SCHEDULE/PROCESS THE REFERRAL. IF YOU HAVE NOT HEARD FROM US /SPECIALIST IN TWO WEEKS, PLEASE GIVE US  A CALL AT [phone].   THE PATIENT IS ENCOURAGED TO PRACTICE SOCIAL DISTANCING DUE TO THE COVID-19 PANDEMIC.

## 2023-11-23 NOTE — Assessment & Plan Note (Signed)
 Chronic, fair control.  Goal BP<130/80.   - She will continue with propranolol , Benicar , hydralazine , and amlodipine . - Follow low sodium diet.

## 2023-11-23 NOTE — Assessment & Plan Note (Signed)
 Type 2 diabetes with diabetic CKD, kidney function fluctuates between stage 3B and 4, recent readings suggest stage 3B. Blood glucose may spike post-spinal steroid injection. - Monitor blood glucose closely post-spinal injection. - Adjust [place] dosage as needed post-injection. - Ensure adequate hydration. - Stop Farxiga , chlorthalidone, and olmesartan  if dehydration occurs.

## 2023-11-23 NOTE — Patient Instructions (Signed)

## 2023-11-23 NOTE — Assessment & Plan Note (Signed)
 Chronic back pain due to lumbar spinal stenosis, prefers non-surgical management. Gabapentin  used for pain, potential to increase dosing. Awaiting spinal injection scheduling. Informed of potential post-injection glucose rise. - Increase gabapentin  to twice daily, monitor tolerance and effectiveness. - Schedule spinal injection as per Ortho, monitor blood glucose post-procedure. - Encourage regular stretching exercises. - Consider Dr. Joshua consultation if surgery becomes necessary.

## 2023-11-24 LAB — CMP14+EGFR
ALT: 11 IU/L (ref 0–32)
AST: 18 IU/L (ref 0–40)
Albumin: 4.1 g/dL (ref 3.8–4.8)
Alkaline Phosphatase: 119 IU/L (ref 49–135)
BUN/Creatinine Ratio: 21 (ref 12–28)
BUN: 38 mg/dL — ABNORMAL HIGH (ref 8–27)
Bilirubin Total: 0.4 mg/dL (ref 0.0–1.2)
CO2: 22 mmol/L (ref 20–29)
Calcium: 9.5 mg/dL (ref 8.7–10.3)
Chloride: 100 mmol/L (ref 96–106)
Creatinine, Ser: 1.85 mg/dL — ABNORMAL HIGH (ref 0.57–1.00)
Globulin, Total: 2.9 g/dL (ref 1.5–4.5)
Glucose: 74 mg/dL (ref 70–99)
Potassium: 5.1 mmol/L (ref 3.5–5.2)
Sodium: 138 mmol/L (ref 134–144)
Total Protein: 7 g/dL (ref 6.0–8.5)
eGFR: 28 mL/min/1.73 — ABNORMAL LOW (ref >59)

## 2023-11-24 LAB — HEMOGLOBIN A1C
Est. average glucose Bld gHb Est-mCnc: 151 mg/dL
Hgb A1c MFr Bld: 6.9 % — ABNORMAL HIGH (ref 4.8–5.6)

## 2023-11-25 ENCOUNTER — Ambulatory Visit: Payer: Self-pay | Admitting: Internal Medicine

## 2023-11-26 ENCOUNTER — Other Ambulatory Visit (HOSPITAL_COMMUNITY): Payer: Self-pay

## 2023-11-26 NOTE — Telephone Encounter (Signed)
 Received provider portion [employer] Farxiga  Pap application.

## 2023-11-30 ENCOUNTER — Other Ambulatory Visit: Payer: Self-pay

## 2023-11-30 DIAGNOSIS — N1832 Chronic kidney disease, stage 3b: Secondary | ICD-10-CM

## 2023-11-30 MED ORDER — ACCU-CHEK GUIDE TEST VI STRP: ORAL_STRIP | 12 refills | Status: AC

## 2023-11-30 MED ORDER — ACCU-CHEK SOFTCLIX LANCETS MISC: 2 refills | Status: AC

## 2023-11-30 MED ORDER — ACCU-CHEK GUIDE ME W/DEVICE KIT: PACK | 1 refills | Status: AC

## 2023-12-02 ENCOUNTER — Telehealth: Payer: Self-pay

## 2023-12-02 ENCOUNTER — Encounter: Payer: Self-pay | Admitting: Pharmacist

## 2023-12-02 NOTE — Progress Notes (Signed)
   12/02/2023  Patient ID: Teresa Stanley, female   DOB: [birth year], 78 y.o.   MRN: [id number]  Obtained Physician signature and faxed [employer] Patient assistance application to Luke Cecille Sola for [place] and Novofine needles.  Lab Results  Component Value Date   HGBA1C 6.9 (H) 11/23/2023   HGBA1C 6.9 (H) 07/22/2023   HGBA1C 6.2 (H) 03/18/2023  Farxiga  10 mg (Patient assistance renewal done earlier) Tresiba 16 units at bedtime  Lipid Panel     Component Value Date/Time   CHOL 156 07/22/2023 1515   TRIG 122 07/22/2023 1515   HDL 61 07/22/2023 1515   CHOLHDL 2.6 07/22/2023 1515   CHOLHDL 2.4 Ratio 01/29/2010 2039   VLDL 15 01/29/2010 2039   LDLCALC 74 07/22/2023 1515   LABVLDL 21 07/22/2023 1515   On statin-Atorvastatin  40 mg lf 11/20/23 #90     11/23/2023    2:29 PM 07/22/2023    2:14 PM 06/05/2023    9:14 AM  Vitals with BMI  Height 5' 1 5' 1 --  Weight 239 lbs 6 oz 225 lbs 3 oz --  BMI 45.26 42.57   Systolic 134 130   Diastolic 82 70   Pulse 83 83    On amlodipine  10 gm lf 10/07/23 #90 Not on ACE/ARB urine creatinine 45   Teresa Stanley Brought, PharmD, [facility] Clinical Pharmacist [phone]

## 2023-12-02 NOTE — Telephone Encounter (Signed)
 PAP: Patient assistance application for Tresiba  through [employer] has been mailed to pt's home address on file. Provider portion of application will be faxed to provider's office. E-filed patient portion.

## 2023-12-03 NOTE — Telephone Encounter (Signed)
 PAP: Application for Marcelline Deist has been submitted to [employer] ([employer]), via fax

## 2023-12-08 NOTE — Telephone Encounter (Signed)
 PAP: Patient assistance application for Farxiga  has been approved by PAP Companies: [employer] from 02/11/2024 to 02/09/2025. Medication should be delivered to PAP Delivery: Home. For further shipping updates, please contact [employer] ([employer]) at [phone]. Patient ID is: letter in media

## 2023-12-16 ENCOUNTER — Telehealth: Payer: Self-pay

## 2023-12-16 NOTE — Telephone Encounter (Signed)
 Completed refill form for Ozempic  Novo Nordisk and faxed to Provider for review and signature.

## 2024-01-02 ENCOUNTER — Other Ambulatory Visit: Payer: Self-pay | Admitting: Internal Medicine

## 2024-01-13 ENCOUNTER — Encounter: Payer: Self-pay | Admitting: Pharmacist

## 2024-01-13 NOTE — Progress Notes (Signed)
   01/13/2024  Patient ID: Teresa Stanley, female   DOB: 22-Dec-1945, 78 y.o.   MRN: [id number]  Obtained Provider signatures and faxed completed application back to Luke Mall, CPhT for [place] to Novo Nordisk for 2026 renewal.   Lab Results  Component Value Date   HGBA1C 6.9 (H) 11/23/2023   HGBA1C 6.9 (H) 07/22/2023   HGBA1C 6.2 (H) 03/18/2023     Cassius DOROTHA Brought, PharmD, BCACP Clinical Pharmacist [phone]

## 2024-01-14 ENCOUNTER — Other Ambulatory Visit (HOSPITAL_COMMUNITY): Payer: Self-pay

## 2024-01-14 NOTE — Telephone Encounter (Signed)
 PAP: Application for Evaristo Bury has been submitted to [employer], via fax

## 2024-01-19 NOTE — Telephone Encounter (Signed)
 PAP: Patient assistance application for [place] has been approved by PAP Companies: [employer] from 02/11/2024 to 02/09/2025. Medication should be delivered to PAP Delivery: Provider's office. For further shipping updates, please contact [employer] at [phone]. Patient ID is: [id number]

## 2024-03-14 ENCOUNTER — Encounter: Payer: Self-pay | Admitting: Internal Medicine

## 2024-03-15 ENCOUNTER — Ambulatory Visit: Admitting: Internal Medicine

## 2024-03-15 ENCOUNTER — Encounter: Payer: Self-pay | Admitting: Internal Medicine

## 2024-03-15 VITALS — BP 126/78 | HR 85 | Temp 98.1°F | Ht 61.0 in | Wt 244.6 lb

## 2024-03-15 DIAGNOSIS — Z6841 Body Mass Index (BMI) 40.0 and over, adult: Secondary | ICD-10-CM

## 2024-03-15 DIAGNOSIS — Z794 Long term (current) use of insulin: Secondary | ICD-10-CM

## 2024-03-15 DIAGNOSIS — E0590 Thyrotoxicosis, unspecified without thyrotoxic crisis or storm: Secondary | ICD-10-CM

## 2024-03-15 DIAGNOSIS — I129 Hypertensive chronic kidney disease with stage 1 through stage 4 chronic kidney disease, or unspecified chronic kidney disease: Secondary | ICD-10-CM

## 2024-03-15 LAB — POCT URINALYSIS DIPSTICK
Bilirubin, UA: NEGATIVE
Blood, UA: NEGATIVE
Glucose, UA: POSITIVE — AB
Ketones, UA: NEGATIVE
Nitrite, UA: NEGATIVE
Protein, UA: POSITIVE — AB
Spec Grav, UA: 1.015
Urobilinogen, UA: 0.2 U/dL
pH, UA: 7

## 2024-03-15 MED ORDER — FLUCONAZOLE 150 MG PO TABS: ORAL_TABLET | ORAL | 1 refills | Status: AC

## 2024-03-15 NOTE — Patient Instructions (Signed)

## 2024-03-16 LAB — HEMOGLOBIN A1C
Est. average glucose Bld gHb Est-mCnc: 131 mg/dL
Hgb A1c MFr Bld: 6.2 % — ABNORMAL HIGH (ref 4.8–5.6)

## 2024-03-17 ENCOUNTER — Ambulatory Visit: Payer: Self-pay | Admitting: Internal Medicine

## 2024-03-28 ENCOUNTER — Ambulatory Visit: Payer: Self-pay | Admitting: Internal Medicine

## 2024-07-25 ENCOUNTER — Encounter: Payer: Self-pay | Admitting: Internal Medicine
